# Patient Record
Sex: Female | Born: 1935 | Race: Black or African American | Hispanic: No | Smoking: Never smoker
Health system: Southern US, Community
[De-identification: ages and names within clinical notes are randomized; demographics above are authoritative.]

## PROBLEM LIST (undated history)

## (undated) DIAGNOSIS — M1990 Unspecified osteoarthritis, unspecified site: Secondary | ICD-10-CM

## (undated) DIAGNOSIS — I10 Essential (primary) hypertension: Secondary | ICD-10-CM

## (undated) DIAGNOSIS — F0390 Unspecified dementia without behavioral disturbance: Secondary | ICD-10-CM

## (undated) DIAGNOSIS — N189 Chronic kidney disease, unspecified: Secondary | ICD-10-CM

## (undated) DIAGNOSIS — D649 Anemia, unspecified: Secondary | ICD-10-CM

## (undated) DIAGNOSIS — E785 Hyperlipidemia, unspecified: Secondary | ICD-10-CM

## (undated) DIAGNOSIS — I480 Paroxysmal atrial fibrillation: Secondary | ICD-10-CM

## (undated) DIAGNOSIS — R001 Bradycardia, unspecified: Secondary | ICD-10-CM

## (undated) DIAGNOSIS — S82899A Other fracture of unspecified lower leg, initial encounter for closed fracture: Secondary | ICD-10-CM

## (undated) HISTORY — DX: Hyperlipidemia, unspecified: E78.5

## (undated) HISTORY — DX: Bradycardia, unspecified: R00.1

## (undated) HISTORY — PX: TONSILLECTOMY AND ADENOIDECTOMY: SUR1326

## (undated) HISTORY — DX: Anemia, unspecified: D64.9

## (undated) HISTORY — DX: Paroxysmal atrial fibrillation: I48.0

## (undated) HISTORY — DX: Essential (primary) hypertension: I10

## (undated) HISTORY — PX: HEMORRHOID SURGERY: SHX153

## (undated) HISTORY — DX: Unspecified dementia, unspecified severity, without behavioral disturbance, psychotic disturbance, mood disturbance, and anxiety: F03.90

## (undated) HISTORY — DX: Other fracture of unspecified lower leg, initial encounter for closed fracture: S82.899A

## (undated) HISTORY — DX: Chronic kidney disease, unspecified: N18.9

## (undated) HISTORY — PX: ABDOMINAL HYSTERECTOMY: SHX81

## (undated) HISTORY — PX: FRACTURE SURGERY: SHX138

---

## 2000-09-14 HISTORY — PX: EYE SURGERY: SHX253

## 2006-03-31 ENCOUNTER — Inpatient Hospital Stay (HOSPITAL_COMMUNITY): Admission: AD | Admit: 2006-03-31 | Discharge: 2006-04-05 | Payer: Self-pay | Admitting: Internal Medicine

## 2006-05-22 ENCOUNTER — Ambulatory Visit: Payer: Self-pay | Admitting: Internal Medicine

## 2006-05-22 ENCOUNTER — Inpatient Hospital Stay (HOSPITAL_COMMUNITY): Admission: EM | Admit: 2006-05-22 | Discharge: 2006-06-02 | Payer: Self-pay | Admitting: Emergency Medicine

## 2006-05-23 ENCOUNTER — Encounter: Payer: Self-pay | Admitting: Cardiology

## 2006-05-26 ENCOUNTER — Encounter: Payer: Self-pay | Admitting: Cardiology

## 2006-06-14 ENCOUNTER — Ambulatory Visit: Payer: Self-pay | Admitting: Cardiology

## 2006-06-17 ENCOUNTER — Ambulatory Visit: Payer: Self-pay

## 2006-06-23 ENCOUNTER — Ambulatory Visit: Payer: Self-pay | Admitting: Cardiology

## 2006-07-06 ENCOUNTER — Ambulatory Visit: Payer: Self-pay | Admitting: Internal Medicine

## 2006-07-06 ENCOUNTER — Ambulatory Visit: Payer: Self-pay | Admitting: Cardiology

## 2006-07-14 ENCOUNTER — Ambulatory Visit: Payer: Self-pay | Admitting: Cardiology

## 2006-07-28 ENCOUNTER — Ambulatory Visit: Payer: Self-pay | Admitting: Cardiology

## 2006-08-04 ENCOUNTER — Ambulatory Visit: Payer: Self-pay | Admitting: Cardiology

## 2006-08-12 ENCOUNTER — Ambulatory Visit: Payer: Self-pay | Admitting: Internal Medicine

## 2006-08-18 ENCOUNTER — Ambulatory Visit: Payer: Self-pay | Admitting: Cardiology

## 2006-08-18 LAB — CONVERTED CEMR LAB
BUN: 22 mg/dL (ref 6–23)
CO2: 34 meq/L — ABNORMAL HIGH (ref 19–32)
Calcium: 10 mg/dL (ref 8.4–10.5)
Chloride: 104 meq/L (ref 96–112)
Creatinine, Ser: 1.2 mg/dL (ref 0.4–1.2)
GFR calc Af Amer: 56 mL/min
GFR calc non Af Amer: 46 mL/min
Glucose, Bld: 85 mg/dL (ref 70–99)
Potassium: 3.5 meq/L (ref 3.5–5.1)
Sodium: 143 meq/L (ref 135–145)

## 2006-08-26 ENCOUNTER — Ambulatory Visit: Payer: Self-pay | Admitting: Cardiology

## 2006-09-15 ENCOUNTER — Ambulatory Visit: Payer: Self-pay | Admitting: *Deleted

## 2006-10-13 ENCOUNTER — Ambulatory Visit: Payer: Self-pay | Admitting: Internal Medicine

## 2006-10-27 ENCOUNTER — Ambulatory Visit: Payer: Self-pay | Admitting: *Deleted

## 2006-11-04 ENCOUNTER — Ambulatory Visit: Payer: Self-pay | Admitting: Cardiology

## 2006-11-17 ENCOUNTER — Ambulatory Visit: Payer: Self-pay | Admitting: *Deleted

## 2006-12-15 ENCOUNTER — Ambulatory Visit: Payer: Self-pay | Admitting: Cardiovascular Disease

## 2007-01-12 ENCOUNTER — Ambulatory Visit: Payer: Self-pay | Admitting: Internal Medicine

## 2007-02-09 ENCOUNTER — Ambulatory Visit: Payer: Self-pay | Admitting: Cardiology

## 2007-03-09 ENCOUNTER — Ambulatory Visit: Payer: Self-pay | Admitting: Internal Medicine

## 2007-04-04 ENCOUNTER — Ambulatory Visit: Payer: Self-pay | Admitting: Cardiology

## 2007-04-04 ENCOUNTER — Ambulatory Visit: Payer: Self-pay | Admitting: Internal Medicine

## 2007-05-04 ENCOUNTER — Ambulatory Visit: Payer: Self-pay | Admitting: Cardiology

## 2007-05-18 ENCOUNTER — Ambulatory Visit: Payer: Self-pay | Admitting: Internal Medicine

## 2007-05-31 ENCOUNTER — Ambulatory Visit: Payer: Self-pay | Admitting: Cardiology

## 2007-06-28 ENCOUNTER — Ambulatory Visit: Payer: Self-pay | Admitting: Cardiology

## 2007-07-26 ENCOUNTER — Ambulatory Visit: Payer: Self-pay | Admitting: Cardiology

## 2007-07-26 ENCOUNTER — Ambulatory Visit: Payer: Self-pay | Admitting: Cardiovascular Disease

## 2007-08-16 ENCOUNTER — Ambulatory Visit: Payer: Self-pay | Admitting: Cardiology

## 2007-09-13 ENCOUNTER — Ambulatory Visit: Payer: Self-pay | Admitting: Internal Medicine

## 2007-09-29 ENCOUNTER — Ambulatory Visit: Payer: Self-pay | Admitting: Internal Medicine

## 2007-09-29 LAB — CONVERTED CEMR LAB
BUN: 41 mg/dL — ABNORMAL HIGH (ref 6–23)
CO2: 29 meq/L (ref 19–32)
Calcium: 9.8 mg/dL (ref 8.4–10.5)
Chloride: 102 meq/L (ref 96–112)
Creatinine, Ser: 1.4 mg/dL — ABNORMAL HIGH (ref 0.4–1.2)
GFR calc Af Amer: 46 mL/min
GFR calc non Af Amer: 38 mL/min
Glucose, Bld: 85 mg/dL (ref 70–99)
Potassium: 4 meq/L (ref 3.5–5.1)
Sodium: 140 meq/L (ref 135–145)

## 2007-10-14 ENCOUNTER — Ambulatory Visit: Payer: Self-pay | Admitting: Cardiology

## 2007-11-07 ENCOUNTER — Ambulatory Visit: Payer: Self-pay | Admitting: Cardiology

## 2007-12-05 ENCOUNTER — Ambulatory Visit: Payer: Self-pay | Admitting: Cardiology

## 2007-12-28 DIAGNOSIS — I495 Sick sinus syndrome: Secondary | ICD-10-CM

## 2007-12-28 DIAGNOSIS — F0390 Unspecified dementia without behavioral disturbance: Secondary | ICD-10-CM | POA: Insufficient documentation

## 2007-12-28 DIAGNOSIS — I10 Essential (primary) hypertension: Secondary | ICD-10-CM | POA: Insufficient documentation

## 2007-12-28 DIAGNOSIS — E785 Hyperlipidemia, unspecified: Secondary | ICD-10-CM | POA: Insufficient documentation

## 2007-12-28 DIAGNOSIS — I4891 Unspecified atrial fibrillation: Secondary | ICD-10-CM | POA: Insufficient documentation

## 2007-12-28 DIAGNOSIS — D649 Anemia, unspecified: Secondary | ICD-10-CM | POA: Insufficient documentation

## 2007-12-28 DIAGNOSIS — H409 Unspecified glaucoma: Secondary | ICD-10-CM | POA: Insufficient documentation

## 2007-12-28 DIAGNOSIS — I509 Heart failure, unspecified: Secondary | ICD-10-CM | POA: Insufficient documentation

## 2007-12-28 DIAGNOSIS — I429 Cardiomyopathy, unspecified: Secondary | ICD-10-CM | POA: Insufficient documentation

## 2007-12-28 HISTORY — DX: Sick sinus syndrome: I49.5

## 2007-12-28 HISTORY — DX: Heart failure, unspecified: I50.9

## 2008-01-03 ENCOUNTER — Ambulatory Visit: Payer: Self-pay | Admitting: Cardiology

## 2008-01-17 ENCOUNTER — Ambulatory Visit: Payer: Self-pay | Admitting: Cardiology

## 2008-02-08 ENCOUNTER — Ambulatory Visit: Payer: Self-pay | Admitting: Cardiology

## 2008-03-07 ENCOUNTER — Ambulatory Visit: Payer: Self-pay | Admitting: Internal Medicine

## 2008-03-22 ENCOUNTER — Ambulatory Visit: Payer: Self-pay

## 2008-03-22 ENCOUNTER — Ambulatory Visit: Payer: Self-pay | Admitting: Cardiology

## 2008-03-22 ENCOUNTER — Encounter: Payer: Self-pay | Admitting: Cardiology

## 2008-04-18 ENCOUNTER — Ambulatory Visit: Payer: Self-pay | Admitting: Cardiology

## 2008-05-17 ENCOUNTER — Ambulatory Visit: Payer: Self-pay | Admitting: Internal Medicine

## 2008-05-17 ENCOUNTER — Ambulatory Visit: Payer: Self-pay | Admitting: Cardiology

## 2008-05-17 LAB — CONVERTED CEMR LAB
BUN: 63 mg/dL — ABNORMAL HIGH (ref 6–23)
CO2: 28 meq/L (ref 19–32)
Calcium: 9.8 mg/dL (ref 8.4–10.5)
Chloride: 107 meq/L (ref 96–112)
Creatinine, Ser: 2.3 mg/dL — ABNORMAL HIGH (ref 0.4–1.2)
GFR calc Af Amer: 26 mL/min
GFR calc non Af Amer: 22 mL/min
Glucose, Bld: 115 mg/dL — ABNORMAL HIGH (ref 70–99)
Potassium: 5.4 meq/L — ABNORMAL HIGH (ref 3.5–5.1)
Sodium: 140 meq/L (ref 135–145)

## 2008-05-31 ENCOUNTER — Ambulatory Visit: Payer: Self-pay | Admitting: Cardiology

## 2008-05-31 LAB — CONVERTED CEMR LAB
BUN: 38 mg/dL — ABNORMAL HIGH (ref 6–23)
CO2: 30 meq/L (ref 19–32)
Calcium: 10 mg/dL (ref 8.4–10.5)
Chloride: 104 meq/L (ref 96–112)
Creatinine, Ser: 1.8 mg/dL — ABNORMAL HIGH (ref 0.4–1.2)
GFR calc Af Amer: 35 mL/min
GFR calc non Af Amer: 29 mL/min
Glucose, Bld: 138 mg/dL — ABNORMAL HIGH (ref 70–99)
Potassium: 4.5 meq/L (ref 3.5–5.1)
Sodium: 141 meq/L (ref 135–145)

## 2008-06-21 ENCOUNTER — Ambulatory Visit: Payer: Self-pay | Admitting: Cardiology

## 2008-07-05 ENCOUNTER — Ambulatory Visit: Payer: Self-pay | Admitting: Cardiology

## 2008-07-19 ENCOUNTER — Ambulatory Visit: Payer: Self-pay | Admitting: Cardiology

## 2008-08-16 ENCOUNTER — Ambulatory Visit: Payer: Self-pay | Admitting: Cardiology

## 2008-09-12 ENCOUNTER — Ambulatory Visit: Payer: Self-pay | Admitting: Internal Medicine

## 2008-10-03 ENCOUNTER — Ambulatory Visit: Payer: Self-pay | Admitting: Internal Medicine

## 2008-10-30 ENCOUNTER — Ambulatory Visit: Payer: Self-pay | Admitting: Cardiology

## 2008-10-30 LAB — CONVERTED CEMR LAB
ALT: 23 units/L (ref 0–35)
AST: 21 units/L (ref 0–37)
Albumin: 4.2 g/dL (ref 3.5–5.2)
Alkaline Phosphatase: 54 units/L (ref 39–117)
BUN: 40 mg/dL — ABNORMAL HIGH (ref 6–23)
Bilirubin, Direct: 0.1 mg/dL (ref 0.0–0.3)
CO2: 29 meq/L (ref 19–32)
Calcium: 9.6 mg/dL (ref 8.4–10.5)
Chloride: 101 meq/L (ref 96–112)
Creatinine, Ser: 1.7 mg/dL — ABNORMAL HIGH (ref 0.4–1.2)
GFR calc Af Amer: 37 mL/min
GFR calc non Af Amer: 31 mL/min
Glucose, Bld: 93 mg/dL (ref 70–99)
Potassium: 4.2 meq/L (ref 3.5–5.1)
Sodium: 139 meq/L (ref 135–145)
TSH: 3.21 microintl units/mL (ref 0.35–5.50)
Total Bilirubin: 1.2 mg/dL (ref 0.3–1.2)
Total Protein: 7.2 g/dL (ref 6.0–8.3)

## 2008-11-27 ENCOUNTER — Ambulatory Visit: Payer: Self-pay | Admitting: Internal Medicine

## 2008-12-11 ENCOUNTER — Ambulatory Visit: Payer: Self-pay | Admitting: Cardiology

## 2009-01-01 ENCOUNTER — Ambulatory Visit: Payer: Self-pay | Admitting: Cardiology

## 2009-01-31 ENCOUNTER — Ambulatory Visit: Payer: Self-pay | Admitting: Cardiology

## 2009-01-31 ENCOUNTER — Ambulatory Visit: Payer: Self-pay | Admitting: Internal Medicine

## 2009-02-07 ENCOUNTER — Ambulatory Visit: Payer: Self-pay | Admitting: Cardiovascular Disease

## 2009-02-07 LAB — CONVERTED CEMR LAB
POC INR: 3.1
Protime: 21.1

## 2009-02-11 ENCOUNTER — Encounter: Payer: Self-pay | Admitting: Cardiology

## 2009-02-12 ENCOUNTER — Encounter: Payer: Self-pay | Admitting: *Deleted

## 2009-02-21 ENCOUNTER — Ambulatory Visit: Payer: Self-pay | Admitting: Cardiology

## 2009-02-21 LAB — CONVERTED CEMR LAB
POC INR: 4.4
Protime: 25.5

## 2009-03-07 ENCOUNTER — Ambulatory Visit: Payer: Self-pay | Admitting: Internal Medicine

## 2009-03-07 LAB — CONVERTED CEMR LAB
POC INR: 2
Prothrombin Time: 17.6 s

## 2009-03-20 ENCOUNTER — Encounter: Payer: Self-pay | Admitting: *Deleted

## 2009-03-28 ENCOUNTER — Ambulatory Visit: Payer: Self-pay | Admitting: Cardiology

## 2009-03-28 LAB — CONVERTED CEMR LAB
POC INR: 2.6
Prothrombin Time: 19.6 s

## 2009-04-25 ENCOUNTER — Ambulatory Visit: Payer: Self-pay | Admitting: Internal Medicine

## 2009-04-25 LAB — CONVERTED CEMR LAB: POC INR: 3.2

## 2009-05-03 ENCOUNTER — Ambulatory Visit: Payer: Self-pay | Admitting: Cardiology

## 2009-05-03 ENCOUNTER — Encounter (INDEPENDENT_AMBULATORY_CARE_PROVIDER_SITE_OTHER): Payer: Self-pay | Admitting: *Deleted

## 2009-05-03 LAB — CONVERTED CEMR LAB: POC INR: 3

## 2009-05-16 LAB — CONVERTED CEMR LAB
ALT: 29 units/L (ref 0–35)
AST: 21 units/L (ref 0–37)
Albumin: 4.4 g/dL (ref 3.5–5.2)
Alkaline Phosphatase: 50 units/L (ref 39–117)
BUN: 63 mg/dL — ABNORMAL HIGH (ref 6–23)
Bilirubin, Direct: 0.1 mg/dL (ref 0.0–0.3)
CO2: 27 meq/L (ref 19–32)
Calcium: 9.7 mg/dL (ref 8.4–10.5)
Chloride: 108 meq/L (ref 96–112)
Cholesterol: 141 mg/dL (ref 0–200)
Creatinine, Ser: 2.5 mg/dL — ABNORMAL HIGH (ref 0.4–1.2)
GFR calc non Af Amer: 23.61 mL/min (ref >60)
Glucose, Bld: 105 mg/dL — ABNORMAL HIGH (ref 70–99)
HDL: 83.5 mg/dL (ref >39.00)
LDL Cholesterol: 47 mg/dL (ref 0–99)
Potassium: 4.7 meq/L (ref 3.5–5.1)
Sodium: 140 meq/L (ref 135–145)
Total Bilirubin: 1.1 mg/dL (ref 0.3–1.2)
Total CHOL/HDL Ratio: 2
Total Protein: 7.7 g/dL (ref 6.0–8.3)
Triglycerides: 54 mg/dL (ref 0.0–149.0)
VLDL: 10.8 mg/dL (ref 0.0–40.0)

## 2009-05-22 ENCOUNTER — Ambulatory Visit: Payer: Self-pay | Admitting: Cardiology

## 2009-05-22 LAB — CONVERTED CEMR LAB: POC INR: 3.1

## 2009-05-27 LAB — CONVERTED CEMR LAB
BUN: 59 mg/dL — ABNORMAL HIGH (ref 6–23)
CO2: 26 meq/L (ref 19–32)
Calcium: 10 mg/dL (ref 8.4–10.5)
Chloride: 106 meq/L (ref 96–112)
Creatinine, Ser: 2.5 mg/dL — ABNORMAL HIGH (ref 0.4–1.2)
GFR calc non Af Amer: 23.6 mL/min (ref >60)
Glucose, Bld: 102 mg/dL — ABNORMAL HIGH (ref 70–99)
Potassium: 5.4 meq/L — ABNORMAL HIGH (ref 3.5–5.1)
Sodium: 138 meq/L (ref 135–145)

## 2009-05-28 ENCOUNTER — Ambulatory Visit: Payer: Self-pay | Admitting: Cardiology

## 2009-05-29 LAB — CONVERTED CEMR LAB
BUN: 54 mg/dL — ABNORMAL HIGH (ref 6–23)
CO2: 29 meq/L (ref 19–32)
Calcium: 9.7 mg/dL (ref 8.4–10.5)
Chloride: 107 meq/L (ref 96–112)
Creatinine, Ser: 2.3 mg/dL — ABNORMAL HIGH (ref 0.4–1.2)
GFR calc non Af Amer: 25.99 mL/min (ref >60)
Glucose, Bld: 77 mg/dL (ref 70–99)
Potassium: 4.6 meq/L (ref 3.5–5.1)
Sodium: 141 meq/L (ref 135–145)

## 2009-06-12 ENCOUNTER — Ambulatory Visit: Payer: Self-pay | Admitting: Cardiovascular Disease

## 2009-06-12 ENCOUNTER — Ambulatory Visit: Payer: Self-pay | Admitting: Cardiology

## 2009-06-12 LAB — CONVERTED CEMR LAB: POC INR: 2

## 2009-06-14 LAB — CONVERTED CEMR LAB
BUN: 49 mg/dL — ABNORMAL HIGH (ref 6–23)
CO2: 27 meq/L (ref 19–32)
Calcium: 10 mg/dL (ref 8.4–10.5)
Chloride: 110 meq/L (ref 96–112)
Creatinine, Ser: 2.1 mg/dL — ABNORMAL HIGH (ref 0.4–1.2)
GFR calc non Af Amer: 28.86 mL/min (ref >60)
Glucose, Bld: 94 mg/dL (ref 70–99)
Potassium: 4.3 meq/L (ref 3.5–5.1)
Sodium: 143 meq/L (ref 135–145)

## 2009-07-03 ENCOUNTER — Ambulatory Visit: Payer: Self-pay | Admitting: Internal Medicine

## 2009-07-03 LAB — CONVERTED CEMR LAB: POC INR: 2.5

## 2009-07-31 ENCOUNTER — Ambulatory Visit: Payer: Self-pay | Admitting: Cardiovascular Disease

## 2009-07-31 LAB — CONVERTED CEMR LAB: POC INR: 2.3

## 2009-08-27 ENCOUNTER — Ambulatory Visit: Payer: Self-pay | Admitting: Cardiology

## 2009-08-27 LAB — CONVERTED CEMR LAB: POC INR: 3.4

## 2009-09-17 ENCOUNTER — Ambulatory Visit: Payer: Self-pay | Admitting: Cardiology

## 2009-09-17 LAB — CONVERTED CEMR LAB: POC INR: 2.7

## 2009-10-01 ENCOUNTER — Ambulatory Visit: Payer: Self-pay | Admitting: Cardiology

## 2009-10-16 ENCOUNTER — Ambulatory Visit: Payer: Self-pay | Admitting: Internal Medicine

## 2009-10-16 LAB — CONVERTED CEMR LAB: POC INR: 3.3

## 2009-11-06 ENCOUNTER — Ambulatory Visit: Payer: Self-pay | Admitting: Internal Medicine

## 2009-11-06 LAB — CONVERTED CEMR LAB: POC INR: 2.8

## 2009-12-05 ENCOUNTER — Ambulatory Visit: Payer: Self-pay | Admitting: Cardiology

## 2009-12-05 LAB — CONVERTED CEMR LAB: POC INR: 3.3

## 2009-12-26 ENCOUNTER — Ambulatory Visit: Payer: Self-pay | Admitting: Internal Medicine

## 2009-12-26 LAB — CONVERTED CEMR LAB: POC INR: 3.9

## 2010-01-07 ENCOUNTER — Ambulatory Visit: Payer: Self-pay | Admitting: Cardiovascular Disease

## 2010-01-07 LAB — CONVERTED CEMR LAB: POC INR: 2.7

## 2010-01-20 ENCOUNTER — Ambulatory Visit: Payer: Self-pay | Admitting: Internal Medicine

## 2010-01-20 ENCOUNTER — Telehealth: Payer: Self-pay | Admitting: Cardiology

## 2010-01-20 ENCOUNTER — Ambulatory Visit: Payer: Self-pay | Admitting: Cardiology

## 2010-01-20 LAB — CONVERTED CEMR LAB
ALT: 25 units/L (ref 0–35)
AST: 21 units/L (ref 0–37)
Albumin: 4.4 g/dL (ref 3.5–5.2)
Alkaline Phosphatase: 47 units/L (ref 39–117)
BUN: 74 mg/dL — ABNORMAL HIGH (ref 6–23)
Basophils Absolute: 0 10*3/uL (ref 0.0–0.1)
Basophils Relative: 0.4 % (ref 0.0–3.0)
Bilirubin, Direct: 0.2 mg/dL (ref 0.0–0.3)
CO2: 23 meq/L (ref 19–32)
Calcium: 10.5 mg/dL (ref 8.4–10.5)
Chloride: 100 meq/L (ref 96–112)
Creatinine, Ser: 3.7 mg/dL — ABNORMAL HIGH (ref 0.4–1.2)
Eosinophils Absolute: 0 10*3/uL (ref 0.0–0.7)
Eosinophils Relative: 0.3 % (ref 0.0–5.0)
GFR calc non Af Amer: 15.08 mL/min (ref >60)
Glucose, Bld: 103 mg/dL — ABNORMAL HIGH (ref 70–99)
HCT: 31.3 % — ABNORMAL LOW (ref 36.0–46.0)
Hemoglobin: 10.6 g/dL — ABNORMAL LOW (ref 12.0–15.0)
Lymphocytes Relative: 9.9 % — ABNORMAL LOW (ref 12.0–46.0)
Lymphs Abs: 1 10*3/uL (ref 0.7–4.0)
MCHC: 33.9 g/dL (ref 30.0–36.0)
MCV: 96.1 fL (ref 78.0–100.0)
Monocytes Absolute: 0.7 10*3/uL (ref 0.1–1.0)
Monocytes Relative: 6.6 % (ref 3.0–12.0)
Neutro Abs: 8.2 10*3/uL — ABNORMAL HIGH (ref 1.4–7.7)
Neutrophils Relative %: 82.8 % — ABNORMAL HIGH (ref 43.0–77.0)
POC INR: 4.1
Platelets: 205 10*3/uL (ref 150.0–400.0)
Potassium: 6.3 meq/L (ref 3.5–5.1)
RBC: 3.26 M/uL — ABNORMAL LOW (ref 3.87–5.11)
RDW: 13.8 % (ref 11.5–14.6)
Sodium: 136 meq/L (ref 135–145)
TSH: 3.73 microintl units/mL (ref 0.35–5.50)
Total Bilirubin: 0.8 mg/dL (ref 0.3–1.2)
Total Protein: 6.9 g/dL (ref 6.0–8.3)
WBC: 10 10*3/uL (ref 4.5–10.5)

## 2010-01-21 ENCOUNTER — Inpatient Hospital Stay (HOSPITAL_COMMUNITY): Admission: EM | Admit: 2010-01-21 | Discharge: 2010-01-24 | Payer: Self-pay | Admitting: Emergency Medicine

## 2010-01-21 ENCOUNTER — Ambulatory Visit: Payer: Self-pay | Admitting: Internal Medicine

## 2010-01-27 ENCOUNTER — Telehealth: Payer: Self-pay | Admitting: Cardiology

## 2010-01-28 ENCOUNTER — Telehealth: Payer: Self-pay | Admitting: Cardiology

## 2010-01-30 ENCOUNTER — Encounter: Payer: Self-pay | Admitting: Internal Medicine

## 2010-01-30 ENCOUNTER — Encounter: Payer: Self-pay | Admitting: Cardiology

## 2010-01-30 LAB — CONVERTED CEMR LAB
POC INR: 1.7
Prothrombin Time: 16.7 s

## 2010-02-04 ENCOUNTER — Ambulatory Visit: Payer: Self-pay | Admitting: Internal Medicine

## 2010-02-04 ENCOUNTER — Encounter: Payer: Self-pay | Admitting: Cardiology

## 2010-02-04 LAB — CONVERTED CEMR LAB: POC INR: 2

## 2010-02-18 ENCOUNTER — Encounter: Payer: Self-pay | Admitting: Cardiology

## 2010-02-18 LAB — CONVERTED CEMR LAB
POC INR: 1.9
Prothrombin Time: 19.1 s

## 2010-02-25 ENCOUNTER — Ambulatory Visit: Payer: Self-pay | Admitting: Cardiovascular Disease

## 2010-02-25 LAB — CONVERTED CEMR LAB: POC INR: 2

## 2010-03-11 ENCOUNTER — Encounter: Payer: Self-pay | Admitting: Cardiology

## 2010-03-12 ENCOUNTER — Ambulatory Visit: Payer: Self-pay | Admitting: Cardiology

## 2010-03-12 DIAGNOSIS — N183 Chronic kidney disease, stage 3 unspecified: Secondary | ICD-10-CM | POA: Insufficient documentation

## 2010-03-12 LAB — CONVERTED CEMR LAB: POC INR: 1.7

## 2010-03-16 LAB — CONVERTED CEMR LAB
BUN: 25 mg/dL — ABNORMAL HIGH (ref 6–23)
CO2: 29 meq/L (ref 19–32)
Calcium: 9.7 mg/dL (ref 8.4–10.5)
Chloride: 101 meq/L (ref 96–112)
Creatinine, Ser: 1.1 mg/dL (ref 0.4–1.2)
GFR calc non Af Amer: 58.31 mL/min (ref >60)
Glucose, Bld: 100 mg/dL — ABNORMAL HIGH (ref 70–99)
Potassium: 4 meq/L (ref 3.5–5.1)
Sodium: 138 meq/L (ref 135–145)

## 2010-03-26 ENCOUNTER — Ambulatory Visit: Payer: Self-pay | Admitting: Internal Medicine

## 2010-03-26 LAB — CONVERTED CEMR LAB: POC INR: 2.3

## 2010-03-28 ENCOUNTER — Telehealth: Payer: Self-pay | Admitting: Cardiology

## 2010-03-31 ENCOUNTER — Telehealth: Payer: Self-pay | Admitting: Cardiology

## 2010-04-16 ENCOUNTER — Ambulatory Visit: Payer: Self-pay | Admitting: Cardiology

## 2010-04-16 LAB — CONVERTED CEMR LAB: POC INR: 1.9

## 2010-04-21 ENCOUNTER — Telehealth: Payer: Self-pay | Admitting: Cardiology

## 2010-05-05 ENCOUNTER — Ambulatory Visit: Payer: Self-pay | Admitting: Cardiology

## 2010-05-05 LAB — CONVERTED CEMR LAB: POC INR: 2.4

## 2010-06-02 ENCOUNTER — Ambulatory Visit: Payer: Self-pay | Admitting: Cardiovascular Disease

## 2010-06-02 LAB — CONVERTED CEMR LAB: POC INR: 2.1

## 2010-06-19 ENCOUNTER — Ambulatory Visit: Payer: Self-pay | Admitting: Cardiology

## 2010-06-30 ENCOUNTER — Ambulatory Visit: Payer: Self-pay | Admitting: Cardiology

## 2010-06-30 LAB — CONVERTED CEMR LAB: POC INR: 2.6

## 2010-07-16 ENCOUNTER — Encounter: Payer: Self-pay | Admitting: Cardiology

## 2010-07-29 ENCOUNTER — Ambulatory Visit: Payer: Self-pay | Admitting: Cardiovascular Disease

## 2010-07-29 LAB — CONVERTED CEMR LAB: POC INR: 1.4

## 2010-08-12 ENCOUNTER — Ambulatory Visit: Payer: Self-pay | Admitting: Cardiology

## 2010-08-12 LAB — CONVERTED CEMR LAB: POC INR: 1.6

## 2010-08-21 ENCOUNTER — Ambulatory Visit: Payer: Self-pay | Admitting: Cardiology

## 2010-08-21 ENCOUNTER — Ambulatory Visit: Payer: Self-pay | Admitting: Internal Medicine

## 2010-08-21 LAB — CONVERTED CEMR LAB: POC INR: 1.5

## 2010-09-03 ENCOUNTER — Ambulatory Visit: Payer: Self-pay | Admitting: Cardiology

## 2010-09-03 LAB — CONVERTED CEMR LAB: POC INR: 1.6

## 2010-09-17 ENCOUNTER — Ambulatory Visit: Admission: RE | Admit: 2010-09-17 | Discharge: 2010-09-17 | Payer: Self-pay | Source: Home / Self Care

## 2010-09-17 LAB — CONVERTED CEMR LAB: POC INR: 2.1

## 2010-10-14 NOTE — Assessment & Plan Note (Signed)
Summary: eph 4:00/kfw   Visit Type:  Follow-up Primary Provider:  Dr. Chriss Czar  CC:  HTN and renal insufficiency.  History of Present Illness: The patient presents for followup after a hospitalization with hyperkalemia, acute on chronic renal insufficiency related to dehydration and diarrhea. During that hospitalization multiple medications including Aldactone were discontinued. She was all so watched for atrial fibrillation with a slow ventricular rate. Since going home she has done well. She does note that her blood pressure is starting to creep up. She has been off diuretics and does have some mild lower extremity swelling. She is not describing any chest pressure, neck or arm discomfort. She is not describing any palpitations, presyncope or syncope. She is watching her salt and fluid. She has had no further diarrhea.  Current Medications (verified): 1)  Lovastatin 20 Mg Tabs (Lovastatin) .... Take 1 Tablet By Mouth Once A Day 2)  Norvasc 10 Mg Tabs (Amlodipine Besylate) .... Take One Tablet By Mouth Once Daily. 3)  Aricept Odt 10 Mg Tbdp (Donepezil Hcl) .... Take One Tablet By Mouth Once Daily. 4)  Coumadin 3 Mg Tabs (Warfarin Sodium) .... Take As Directed By Coumadin Clinic 5)  Vitamin D 1000 Unit Tabs (Cholecalciferol) .Marland Kitchen.. 1 By Mouth Daily 6)  Multivitamins   Tabs (Multiple Vitamin) .Marland Kitchen.. 1 By Mouth Daily  Allergies (verified): No Known Drug Allergies  Past History:  Past Medical History:  1. Paroxysmal atrial fibrillation.   2. Malleolar fractures status post open reduction and internal       fixation.   3. Hypertension.   4. Mild dementia.   5. Dyslipidemia.   6. Glaucoma.   7. Anemia.   8. Hysterectomy.   9. Cardiomyopathy (presumed ischemic in the past with an EF of 35%.       The most recent ejection fraction in July 2009, however, was 55-       60%.)   10.Bradycardia.   11. Chronic renal insufficiency  Past Surgical History: Reviewed history from 12/28/2007  and no changes required. Hemorrhoid surgery Hysterectomy  Review of Systems       As stated in the HPI and negative for all other systems.   Vital Signs:  Patient profile:   77 year old female Height:      65 inches Weight:      176 pounds BMI:     29.39 Pulse rate:   59 / minute Resp:     18 per minute BP sitting:   154 / 82  (right arm)  Vitals Entered By: Levora Angel, CNA (March 12, 2010 3:40 PM)  Physical Exam  General:  Well developed, well nourished, in no acute distress. Head:  normocephalic and atraumatic Eyes:  PERRLA/EOM intact; conjunctiva and lids normal. Mouth:  Teeth, gums and palate normal. Oral mucosa normal. Neck:  Neck supple, no JVD. No masses, thyromegaly or abnormal cervical nodes. Chest Wall:  no deformities or breast masses noted Lungs:  Clear bilaterally to auscultation and percussion. Abdomen:  Bowel sounds positive; abdomen soft and non-tender without masses, organomegaly, or hernias noted. No hepatosplenomegaly. Msk:  Back normal, normal gait. Muscle strength and tone normal. Extremities:  No clubbing or cyanosis, mild left greater than right lower extremity edema Neurologic:  Alert and oriented x 3. Skin:  Intact without lesions or rashes. Cervical Nodes:  no significant adenopathy Inguinal Nodes:  no significant adenopathy Psych:  Normal affect.   Detailed Cardiovascular Exam  Neck    Carotids: Carotids full and equal  bilaterally without bruits.      Neck Veins: Normal, no JVD.    Heart    Inspection: no deformities or lifts noted.      Auscultation: S1 and S2 within normal limits, no S3, 2/6 apical systolic murmur radiating slightly up the aortic outflow tract  Vascular    Abdominal Aorta: no palpable masses, pulsations, or audible bruits.      Femoral Pulses: normal femoral pulses bilaterally.      Pedal Pulses: pulses normal in all 4 extremities    Peripheral Circulation: no clubbing, cyanosis, with normal capillary refill.      EKG  Procedure date:  03/12/2010  Findings:      Sinus bradycardia, rate 59, premature ectopic contractions, left axis deviation, interventricular conduction delay, LVH  Impression & Recommendations:  Problem # 1:  HYPERTENSION (ICD-401.9)  Her updated medication list for this problem includes:    Norvasc 10 Mg Tabs (Amlodipine besylate) .Marland Kitchen... Take one tablet by mouth once daily.    Hydralazine Hcl 10 Mg Tabs (Hydralazine hcl) .Marland Kitchen... 1 tab 3 times per day  Problem # 2:  BRADYCARDIA (ICD-427.89)  She is still having no symptoms related to this. No further evaluation is warranted.  Her updated medication list for this problem includes:    Norvasc 10 Mg Tabs (Amlodipine besylate) .Marland Kitchen... Take one tablet by mouth once daily.    Coumadin 3 Mg Tabs (Warfarin sodium) .Marland Kitchen... Take as directed by coumadin clinic  Problem # 3:  RENAL INSUFFICIENCY (ICD-588.9) I will check a basic metabolic profile today.  Problem # 4:  CHF (ICD-428.0) The patient is breathing well but has some symptomatic mild lower extremity edema. She will take Lasix 20 mg once daily for the next 2 days. Orders: TLB-BMP (Basic Metabolic Panel-BMET) (99991111)  Problem # 5:  ATRIAL FIBRILLATION (ICD-427.31) She has had no symptomatic paroxysms and tolerates Coumadin. Orders: EKG w/ Interpretation (93000)  Patient Instructions: 1)  Your physician recommends that you return for lab work in: lab work today..bmet .Marland Kitchenwe will call you with results 2)  Your physician has recommended you make the following change in your medication: take Lasix 20 mg on Thursday and Friday 3)  Your physician recommends that you schedule a follow-up appointment in: 6 weeks Prescriptions: HYDRALAZINE HCL 10 MG TABS (HYDRALAZINE HCL) 1 tab 3 times per day  #100 x 6   Entered by:   Francetta Found, RN, BSN   Authorized by:   Minus Breeding, MD, [employer]   Signed by:   Francetta Found, RN, BSN on 03/12/2010   Method used:   Electronically to         [facility] (retail)       [address]       [place], [place]  [address]       Ph: [phone]       Fax: [phone]   RxID:   [id number]

## 2010-10-14 NOTE — Letter (Signed)
Summary: Medical Expense Verification  Medical Expense Verification   Imported By: Marilynne Drivers 07/17/2010 15:25:37  _____________________________________________________________________  External Attachment:    Type:   Image     Comment:   External Document

## 2010-10-14 NOTE — Medication Information (Signed)
Summary: rov/eac  Anticoagulant Therapy  Managed by: Tula Nakayama, RN, BSN Referring MD: Minus Breeding MD PCP: Dr. Chriss Czar Supervising MD: Lovena Le MD, Carleene Overlie Indication 1: Atrial Fibrillation (ICD-427.31) Lab Used: [facility] Site: [facility] INR POC 2.8 INR RANGE 2 - 3  Dietary changes: no    Health status changes: no    Bleeding/hemorrhagic complications: no    Recent/future hospitalizations: no    Any changes in medication regimen? no    Recent/future dental: no  Any missed doses?: no       Is patient compliant with meds? yes       Allergies: No Known Drug Allergies  Anticoagulation Management History:      The patient is taking warfarin and comes in today for a routine follow up visit.  Positive risk factors for bleeding include an age of 51 years or older and presence of serious comorbidities.  The bleeding index is 'intermediate risk'.  Positive CHADS2 values include History of CHF, History of HTN, and Age > 16 years old.  The start date was 08/04/2006.  Anticoagulation responsible provider: Lovena Le MD, Carleene Overlie.  INR POC: 2.8.  Cuvette Lot#: [id number].  Exp: 12/2010.    Anticoagulation Management Assessment/Plan:      The patient's current anticoagulation dose is Coumadin 3 mg tabs: Take as directed by [facility].  The target INR is 2 - 3.  The next INR is due 12/04/2009.  Anticoagulation instructions were given to patient.  Results were reviewed/authorized by Tula Nakayama, RN, BSN.  She was notified by Tula Nakayama, RN, BSN.         Prior Anticoagulation Instructions: INR 3.3  Do NOT take coumadin today.  Then return to normal dosing schedule of 1/2 tablet on Sunday, Tuesday, and Thursday, and take 1 tablet all other days. Return to clinic in 3 weeks.  Current Anticoagulation Instructions: INR 2.8 Continue 1 pill everyday except 1/2 pill on Tuesdays, Thursdays and Sundays. Reheck in 4 weeks.

## 2010-10-14 NOTE — Medication Information (Signed)
Summary: rov/tm  Anticoagulant Therapy  Managed by: Tula Nakayama, RN, BSN Referring MD: Minus Breeding MD PCP: Dr. Chriss Czar Supervising MD: Lia Foyer MD, Marcello Moores Indication 1: Atrial Fibrillation (ICD-427.31) Lab Used: [facility] Site: [facility] INR POC 1.9 INR RANGE 2 - 3  Vital Signs: Blood Pressure:  180 / 70   Dietary changes: no    Health status changes: no    Bleeding/hemorrhagic complications: no    Recent/future hospitalizations: no    Any changes in medication regimen? no    Recent/future dental: no  Any missed doses?: no       Is patient compliant with meds? yes      Comments: Pt BP elevated at visit today.  Pt states she does check BP at home and it runs high but not this high.  She was taken off lisinopril, furosemide, and spironolactone during hospitalization in May.   Saw Dr. Percival Spanish in June.  BP was elevated at that time.  She does have some swelling.  Discussed with Dr. Lia Foyer.  Will have her write down BPs over the next week and f/u with Dr. Rosezella Florida nurse.  If they are still elevated, will need to make OV.   Pt will take furosemide x1 today to help with swelling.   Allergies: No Known Drug Allergies  Anticoagulation Management History:      The patient is taking warfarin and comes in today for a routine follow up visit.  Positive risk factors for bleeding include an age of 75 years or older and presence of serious comorbidities.  The bleeding index is 'intermediate risk'.  Positive CHADS2 values include History of CHF, History of HTN, and Age > 58 years old.  The start date was 08/04/2006.  Anticoagulation responsible Miklos Bidinger: Lia Foyer MD, Marcello Moores.  INR POC: 1.9.  Cuvette Lot#: [id number].  Exp: 06/2011.    Anticoagulation Management Assessment/Plan:      The patient's current anticoagulation dose is Coumadin 3 mg tabs: Take as directed by [facility] Clinic.  The target INR is 2 - 3.  The next INR is due 05/05/2010.  Anticoagulation instructions were  given to patient/[facility].  Results were reviewed/authorized by Tula Nakayama, RN, BSN.  She was notified by Vassie Loll, PharmD Candidate.         Prior Anticoagulation Instructions: INR 2.3 Continue 1 pill everyday except 1/2 pill on Mondays and Fridays. Recheck in 3 weeks.   Current Anticoagulation Instructions: INR- 1.9  Take 1.5 tablets (4.5mg ) today.  Then continue taking 1 tablet (3 mg) on Sun, Tues, Wed, Thurs, and Sat.  Take 1/2 tablet on Mon and Fri.  Recheck INR in 3 weeks.

## 2010-10-14 NOTE — Medication Information (Signed)
Summary: rov/nb  Anticoagulant Therapy  Managed by: Gypsy Lore, PharmD Referring MD: Minus Breeding MD PCP: Dr. Chriss Czar Supervising MD: Ron Parker MD, Dellis Filbert Indication 1: Atrial Fibrillation (ICD-427.31) Lab Used: [facility] Site: [facility] INR POC 1.6 INR RANGE 2 - 3  Vital Signs: Blood Pressure:  162 / 82   Dietary changes: yes       Details: Maybe ate a little extra greens last week at Thanksgiving dinner.   Health status changes: no    Bleeding/hemorrhagic complications: no    Recent/future hospitalizations: no    Any changes in medication regimen? no    Recent/future dental: no  Any missed doses?: no       Is patient compliant with meds? yes       Allergies: No Known Drug Allergies  Anticoagulation Management History:      The patient is taking warfarin and comes in today for a routine follow up visit.  Positive risk factors for bleeding include an age of 75 years or older and presence of serious comorbidities.  The bleeding index is 'intermediate risk'.  Positive CHADS2 values include History of CHF, History of HTN, and Age > 75 years old.  The start date was 08/04/2006.  Anticoagulation responsible provider: Ron Parker MD, Dellis Filbert.  INR POC: 1.6.  Cuvette Lot#: [id number].  Exp: 08/2011.    Anticoagulation Management Assessment/Plan:      The patient's current anticoagulation dose is Coumadin 3 mg tabs: Take as directed by [facility] Clinic.  The target INR is 2 - 3.  The next INR is due 08/26/2010.  Anticoagulation instructions were given to patient/[facility].  Results were reviewed/authorized by Gypsy Lore, PharmD.  She was notified by Gypsy Lore PharmD.         Prior Anticoagulation Instructions: INR 1.4 Take 2 tablets today and take 1.5 tablets tomorrow then resume previous dose of 1 tablet everyday except 0.5 tablets on Monday and Friday. Recheck INR in 2 weeks  Current Anticoagulation Instructions: INR 1.6  Today, Tuesday, November 29th, take Coumadin 2  tabs (6 mg). Then, take Coumadin 1 tab (3 mg) on all days except for Coumadin 0.5 mg (1.5 mg) on Mondays.  Return to clinic in 1 week to coincide with appt with Dr. Percival Spanish.

## 2010-10-14 NOTE — Progress Notes (Signed)
Summary: Pt calling regarding her swollen legs and taking Lasix  Phone Note Call from Patient Call back at Home Phone [phone]   Caller: Patient Summary of Call: Pt calling regarding her taking Lasix 20 mg legs went down just a little bit. Initial call taken by: Delsa Sale,  March 31, 2010 11:06 AM  Follow-up for Phone Call        spoke with pt, she states she is feeling good, swelling is better and not having SOB.  She wants to know if it is OK for her to be wearing her support hose - pt encoaraged to do so. Follow-up by: Sim Boast, RN,  March 31, 2010 5:56 PM

## 2010-10-14 NOTE — Medication Information (Signed)
Summary: ROV/eac  Anticoagulant Therapy  Managed by: Porfirio Oar, PharmD Referring MD: Minus Breeding MD PCP: Loraine Maple, MD Supervising MD: Angelena Form MD, Harrell Gave Indication 1: Atrial Fibrillation (ICD-427.31) Lab Used: [facility] Site: [facility] INR POC 2.0 INR RANGE 2 - 3  Dietary changes: no    Health status changes: no    Bleeding/hemorrhagic complications: no    Recent/future hospitalizations: no    Any changes in medication regimen? no    Recent/future dental: no  Any missed doses?: no       Is patient compliant with meds? yes       Allergies: No Known Drug Allergies  Anticoagulation Management History:      The patient is taking warfarin and comes in today for a routine follow up visit.  Positive risk factors for bleeding include an age of 27 years or older and presence of serious comorbidities.  The bleeding index is 'intermediate risk'.  Positive CHADS2 values include History of CHF, History of HTN, and Age > 63 years old.  The start date was 08/04/2006.  Anticoagulation responsible Maxden Naji: Angelena Form MD, Harrell Gave.  INR POC: 2.0.  Cuvette Lot#: [id number].  Exp: 07/2010.    Anticoagulation Management Assessment/Plan:      The patient's current anticoagulation dose is Coumadin 3 mg tabs: Take as directed by [facility]madin Clinic.  The target INR is 2 - 3.  The next INR is due 07/03/2009.  Anticoagulation instructions were given to patient.  Results were reviewed/authorized by Porfirio Oar, PharmD.  She was notified by Porfirio Oar PharmD.         Prior Anticoagulation Instructions: INR 3.1  Take 1 tablet (3 mg) on Monday, Wednesday, and Friday, and take 1/2 tablet all other days.    Current Anticoagulation Instructions: INR 2.0  Increase dose to 1 tablet everyday except 1/2 tablet on Sunday, Tuesday and Thursday

## 2010-10-14 NOTE — Assessment & Plan Note (Signed)
Summary: per check out/sf   Visit Type:  Follow-up Primary Provider:  Dr. Chriss Czar  CC:  Atrial fibrillation.  History of Present Illness: The patient resents for evaluation of atrial fibrillation. Today she says she's not feeling as well. She's had a bit of a decreased appetite and some diarrhea. She's had a little more weakness. She's had some upper respiratory mucus production perhaps coming from sinuses. She's not describing fevers or chills. There's been no PND or orthopnea. She's not feeling any palpitations and has had no chest pressure, neck or arm discomfort.  Current Medications (verified): 1)  Furosemide 40 Mg Tabs (Furosemide) .Marland Kitchen.. 1 By Mouth Daily 2)  Amiodarone Hcl 200 Mg Tabs (Amiodarone Hcl) .... Take 1 Tablet By Mouth Once A Day 3)  Lisinopril 40 Mg Tabs (Lisinopril) .... Take 1 Tablet By Mouth Once A Day 4)  Lovastatin 20 Mg Tabs (Lovastatin) .... Take 1 Tablet By Mouth Once A Day 5)  Norvasc 10 Mg Tabs (Amlodipine Besylate) .... Take One Tablet By Mouth Once Daily. 6)  Spironolactone 25 Mg Tabs (Spironolactone) .... Take 1 Tablet By Mouth Once A Day 7)  Klor-Con M20 20 Meq Cr-Tabs (Potassium Chloride Crys Cr) .... Take 1 Tablet By Mouth Once A Day 8)  Aricept Odt 10 Mg Tbdp (Donepezil Hcl) .... Take One Tablet By Mouth Once Daily. 9)  Coumadin 3 Mg Tabs (Warfarin Sodium) .... Take As Directed By Coumadin Clinic 10)  Vitamin D 1000 Unit Tabs (Cholecalciferol) .Marland Kitchen.. 1 By Mouth Daily 11)  Multivitamins   Tabs (Multiple Vitamin) .Marland Kitchen.. 1 By Mouth Daily  Allergies (verified): No Known Drug Allergies  Past History:  Past Medical History: Reviewed history from 01/29/2009 and no changes required.  1. Paroxysmal atrial fibrillation.   2. Malleolar fractures status post open reduction and internal       fixation.   3. Hypertension.   4. Mild dementia.   5. Dyslipidemia.   6. Glaucoma.   7. Anemia.   8. Hysterectomy.   9. Cardiomyopathy (presumed ischemic in the  past with an EF of 35%.       The most recent ejection fraction in July 2009, however, was 55-       60%.)   10.Bradycardia.   Past Surgical History: Reviewed history from 12/28/2007 and no changes required. Hemorrhoid surgery Hysterectomy  Review of Systems       As stated in the HPI and negative for all other systems.   Vital Signs:  Patient profile:   77 year old female Height:      65 inches Weight:      171 pounds BMI:     28.56 Pulse rate:   54 / minute Resp:     16 per minute BP sitting:   124 / 70  (right arm)  Vitals Entered By: Levora Angel, CNA (Jan 20, 2010 11:16 AM)  Physical Exam  General:  Well developed, well nourished, in no acute distress. Head:  normocephalic and atraumatic Eyes:  PERRLA/EOM intact; conjunctiva and lids normal. Mouth:  gums and palate normal. Oral mucosa normal. Neck:  Neck supple, no JVD. No masses, thyromegaly or abnormal cervical nodes. Lungs:  Clear bilaterally to auscultation and percussion. Abdomen:  Bowel sounds positive; abdomen soft and non-tender without masses, organomegaly, or hernias noted. No hepatosplenomegaly. Msk:  Back normal, normal gait. Muscle strength and tone normal. Extremities:  No clubbing or cyanosis. Neurologic:  Alert and oriented x 3. Skin:  Intact without lesions or rashes.  Cervical Nodes:  no significant adenopathy Psych:  Normal affect.   Detailed Cardiovascular Exam  Neck    Carotids: Carotids full and equal bilaterally without bruits.      Neck Veins: Normal, no JVD.    Heart    Inspection: no deformities or lifts noted.      Auscultation: S1 and S2 within normal limits, no S3, 2/6 apical systolic murmur radiating slightly up the aortic outflow tract  Vascular    Abdominal Aorta: no palpable masses, pulsations, or audible bruits.      Femoral Pulses: normal femoral pulses bilaterally.      Pedal Pulses: pulses normal in all 4 extremities    Peripheral Circulation: no clubbing, cyanosis, or  edema noted with normal capillary refill.     EKG  Procedure date:  01/20/2010  Findings:      Sinus bradycardia, rate 50, left axis deviation, left ventricular hypertrophy by voltage criteria, poor anterior R-wave progression, no acute ST-T wave changes, interventricular conduction delay  Impression & Recommendations:  Problem # 1:  BRADYCARDIA (ICD-427.89) Nothing seems to have changed in terms of her EKG, physical exam or rhythm. However, she's had weakness. I will check labs clued thyroid and chemistry and a CBC. Orders: EKG w/ Interpretation (93000)  Problem # 2:  CARDIOMYOPATHY (ICD-425.4) She has no new symptoms and will continue the meds as listed. Orders: EKG w/ Interpretation (93000) TLB-BMP (Basic Metabolic Panel-BMET) (99991111) TLB-CBC Platelet - w/Differential (85025-CBCD) TLB-Hepatic/Liver Function Pnl (80076-HEPATIC) TLB-TSH (Thyroid Stimulating Hormone) (84443-TSH)  Problem # 3:  ATRIAL FIBRILLATION (ICD-427.31) She seems to be maintaining sinus rhythm. I will followup the labs on amiodarone as described. Orders: TLB-BMP (Basic Metabolic Panel-BMET) (99991111) TLB-CBC Platelet - w/Differential (85025-CBCD) TLB-Hepatic/Liver Function Pnl (80076-HEPATIC) TLB-TSH (Thyroid Stimulating Hormone) [id number])  Patient Instructions: 1)  Your physician recommends that you schedule a follow-up appointment with Dr Percival Spanish in:  2 to 3 months 2)  See [facility]madin Clinic 5/24 at 11:15 am 3)  Your physician recommends that you continue on your current medications as directed. Please refer to the Current Medication list given to you today. 4)  You have been diagnosed with atrial fibrillation.  Atrial fibrillation is a condition in which one of the upper chambers of the heart has extra electrical cells causing it to beat very fast.  Please see the handout/brochure given to you today for further information. 5)  Your physician recommends that you have lab work today:  BMP,  CBC, liver profile and TSH.  427.31  V58.69

## 2010-10-14 NOTE — Assessment & Plan Note (Signed)
Summary: per check out/sf   Visit Type:  Follow-up Primary Provider:  Dr. Chriss Czar  CC:  Atrial Fibrillation.  History of Present Illness: The patient presents for followup of paroxysmal atrial fibrillation. She was hospitalized for dehydration renal insufficiency and bradycardia earlier this year. Since recovering from that she has been doing well. She is having no new shortness of breath, PND or orthopnea. She is not noticing palpitations and has had no presyncope or syncope. She's had no chest pain. She has minimal lower extremity swelling. She said none of the symptoms that accompanied her dehydration. No her blood pressure is elevated today in the office (repeat 168/76) she says it's in the 130s at home.  Current Medications (verified): 1)  Lovastatin 20 Mg Tabs (Lovastatin) .... Take 1 Tablet By Mouth Once A Day 2)  Norvasc 10 Mg Tabs (Amlodipine Besylate) .... Take One Tablet By Mouth Once Daily. 3)  Aricept Odt 10 Mg Tbdp (Donepezil Hcl) .... Take One Tablet By Mouth Once Daily. 4)  Coumadin 3 Mg Tabs (Warfarin Sodium) .... Take As Directed By [facility]madin Clinic 5)  Vitamin D 1000 Unit Tabs (Cholecalciferol) .Marland Kitchen.. 1 By Mouth Daily 6)  Multivitamins   Tabs (Multiple Vitamin) .Marland Kitchen.. 1 By Mouth Daily 7)  Hydralazine Hcl 10 Mg Tabs (Hydralazine Hcl) .Marland Kitchen.. 1 Tab 3 Times Per Day 8)  Furosemide 40 Mg Tabs (Furosemide) .Marland Kitchen.. 1 By Mouth As Needed  Allergies (verified): No Known Drug Allergies  Past History:  Past Medical History: Reviewed history from 03/12/2010 and no changes required.  1. Paroxysmal atrial fibrillation.   2. Malleolar fractures status post open reduction and internal       fixation.   3. Hypertension.   4. Mild dementia.   5. Dyslipidemia.   6. Glaucoma.   7. Anemia.   8. Hysterectomy.   9. Cardiomyopathy (presumed ischemic in the past with an EF of 35%.       The most recent ejection fraction in July 2009, however, was 55-       60%.)   10.Bradycardia.   11.  Chronic renal insufficiency  Past Surgical History: Reviewed history from 12/28/2007 and no changes required. Hemorrhoid surgery Hysterectomy  Review of Systems       As stated in the HPI and negative for all other systems.   Vital Signs:  Patient profile:   77 year old female Height:      65 inches Weight:      173 pounds BMI:     28.89 Pulse rate:   56 / minute Resp:     16 per minute BP sitting:   184 / 74  (right arm)  Vitals Entered By: Levora Angel, CNA (June 19, 2010 11:05 AM)  Physical Exam  General:  Well developed, well nourished, in no acute distress. Head:  normocephalic and atraumatic Eyes:  PERRLA/EOM intact; conjunctiva and lids normal. Mouth:  Teeth, gums and palate normal. Oral mucosa normal. Neck:  Neck supple, no JVD. No masses, thyromegaly or abnormal cervical nodes. Chest Wall:  no deformities or breast masses noted Lungs:  Clear bilaterally to auscultation and percussion. Heart:  Non-displaced PMI, chest non-tender; regular rate and rhythm, S1, S2 without murmurs, rubs or gallops. Carotid upstroke normal, no bruit. Normal abdominal aortic size, no bruits. Femorals normal pulses, no bruits. Pedals normal pulses. No edema, no varicosities. Abdomen:  Bowel sounds positive; abdomen soft and non-tender without masses, organomegaly, or hernias noted. No hepatosplenomegaly. Msk:  Back normal, normal gait.  Muscle strength and tone normal. Extremities:  No clubbing or cyanosis, mild left greater than right lower extremity edema Neurologic:  Alert and oriented x 3. Skin:  Intact without lesions or rashes. Cervical Nodes:  no significant adenopathy Psych:  Normal affect.   EKG  Procedure date:  06/19/2010  Findings:      sinus bradycardia, rate 56, left axis deviation, interventricular conduction delay, no acute ST-T wave changes, no change from previous, QT slightly prolonged  Impression & Recommendations:  Problem # 1:  BRADYCARDIA (ICD-427.89) She  has had no further symptomatic bradycardia arrhythmias. No change in therapy is indicated. Orders: EKG w/ Interpretation (93000)  Problem # 2:  HYPERTENSION (ICD-401.9) She does have a CNA who comes 3 times per week. I have given him specific instructions on taking a blood pressure diary and they will let us know the results. She may need adjustment to her medications.  Problem # 3:  CARDIOMYOPATHY (ICD-425.4) She seems to be euvolemic. No further testing is indicated today.  Patient Instructions: 1)  Your physician recommends that you schedule a follow-up appointment in: 2 months with Dr Percival Spanish 2)  Your physician recommends that you continue on your current medications as directed. Please refer to the Current Medication list given to you today. Prescriptions: COUMADIN 3 MG TABS (WARFARIN SODIUM) Take as directed by [facility] Clinic  #30.0 Tablet x 3   Entered by:   Sim Boast, RN   Authorized by:   Minus Breeding, MD, [facility]   Signed by:   Sim Boast, RN on 06/19/2010   Method used:   Electronically to        [id number] (retail)       [address]       [place], [place]  [address]       Ph: [phone]       Fax: [phone]   [id number] LOVASTATIN 20 MG TABS (LOVASTATIN) Take 1 tablet by mouth once a day  #30.0 Tablet x 11   Entered by:   Sim Boast, RN   Authorized by:   Minus Breeding, MD, [facility]   Signed by:   Sim Boast, RN on 06/19/2010   Method used:   Electronically to        [facility] (retail)       [address]       [place], [place]  [address]       Ph: [phone]       Fax: [phone]   RxID:   [id number]

## 2010-10-14 NOTE — Medication Information (Signed)
Summary: rov/sp  Anticoagulant Therapy  Managed by: Porfirio Oar, PharmD Referring MD: Minus Breeding MD PCP: Dr. Chriss Czar Supervising MD: Lia Foyer MD, Marcello Moores Indication 1: Atrial Fibrillation (ICD-427.31) Lab Used: [facility] Site: [facility] INR POC 1.7 INR RANGE 2 - 3  Dietary changes: no    Health status changes: no    Bleeding/hemorrhagic complications: no    Recent/future hospitalizations: no    Any changes in medication regimen? no    Recent/future dental: no  Any missed doses?: no       Is patient compliant with meds? yes       Allergies: No Known Drug Allergies  Anticoagulation Management History:      The patient is taking warfarin and comes in today for a routine follow up visit.  Positive risk factors for bleeding include an age of 75 years or older and presence of serious comorbidities.  The bleeding index is 'intermediate risk'.  Positive CHADS2 values include History of CHF, History of HTN, and Age > 27 years old.  The start date was 08/04/2006.  Anticoagulation responsible provider: Lia Foyer MD, Marcello Moores.  INR POC: 1.7.  Cuvette Lot#: [id number].  Exp: 04/2011.    Anticoagulation Management Assessment/Plan:      The patient's current anticoagulation dose is Coumadin 3 mg tabs: Take as directed by [facility] Clinic.  The target INR is 2 - 3.  The next INR is due 03/26/2010.  Anticoagulation instructions were given to patient/[facility].  Results were reviewed/authorized by Porfirio Oar, PharmD.  She was notified by Porfirio Oar PharmD.         Prior Anticoagulation Instructions: INR 2.0  Continue same dose of 1 tablet every day except 1/2 tablet on Monday, Wednesday and Friday   Current Anticoagulation Instructions: INR 1.7  Increase dose to 1 tablet every day except 1/2 tablet on Monday and Friday.

## 2010-10-14 NOTE — Medication Information (Signed)
Summary: rov/py  Anticoagulant Therapy  Managed by: Margaretha Sheffield, PharmD Referring MD: Minus Breeding MD PCP: Loraine Maple, MD Supervising MD: Lia Foyer MD, Marcello Moores Indication 1: Atrial Fibrillation (ICD-427.31) Lab Used: [facility] Site: [facility] INR POC 3.1 INR RANGE 2 - 3  Dietary changes: no    Health status changes: no    Bleeding/hemorrhagic complications: no    Recent/future hospitalizations: no    Any changes in medication regimen? no    Recent/future dental: no  Any missed doses?: no       Is patient compliant with meds? yes       Current Medications (verified): 1)  Furosemide 40 Mg Tabs (Furosemide) .Marland Kitchen.. 1 By Mouth Daily 2)  Amiodarone Hcl 200 Mg Tabs (Amiodarone Hcl) .... Take 1 Tablet By Mouth Once A Day 3)  Lisinopril 40 Mg Tabs (Lisinopril) .... Take 1 Tablet By Mouth Once A Day 4)  Lovastatin 20 Mg Tabs (Lovastatin) .... Take 1 Tablet By Mouth Once A Day 5)  Norvasc 10 Mg Tabs (Amlodipine Besylate) .... Take One Tablet By Mouth Once Daily. 6)  Spironolactone 25 Mg Tabs (Spironolactone) .... Take 1 Tablet By Mouth Once A Day 7)  Klor-Con M20 20 Meq Cr-Tabs (Potassium Chloride Crys Cr) .... Take 1 Tablet By Mouth Once A Day 8)  Aricept Odt 10 Mg Tbdp (Donepezil Hcl) .... Take One Tablet By Mouth Once Daily. 9)  Coumadin 3 Mg Tabs (Warfarin Sodium) .... Take As Directed By [facility] Clinic  Allergies (verified): No Known Drug Allergies  Anticoagulation Management History:      The patient is taking warfarin and comes in today for a routine follow up visit.  Positive risk factors for bleeding include an age of 52 years or older and presence of serious comorbidities.  The bleeding index is 'intermediate risk'.  Positive CHADS2 values include History of CHF, History of HTN, and Age > 54 years old.  The start date was 08/04/2006.  Anticoagulation responsible provider: Lia Foyer MD, Marcello Moores.  INR POC: 3.1.  Cuvette Lot#: [id number].  Exp: 06/2010.    Anticoagulation  Management Assessment/Plan:      The patient's current anticoagulation dose is Coumadin 3 mg tabs: Take as directed by [facility] Clinic.  The target INR is 2 - 3.  The next INR is due 06/12/2009.  Anticoagulation instructions were given to patient.  Results were reviewed/authorized by Margaretha Sheffield, PharmD.  She was notified by Margaretha Sheffield.         Prior Anticoagulation Instructions: INR 3.0 Take 0.5 tablet today, then continue same dosing:  1 tablet daily except 0.5 tablet on Sundays, Tuesdays and Saturdays.    Current Anticoagulation Instructions: INR 3.1  Take 1 tablet (3 mg) on Monday, Wednesday, and Friday, and take 1/2 tablet all other days.

## 2010-10-14 NOTE — Medication Information (Signed)
Summary: rov/sel  Anticoagulant Therapy  Managed by: Porfirio Oar, PharmD Referring MD: Minus Breeding MD PCP: Dr. Chriss Czar Supervising MD: Burt Knack MD, Legrand Como Indication 1: Atrial Fibrillation (ICD-427.31) Lab Used: [facility] Site: [employer] INR POC 1.4 INR RANGE 2 - 3  Dietary changes: no    Health status changes: no    Bleeding/hemorrhagic complications: no    Recent/future hospitalizations: no    Any changes in medication regimen? no    Recent/future dental: no  Any missed doses?: no       Is patient compliant with meds? yes      Comments: Pt denies any diet changes for medication changes and not missed doses  Allergies: No Known Drug Allergies  Anticoagulation Management History:      The patient is taking warfarin and comes in today for a routine follow up visit.  Positive risk factors for bleeding include an age of 75 years or older and presence of serious comorbidities.  The bleeding index is 'intermediate risk'.  Positive CHADS2 values include History of CHF, History of HTN, and Age > 54 years old.  The start date was 08/04/2006.  Anticoagulation responsible Sion Thane: Burt Knack MD, Legrand Como.  INR POC: 1.4.  Cuvette Lot#: [id number].  Exp: 08/2011.    Anticoagulation Management Assessment/Plan:      The patient's current anticoagulation dose is Coumadin 3 mg tabs: Take as directed by [facility] Clinic.  The target INR is 2 - 3.  The next INR is due 08/12/2010.  Anticoagulation instructions were given to patient/[facility].  Results were reviewed/authorized by Porfirio Oar, PharmD.  She was notified by Carmin Richmond, PharmD Candidate.         Prior Anticoagulation Instructions: INR 2.6  Continue taking 1 tablet everyday except 1/2 tablet on Monday and Friday. Recheck in 4 weeks.   Current Anticoagulation Instructions: INR 1.4 Take 2 tablets today and take 1.5 tablets tomorrow then resume previous dose of 1 tablet everyday except 0.5 tablets on Monday and  Friday. Recheck INR in 2 weeks

## 2010-10-14 NOTE — Medication Information (Signed)
Summary: rov/vb  Anticoagulant Therapy  Managed by: Freddrick March, RN, BSN Referring MD: Minus Breeding MD PCP: Loraine Maple, MD Supervising MD: Ron Parker MD, Dellis Filbert Indication 1: Atrial Fibrillation (ICD-427.31) Lab Used: [place] Site: [employer] INR POC 3.4 INR RANGE 2 - 3  Dietary changes: no    Health status changes: no    Bleeding/hemorrhagic complications: no    Recent/future hospitalizations: no    Any changes in medication regimen? no    Recent/future dental: no  Any missed doses?: no       Is patient compliant with meds? yes       Current Medications (verified): 1)  Furosemide 40 Mg Tabs (Furosemide) .Marland Kitchen.. 1 By Mouth Daily 2)  Amiodarone Hcl 200 Mg Tabs (Amiodarone Hcl) .... Take 1 Tablet By Mouth Once A Day 3)  Lisinopril 40 Mg Tabs (Lisinopril) .... Take 1 Tablet By Mouth Once A Day 4)  Lovastatin 20 Mg Tabs (Lovastatin) .... Take 1 Tablet By Mouth Once A Day 5)  Norvasc 10 Mg Tabs (Amlodipine Besylate) .... Take One Tablet By Mouth Once Daily. 6)  Spironolactone 25 Mg Tabs (Spironolactone) .... Take 1 Tablet By Mouth Once A Day 7)  Klor-Con M20 20 Meq Cr-Tabs (Potassium Chloride Crys Cr) .... Take 1 Tablet By Mouth Once A Day 8)  Aricept Odt 10 Mg Tbdp (Donepezil Hcl) .... Take One Tablet By Mouth Once Daily. 9)  Coumadin 3 Mg Tabs (Warfarin Sodium) .... Take As Directed By [facility]madin Clinic  Allergies (verified): No Known Drug Allergies  Anticoagulation Management History:      The patient is taking warfarin and comes in today for a routine follow up visit.  Positive risk factors for bleeding include an age of 15 years or older and presence of serious comorbidities.  The bleeding index is 'intermediate risk'.  Positive CHADS2 values include History of CHF, History of HTN, and Age > 18 years old.  The start date was 08/04/2006.  Anticoagulation responsible provider: Ron Parker MD, Dellis Filbert.  INR POC: 3.4.  Cuvette Lot#: [id number].  Exp: 10/2010.    Anticoagulation  Management Assessment/Plan:      The patient's current anticoagulation dose is Coumadin 3 mg tabs: Take as directed by [facility] Clinic.  The target INR is 2 - 3.  The next INR is due 09/17/2009.  Anticoagulation instructions were given to patient.  Results were reviewed/authorized by Freddrick March, RN, BSN.  She was notified by Freddrick March RN.         Prior Anticoagulation Instructions: INR: 2.3  Continue the current regimen of 1 tablet daily except 1/2 tablet on Sundays, Tuesdays and Thursdays.  Recheck in 4 weeks.  Current Anticoagulation Instructions: INR 3.4  Skip today's dose of coumadin, then resume same dosage 1 tablet daily except 1/2 tablet on Sundays, Tuesdays, and Thursdays.  Recheck in 3 weeks.

## 2010-10-14 NOTE — Progress Notes (Signed)
Summary: [facility] calling regarding medication  Phone Note Other Incoming   Caller: [place] [phone] Summary of Call: Diane RN calling regarding the pt medications Coumadin ,and just started on Flagyl Initial call taken by: Delsa Sale,  Jan 28, 2010 9:54 AM  Follow-up for Phone Call        Per Porfirio Oar, pt needs pt/inr rechecked, home health will recheck on Thursday and call us with the results. Follow-up by: Sim Boast, RN,  Jan 28, 2010 3:43 PM

## 2010-10-14 NOTE — Medication Information (Signed)
Summary: rov/sp  Anticoagulant Therapy  Managed by: Porfirio Oar, PharmD Referring MD: Minus Breeding MD PCP: Loraine Maple, MD Supervising MD: Haroldine Laws MD, Quillian Quince Indication 1: Atrial Fibrillation (ICD-427.31) Lab Used: [facility] Site: [facility] INR POC 2.5 INR RANGE 2 - 3  Dietary changes: no    Health status changes: no    Bleeding/hemorrhagic complications: no    Recent/future hospitalizations: no    Any changes in medication regimen? no    Recent/future dental: no  Any missed doses?: no       Is patient compliant with meds? yes       Current Medications (verified): 1)  Furosemide 40 Mg Tabs (Furosemide) .Marland Kitchen.. 1 By Mouth Daily 2)  Amiodarone Hcl 200 Mg Tabs (Amiodarone Hcl) .... Take 1 Tablet By Mouth Once A Day 3)  Lisinopril 40 Mg Tabs (Lisinopril) .... Take 1 Tablet By Mouth Once A Day 4)  Lovastatin 20 Mg Tabs (Lovastatin) .... Take 1 Tablet By Mouth Once A Day 5)  Norvasc 10 Mg Tabs (Amlodipine Besylate) .... Take One Tablet By Mouth Once Daily. 6)  Spironolactone 25 Mg Tabs (Spironolactone) .... Take 1 Tablet By Mouth Once A Day 7)  Klor-Con M20 20 Meq Cr-Tabs (Potassium Chloride Crys Cr) .... Take 1 Tablet By Mouth Once A Day 8)  Aricept Odt 10 Mg Tbdp (Donepezil Hcl) .... Take One Tablet By Mouth Once Daily. 9)  Coumadin 3 Mg Tabs (Warfarin Sodium) .... Take As Directed By [facility]madin Clinic  Allergies (verified): No Known Drug Allergies  Anticoagulation Management History:      The patient is taking warfarin and comes in today for a routine follow up visit.  Positive risk factors for bleeding include an age of 75 years or older and presence of serious comorbidities.  The bleeding index is 'intermediate risk'.  Positive CHADS2 values include History of CHF, History of HTN, and Age > 55 years old.  The start date was 08/04/2006.  Anticoagulation responsible Bridgid Printz: Bensimhon MD, Quillian Quince.  INR POC: 2.5.  Cuvette Lot#: [id number].  Exp: 06/2010.    Anticoagulation  Management Assessment/Plan:      The patient's current anticoagulation dose is Coumadin 3 mg tabs: Take as directed by [facility].  The target INR is 2 - 3.  The next INR is due 07/31/2009.  Anticoagulation instructions were given to patient.  Results were reviewed/authorized by Porfirio Oar, PharmD.  She was notified by Audery Amel, PharmD Candidate.         Prior Anticoagulation Instructions: INR 2.0  Increase dose to 1 tablet everyday except 1/2 tablet on Sunday, Tuesday and Thursday  Current Anticoagulation Instructions: INR 2.5  Continue normal dosing schedule of 1/2 tablet on Sundays, Tuesdays, and Thursdays, and 1 tablet on all other days.  Return to clinic in 4 weeks.

## 2010-10-14 NOTE — Medication Information (Signed)
Summary: rov/tm  Anticoagulant Therapy  Managed by: Merdis Delay, RN, BSN Referring MD: Minus Breeding MD PCP: Loraine Maple, MD Supervising MD: Percival Spanish MD, Jeneen Rinks Indication 1: Atrial Fibrillation (ICD-427.31) Lab Used: [facility] Site: [facility] PT 19.6 INR POC 2.6 INR RANGE 2 - 3  Dietary changes: no    Health status changes: no    Bleeding/hemorrhagic complications: no    Recent/future hospitalizations: no    Any changes in medication regimen? no    Recent/future dental: no  Any missed doses?: no       Is patient compliant with meds? yes       Current Medications (verified): 1)  Furosemide 40 Mg Tabs (Furosemide) .Marland Kitchen.. 1 By Mouth Daily 2)  Amiodarone Hcl 200 Mg Tabs (Amiodarone Hcl) .... Take 1 Tablet By Mouth Once A Day 3)  Lisinopril 40 Mg Tabs (Lisinopril) .... Take 1 Tablet By Mouth Once A Day 4)  Lovastatin 20 Mg Tabs (Lovastatin) .... Take 1 Tablet By Mouth Once A Day 5)  Norvasc 10 Mg Tabs (Amlodipine Besylate) .... Take One Tablet By Mouth Once Daily. 6)  Spironolactone 25 Mg Tabs (Spironolactone) .... Take 1 Tablet By Mouth Once A Day 7)  Klor-Con M20 20 Meq Cr-Tabs (Potassium Chloride Crys Cr) .... Take 1 Tablet By Mouth Once A Day 8)  Aricept Odt 10 Mg Tbdp (Donepezil Hcl) .... Take One Tablet By Mouth Once Daily. 9)  Coumadin 3 Mg Tabs (Warfarin Sodium) .... Take As Directed By [facility]mad[facility] Clinic  Allergies (verified): No Known Drug Allergies  Anticoagulation Management History:      The patient is taking warfarin and comes in today for a routine follow up visit.  Positive risk factors for bleeding include an age of 75 years or older and presence of serious comorbidities.  The bleeding index is 'intermediate risk'.  Positive CHADS2 values include History of CHF, History of HTN, and Age > 75 years old.  The start date was 08/04/2006.  Prothrombin time is 19.6.  Anticoagulation responsible provider: Percival Spanish MD, Jeneen Rinks.  INR POC: 2.6.  Cuvette Lot#: [id number].   Exp: 03/2010.    Anticoagulation Management Assessment/Plan:      The patient's current anticoagulation dose is Coumadin 3 mg tabs: Take as directed by [facility].  The target INR is 2 - 3.  The next INR is due 04/25/2009.  Anticoagulation instructions were given to patient.  Results were reviewed/authorized by Merdis Delay, RN, BSN.  She was notified by Merdis Delay, RN, BSN.         Prior Anticoagulation Instructions: INR today 2.0 Take 1pil everyday except 1/2 pill onTuesdays, Saturdays, and Sundays.    Current Anticoagulation Instructions: INR = 2.6   GOAL: 2.0 - 3.0  The patient is to continue with the same dose of coumadin.  This dosage includes:   3mg  every day except 1.5mg  on Sundays, Tuesdays & Saturdays.

## 2010-10-14 NOTE — Progress Notes (Signed)
Summary: questions concerning diet/potassium  Phone Note Call from Patient Call back at Home Phone [phone]   Caller: Patient Reason for Call: Talk to Nurse Summary of Call: Potassium was elevated at admission.  Patient has questions concerning diet.   Initial call taken by: Alexander Bergeron,  Jan 27, 2010 4:43 PM  Follow-up for Phone Call        I spoke with the pt and discussed foods high in potassium that she should avoid in her diet.  The pt is coming into the office for labs and will request a copy of foods high in potassium to avoid. Follow-up by: Theodosia Quay, RN, BSN,  Jan 27, 2010 5:17 PM

## 2010-10-14 NOTE — Medication Information (Signed)
Summary: Coumadin Clinic  Anticoagulant Therapy  Managed by: Tula Nakayama, RN, BSN Referring MD: Minus Breeding MD PCP: Dr. Chriss Czar Supervising MD: Verl Blalock MD, Marcello Moores Indication 1: Atrial Fibrillation (ICD-427.31) Lab Used: [facility] Site: [facility] PT 19.1 INR POC 1.9 INR RANGE 2 - 3  Dietary changes: no    Health status changes: yes       Details: Bil edema in ankles. Pt states she is elevates leg when sitting down.   Bleeding/hemorrhagic complications: no    Recent/future hospitalizations: no    Any changes in medication regimen? no    Recent/future dental: no  Any missed doses?: no       Is patient compliant with meds? yes       Allergies: No Known Drug Allergies  Anticoagulation Management History:      Her anticoagulation is being managed by telephone today.  Positive risk factors for bleeding include an age of 75 years or older and presence of serious comorbidities.  The bleeding index is 'intermediate risk'.  Positive CHADS2 values include History of CHF, History of HTN, and Age > 75 years old.  The start date was 08/04/2006.  Prothrombin time is 19.1.  Anticoagulation responsible provider: Verl Blalock MD, Marcello Moores.  INR POC: 1.9.    Anticoagulation Management Assessment/Plan:      The patient's current anticoagulation dose is Coumadin 3 mg tabs: Take as directed by [facility] Clinic.  The target INR is 2 - 3.  The next INR is due 02/25/2010.  Anticoagulation instructions were given to patient/[facility].  Results were reviewed/authorized by Tula Nakayama, RN, BSN.  She was notified by Tula Nakayama, RN, BSN.         Prior Anticoagulation Instructions: INR 2.0 Start new  dosing schedule, Take 1 tablet on Tuesday, Thursday and Saturday and Sunday and take 1/2 a tablet on MWF. Check INR in 2 weeks. Orders given to [facility] with [facility]. Tula Nakayama, RN, BSN  Current Anticoagulation Instructions: INR 1.9 Today 4.5mg  then resume 3mg s daily except 1.5mg  on MWF. Recheck  in one week. [place] discharging pt today.

## 2010-10-14 NOTE — Assessment & Plan Note (Signed)
Summary: 3 month/dmp  Medications Added AMIODARONE HCL 200 MG TABS (AMIODARONE HCL) Take 1 tablet by mouth once a day LISINOPRIL 40 MG TABS (LISINOPRIL) Take 1 tablet by mouth once a day LOVASTATIN 20 MG TABS (LOVASTATIN) Take 1 tablet by mouth once a day NORVASC 10 MG TABS (AMLODIPINE BESYLATE) Take one tablet by mouth once daily. SPIRONOLACTONE 25 MG TABS (SPIRONOLACTONE) Take 1 tablet by mouth once a day KLOR-CON M20 20 MEQ CR-TABS (POTASSIUM CHLORIDE CRYS CR) Take 1 tablet by mouth once a day ARICEPT ODT 10 MG TBDP (DONEPEZIL HCL) Take one tablet by mouth once daily.      Allergies Added: NKDA  Visit Type:  Follow-up Primary Sugey Trevathan:  Garlic, MD  CC:  Atrial Fibrillatoin and Diastolic HF.  History of Present Illness: The patient returns for a three-month followup. Since I last saw her she has had no new problems. She denies any new shortness of breath, PND or orthopnea. She has had no weight gain or swelling. She denies any palpitations, presyncope or syncope. She has had no chest pain. She takes her blood pressure at home and it is well controlled. She did have labs in February and I reviewed these. Her TSH and liver profile were within normal limits.  Current Medications (verified): 1)  Coumadin 3 Mg Tabs (Warfarin Sodium) 2)  Furosemide 40 Mg Tabs (Furosemide) .Marland Kitchen.. 1 By Mouth Daily 3)  Amiodarone Hcl 200 Mg Tabs (Amiodarone Hcl) .... Take 1 Tablet By Mouth Once A Day 4)  Lisinopril 40 Mg Tabs (Lisinopril) .... Take 1 Tablet By Mouth Once A Day 5)  Lovastatin 20 Mg Tabs (Lovastatin) .... Take 1 Tablet By Mouth Once A Day 6)  Norvasc 10 Mg Tabs (Amlodipine Besylate) .... Take One Tablet By Mouth Once Daily. 7)  Spironolactone 25 Mg Tabs (Spironolactone) .... Take 1 Tablet By Mouth Once A Day 8)  Klor-Con M20 20 Meq Cr-Tabs (Potassium Chloride Crys Cr) .... Take 1 Tablet By Mouth Once A Day 9)  Aricept Odt 10 Mg Tbdp (Donepezil Hcl) .... Take One Tablet By Mouth Once  Daily.  Allergies (verified): No Known Drug Allergies  Past History:  Past Medical History:    Reviewed history from 01/29/2009 and no changes required:     1. Paroxysmal atrial fibrillation.      2. Malleolar fractures status post open reduction and internal          fixation.      3. Hypertension.      4. Mild dementia.      5. Dyslipidemia.      6. Glaucoma.      7. Anemia.      8. Hysterectomy.      9. Cardiomyopathy (presumed ischemic in the past with an EF of 35%.          The most recent ejection fraction in July 2009, however, was 55-          60%.)      10.Bradycardia.   Past Surgical History:    Reviewed history from 12/28/2007 and no changes required:    Hemorrhoid surgery    Hysterectomy  Review of Systems       As stated in the HPI and negative for all other systems.   Vital Signs:  Patient profile:   77 year old female Weight:      193 pounds Pulse rate:   49 / minute BP sitting:   128 / 66  (left arm)  Vitals Entered By:  Jewel Selena Batten CMA (Jan 31, 2009 11:55 AM)  Physical Exam  General:  Well developed, well nourished, in no acute distress. Head:  normocephalic and atraumatic Eyes:  PERRLA/EOM intact; conjunctiva and lids normal. Mouth:  gums and palate normal. Oral mucosa normal. Neck:  Neck supple, no JVD. No masses, thyromegaly or abnormal cervical nodes. Lungs:  Clear bilaterally to auscultation and percussion. Heart:  Non-displaced PMI, chest non-tender; regular rate and rhythm, S1, S2 without murmurs, rubs or gallops. Carotid upstroke normal, no bruit. Normal abdominal aortic size, no bruits. Femorals normal pulses, no bruits. Pedals normal pulses. No edema, no varicosities. Abdomen:  Bowel sounds positive; abdomen soft and non-tender without masses, organomegaly, or hernias noted. No hepatosplenomegaly. Msk:  Back normal, normal gait. Muscle strength and tone normal. Pulses:  pulses normal in all 4 extremities Extremities:  No clubbing or  cyanosis. Neurologic:  Alert and oriented x 3. Skin:  Intact without lesions or rashes. Psych:  Normal affect.   EKG  Procedure date:  01/31/2009  Findings:      Sinus bradycardia, interventricular conduction delay, left axis deviation, no change from previous.  Impression & Recommendations:  Problem # 1:  BRADYCARDIA (ICD-427.89) She tolerates this without difficulty. She maintained sinus rhythm with the amiodarone. We are following the labs appropriately. She understands the need for ophthalmologic exams. No change is indicated. Orders: EKG w/ Interpretation (93000)  Problem # 2:  CHF (ICD-428.0) She he is euvolemic and will remain on the same medications.  Patient Instructions: 1)  Your physician recommends that you schedule a follow-up appointment in: 3 months 2)  Your physician recommends that you continue on your current medications as directed. Please refer to the Current Medication list given to you today. Prescriptions: LOVASTATIN 20 MG TABS (LOVASTATIN) Take 1 tablet by mouth once a day  #30 x 11   Entered by:   Sim Boast, RN   Authorized by:   Minus Breeding, MD, [facility]   Signed by:   Sim Boast, RN on 01/31/2009   Method used:   Electronically to        [facility] (retail)       [address]       [place], [place]  [address]       Ph: [phone]       Fax: [phone]   RxID:   [id number]

## 2010-10-14 NOTE — Miscellaneous (Signed)
  Clinical Lists Changes  Observations: Added new observation of RENAL USOUND:  Right Kidney:  9.7 cm.  Multiple cysts with the largest measuring   4.4 cm.  Cyst appears anechoic with some septations that appears   thin.    Left Kidney:  9.6 cm.  Multiple anechoic simple cysts with the   largest measuring 4.8 cm.  Upper polar caliectasis versus renal   sinus cyst.    Bladder:  Urinary bladder within normal limits. Bladder volume is   150 ml.    IMPRESSION:   Multiple bilateral renal cysts, multiple bilateral renal cysts.   Largest right renal cyst measures 4.4 cm with thin septations.  No   specific follow-up recommended based on the appearance. No evidence   of obstruction.  (01/22/2010 11:12)      Renal US  Procedure date:  01/22/2010  Findings:       Right Kidney:  9.7 cm.  Multiple cysts with the largest measuring   4.4 cm.  Cyst appears anechoic with some septations that appears   thin.    Left Kidney:  9.6 cm.  Multiple anechoic simple cysts with the   largest measuring 4.8 cm.  Upper polar caliectasis versus renal   sinus cyst.    Bladder:  Urinary bladder within normal limits. Bladder volume is   150 ml.    IMPRESSION:   Multiple bilateral renal cysts, multiple bilateral renal cysts.   Largest right renal cyst measures 4.4 cm with thin septations.  No   specific follow-up recommended based on the appearance. No evidence   of obstruction.

## 2010-10-14 NOTE — Medication Information (Signed)
Summary: rov/cb  Anticoagulant Therapy  Managed by: Freddrick March, RN, BSN Referring MD: Minus Breeding MD PCP: Dr. Chriss Czar Supervising MD: Johnsie Cancel MD, Collier Salina Indication 1: Atrial Fibrillation (ICD-427.31) Lab Used: [facility] Site: [facility] INR POC 2.7 INR RANGE 2 - 3  Dietary changes: no    Health status changes: no    Bleeding/hemorrhagic complications: no    Recent/future hospitalizations: no    Any changes in medication regimen? no    Recent/future dental: no  Any missed doses?: no       Is patient compliant with meds? yes       Allergies (verified): No Known Drug Allergies  Anticoagulation Management History:      The patient is taking warfarin and comes in today for a routine follow up visit.  Positive risk factors for bleeding include an age of 75 years or older and presence of serious comorbidities.  The bleeding index is 'intermediate risk'.  Positive CHADS2 values include History of CHF, History of HTN, and Age > 80 years old.  The start date was 08/04/2006.  Anticoagulation responsible provider: Johnsie Cancel MD, Collier Salina.  INR POC: 2.7.  Cuvette Lot#: [id number].  Exp: 02/2011.    Anticoagulation Management Assessment/Plan:      The patient's current anticoagulation dose is Coumadin 3 mg tabs: Take as directed by [facility].  The target INR is 2 - 3.  The next INR is due 01/20/2010.  Anticoagulation instructions were given to patient.  Results were reviewed/authorized by Freddrick March, RN, BSN.  She was notified by Freddrick March RN.         Prior Anticoagulation Instructions: INR 3.9. Hold Coumadin today, then take 0.5 tablet daily except 1 tablet Tues, Thurs, Sat.  Recheck in 7-10 days.  Current Anticoagulation Instructions: INR 2.7  Continue on same dosage 1/2 tablet daily except 1 tablet on Tuesdays, Thursdays, and Saturdays.  Recheck in 3 weeks.

## 2010-10-14 NOTE — Medication Information (Signed)
Summary: rov/sl  Anticoagulant Therapy  Managed by: Tula Nakayama, RN, BSN Referring MD: Minus Breeding MD PCP: Dr. Chriss Czar Supervising MD: Caryl Comes MD, Remo Lipps Indication 1: Atrial Fibrillation (ICD-427.31) Lab Used: [place] Site: [facility] INR POC 1.5 INR RANGE 2 - 3  Dietary changes: no    Health status changes: no    Bleeding/hemorrhagic complications: no    Recent/future hospitalizations: no    Any changes in medication regimen? no    Recent/future dental: no  Any missed doses?: no       Is patient compliant with meds? yes      Comments: Seeing Dr Percival Spanish today.   Allergies: No Known Drug Allergies  Anticoagulation Management History:      The patient is taking warfarin and comes in today for a routine follow up visit.  Positive risk factors for bleeding include an age of 75 years or older and presence of serious comorbidities.  The bleeding index is 'intermediate risk'.  Positive CHADS2 values include History of CHF, History of HTN, and Age > 75 years old.  The start date was 08/04/2006.  Anticoagulation responsible provider: Caryl Comes MD, Remo Lipps.  INR POC: 1.5.  Cuvette Lot#: [id number].  Exp: 10/2010.    Anticoagulation Management Assessment/Plan:      The patient's current anticoagulation dose is Coumadin 3 mg tabs: Take as directed by [facility].  The target INR is 2 - 3.  The next INR is due 09/03/2010.  Anticoagulation instructions were given to patient.  Results were reviewed/authorized by Tula Nakayama, RN, BSN.  She was notified by Tula Nakayama, RN, BSN.         Prior Anticoagulation Instructions: INR 1.6  Today, Tuesday, November 29th, take Coumadin 2 tabs (6 mg). Then, take Coumadin 1 tab (3 mg) on all days except for Coumadin 0.5 mg (1.5 mg) on Mondays.  Return to clinic in 1 week to coincide with appt with Dr. Percival Spanish.   Current Anticoagulation Instructions: INR 1.5 Today take 1 1/2 pills then change dose to 1 pill everyday. Recheck in 2  weeks.

## 2010-10-14 NOTE — Progress Notes (Signed)
Summary: legs are still swollen  Phone Note Call from Patient Call back at Home Phone [phone]   Caller: Patient Reason for Call: Talk to Nurse, Talk to Doctor Summary of Call: pt followed the instructions regarding lasix but her legs are still swollen what can she do now Initial call taken by: Shelda Pal,  March 28, 2010 12:07 PM  Follow-up for Phone Call        pt states she was told at her last office visit to take Lasix 20 mg for 2 days only for her leg swelling and that helped but now swelling is back.  Pt is going to take Lasix 20mg  for 2 days and call back on Monday if no improvement.  She will return for BMP. Follow-up by: Sim Boast, RN,  March 28, 2010 12:38 PM

## 2010-10-14 NOTE — Medication Information (Signed)
Summary: rov/tm  Anticoagulant Therapy  Managed by: Porfirio Oar, PharmD Referring MD: Minus Breeding MD PCP: Dr. Chriss Czar Supervising MD: Haroldine Laws MD, Quillian Quince Indication 1: Atrial Fibrillation (ICD-427.31) Lab Used: [facility] Site: [facility] INR POC 2.0 INR RANGE 2 - 3  Dietary changes: no    Health status changes: yes       Details: Patient at [facility] for high potassium, kidney function,low heart rate discharged on 01/24/10  Bleeding/hemorrhagic complications: no    Recent/future hospitalizations: yes       Details: Patient admitted to [facility] , see note above  Any changes in medication regimen? yes       Details: Patient was discharged on metronidazole for 10 days , two doses to complete. stopped medication seee updated list.  Recent/future dental: no  Any missed doses?: no       Is patient compliant with meds? yes      Comments: Presently has [facility] in home.   Current Medications (verified): 1)  Lovastatin 20 Mg Tabs (Lovastatin) .... Take 1 Tablet By Mouth Once A Day 2)  Norvasc 10 Mg Tabs (Amlodipine Besylate) .... Take One Tablet By Mouth Once Daily. 3)  Aricept Odt 10 Mg Tbdp (Donepezil Hcl) .... Take One Tablet By Mouth Once Daily. 4)  Coumadin 3 Mg Tabs (Warfarin Sodium) .... Take As Directed By Coumadin Clinic 5)  Vitamin D 1000 Unit Tabs (Cholecalciferol) .Marland Kitchen.. 1 By Mouth Daily 6)  Multivitamins   Tabs (Multiple Vitamin) .Marland Kitchen.. 1 By Mouth Daily  Allergies: No Known Drug Allergies  Anticoagulation Management History:      The patient is taking warfarin and comes in today for a routine follow up visit.  Positive risk factors for bleeding include an age of 75 years or older and presence of serious comorbidities.  The bleeding index is 'intermediate risk'.  Positive CHADS2 values include History of CHF, History of HTN, and Age > 17 years old.  The start date was 08/04/2006.  Anticoagulation responsible provider: Bensimhon MD, Quillian Quince.  INR POC: 2.0.  Cuvette Lot#:  [id number].  Exp: 04/2011.    Anticoagulation Management Assessment/Plan:      The patient's current anticoagulation dose is Coumadin 3 mg tabs: Take as directed by [facility] Clinic.  The target INR is 2 - 3.  The next INR is due 02/18/2010.  Anticoagulation instructions were given to patient/HH nurse.  Results were reviewed/authorized by Porfirio Oar, PharmD.  She was notified by Donnie Mesa  B Pharm.         Prior Anticoagulation Instructions: INR 1.7 Today 4.5mg  then resume 1.5mg  daily except 3mg s Tues, Thurs, and Sat. Orders given to [facility] with [facility] to recheck in 12 days on 02/11/10 with results to [place]. Tula Nakayama, RN, BSN  Jan 30, 2010 2:29 PM   Current Anticoagulation Instructions: INR 2.0 Start new  dosing schedule, Take 1 tablet on Tuesday, Thursday and Saturday and Sunday and take 1/2 a tablet on MWF. Check INR in 2 weeks. Orders given to [facility] with [facility]. Tula Nakayama, RN, BSN

## 2010-10-14 NOTE — Assessment & Plan Note (Signed)
Summary: 2 month rov 401.1 428.22  pfh,rn   Visit Type:  Follow-up Primary [name]:  Dr. Chriss Czar  CC:  Cardiomyopathy.  History of Present Illness: The patient presents for followup of her cardiomyopathy. Since I last saw her she has done well. He has had no shortness of breath, PND or orthopnea. She has had no palpitations, presyncope or syncope. She has had no evidence of volume overload that she had previously with diastolic heart failure or her bradycardia.  Current Medications (verified): 1)  Lovastatin 20 Mg Tabs (Lovastatin) .... Take 1 Tablet By Mouth Once A Day 2)  Norvasc 10 Mg Tabs (Amlodipine Besylate) .... Take One Tablet By Mouth Once Daily. 3)  Aricept Odt 10 Mg Tbdp (Donepezil Hcl) .... Take One Tablet By Mouth Once Daily. 4)  Coumadin 3 Mg Tabs (Warfarin Sodium) .... Take As Directed By Coumadin Clinic 5)  Vitamin D 1000 Unit Tabs (Cholecalciferol) .Marland Kitchen.. 1 By Mouth Daily 6)  Multivitamins   Tabs (Multiple Vitamin) .Marland Kitchen.. 1 By Mouth Daily 7)  Hydralazine Hcl 10 Mg Tabs (Hydralazine Hcl) .Marland Kitchen.. 1 Tab 3 Times Per Day 8)  Furosemide 40 Mg Tabs (Furosemide) .Marland Kitchen.. 1 By Mouth As Needed  Allergies (verified): No Known Drug Allergies  Past History:  Past Medical History: Reviewed history from 03/12/2010 and no changes required.  1. Paroxysmal atrial fibrillation.   2. Malleolar fractures status post open reduction and internal       fixation.   3. Hypertension.   4. Mild dementia.   5. Dyslipidemia.   6. Glaucoma.   7. Anemia.   8. Hysterectomy.   9. Cardiomyopathy (presumed ischemic in the past with an EF of 35%.       The most recent ejection fraction in July 2009, however, was 55-       60%.)   10.Bradycardia.   11. Chronic renal insufficiency  Past Surgical History: Reviewed history from 12/28/2007 and no changes required. Hemorrhoid surgery Hysterectomy  Review of Systems       As stated in the HPI and negative for all other systems.   Vital  Signs:  Patient profile:   77 year old female Height:      65 inches Weight:      176 pounds BMI:     29.39 Pulse rate:   54 / minute Resp:     16 per minute BP sitting:   175 / 72  (left arm)  Vitals Entered By: Levora Angel, CNA (August 21, 2010 11:06 AM)  Physical Exam  General:  Well developed, well nourished, in no acute distress. Head:  normocephalic and atraumatic Neck:  Neck supple, no JVD. No masses, thyromegaly or abnormal cervical nodes. Chest Wall:  no deformities or breast masses noted Lungs:  Clear bilaterally to auscultation and percussion. Abdomen:  Bowel sounds positive; abdomen soft and non-tender without masses, organomegaly, or hernias noted. No hepatosplenomegaly. Msk:  Back normal, normal gait. Muscle strength and tone normal. Extremities:  No clubbing or cyanosis, mild left greater than right lower extremity edema Neurologic:  Alert and oriented x 3. Skin:  Intact without lesions or rashes. Cervical Nodes:  no significant adenopathy Inguinal Nodes:  no significant adenopathy Psych:  Normal affect.   Detailed Cardiovascular Exam  Neck    Carotids: Carotids full and equal bilaterally without bruits.      Neck Veins: Normal, no JVD.    Heart    Inspection: no deformities or lifts noted.  Auscultation: S1 and S2 within normal limits, no S3, 2/6 apical systolic murmur radiating slightly up the aortic outflow tract  Vascular    Abdominal Aorta: no palpable masses, pulsations, or audible bruits.      Femoral Pulses: normal femoral pulses bilaterally.      Pedal Pulses: pulses normal in all 4 extremities    Peripheral Circulation: no clubbing, cyanosis, with normal capillary refill.     Impression & Recommendations:  Problem # 1:  BRADYCARDIA (ICD-427.89) She has had no symptomatic bradycardia arrhythmias. No change in therapy is indicated.  Problem # 2:  CARDIOMYOPATHY (ICD-425.4) She seems to be euvolemic. She will continue with meds as  listed.  Problem # 3:  HYPERTENSION (ICD-401.9) Her blood pressure is elevated here but typically isn't at home. She brought a blood pressure diary and her blood pressure cuff to correlate. I will therefore make no titration of her medications.  Problem # 4:  ATRIAL FIBRILLATION (ICD-427.31) R. Coumadin has been somewhat difficult to control being subtherapeutic recently I do not think Pradaxa is a good choice secondary to her previous renal insufficiency.  Patient Instructions: 1)  Your physician recommends that you schedule a follow-up appointment in: 2 months with Dr Percival Spanish 2)  Your physician recommends that you continue on your current medications as directed. Please refer to the Current Medication list given to you today.

## 2010-10-14 NOTE — Medication Information (Signed)
Summary: rov/tm  Anticoagulant Therapy  Managed by: Margaretha Sheffield, PharmD Referring MD: Minus Breeding MD PCP: Dr. Chriss Czar Supervising MD: Verl Blalock MD, Marcello Moores Indication 1: Atrial Fibrillation (ICD-427.31) Lab Used: [facility] Site: [facility] INR POC 3.3 INR RANGE 2 - 3  Dietary changes: no    Health status changes: no    Bleeding/hemorrhagic complications: no    Recent/future hospitalizations: no    Any changes in medication regimen? no    Recent/future dental: no  Any missed doses?: no       Is patient compliant with meds? yes       Allergies: No Known Drug Allergies  Anticoagulation Management History:      The patient is taking warfarin and comes in today for a routine follow up visit.  Positive risk factors for bleeding include an age of 87 years or older and presence of serious comorbidities.  The bleeding index is 'intermediate risk'.  Positive CHADS2 values include History of CHF, History of HTN, and Age > 36 years old.  The start date was 08/04/2006.  Anticoagulation responsible provider: Verl Blalock MD, Marcello Moores.  INR POC: 3.3.  Cuvette Lot#: [id number].  Exp: 01/2011.    Anticoagulation Management Assessment/Plan:      The patient's current anticoagulation dose is Coumadin 3 mg tabs: Take as directed by Coumadin Clinic.  The target INR is 2 - 3.  The next INR is due 12/26/2009.  Anticoagulation instructions were given to patient.  Results were reviewed/authorized by Margaretha Sheffield, PharmD.  She was notified by Margaretha Sheffield.         Prior Anticoagulation Instructions: INR 2.8 Continue 1 pill everyday except 1/2 pill on Tuesdays, Thursdays and Sundays. Reheck in 4 weeks.   Current Anticoagulation Instructions: INR 3.3  Do NOT take coumadin today.  Then return to normal dosing schedule of 1/2 tablet on Sunday, Tuesday, and Thursday, and take 1 tablet all other days.  Return to clinic in  3 weeks.

## 2010-10-14 NOTE — Assessment & Plan Note (Signed)
Summary: 3 month rov/sl   Visit Type:  Follow-up Primary Provider:  Garlic, MD  CC:  PAF/Heart Failure.  History of Present Illness: The patient presents for evaluation of the above. Since I last saw her she has had no new cardiovascular complaints. She is getting along doing a little bit of walking. With this level of activity she denies any new cardiovascular symptoms such as shortness of breath. She has no chest pressure, neck or arm discomfort. She is not having any resting symptoms such as PND or orthopnea. She has no palpitations, presyncope or syncope. She has been dieting and has lost a few pounds!  Current Medications (verified): 1)  Furosemide 40 Mg Tabs (Furosemide) .Marland Kitchen.. 1 By Mouth Daily 2)  Amiodarone Hcl 200 Mg Tabs (Amiodarone Hcl) .... Take 1 Tablet By Mouth Once A Day 3)  Lisinopril 40 Mg Tabs (Lisinopril) .... Take 1 Tablet By Mouth Once A Day 4)  Lovastatin 20 Mg Tabs (Lovastatin) .... Take 1 Tablet By Mouth Once A Day 5)  Norvasc 10 Mg Tabs (Amlodipine Besylate) .... Take One Tablet By Mouth Once Daily. 6)  Spironolactone 25 Mg Tabs (Spironolactone) .... Take 1 Tablet By Mouth Once A Day 7)  Klor-Con M20 20 Meq Cr-Tabs (Potassium Chloride Crys Cr) .... Take 1 Tablet By Mouth Once A Day 8)  Aricept Odt 10 Mg Tbdp (Donepezil Hcl) .... Take One Tablet By Mouth Once Daily. 9)  Coumadin 3 Mg Tabs (Warfarin Sodium) .... Take As Directed By [facility]  Allergies (verified): No Known Drug Allergies  Past History:  Past Medical History: Reviewed history from 01/29/2009 and no changes required.  1. Paroxysmal atrial fibrillation.   2. Malleolar fractures status post open reduction and internal       fixation.   3. Hypertension.   4. Mild dementia.   5. Dyslipidemia.   6. Glaucoma.   7. Anemia.   8. Hysterectomy.   9. Cardiomyopathy (presumed ischemic in the past with an EF of 35%.       The most recent ejection fraction in July 2009, however, was 55-       60%.)     10.Bradycardia.   Review of Systems       As stated in the HPI and negative for all other systems.   Vital Signs:  Patient profile:   77 year old female Height:      65 inches Weight:      187 pounds Pulse rate:   46 / minute Resp:     16 per minute BP sitting:   146 / 57  (right arm)  Vitals Entered By: Levora Angel, CNA (May 03, 2009 11:59 AM)  Physical Exam  General:  Well developed, well nourished, in no acute distress. Head:  normocephalic and atraumatic Eyes:  PERRLA/EOM intact; conjunctiva and lids normal. Mouth:  gums and palate normal. Oral mucosa normal. Neck:  Neck supple, no JVD. No masses, thyromegaly or abnormal cervical nodes. Heart:  Non-displaced PMI, chest non-tender; regular rate and rhythm, S1, S2 without murmurs, rubs or gallops. Carotid upstroke normal, no bruit. Normal abdominal aortic size, no bruits. Femorals normal pulses, no bruits. Pedals normal pulses. No edema, no varicosities. Abdomen:  Bowel sounds positive; abdomen soft and non-tender without masses, organomegaly, or hernias noted. No hepatosplenomegaly. Msk:  Back normal, normal gait. Muscle strength and tone normal. Pulses:  pulses normal in all 4 extremities Extremities:  No clubbing or cyanosis. Neurologic:  Alert and oriented x 3. Skin:  Intact without lesions or rashes. Psych:  Normal affect.   EKG  Procedure date:  05/03/2009  Findings:      sinus bradycardia, rate 46, interventricular conduction delay, left axis deviation, no change from previous  Impression & Recommendations:  Problem # 1:  CHF (ICD-428.0) The patient has no shortness of breath. I don't have any reason to suspect her ejection fraction is lower than the most recent 60%. She will continue with the meds as listed.  Problem # 2:  HYPERTENSION (ICD-401.9) Her   Problem # 3:  ATRIAL FIBRILLATION (ICD-427.31) She he is maintaining sinus rhythm. She will remain on the amiodarone. She understands the need for  ophthalmologic exams. I will obtain a TSH, liver profile and be met today.

## 2010-10-14 NOTE — Medication Information (Signed)
Summary: [facility]  Anticoagulant Therapy  Managed by: Tula Nakayama, RN, BSN Referring MD: Minus Breeding MD PCP: Dr. Chriss Czar Supervising MD: Lovena Le MD, Carleene Overlie Indication 1: Atrial Fibrillation (ICD-427.31) Lab Used: [facility] Site: [facility] PT 16.7 INR POC 1.7 INR RANGE 2 - 3  Dietary changes: no    Health status changes: no    Bleeding/hemorrhagic complications: no    Recent/future hospitalizations: no    Any changes in medication regimen? no    Recent/future dental: no  Any missed doses?: no       Is patient compliant with meds? yes       Allergies: No Known Drug Allergies  Anticoagulation Management History:      Her anticoagulation is being managed by telephone today.  Positive risk factors for bleeding include an age of 75 years or older and presence of serious comorbidities.  The bleeding index is 'intermediate risk'.  Positive CHADS2 values include History of CHF, History of HTN, and Age > 75 years old.  The start date was 08/04/2006.  Prothrombin time is 16.7.  Anticoagulation responsible provider: Lovena Le MD, Carleene Overlie.  INR POC: 1.7.    Anticoagulation Management Assessment/Plan:      The patient's current anticoagulation dose is Coumadin 3 mg tabs: Take as directed by [facility].  The target INR is 2 - 3.  The next INR is due 02/11/2010.  Anticoagulation instructions were given to patient/HH nurse.  Results were reviewed/authorized by Tula Nakayama, RN, BSN.  She was notified by Tula Nakayama, RN, BSN.         Prior Anticoagulation Instructions: INR 4.1 Skip today's dose and on Tuesday take 1/2 pill, then resume 1/2 pill everyday except 1 pill on Tuesdays, Thursdays and Saturdays. Recheck in 2 weeks.   Current Anticoagulation Instructions: INR 1.7 Today 4.5mg  then resume 1.5mg  daily except 3mg s Tues, Thurs, and Sat. Orders given to [facility] with [facility] to recheck in 12 days on 02/11/10 with results to [place]. Tula Nakayama, RN, BSN  Jan 30, 2010  2:29 PM

## 2010-10-14 NOTE — Assessment & Plan Note (Signed)
Summary: per check out/sf   Visit Type:  Follow-up Primary Provider:  Dr. Chriss Czar  CC:  Diastolic HF.  History of Present Illness: The patient presents for routine followup. Since I last saw her she has had no further artery a vascular complaints. She keeps a blood pressure diary at home and her blood pressures are consistently in the systolics Q000111Q and diastolics Q000111Q. She has had no new shortness of breath, PND or orthopnea. She has had no new chest pressure, neck or arm discomfort. She has had no palpitations, presyncope or syncope. She has some chronic mild lower extremity swelling for which she wears compression stockings.  Current Medications (verified): 1)  Lovastatin 20 Mg Tabs (Lovastatin) .... Take 1 Tablet By Mouth Once A Day 2)  Norvasc 10 Mg Tabs (Amlodipine Besylate) .... Take One Tablet By Mouth Once Daily. 3)  Aricept Odt 10 Mg Tbdp (Donepezil Hcl) .... Take One Tablet By Mouth Once Daily. 4)  Coumadin 3 Mg Tabs (Warfarin Sodium) .... Take As Directed By [facility] 5)  Vitamin D 1000 Unit Tabs (Cholecalciferol) .Marland Kitchen.. 1 By Mouth Daily 6)  Multivitamins   Tabs (Multiple Vitamin) .Marland Kitchen.. 1 By Mouth Daily 7)  Hydralazine Hcl 10 Mg Tabs (Hydralazine Hcl) .Marland Kitchen.. 1 Tab 3 Times Per Day 8)  Furosemide 40 Mg Tabs (Furosemide) .Marland Kitchen.. 1 By Mouth As Needed  Allergies (verified): No Known Drug Allergies  Past History:  Past Medical History: Reviewed history from 03/12/2010 and no changes required.  1. Paroxysmal atrial fibrillation.   2. Malleolar fractures status post open reduction and internal       fixation.   3. Hypertension.   4. Mild dementia.   5. Dyslipidemia.   6. Glaucoma.   7. Anemia.   8. Hysterectomy.   9. Cardiomyopathy (presumed ischemic in the past with an EF of 35%.       The most recent ejection fraction in July 2009, however, was 55-       60%.)   10.Bradycardia.   11. Chronic renal insufficiency  Past Surgical History: Reviewed history from  12/28/2007 and no changes required. Hemorrhoid surgery Hysterectomy  Review of Systems       As stated in the HPI and negative for all other systems.   Vital Signs:  Patient profile:   77 year old female Height:      65 inches Weight:      179 pounds BMI:     29.89 Pulse rate:   58 / minute Resp:     16 per minute BP sitting:   170 / 58  (left arm)  Vitals Entered By: Levora Angel, CNA (May 05, 2010 10:19 AM)  Physical Exam  General:  Well developed, well nourished, in no acute distress. Head:  normocephalic and atraumatic Neck:  Neck supple, no JVD. No masses, thyromegaly or abnormal cervical nodes. Chest Wall:  no deformities or breast masses noted Lungs:  Clear bilaterally to auscultation and percussion. Abdomen:  Bowel sounds positive; abdomen soft and non-tender without masses, organomegaly, or hernias noted. No hepatosplenomegaly. Msk:  Back normal, normal gait. Muscle strength and tone normal. Extremities:  No clubbing or cyanosis, mild left greater than right lower extremity edema Neurologic:  Alert and oriented x 3. Skin:  Intact without lesions or rashes. Cervical Nodes:  no significant adenopathy Psych:  Normal affect.   Detailed Cardiovascular Exam  Neck    Carotids: Carotids full and equal bilaterally without bruits.      Neck Veins:  Normal, no JVD.    Heart    Inspection: no deformities or lifts noted.      Auscultation: S1 and S2 within normal limits, no S3, 2/6 apical systolic murmur radiating slightly up the aortic outflow tract  Vascular    Abdominal Aorta: no palpable masses, pulsations, or audible bruits.      Femoral Pulses: normal femoral pulses bilaterally.      Pedal Pulses: pulses normal in all 4 extremities    Peripheral Circulation: no clubbing, cyanosis, with normal capillary refill.     Impression & Recommendations:  Problem # 1:  CARDIOMYOPATHY (ICD-425.4) She seems to be euvolemic. No change in therapy is indicated.  Problem  # 2:  RENAL INSUFFICIENCY (ICD-588.9) At the last appointment her creatinine was back to baseline. This can be reassessed at the next appointment  Problem # 3:  ATRIAL FIBRILLATION (ICD-427.31) She tolerates Coumadin which was checked again today. No change in therapy was indicated.  Problem # 4:  HYPERTENSION (ICD-401.9) Her blood pressure is elevated today. However, at home it is always well controlled and she checks it routinely. I will not change her regimen but will ask her to bring her blood pressure cuff to correlate with ours.  Patient Instructions: 1)  Your physician recommends that you schedule a follow-up appointment in: 2 months with Dr Percival Spanish 2)  Your physician recommends that you continue on your current medications as directed. Please refer to the Current Medication list given to you today.

## 2010-10-14 NOTE — Medication Information (Signed)
Summary: rov  js  Anticoagulant Therapy  Managed by: Tula Nakayama, RN, BSN Referring MD: Minus Breeding MD PCP: Loraine Maple, MD Supervising MD: Lovena Le MD, Carleene Overlie Indication 1: Atrial Fibrillation (ICD-427.31) Lab Used: [facility] Site: [facility] INR POC 3.2 INR RANGE 2 - 3  Dietary changes: no    Health status changes: no    Bleeding/hemorrhagic complications: no    Recent/future hospitalizations: no    Any changes in medication regimen? no    Recent/future dental: no  Any missed doses?: no       Is patient compliant with meds? yes       Allergies: No Known Drug Allergies  Anticoagulation Management History:      The patient is taking warfarin and comes in today for a routine follow up visit.  Positive risk factors for bleeding include an age of 76 years or older and presence of serious comorbidities.  The bleeding index is 'intermediate risk'.  Positive CHADS2 values include History of CHF, History of HTN, and Age > 49 years old.  The start date was 08/04/2006.  Anticoagulation responsible provider: Lovena Le MD, Carleene Overlie.  INR POC: 3.2.  Cuvette Lot#: [id number].  Exp: 03/2010.    Anticoagulation Management Assessment/Plan:      The patient's current anticoagulation dose is Coumadin 3 mg tabs: Take as directed by [facility].  The target INR is 2 - 3.  The next INR is due 05/03/2009.  Anticoagulation instructions were given to patient.  Results were reviewed/authorized by Tula Nakayama, RN, BSN.  She was notified by Tula Nakayama, RN, BSN.         Prior Anticoagulation Instructions: INR = 2.6   GOAL: 2.0 - 3.0  The patient is to continue with the same dose of coumadin.  This dosage includes:   3mg  every day except 1.5mg  on Sundays, Tuesdays & Saturdays.  Current Anticoagulation Instructions: INR 3.2 Skip today then resume regular dose Pt. will be checked on 05/03/09 when she has an appt. with Dr. Percival Spanish

## 2010-10-14 NOTE — Medication Information (Signed)
Summary: rov/ewj  Anticoagulant Therapy  Managed by: Freddrick March, RN, BSN Referring MD: Minus Breeding MD PCP: Loraine Maple, MD Supervising MD: Olevia Perches MD, Jamayah Myszka Indication 1: Atrial Fibrillation (ICD-427.31) Lab Used: [facility] Site: [facility] INR POC 2.7 INR RANGE 2 - 3  Dietary changes: no    Health status changes: no    Bleeding/hemorrhagic complications: no    Recent/future hospitalizations: no    Any changes in medication regimen? no    Recent/future dental: no  Any missed doses?: no       Is patient compliant with meds? yes       Allergies (verified): No Known Drug Allergies  Anticoagulation Management History:      The patient is taking warfarin and comes in today for a routine follow up visit.  Positive risk factors for bleeding include an age of 75 years or older and presence of serious comorbidities.  The bleeding index is 'intermediate risk'.  Positive CHADS2 values include History of CHF, History of HTN, and Age > 71 years old.  The start date was 08/04/2006.  Anticoagulation responsible provider: Olevia Perches MD, Darnell Level.  INR POC: 2.7.  Cuvette Lot#: [id number].  Exp: 10/2010.    Anticoagulation Management Assessment/Plan:      The patient's current anticoagulation dose is Coumadin 3 mg tabs: Take as directed by [facility].  The target INR is 2 - 3.  The next INR is due 10/16/2009.  Anticoagulation instructions were given to patient.  Results were reviewed/authorized by Freddrick March, RN, BSN.  She was notified by Freddrick March RN.         Prior Anticoagulation Instructions: INR 3.4  Skip today's dose of coumadin, then resume same dosage 1 tablet daily except 1/2 tablet on Sundays, Tuesdays, and Thursdays.  Recheck in 3 weeks.    Current Anticoagulation Instructions: INR 2.7  Continue on same dosage 1 tablet daily except 1/2 tablet on Sundays, Tuesdays, and Thursdays.   Recheck in 4 weeks.

## 2010-10-14 NOTE — Letter (Signed)
Summary: [facility]   [facility]   Imported By: Sallee Provencal 04/23/2009 13:10:04  _____________________________________________________________________  External Attachment:    Type:   Image     Comment:   External Document

## 2010-10-14 NOTE — Progress Notes (Signed)
Summary: update on condition  Phone Note Call from Patient   Caller: Patient Reason for Call: Talk to Nurse Summary of Call: pt told to call today with update on her condition-pls call [phone] Initial call taken by: Lorenda Hatchet,  April 21, 2010 10:20 AM  Follow-up for Phone Call        at coumadin visit on last Wed she was told to take 1 dose of Lasix but has cont to take it everyday, she reports her BP has been fine and edema is gone, she will stop lasix and cont. to monitor will send mess to [facility] and Dr Percival Spanish for review  Follow-up by: Kevan Rosebush, RN,  April 21, 2010 10:41 AM

## 2010-10-14 NOTE — Medication Information (Signed)
Summary: rov/seeing Hochrein at 11am/ewj  Anticoagulant Therapy  Managed by: Tula Nakayama, RN, BSN Referring MD: Minus Breeding MD PCP: Dr. Chriss Czar Supervising MD: Lia Foyer MD, Marcello Moores Indication 1: Atrial Fibrillation (ICD-427.31) Lab Used: [facility] Site: [facility] INR POC 4.1 INR RANGE 2 - 3  Dietary changes: yes       Details: Poor appetite  Health status changes: yes       Details: Had some diarrhea yesterday and today.  Bleeding/hemorrhagic complications: no    Recent/future hospitalizations: no    Any changes in medication regimen? no    Recent/future dental: no  Any missed doses?: no       Is patient compliant with meds? yes      Comments: Seeing Dr Percival Spanish today.   Allergies: No Known Drug Allergies  Anticoagulation Management History:      The patient is taking warfarin and comes in today for a routine follow up visit.  Positive risk factors for bleeding include an age of 75 years or older and presence of serious comorbidities.  The bleeding index is 'intermediate risk'.  Positive CHADS2 values include History of CHF, History of HTN, and Age > 49 years old.  The start date was 08/04/2006.  Anticoagulation responsible provider: Lia Foyer MD, Marcello Moores.  INR POC: 4.1.  Cuvette Lot#: [id number].  Exp: 02/2011.    Anticoagulation Management Assessment/Plan:      The patient's current anticoagulation dose is Coumadin 3 mg tabs: Take as directed by [facility].  The target INR is 2 - 3.  The next INR is due 02/03/2010.  Anticoagulation instructions were given to patient.  Results were reviewed/authorized by Tula Nakayama, RN, BSN.  She was notified by Tula Nakayama, RN, BSN.         Prior Anticoagulation Instructions: INR 2.7  Continue on same dosage 1/2 tablet daily except 1 tablet on Tuesdays, Thursdays, and Saturdays.  Recheck in 3 weeks.    Current Anticoagulation Instructions: INR 4.1 Skip today's dose and on Tuesday take 1/2 pill, then resume 1/2 pill  everyday except 1 pill on Tuesdays, Thursdays and Saturdays. Recheck in 2 weeks.

## 2010-10-14 NOTE — Medication Information (Signed)
Summary: rov/jk  Anticoagulant Therapy  Managed by: Porfirio Oar, PharmD Referring MD: Minus Breeding MD PCP: Dr. Chriss Czar Supervising MD: Burt Knack MD, Legrand Como Indication 1: Atrial Fibrillation (ICD-427.31) Lab Used: [facility] Site: [employer] INR POC 2.1 INR RANGE 2 - 3  Dietary changes: no    Health status changes: no    Bleeding/hemorrhagic complications: no    Recent/future hospitalizations: no    Any changes in medication regimen? no    Recent/future dental: no  Any missed doses?: no       Is patient compliant with meds? yes       Allergies: No Known Drug Allergies  Anticoagulation Management History:      The patient is taking warfarin and comes in today for a routine follow up visit.  Positive risk factors for bleeding include an age of 75 years or older and presence of serious comorbidities.  The bleeding index is 'intermediate risk'.  Positive CHADS2 values include History of CHF, History of HTN, and Age > 15 years old.  The start date was 08/04/2006.  Anticoagulation responsible provider: Burt Knack MD, Legrand Como.  INR POC: 2.1.  Cuvette Lot#: [id number].  Exp: 07/2011.    Anticoagulation Management Assessment/Plan:      The patient's current anticoagulation dose is Coumadin 3 mg tabs: Take as directed by [facility].  The target INR is 2 - 3.  The next INR is due 06/30/2010.  Anticoagulation instructions were given to patient/[facility].  Results were reviewed/authorized by Porfirio Oar, PharmD.  She was notified by Sanda Linger.         Prior Anticoagulation Instructions: INR 2.4  Continue taking 1 tablet (3mg ) every day exceot take 1/2 tablet (1.5mg ) on Mondays and Fridays.  Recheck in 4 weeks.   Current Anticoagulation Instructions: INR 2.1  Continue taking one tablet every day except one-half tablet on Monday and Friday.  We will see you in four weeks.

## 2010-10-14 NOTE — Medication Information (Signed)
Summary: rov/sp  Anticoagulant Therapy  Managed by: Tula Nakayama, RN, BSN Referring MD: Minus Breeding MD PCP: Dr. Chriss Czar Supervising MD: Rayann Heman MD, Jeneen Rinks Indication 1: Atrial Fibrillation (ICD-427.31) Lab Used: [facility] Site: [facility] INR POC 2.3 INR RANGE 2 - 3  Dietary changes: no    Health status changes: no    Bleeding/hemorrhagic complications: no    Recent/future hospitalizations: no    Any changes in medication regimen? no    Recent/future dental: no  Any missed doses?: no       Is patient compliant with meds? yes       Allergies: No Known Drug Allergies  Anticoagulation Management History:      The patient is taking warfarin and comes in today for a routine follow up visit.  Positive risk factors for bleeding include an age of 74 years or older and presence of serious comorbidities.  The bleeding index is 'intermediate risk'.  Positive CHADS2 values include History of CHF, History of HTN, and Age > 73 years old.  The start date was 08/04/2006.  Anticoagulation responsible provider: Aries Townley MD, Jeneen Rinks.  INR POC: 2.3.  Cuvette Lot#: [id number].  Exp: 05/2011.    Anticoagulation Management Assessment/Plan:      The patient's current anticoagulation dose is Coumadin 3 mg tabs: Take as directed by [facility].  The target INR is 2 - 3.  The next INR is due 04/16/2010.  Anticoagulation instructions were given to patient/[facility].  Results were reviewed/authorized by Tula Nakayama, RN, BSN.  She was notified by Tula Nakayama, RN, BSN.         Prior Anticoagulation Instructions: INR 1.7  Increase dose to 1 tablet every day except 1/2 tablet on Monday and Friday.   Current Anticoagulation Instructions: INR 2.3 Continue 1 pill everyday except 1/2 pill on Mondays and Fridays. Recheck in 3 weeks.

## 2010-10-14 NOTE — Medication Information (Signed)
Summary: rov/jnh  Anticoagulant Therapy  Managed by: Porfirio Oar, PharmD Referring MD: Minus Breeding MD PCP: Loraine Maple, MD Supervising MD: Burt Knack MD, Legrand Como Indication 1: Atrial Fibrillation (ICD-427.31) Lab Used: [place] Site: [employer] INR POC 2.3 INR RANGE 2 - 3  Vital Signs: Blood Pressure:  140 / 70   Dietary changes: no    Health status changes: no    Bleeding/hemorrhagic complications: no    Recent/future hospitalizations: no    Any changes in medication regimen? no    Recent/future dental: no  Any missed doses?: no       Is patient compliant with meds? yes       Allergies (verified): No Known Drug Allergies  Anticoagulation Management History:      The patient is taking warfarin and comes in today for a routine follow up visit.  Positive risk factors for bleeding include an age of 12 years or older and presence of serious comorbidities.  The bleeding index is 'intermediate risk'.  Positive CHADS2 values include History of CHF, History of HTN, and Age > 33 years old.  The start date was 08/04/2006.  Anticoagulation responsible provider: Burt Knack MD, Legrand Como.  INR POC: 2.3.  Cuvette Lot#: [id number].  Exp: 06/2010.    Anticoagulation Management Assessment/Plan:      The patient's current anticoagulation dose is Coumadin 3 mg tabs: Take as directed by [facility].  The target INR is 2 - 3.  The next INR is due 08/27/2009.  Anticoagulation instructions were given to patient.  Results were reviewed/authorized by Porfirio Oar, PharmD.  She was notified by Greer Ee, Pharmacy student.         Prior Anticoagulation Instructions: INR 2.5  Continue normal dosing schedule of 1/2 tablet on Sundays, Tuesdays, and Thursdays, and 1 tablet on all other days.  Return to clinic in 4 weeks.  Current Anticoagulation Instructions: INR: 2.3  Continue the current regimen of 1 tablet daily except 1/2 tablet on Sundays, Tuesdays and Thursdays.  Recheck in 4 weeks.

## 2010-10-14 NOTE — Medication Information (Signed)
Summary: rov/ewj  Anticoagulant Therapy  Managed by: Margaretha Sheffield, PharmD Referring MD: Minus Breeding MD PCP: Dr. Chriss Czar Supervising MD: Haroldine Laws MD, Quillian Quince Indication 1: Atrial Fibrillation (ICD-427.31) Lab Used: [facility] Site: [facility] INR POC 3.3 INR RANGE 2 - 3  Dietary changes: no    Health status changes: no    Bleeding/hemorrhagic complications: no    Recent/future hospitalizations: no    Any changes in medication regimen? no    Recent/future dental: no  Any missed doses?: no       Is patient compliant with meds? yes       Current Medications (verified): 1)  Furosemide 40 Mg Tabs (Furosemide) .Marland Kitchen.. 1 By Mouth Daily 2)  Amiodarone Hcl 200 Mg Tabs (Amiodarone Hcl) .... Take 1 Tablet By Mouth Once A Day 3)  Lisinopril 40 Mg Tabs (Lisinopril) .... Take 1 Tablet By Mouth Once A Day 4)  Lovastatin 20 Mg Tabs (Lovastatin) .... Take 1 Tablet By Mouth Once A Day 5)  Norvasc 10 Mg Tabs (Amlodipine Besylate) .... Take One Tablet By Mouth Once Daily. 6)  Spironolactone 25 Mg Tabs (Spironolactone) .... Take 1 Tablet By Mouth Once A Day 7)  Klor-Con M20 20 Meq Cr-Tabs (Potassium Chloride Crys Cr) .... Take 1 Tablet By Mouth Once A Day 8)  Aricept Odt 10 Mg Tbdp (Donepezil Hcl) .... Take One Tablet By Mouth Once Daily. 9)  Coumadin 3 Mg Tabs (Warfarin Sodium) .... Take As Directed By [facility] 10)  Vitamin D 1000 Unit Tabs (Cholecalciferol) .Marland Kitchen.. 1 By Mouth Daily 11)  Multivitamins   Tabs (Multiple Vitamin) .Marland Kitchen.. 1 By Mouth Daily  Allergies (verified): No Known Drug Allergies  Anticoagulation Management History:      The patient is taking warfarin and comes in today for a routine follow up visit.  Positive risk factors for bleeding include an age of 36 years or older and presence of serious comorbidities.  The bleeding index is 'intermediate risk'.  Positive CHADS2 values include History of CHF, History of HTN, and Age > 75 years old.  The start date  was 08/04/2006.  Anticoagulation responsible provider: Bensimhon MD, Quillian Quince.  INR POC: 3.3.  Cuvette Lot#: [id number].  Exp: 12/2010.    Anticoagulation Management Assessment/Plan:      The patient's current anticoagulation dose is Coumadin 3 mg tabs: Take as directed by [facility].  The target INR is 2 - 3.  The next INR is due 11/06/2009.  Anticoagulation instructions were given to patient.  Results were reviewed/authorized by Margaretha Sheffield, PharmD.  She was notified by Margaretha Sheffield.         Prior Anticoagulation Instructions: INR 2.7  Continue on same dosage 1 tablet daily except 1/2 tablet on Sundays, Tuesdays, and Thursdays.   Recheck in 4 weeks.    Current Anticoagulation Instructions: INR 3.3  Do NOT take coumadin today.  Then return to normal dosing schedule of 1/2 tablet on Sunday, Tuesday, and Thursday, and take 1 tablet all other days. Return to clinic in 3 weeks.

## 2010-10-14 NOTE — Medication Information (Signed)
Summary: rov/jm  Anticoagulant Therapy  Managed by: Porfirio Oar, PharmD Referring MD: Minus Breeding MD PCP: Dr. Chriss Czar Supervising MD: Mare Ferrari Indication 1: Atrial Fibrillation (ICD-427.31) Lab Used: [place] Site: [facility] INR POC 2.6 INR RANGE 2 - 3  Dietary changes: no    Health status changes: no    Bleeding/hemorrhagic complications: no    Recent/future hospitalizations: no    Any changes in medication regimen? no    Recent/future dental: no  Any missed doses?: no       Is patient compliant with meds? yes       Allergies: No Known Drug Allergies  Anticoagulation Management History:      The patient is taking warfarin and comes in today for a routine follow up visit.  Positive risk factors for bleeding include an age of 75 years or older and presence of serious comorbidities.  The bleeding index is 'intermediate risk'.  Positive CHADS2 values include History of CHF, History of HTN, and Age > 19 years old.  The start date was 08/04/2006.  Anticoagulation responsible provider: brackbill.  INR POC: 2.6.  Cuvette Lot#: [id number].  Exp: 07/2011.    Anticoagulation Management Assessment/Plan:      The patient's current anticoagulation dose is Coumadin 3 mg tabs: Take as directed by [facility].  The target INR is 2 - 3.  The next INR is due 07/29/2010.  Anticoagulation instructions were given to patient/[facility].  Results were reviewed/authorized by Porfirio Oar, PharmD.  She was notified by Griffith Citron D candidate.         Prior Anticoagulation Instructions: INR 2.1  Continue taking one tablet every day except one-half tablet on Monday and Friday.  We will see you in four weeks.    Current Anticoagulation Instructions: INR 2.6  Continue taking 1 tablet everyday except 1/2 tablet on Monday and Friday. Recheck in 4 weeks.

## 2010-10-14 NOTE — Medication Information (Signed)
Summary: rov/tm  Anticoagulant Therapy  Managed by: Porfirio Oar, PharmD Referring MD: Minus Breeding MD PCP: Dr. Chriss Czar Supervising MD: Burt Knack MD, Legrand Como Indication 1: Atrial Fibrillation (ICD-427.31) Lab Used: [facility] Site: [facility] INR POC 2.0 INR RANGE 2 - 3   Health status changes: yes       Details: having some swelling around the ankles; L greater than R.  Was taken off diuretics during last hospitalization due to increased SCr.  No complaints of SOB, orthopnea, or weight change. Spoke with Dr. Burt Knack.  Pt is going to take 1 dose of Lasix today.  Also suggested compression hose to help until her appt with Dr. Percival Spanish in 2 weeks   Bleeding/hemorrhagic complications: no    Recent/future hospitalizations: no    Any changes in medication regimen? no    Recent/future dental: no  Any missed doses?: no       Is patient compliant with meds? yes       Allergies: No Known Drug Allergies  Anticoagulation Management History:      The patient is taking warfarin and comes in today for a routine follow up visit.  Positive risk factors for bleeding include an age of 75 years or older and presence of serious comorbidities.  The bleeding index is 'intermediate risk'.  Positive CHADS2 values include History of CHF, History of HTN, and Age > 75 years old.  The start date was 08/04/2006.  Anticoagulation responsible provider: Burt Knack MD, Legrand Como.  INR POC: 2.0.  Cuvette Lot#: [id number].  Exp: 04/2011.    Anticoagulation Management Assessment/Plan:      The patient's current anticoagulation dose is Coumadin 3 mg tabs: Take as directed by [facility].  The target INR is 2 - 3.  The next INR is due 03/11/2010.  Anticoagulation instructions were given to patient/[facility].  Results were reviewed/authorized by Porfirio Oar, PharmD.  She was notified by Porfirio Oar PharmD.         Prior Anticoagulation Instructions: INR 1.9 Today 4.5mg  then resume 3mg s daily except 1.5mg  on MWF.  Recheck in one week. [place] discharging pt today.   Current Anticoagulation Instructions: INR 2.0  Continue same dose of 1 tablet every day except 1/2 tablet on Monday, Wednesday and Friday

## 2010-10-14 NOTE — Letter (Signed)
Summary: [place]   Imported By: Marilynne Drivers 02/18/2010 10:41:25  _____________________________________________________________________  External Attachment:    Type:   Image     Comment:   External Document

## 2010-10-14 NOTE — Medication Information (Signed)
Summary: rov/eac  Anticoagulant Therapy  Managed by: Gwynneth Albright, PharmD Referring MD: Minus Breeding MD PCP: Dr. Chriss Czar Supervising MD: Rayann Heman MD, Jeneen Rinks Indication 1: Atrial Fibrillation (ICD-427.31) Lab Used[facility]  Site: [facility] INR POC 3.9 INR RANGE 2 - 3  Dietary changes: no    Health status changes: no    Bleeding/hemorrhagic complications: no    Recent/future hospitalizations: no    Any changes in medication regimen? no    Recent/future dental: no  Any missed doses?: no       Is patient compliant with meds? yes       Allergies: No Known Drug Allergies  Anticoagulation Management History:      The patient is taking warfarin and comes in today for a routine follow up visit.  Positive risk factors for bleeding include an age of 63 years or older and presence of serious comorbidities.  The bleeding index is 'intermediate risk'.  Positive CHADS2 values include History of CHF, History of HTN, and Age > 74 years old.  The start date was 08/04/2006.  Anticoagulation responsible provider: Afton Lavalle MD, Jeneen Rinks.  INR POC: 3.9.  Cuvette Lot#: [id number].  Exp: 01/2011.    Anticoagulation Management Assessment/Plan:      The patient's current anticoagulation dose is Coumadin 3 mg tabs: Take as directed by [facility].  The target INR is 2 - 3.  The next INR is due 01/07/2010.  Anticoagulation instructions were given to patient.  Results were reviewed/authorized by Gwynneth Albright, PharmD.  She was notified by Gwynneth Albright, PharmD.         Prior Anticoagulation Instructions: INR 3.3  Do NOT take coumadin today.  Then return to normal dosing schedule of 1/2 tablet on Sunday, Tuesday, and Thursday, and take 1 tablet all other days.  Return to clinic in  3 weeks.   Current Anticoagulation Instructions: INR 3.9. Hold Coumadin today, then take 0.5 tablet daily except 1 tablet Tues, Thurs, Sat.  Recheck in 7-10 days.

## 2010-10-14 NOTE — Medication Information (Signed)
Summary: rov-tp  Anticoagulant Therapy  Managed by: Alinda Deem PharmD BCPS CPP PCP: Loraine Maple, MD Supervising MD: Marius Ditch MD  PT 21.1  Dietary changes: no    Health status changes: no    Bleeding/hemorrhagic complications: no    Recent/future hospitalizations: no    Any changes in medication regimen? no    Recent/future dental: yes     Details: teeth to be cleaned  Any missed doses?: no       Is patient compliant with meds? yes       Allergies (verified): No Known Drug Allergies  Anticoagulation Management History:      The patient is on coumadin and comes in today for a routine follow up visit.  Positive risk factors for bleeding include an age of 75 years or older and presence of serious comorbidities.  The bleeding index is 'intermediate risk'.  Positive CHADS2 values include History of CHF, History of HTN, and Age > 6 years old.    Anticoagulation Management Assessment/Plan:      The patient's current anticoagulation dose is Coumadin 3 mg tabs: Marland Kitchen  She is to have a 02/21/2009.  Anticoagulation instructions were given to patient.  Results were reviewed/authorized by Alinda Deem PharmD BCPS CPP.         Current Anticoagulation Instructions: Coumadin 3 mg tabs: Marland Kitchen  The patient is to continue with the same dose of coumadin.  This dosage includes: increasing greens

## 2010-10-14 NOTE — Letter (Signed)
Summary: [place]   Imported By: Marilynne Drivers 02/18/2010 10:40:54  _____________________________________________________________________  External Attachment:    Type:   Image     Comment:   External Document

## 2010-10-14 NOTE — Medication Information (Signed)
Summary: rov  js  Anticoagulant Therapy  Managed by: Alinda Deem PharmD BCPS CPP Referring MD: Minus Breeding MD PCP: Loraine Maple, MD Supervising MD: Percival Spanish MD, Jeneen Rinks Indication 1: Atrial Fibrillation (ICD-427.31) Lab Used: [facility] Site: [facility] PT 25.5 INR POC 4.4  Dietary changes: no    Health status changes: no    Bleeding/hemorrhagic complications: no    Recent/future hospitalizations: no    Any changes in medication regimen? no    Recent/future dental: no  Any missed doses?: no       Is patient compliant with meds? yes       Allergies (verified): No Known Drug Allergies  Anticoagulation Management History:      The patient is on coumadin and comes in today for a routine follow up visit.  Positive risk factors for bleeding include an age of 15 years or older and presence of serious comorbidities.  The bleeding index is 'intermediate risk'.  Positive CHADS2 values include History of CHF, History of HTN, and Age > 10 years old.  The start date was 08/04/2006.    Anticoagulation Management Assessment/Plan:      The patient's current anticoagulation dose is Coumadin 3 mg tabs: Sunday - 1 tab, Monday - 0.5 tab, Tuesday - 1 tab, Wednesday - 1 tab, Thursday - 0.5 tab, Friday - 1 tab, Saturday - 1 tab, Coumadin 3 mg tabs: Sunday - 0.5 tab, Monday - 1 tab, Tuesday - 0.5 tab, Wednesday - 1 tab, Thursday - 0.5 tab, Friday - 1 tab, Saturday - 0.5 tab.  She is to have a 03/07/2009.  Anticoagulation instructions were given to patient.  Results were reviewed/authorized by Alinda Deem PharmD BCPS CPP.         Prior Anticoagulation Instructions: Coumadin 3 mg tabs: Marland Kitchen  The patient is to continue with the same dose of coumadin.  This dosage includes: increasing greens  Current Anticoagulation Instructions: INR 4.4 1.5mg s once daily/3mg s MWF

## 2010-10-14 NOTE — Assessment & Plan Note (Signed)
Summary: 3 MONTH/DMP   Visit Type:  Follow-up Primary Provider:  Dr. Chriss Czar  CC:  Atrial Fibrillation and Cardiomyopathy.  History of Present Illness: The patient presents for followup of the above. Since I last saw her she has had no new cardiovascular problems. She enjoys her activities of daily living and ambulate with a cane. She denies any shortness of breath. She's not having any resting complaints such as PND or orthopnea. She's not noticed any palpitations and has had no presyncope or syncope. She has had no edema or weight gain.   Current Medications (verified): 1)  Furosemide 40 Mg Tabs (Furosemide) .Marland Kitchen.. 1 By Mouth Daily 2)  Amiodarone Hcl 200 Mg Tabs (Amiodarone Hcl) .... Take 1 Tablet By Mouth Once A Day 3)  Lisinopril 40 Mg Tabs (Lisinopril) .... Take 1 Tablet By Mouth Once A Day 4)  Lovastatin 20 Mg Tabs (Lovastatin) .... Take 1 Tablet By Mouth Once A Day 5)  Norvasc 10 Mg Tabs (Amlodipine Besylate) .... Take One Tablet By Mouth Once Daily. 6)  Spironolactone 25 Mg Tabs (Spironolactone) .... Take 1 Tablet By Mouth Once A Day 7)  Klor-Con M20 20 Meq Cr-Tabs (Potassium Chloride Crys Cr) .... Take 1 Tablet By Mouth Once A Day 8)  Aricept Odt 10 Mg Tbdp (Donepezil Hcl) .... Take One Tablet By Mouth Once Daily. 9)  Coumadin 3 Mg Tabs (Warfarin Sodium) .... Take As Directed By [facility] Clinic 10)  Vitamin D 1000 Unit Tabs (Cholecalciferol) .Marland Kitchen.. 1 By Mouth Daily 11)  Multivitamins   Tabs (Multiple Vitamin) .Marland Kitchen.. 1 By Mouth Daily  Allergies (verified): No Known Drug Allergies  Past History:  Past Medical History: Reviewed history from 01/29/2009 and no changes required.  1. Paroxysmal atrial fibrillation.   2. Malleolar fractures status post open reduction and internal       fixation.   3. Hypertension.   4. Mild dementia.   5. Dyslipidemia.   6. Glaucoma.   7. Anemia.   8. Hysterectomy.   9. Cardiomyopathy (presumed ischemic in the past with an EF of 35%.    The most recent ejection fraction in July 2009, however, was 55-       60%.)   10.Bradycardia.   Past Surgical History: Reviewed history from 12/28/2007 and no changes required. Hemorrhoid surgery Hysterectomy  Review of Systems       As stated in the HPI and negative for all other systems.   Vital Signs:  Patient profile:   77 year old female Height:      65 inches Weight:      179 pounds Pulse rate:   50 / minute Resp:     16 per minute BP sitting:   135 / 61  (right arm)  Vitals Entered By: Levora Angel, CNA (October 01, 2009 11:29 AM)  Physical Exam  General:  Well developed, well nourished, in no acute distress. Head:  normocephalic and atraumatic Eyes:  PERRLA/EOM intact; conjunctiva and lids normal. Mouth:  gums and palate normal. Oral mucosa normal. Neck:  Neck supple, no JVD. No masses, thyromegaly or abnormal cervical nodes. Lungs:  Clear bilaterally to auscultation and percussion. Heart:  Non-displaced PMI, chest non-tender; regular rate and rhythm, S1, S2 without murmurs, rubs or gallops. Carotid upstroke normal, no bruit. Normal abdominal aortic size, no bruits. Femorals normal pulses, no bruits. Pedals normal pulses. No edema, no varicosities. Abdomen:  Bowel sounds positive; abdomen soft and non-tender without masses, organomegaly, or hernias noted. No hepatosplenomegaly. Msk:  Back normal, normal gait. Muscle strength and tone normal. Extremities:  No clubbing or cyanosis. Neurologic:  Alert and oriented x 3. Skin:  Intact without lesions or rashes. Psych:  Normal affect.   EKG  Procedure date:  10/01/2009  Findings:      Sinus bradycardia, interventricular conduction delay, premature atrial contractions, no change from previous.  Impression & Recommendations:  Problem # 1:  ATRIAL FIBRILLATION (ICD-427.31) The patient is maintaining sinus bradycardia. She has no symptomatic palpitations or bradycardia arrhythmias. At this point she will continue  the meds as listed. Orders: EKG w/ Interpretation (93000)  Problem # 2:  CARDIOMYOPATHY (ICD-425.4) Her last EF was 65% and she seems to be euvolemic. She will continue the meds as listed.  Problem # 3:  HYPERTENSION (ICD-401.9) Her blood pressure is controlled. She will continue the meds as listed. I will be doing surveillance blood work the next time she comes back to followup both her amiodarone and her Aldactone.  Patient Instructions: 1)  Your physician recommends that you schedule a follow-up appointment in: 4 months with Dr Percival Spanish 2)  Your physician recommends that you continue on your current medications as directed. Please refer to the Current Medication list given to you today.

## 2010-10-14 NOTE — Miscellaneous (Signed)
Summary: Orders Update  Clinical Lists Changes  Medications: Rx of NORVASC 10 MG TABS (AMLODIPINE BESYLATE) Take one tablet by mouth once daily.;  #30 x 11;  Signed;  Entered by: Rhett Bannister;  Authorized by: Minus Breeding, MD, [facility];  Method used: Electronically to [id number] [address], [place], [place]  [address], Ph: [phone], Fax: [phone] Orders: Added new Test order of TLB-TSH (Thyroid Stimulating Hormone) (84443-TSH) - Signed Added new Test order of TLB-BMP (Basic Metabolic Panel-BMET) (99991111) - Signed Added new Test order of TLB-Hepatic/Liver Function Pnl (80076-HEPATIC) - Signed    Prescriptions: NORVASC 10 MG TABS (AMLODIPINE BESYLATE) Take one tablet by mouth once daily.  #30 x 11   Entered by:   Rhett Bannister   Authorized by:   Minus Breeding, MD, [facility]   Signed by:   Rhett Bannister on 05/03/2009   Method used:   Electronically to        [id number] (retail)       [address]       [place], [place]  [address]       Ph: [phone]       Fax: [phone]   RxID:   [id number]

## 2010-10-14 NOTE — Medication Information (Signed)
Summary: rov/tm  Anticoagulant Therapy  Managed by: Alinda Deem, PharmD, BCPS, CPP Referring MD: Minus Breeding MD PCP: Loraine Maple, MD Supervising MD: Stanford Breed MD, Aaron Edelman Indication 1: Atrial Fibrillation (ICD-427.31) Lab Used: [facility] Site: [facility] INR POC 3.0 INR RANGE 2 - 3  Dietary changes: no    Health status changes: no    Bleeding/hemorrhagic complications: no    Recent/future hospitalizations: no    Any changes in medication regimen? no    Recent/future dental: no  Any missed doses?: no       Is patient compliant with meds? yes       Allergies (verified): No Known Drug Allergies  Anticoagulation Management History:      The patient is taking warfarin and comes in today for a routine follow up visit.  Positive risk factors for bleeding include an age of 75 years or older and presence of serious comorbidities.  The bleeding index is 'intermediate risk'.  Positive CHADS2 values include History of CHF, History of HTN, and Age > 24 years old.  The start date was 08/04/2006.  Anticoagulation responsible provider: Stanford Breed MD, Aaron Edelman.  INR POC: 3.0.  Cuvette Lot#: [id number].  Exp: 06/2010.    Anticoagulation Management Assessment/Plan:      The patient's current anticoagulation dose is Coumadin 3 mg tabs: Take as directed by [facility].  The target INR is 2 - 3.  The next INR is due 05/22/2009.  Anticoagulation instructions were given to patient.  Results were reviewed/authorized by Alinda Deem, PharmD, BCPS, CPP.         Prior Anticoagulation Instructions: INR 3.2 Skip today then resume regular dose Pt. will be checked on 05/03/09 when she has an appt. with Dr. Percival Spanish  Current Anticoagulation Instructions: INR 3.0 Take 0.5 tablet today, then continue same dosing:  1 tablet daily except 0.5 tablet on Sundays, Tuesdays and Saturdays.

## 2010-10-14 NOTE — Progress Notes (Signed)
Summary: abn labs   pt aware  Phone Note Other Incoming   Caller: Ignacia Bayley Reason for Call: Discuss lab or test results Details for Reason: pt's K is 6.3 per lab work today.  Results are not in the system yet. 5:38pm Summary of Call: Per Dr Percival Spanish pt to stop Klor-Con, Spironolactone take Kayexalate 30 grams and repeat BMP in the am. Initial call taken by: Sim Boast, RN,  Jan 20, 2010 5:41 PM  Follow-up for Phone Call        After further review of all labs, Dr Percival Spanish orders are for the pt to report to the ER for treatment.  Daughter and pt aware. Follow-up by: Sim Boast, RN,  Jan 20, 2010 6:12 PM

## 2010-10-14 NOTE — Medication Information (Signed)
Summary: rov/jk  Anticoagulant Therapy  Managed by: Porfirio Oar, PharmD Referring MD: Minus Breeding MD PCP: Dr. Chriss Czar Supervising MD: Percival Spanish MD, Jeneen Rinks Indication 1: Atrial Fibrillation (ICD-427.31) Lab Used: [facility] Site: [facility] INR POC 2.4 INR RANGE 2 - 3  Dietary changes: yes       Details: Eaten a few less greens the past 2 weeks.   Health status changes: no    Bleeding/hemorrhagic complications: no    Recent/future hospitalizations: no    Any changes in medication regimen? no    Recent/future dental: no  Any missed doses?: no       Is patient compliant with meds? yes       Allergies: No Known Drug Allergies  Anticoagulation Management History:      The patient is taking warfarin and comes in today for a routine follow up visit.  Positive risk factors for bleeding include an age of 17 years or older and presence of serious comorbidities.  The bleeding index is 'intermediate risk'.  Positive CHADS2 values include History of CHF, History of HTN, and Age > 67 years old.  The start date was 08/04/2006.  Anticoagulation responsible provider: Percival Spanish MD, Jeneen Rinks.  INR POC: 2.4.  Cuvette Lot#: [id number].  Exp: 06/2011.    Anticoagulation Management Assessment/Plan:      The patient's current anticoagulation dose is Coumadin 3 mg tabs: Take as directed by [facility]madin Clinic.  The target INR is 2 - 3.  The next INR is due 06/02/2010.  Anticoagulation instructions were given to patient/[facility].  Results were reviewed/authorized by Porfirio Oar, PharmD.  She was notified by Vassie Loll, PharmD Candidate.         Prior Anticoagulation Instructions: INR- 1.9  Take 1.5 tablets (4.5mg ) today.  Then continue taking 1 tablet (3 mg) on Sun, Tues, Wed, Thurs, and Sat.  Take 1/2 tablet on Mon and Fri.  Recheck INR in 3 weeks.   Current Anticoagulation Instructions: INR 2.4  Continue taking 1 tablet (3mg ) every day exceot take 1/2 tablet (1.5mg ) on Mondays and Fridays.   Recheck in 4 weeks.

## 2010-10-16 ENCOUNTER — Encounter: Payer: Self-pay | Admitting: Cardiology

## 2010-10-16 ENCOUNTER — Ambulatory Visit: Admit: 2010-10-16 | Payer: Self-pay

## 2010-10-16 ENCOUNTER — Encounter (INDEPENDENT_AMBULATORY_CARE_PROVIDER_SITE_OTHER): Payer: No Typology Code available for payment source

## 2010-10-16 DIAGNOSIS — I4891 Unspecified atrial fibrillation: Secondary | ICD-10-CM

## 2010-10-16 DIAGNOSIS — Z7901 Long term (current) use of anticoagulants: Secondary | ICD-10-CM

## 2010-10-16 LAB — CONVERTED CEMR LAB: POC INR: 1.7

## 2010-10-16 NOTE — Medication Information (Signed)
Summary: rov/tm  Anticoagulant Therapy  Managed by: Porfirio Oar, PharmD Referring MD: Minus Breeding MD PCP: Dr. Chriss Czar Supervising MD: Aundra Dubin MD, Kirk Ruths Indication 1: Atrial Fibrillation (ICD-427.31) Lab Used: [facility] Site: [facility] INR POC 1.6 INR RANGE 2 - 3  Dietary changes: no    Health status changes: no    Bleeding/hemorrhagic complications: no    Recent/future hospitalizations: no    Any changes in medication regimen? no    Recent/future dental: no  Any missed doses?: no       Is patient compliant with meds? yes       Allergies: No Known Drug Allergies  Anticoagulation Management History:      The patient is taking warfarin and comes in today for a routine follow up visit.  Positive risk factors for bleeding include an age of 75 years or older and presence of serious comorbidities.  The bleeding index is 'intermediate risk'.  Positive CHADS2 values include History of CHF, History of HTN, and Age > 59 years old.  The start date was 08/04/2006.  Anticoagulation responsible provider: Aundra Dubin MD, [place].  INR POC: 1.6.  Cuvette Lot#: [id number].  Exp: 10/2011.    Anticoagulation Management Assessment/Plan:      The patient's current anticoagulation dose is Coumadin 3 mg tabs: Take as directed by [facility].  The target INR is 2 - 3.  The next INR is due 09/17/2010.  Anticoagulation instructions were given to patient.  Results were reviewed/authorized by Porfirio Oar, PharmD.  She was notified by Porfirio Oar PharmD.         Prior Anticoagulation Instructions: INR 1.5 Today take 1 1/2 pills then change dose to 1 pill everyday. Recheck in 2 weeks.   Current Anticoagulation Instructions: INR 1.6  Increase dose to 1 tablet every day except 1 1/2 tablets on Wednesday and Saturday.  Recheck INR in 2 weeks.

## 2010-10-16 NOTE — Medication Information (Signed)
Summary: rov/sp  Anticoagulant Therapy  Managed by: Bonnita Nasuti, PharmD, BCPS, CPP Referring MD: Minus Breeding MD PCP: Dr. Chriss Czar Supervising MD: Percival Spanish MD, Jeneen Rinks Indication 1: Atrial Fibrillation (ICD-427.31) Lab Used: [facility] Site: [facility] INR POC 2.1 INR RANGE 2 - 3  Dietary changes: no    Health status changes: no    Bleeding/hemorrhagic complications: no    Recent/future hospitalizations: no    Any changes in medication regimen? no    Recent/future dental: no  Any missed doses?: no       Is patient compliant with meds? yes       Current Medications (verified): 1)  Lovastatin 20 Mg Tabs (Lovastatin) .... Take 1 Tablet By Mouth Once A Day 2)  Norvasc 10 Mg Tabs (Amlodipine Besylate) .... Take One Tablet By Mouth Once Daily. 3)  Aricept Odt 10 Mg Tbdp (Donepezil Hcl) .... Take One Tablet By Mouth Once Daily. 4)  Coumadin 3 Mg Tabs (Warfarin Sodium) .... Take As Directed By [facility] Clinic 5)  Vitamin D 1000 Unit Tabs (Cholecalciferol) .Marland Kitchen.. 1 By Mouth Daily 6)  Multivitamins   Tabs (Multiple Vitamin) .Marland Kitchen.. 1 By Mouth Daily 7)  Hydralazine Hcl 10 Mg Tabs (Hydralazine Hcl) .Marland Kitchen.. 1 Tab 3 Times Per Day 8)  Furosemide 40 Mg Tabs (Furosemide) .Marland Kitchen.. 1 By Mouth As Needed  Allergies (verified): No Known Drug Allergies  Anticoagulation Management History:      The patient is taking warfarin and comes in today for a routine follow up visit.  Positive risk factors for bleeding include an age of 75 years or older and presence of serious comorbidities.  The bleeding index is 'intermediate risk'.  Positive CHADS2 values include History of CHF, History of HTN, and Age > 17 years old.  The start date was 08/04/2006.  Anticoagulation responsible provider: Percival Spanish MD, Jeneen Rinks.  INR POC: 2.1.  Cuvette Lot#: [id number].  Exp: 10/2011.    Anticoagulation Management Assessment/Plan:      The patient's current anticoagulation dose is Coumadin 3 mg tabs: Take as directed by  [facility].  The target INR is 2 - 3.  The next INR is due 10/15/2010.  Anticoagulation instructions were given to patient.  Results were reviewed/authorized by Bonnita Nasuti, PharmD, BCPS, CPP.         Prior Anticoagulation Instructions: INR 1.6  Increase dose to 1 tablet every day except 1 1/2 tablets on Wednesday and Saturday.  Recheck INR in 2 weeks.   Current Anticoagulation Instructions: INR 2.1  Coumadin 3mg  tab - take 1 tab each day EXCEPT 1.5 tab on WED and SAT

## 2010-10-17 ENCOUNTER — Telehealth: Payer: Self-pay | Admitting: Cardiology

## 2010-10-22 NOTE — Progress Notes (Signed)
Summary: Blood Pressures  rx for HYDRALAZINE 10 MG (2)TID  Phone Note Other Incoming   Caller: pt Details for Reason: BPs Summary of Call: Pt left a note for Dr Percival Spanish of recent BPs.  154/84, 145/68, 168/78, 165/79 and 158/73.  Will forward for his review. Initial call taken by: Sim Boast, RN,  October 17, 2010 9:59 AM  Follow-up for Phone Call        Increase hydralizine to 20 mg three times a day. Follow-up by: Minus Breeding, MD, [facility],  October 17, 2010 1:20 PM  Additional Follow-up for Phone Call Additional follow up Details #1::        pt aware to increase to 20 mg three times a day,  she will keep a check on her BP and left [place] know if it doesn't improve Additional Follow-up by: Sim Boast, RN,  October 17, 2010 4:58 PM    New/Updated Medications: HYDRALAZINE HCL 10 MG TABS (HYDRALAZINE HCL) 2 tablets three times a day Prescriptions: HYDRALAZINE HCL 10 MG TABS (HYDRALAZINE HCL) 2 tablets three times a day  #180 x 6   Entered by:   Sim Boast, RN   Authorized by:   Minus Breeding, MD, [facility]   Signed by:   Sim Boast, RN on 10/17/2010   Method used:   Electronically to        [facility] (retail)       [address]       [place], [place]  [address]       Ph: [phone]       Fax: [phone]   RxID:   [id number]

## 2010-10-22 NOTE — Medication Information (Signed)
Summary: rov/ejm  Anticoagulant Therapy  Managed by: Alycia Rossetti, PharmD Referring MD: Minus Breeding MD PCP: Dr. Chriss Czar Supervising MD: Aundra Dubin MD, Kirk Ruths Indication 1: Atrial Fibrillation (ICD-427.31) Lab Used: [facility] Site: [facility] INR POC 1.7 INR RANGE 2 - 3  Dietary changes: no    Health status changes: no    Bleeding/hemorrhagic complications: no    Recent/future hospitalizations: no    Any changes in medication regimen? no    Recent/future dental: no  Any missed doses?: no       Is patient compliant with meds? yes       Allergies: No Known Drug Allergies  Anticoagulation Management History:      Positive risk factors for bleeding include an age of 64 years or older and presence of serious comorbidities.  The bleeding index is 'intermediate risk'.  Positive CHADS2 values include History of CHF, History of HTN, and Age > 7 years old.  The start date was 08/04/2006.  Anticoagulation responsible provider: Aundra Dubin MD, Kiely Cousar.  INR POC: 1.7.  Exp: 10/2011.    Anticoagulation Management Assessment/Plan:      The patient's current anticoagulation dose is Coumadin 3 mg tabs: Take as directed by [facility].  The target INR is 2 - 3.  The next INR is due 10/30/2010.  Anticoagulation instructions were given to patient.  Results were reviewed/authorized by Alycia Rossetti, PharmD.         Prior Anticoagulation Instructions: INR 2.1  Coumadin 3mg  tab - take 1 tab each day EXCEPT 1.5 tab on WED and SAT  Current Anticoagulation Instructions: Take 2 tablets today only. Then take 1 tablet (5 mg) daily except for 1 1/2 tablets (7.5 mg) on Mondays, Wednesdays, and Fridays.  INR 1.7

## 2010-10-23 DIAGNOSIS — I4891 Unspecified atrial fibrillation: Secondary | ICD-10-CM

## 2010-10-30 ENCOUNTER — Encounter: Payer: Self-pay | Admitting: Internal Medicine

## 2010-10-30 ENCOUNTER — Encounter (INDEPENDENT_AMBULATORY_CARE_PROVIDER_SITE_OTHER): Payer: No Typology Code available for payment source

## 2010-10-30 DIAGNOSIS — I4891 Unspecified atrial fibrillation: Secondary | ICD-10-CM

## 2010-10-30 DIAGNOSIS — Z7901 Long term (current) use of anticoagulants: Secondary | ICD-10-CM

## 2010-10-30 LAB — CONVERTED CEMR LAB: POC INR: 1.6

## 2010-11-05 ENCOUNTER — Telehealth: Payer: Self-pay | Admitting: Cardiology

## 2010-11-05 NOTE — Medication Information (Signed)
Summary: rov/sp  Anticoagulant Therapy  Managed by: Danella Penton, RN Referring MD: Minus Breeding MD PCP: Dr. Chriss Czar Supervising MD: Harrington Challenger MD, Nevin Bloodgood Indication 1: Atrial Fibrillation (ICD-427.31) Lab Used: [facility] Site: [facility] INR POC 1.6 INR RANGE 2 - 3  Dietary changes: no    Health status changes: no    Bleeding/hemorrhagic complications: no    Recent/future hospitalizations: no    Any changes in medication regimen? no    Recent/future dental: no  Any missed doses?: no       Is patient compliant with meds? yes       Allergies: No Known Drug Allergies  Anticoagulation Management History:      The patient is taking warfarin and comes in today for a routine follow up visit.  Positive risk factors for bleeding include an age of 75 years or older and presence of serious comorbidities.  The bleeding index is 'intermediate risk'.  Positive CHADS2 values include History of CHF, History of HTN, and Age > 64 years old.  The start date was 08/04/2006.  Anticoagulation responsible provider: Harrington Challenger MD, Nevin Bloodgood.  INR POC: 1.6.  Exp: 10/2011.    Anticoagulation Management Assessment/Plan:      The patient's current anticoagulation dose is Coumadin 3 mg tabs: Take as directed by [facility].  The target INR is 2 - 3.  The next INR is due 11/13/2010.  Anticoagulation instructions were given to patient.  Results were reviewed/authorized by Danella Penton, RN.  She was notified by Danella Penton, RN.         Prior Anticoagulation Instructions: Take 2 tablets today only. Then take 1 tablet (5 mg) daily except for 1 1/2 tablets (7.5 mg) on Mondays, Wednesdays, and Fridays.  INR 1.7   Current Anticoagulation Instructions: INR 1.6 Take 2 tablets today, then begin taking 1 1/2 tablets every day, except take 1 tablet on Tuesdays, Thursdays, and Saturdays. Recheck in 2 weeks.

## 2010-11-06 ENCOUNTER — Ambulatory Visit (INDEPENDENT_AMBULATORY_CARE_PROVIDER_SITE_OTHER): Payer: No Typology Code available for payment source | Admitting: Cardiology

## 2010-11-06 ENCOUNTER — Encounter: Payer: Self-pay | Admitting: Cardiology

## 2010-11-06 DIAGNOSIS — I10 Essential (primary) hypertension: Secondary | ICD-10-CM

## 2010-11-11 NOTE — Assessment & Plan Note (Signed)
Summary: 401.1 2 month rov. pfh/sp   Visit Type:  Follow-up Primary Provider:  Dr. Chriss Czar  CC:  HTN.  History of Present Illness: The patient presents for followup of difficult to control hypertension. Following the last appointment she gets a blood pressure diary and she called with results demonstrating systolics in the [id number] and one diabetes fairly consistently. I had her double her hydralazine. However, her blood pressures are still elevated. She denies any symptoms. In particular she is not having any shortness of breath, PND or orthopnea. She is not having any chest pressure, neck or arm discomfort. She is not having any palpitations, presyncope or syncope. She has had no weight gain or edema.   Current Medications (verified): 1)  Lovastatin 20 Mg Tabs (Lovastatin) .... Take 1 Tablet By Mouth Once A Day 2)  Norvasc 10 Mg Tabs (Amlodipine Besylate) .... Take One Tablet By Mouth Once Daily. 3)  Aricept Odt 10 Mg Tbdp (Donepezil Hcl) .... Take One Tablet By Mouth Once Daily. 4)  Coumadin 3 Mg Tabs (Warfarin Sodium) .... Take As Directed By [facility] Clinic 5)  Vitamin D 1000 Unit Tabs (Cholecalciferol) .Marland Kitchen.. 1 By Mouth Daily 6)  Multivitamins   Tabs (Multiple Vitamin) .Marland Kitchen.. 1 By Mouth Daily 7)  Hydralazine Hcl 10 Mg Tabs (Hydralazine Hcl) .... 2 Tablets Three Times A Day 8)  Furosemide 40 Mg Tabs (Furosemide) .Marland Kitchen.. 1 By Mouth As Needed  Allergies (verified): No Known Drug Allergies  Past History:  Past Medical History: Reviewed history from 03/12/2010 and no changes required.  1. Paroxysmal atrial fibrillation.   2. Malleolar fractures status post open reduction and internal       fixation.   3. Hypertension.   4. Mild dementia.   5. Dyslipidemia.   6. Glaucoma.   7. Anemia.   8. Hysterectomy.   9. Cardiomyopathy (presumed ischemic in the past with an EF of 35%.       The most recent ejection fraction in July 2009, however, was 55-       60%.)   10.Bradycardia.   11.  Chronic renal insufficiency  Past Surgical History: Reviewed history from 12/28/2007 and no changes required. Hemorrhoid surgery Hysterectomy  Review of Systems       As stated in the HPI and negative for all other systems.   Vital Signs:  Patient profile:   77 year old female Height:      65 inches Weight:      177 pounds BMI:     29.56 Pulse rate:   68 / minute Resp:     16 per minute BP sitting:   177 / 66  (right arm)  Vitals Entered By: Levora Angel, CNA (November 06, 2010 10:23 AM)  Physical Exam  General:  Well developed, well nourished, in no acute distress. Head:  normocephalic and atraumatic Eyes:  PERRLA/EOM intact; conjunctiva and lids normal. Mouth:  Teeth, gums and palate normal. Oral mucosa normal. Neck:  Neck supple, no JVD. No masses, thyromegaly or abnormal cervical nodes. Chest Wall:  no deformities or breast masses noted Lungs:  Clear bilaterally to auscultation and percussion. Abdomen:  Bowel sounds positive; abdomen soft and non-tender without masses, organomegaly, or hernias noted. No hepatosplenomegaly. Msk:  Back normal, normal gait. Muscle strength and tone normal. Extremities:  No clubbing or cyanosis, mild left greater than right lower extremity edema Neurologic:  Alert and oriented x 3. Skin:  Intact without lesions or rashes. Cervical Nodes:  no significant adenopathy Inguinal  Nodes:  no significant adenopathy Psych:  Normal affect.   Detailed Cardiovascular Exam  Neck    Carotids: Carotids full and equal bilaterally without bruits.      Neck Veins: Normal, no JVD.    Heart    Inspection: no deformities or lifts noted.      Auscultation: S1 and S2 within normal limits, no S3, 2/6 apical systolic murmur radiating slightly up the aortic outflow tract  Vascular    Abdominal Aorta: no palpable masses, pulsations, or audible bruits.      Femoral Pulses: normal femoral pulses bilaterally.      Pedal Pulses: pulses normal in all 4  extremities    Peripheral Circulation: no clubbing, cyanosis, with normal capillary refill.     EKG  Procedure date:  11/06/2010  Findings:      Sinus rhythm, rate 73, left axis deviation, interventricular conduction delay, left ventricular hypertrophy, premature ectopic complexes  Impression & Recommendations:  Problem # 1:  HYPERTENSION (ICD-401.9) Her blood pressure is still not controlled I am avoiding Aldactone and ACE/ARB because of previous hyperkalemia. Her creatinine is 1.1. Her potassium has been normal. She needs better blood pressure control and I think the best alternative is Tekturna.  She will start on 150 daily and we will check a BMET in two weeks.  Problem # 2:  BRADYCARDIA (ICD-427.89) I am avoiding drugs that might slow her heart rate further including clonidine. She has no symptomatic bradycardia arrhythmias. Orders: EKG w/ Interpretation (93000)  Problem # 3:  CARDIOMYOPATHY (ICD-425.4) She seems to be euvolemic and she will continue on meds as listed.  Problem # 4:  ATRIAL FIBRILLATION (ICD-427.31) She seems to be maintaining sinus rhythm. She tolerates Coumadin. Orders: EKG w/ Interpretation (93000)  Patient Instructions: 1)  Your physician recommends that you schedule a follow-up appointment in: 2 months with Dr Percival Spanish 2)  Your physician has recommended you make the following change in your medication: start Tekturna 150 mg once daily Prescriptions: TEKTURNA 150 MG TABS (ALISKIREN FUMARATE) once daily  #30 x 11   Entered by:   Sim Boast, RN   Authorized by:   Minus Breeding, MD, [facility]   Signed by:   Sim Boast, RN on 11/06/2010   Method used:   Electronically to        [facility] (retail)       [address]       [place], [place]  [address]       Ph: [phone]       Fax: [phone]   [id number]  I have reviewed and approved all prescriptions at the time of this visit. Minus Breeding, MD, [facility]   November 06, 2010 11:06 AM

## 2010-11-11 NOTE — Progress Notes (Signed)
Summary: medical expense verification  Phone Note Other Incoming   Caller: heather/ [phone] Summary of Call: Nira Conn states she needs medical expense verfication form fill out by the dr. for the pt rent.  Form was fax and dr. Dierdre Highman. to be filled out and fax back to heather.  Initial call taken by: Regan Lemming,  November 05, 2010 10:08 AM  Follow-up for Phone Call        Form was given to Stacie Glaze to be filled out and sent in.  Will forward to her for follow up. Follow-up by: Sim Boast, RN,  November 05, 2010 11:20 AM

## 2010-11-12 ENCOUNTER — Telehealth: Payer: Self-pay | Admitting: Cardiology

## 2010-11-13 ENCOUNTER — Encounter (INDEPENDENT_AMBULATORY_CARE_PROVIDER_SITE_OTHER): Payer: No Typology Code available for payment source

## 2010-11-13 ENCOUNTER — Encounter: Payer: Self-pay | Admitting: Internal Medicine

## 2010-11-13 DIAGNOSIS — I4891 Unspecified atrial fibrillation: Secondary | ICD-10-CM

## 2010-11-13 DIAGNOSIS — Z7901 Long term (current) use of anticoagulants: Secondary | ICD-10-CM

## 2010-11-13 LAB — CONVERTED CEMR LAB: POC INR: 1.7

## 2010-11-20 NOTE — Progress Notes (Signed)
Summary: Checking on medical expense form/2nd call  Phone Note Other Incoming   Caller: Heather [phone] Summary of Call: Calling regarding Medical Expense Verfication form Initial call taken by: Delsa Sale,  November 12, 2010 1:04 PM  Follow-up for Phone Call        Left message to call back. Thompson Grayer, RN, BSN  November 12, 2010 3:15 PM   Additional Follow-up for Phone Call Additional follow up Details #1::        heather is calling back  Additional Follow-up by: Regan Lemming,  November 13, 2010 8:58 AM    Additional Follow-up for Phone Call Additional follow up Details #2::    form faxed - called back but received a message stating your party is not answering please call back.  will try again  Denton Meek recieved form Follow-up by: Sim Boast, RN,  November 13, 2010 11:02 AM

## 2010-11-20 NOTE — Medication Information (Signed)
Summary: rov/pc  Anticoagulant Therapy  Managed by: Tula Nakayama, RN, BSN Referring MD: Minus Breeding MD PCP: Dr. Chriss Czar Supervising MD: Harrington Challenger MD, Nevin Bloodgood Indication 1: Atrial Fibrillation (ICD-427.31) Lab Used: [facility] Site: [facility] INR POC 1.7 INR RANGE 2 - 3  Dietary changes: no    Health status changes: no    Bleeding/hemorrhagic complications: no    Recent/future hospitalizations: no    Any changes in medication regimen? yes       Details: Tekturna 150mg s daily  Recent/future dental: no  Any missed doses?: no       Is patient compliant with meds? yes       Allergies: No Known Drug Allergies  Anticoagulation Management History:      The patient is taking warfarin and comes in today for a routine follow up visit.  Positive risk factors for bleeding include an age of 75 years or older and presence of serious comorbidities.  The bleeding index is 'intermediate risk'.  Positive CHADS2 values include History of CHF, History of HTN, and Age > 41 years old.  The start date was 08/04/2006.  Anticoagulation responsible provider: Harrington Challenger MD, Nevin Bloodgood.  INR POC: 1.7.  Cuvette Lot#: [id number].  Exp: 09/2011.    Anticoagulation Management Assessment/Plan:      The patient's current anticoagulation dose is Coumadin 3 mg tabs: Take as directed by [facility]madin Clinic.  The target INR is 2 - 3.  The next INR is due 11/27/2010.  Anticoagulation instructions were given to patient.  Results were reviewed/authorized by Tula Nakayama, RN, BSN.  She was notified by Tula Nakayama, RN, BSN.         Prior Anticoagulation Instructions: INR 1.6 Take 2 tablets today, then begin taking 1 1/2 tablets every day, except take 1 tablet on Tuesdays, Thursdays, and Saturdays. Recheck in 2 weeks.  Current Anticoagulation Instructions: INR 1.7 Today take 1.5 pills then change dose to 1.5 pills everyday except 1 pill on Tuesdays and Saturdays. Recheck in 2 weeks.

## 2010-11-25 ENCOUNTER — Encounter: Payer: Self-pay | Admitting: Cardiology

## 2010-11-27 ENCOUNTER — Encounter (INDEPENDENT_AMBULATORY_CARE_PROVIDER_SITE_OTHER): Payer: No Typology Code available for payment source

## 2010-11-27 ENCOUNTER — Encounter: Payer: Self-pay | Admitting: Internal Medicine

## 2010-11-27 DIAGNOSIS — Z7901 Long term (current) use of anticoagulants: Secondary | ICD-10-CM

## 2010-11-27 DIAGNOSIS — I4891 Unspecified atrial fibrillation: Secondary | ICD-10-CM

## 2010-11-27 LAB — CONVERTED CEMR LAB: POC INR: 1.8

## 2010-12-02 LAB — CBC
HCT: 24.3 % — ABNORMAL LOW (ref 36.0–46.0)
HCT: 24.5 % — ABNORMAL LOW (ref 36.0–46.0)
HCT: 25.7 % — ABNORMAL LOW (ref 36.0–46.0)
HCT: 26.9 % — ABNORMAL LOW (ref 36.0–46.0)
HCT: 27.1 % — ABNORMAL LOW (ref 36.0–46.0)
HCT: 27.5 % — ABNORMAL LOW (ref 36.0–46.0)
HCT: 29.9 % — ABNORMAL LOW (ref 36.0–46.0)
Hemoglobin: 10.1 g/dL — ABNORMAL LOW (ref 12.0–15.0)
Hemoglobin: 8.3 g/dL — ABNORMAL LOW (ref 12.0–15.0)
Hemoglobin: 8.6 g/dL — ABNORMAL LOW (ref 12.0–15.0)
Hemoglobin: 8.8 g/dL — ABNORMAL LOW (ref 12.0–15.0)
Hemoglobin: 9.2 g/dL — ABNORMAL LOW (ref 12.0–15.0)
Hemoglobin: 9.3 g/dL — ABNORMAL LOW (ref 12.0–15.0)
Hemoglobin: 9.5 g/dL — ABNORMAL LOW (ref 12.0–15.0)
MCHC: 34 g/dL (ref 30.0–36.0)
MCHC: 34.2 g/dL (ref 30.0–36.0)
MCHC: 34.2 g/dL (ref 30.0–36.0)
MCHC: 34.2 g/dL (ref 30.0–36.0)
MCHC: 34.4 g/dL (ref 30.0–36.0)
MCHC: 34.4 g/dL (ref 30.0–36.0)
MCHC: 34.8 g/dL (ref 30.0–36.0)
MCV: 96.2 fL (ref 78.0–100.0)
MCV: 96.6 fL (ref 78.0–100.0)
MCV: 96.8 fL (ref 78.0–100.0)
MCV: 96.9 fL (ref 78.0–100.0)
MCV: 97.2 fL (ref 78.0–100.0)
MCV: 97.9 fL (ref 78.0–100.0)
MCV: 98.1 fL (ref 78.0–100.0)
Platelets: 147 10*3/uL — ABNORMAL LOW (ref 150–400)
Platelets: 151 10*3/uL (ref 150–400)
Platelets: 152 10*3/uL (ref 150–400)
Platelets: 152 10*3/uL (ref 150–400)
Platelets: 160 10*3/uL (ref 150–400)
Platelets: 166 10*3/uL (ref 150–400)
Platelets: 190 10*3/uL (ref 150–400)
RBC: 2.47 MIL/uL — ABNORMAL LOW (ref 3.87–5.11)
RBC: 2.53 MIL/uL — ABNORMAL LOW (ref 3.87–5.11)
RBC: 2.63 MIL/uL — ABNORMAL LOW (ref 3.87–5.11)
RBC: 2.77 MIL/uL — ABNORMAL LOW (ref 3.87–5.11)
RBC: 2.81 MIL/uL — ABNORMAL LOW (ref 3.87–5.11)
RBC: 2.84 MIL/uL — ABNORMAL LOW (ref 3.87–5.11)
RBC: 3.11 MIL/uL — ABNORMAL LOW (ref 3.87–5.11)
RDW: 13.4 % (ref 11.5–15.5)
RDW: 13.4 % (ref 11.5–15.5)
RDW: 13.6 % (ref 11.5–15.5)
RDW: 13.6 % (ref 11.5–15.5)
RDW: 13.6 % (ref 11.5–15.5)
RDW: 13.8 % (ref 11.5–15.5)
RDW: 13.9 % (ref 11.5–15.5)
WBC: 10.3 10*3/uL (ref 4.0–10.5)
WBC: 10.4 10*3/uL (ref 4.0–10.5)
WBC: 7 10*3/uL (ref 4.0–10.5)
WBC: 7.9 10*3/uL (ref 4.0–10.5)
WBC: 8.2 10*3/uL (ref 4.0–10.5)
WBC: 9 10*3/uL (ref 4.0–10.5)
WBC: 9.9 10*3/uL (ref 4.0–10.5)

## 2010-12-02 LAB — CROSSMATCH
ABO/RH(D): AB POS
Antibody Screen: NEGATIVE

## 2010-12-02 LAB — DIFFERENTIAL
Basophils Absolute: 0 10*3/uL (ref 0.0–0.1)
Basophils Relative: 0 % (ref 0–1)
Eosinophils Absolute: 0 10*3/uL (ref 0.0–0.7)
Eosinophils Relative: 0 % (ref 0–5)
Lymphocytes Relative: 9 % — ABNORMAL LOW (ref 12–46)
Lymphs Abs: 1 10*3/uL (ref 0.7–4.0)
Monocytes Absolute: 0.8 10*3/uL (ref 0.1–1.0)
Monocytes Relative: 8 % (ref 3–12)
Neutro Abs: 8.5 10*3/uL — ABNORMAL HIGH (ref 1.7–7.7)
Neutrophils Relative %: 82 % — ABNORMAL HIGH (ref 43–77)

## 2010-12-02 LAB — RENAL FUNCTION PANEL
Albumin: 3.1 g/dL — ABNORMAL LOW (ref 3.5–5.2)
Albumin: 3.1 g/dL — ABNORMAL LOW (ref 3.5–5.2)
BUN: 35 mg/dL — ABNORMAL HIGH (ref 6–23)
BUN: 51 mg/dL — ABNORMAL HIGH (ref 6–23)
CO2: 23 mEq/L (ref 19–32)
CO2: 25 mEq/L (ref 19–32)
Calcium: 9.4 mg/dL (ref 8.4–10.5)
Calcium: 9.6 mg/dL (ref 8.4–10.5)
Chloride: 111 mEq/L (ref 96–112)
Chloride: 113 mEq/L — ABNORMAL HIGH (ref 96–112)
Creatinine, Ser: 2.03 mg/dL — ABNORMAL HIGH (ref 0.4–1.2)
Creatinine, Ser: 2.6 mg/dL — ABNORMAL HIGH (ref 0.4–1.2)
GFR calc Af Amer: 21 mL/min — ABNORMAL LOW (ref >60)
GFR calc Af Amer: 28 mL/min — ABNORMAL LOW (ref >60)
GFR calc non Af Amer: 18 mL/min — ABNORMAL LOW (ref >60)
GFR calc non Af Amer: 23 mL/min — ABNORMAL LOW (ref >60)
Glucose, Bld: 94 mg/dL (ref 70–99)
Glucose, Bld: 96 mg/dL (ref 70–99)
Phosphorus: 3.4 mg/dL (ref 2.3–4.6)
Phosphorus: 4 mg/dL (ref 2.3–4.6)
Potassium: 4.5 mEq/L (ref 3.5–5.1)
Potassium: 4.9 mEq/L (ref 3.5–5.1)
Sodium: 140 mEq/L (ref 135–145)
Sodium: 140 mEq/L (ref 135–145)

## 2010-12-02 LAB — PROTIME-INR
INR: 2.06 — ABNORMAL HIGH (ref 0.00–1.49)
INR: 2.28 — ABNORMAL HIGH (ref 0.00–1.49)
INR: 3.43 — ABNORMAL HIGH (ref 0.00–1.49)
INR: 4.21 — ABNORMAL HIGH (ref 0.00–1.49)
Prothrombin Time: 23 seconds — ABNORMAL HIGH (ref 11.6–15.2)
Prothrombin Time: 24.9 seconds — ABNORMAL HIGH (ref 11.6–15.2)
Prothrombin Time: 34.3 seconds — ABNORMAL HIGH (ref 11.6–15.2)
Prothrombin Time: 40.3 seconds — ABNORMAL HIGH (ref 11.6–15.2)

## 2010-12-02 LAB — UIFE/LIGHT CHAINS/TP QN, 24-HR UR
Albumin, U: DETECTED
Alpha 1, Urine: DETECTED — AB
Alpha 2, Urine: DETECTED — AB
Beta, Urine: DETECTED — AB
Free Kappa Lt Chains,Ur: 11.7 mg/dL — ABNORMAL HIGH (ref 0.04–1.51)
Free Lambda Lt Chains,Ur: 1.15 mg/dL — ABNORMAL HIGH (ref 0.08–1.01)
Gamma Globulin, Urine: DETECTED — AB
Total Protein, Urine: 14.5 mg/dL

## 2010-12-02 LAB — POCT I-STAT, CHEM 8
BUN: 101 mg/dL — ABNORMAL HIGH (ref 6–23)
BUN: 86 mg/dL — ABNORMAL HIGH (ref 6–23)
Calcium, Ion: 1.2 mmol/L (ref 1.12–1.32)
Calcium, Ion: 1.28 mmol/L (ref 1.12–1.32)
Chloride: 109 mEq/L (ref 96–112)
Chloride: 109 mEq/L (ref 96–112)
Creatinine, Ser: 4.3 mg/dL — ABNORMAL HIGH (ref 0.4–1.2)
Creatinine, Ser: 4.3 mg/dL — ABNORMAL HIGH (ref 0.4–1.2)
Glucose, Bld: 114 mg/dL — ABNORMAL HIGH (ref 70–99)
Glucose, Bld: 60 mg/dL — ABNORMAL LOW (ref 70–99)
HCT: 26 % — ABNORMAL LOW (ref 36.0–46.0)
HCT: 31 % — ABNORMAL LOW (ref 36.0–46.0)
Hemoglobin: 10.5 g/dL — ABNORMAL LOW (ref 12.0–15.0)
Hemoglobin: 8.8 g/dL — ABNORMAL LOW (ref 12.0–15.0)
Potassium: 4.8 mEq/L (ref 3.5–5.1)
Potassium: 6 mEq/L — ABNORMAL HIGH (ref 3.5–5.1)
Sodium: 133 mEq/L — ABNORMAL LOW (ref 135–145)
Sodium: 139 mEq/L (ref 135–145)
TCO2: 20 mmol/L (ref 0–100)
TCO2: 23 mmol/L (ref 0–100)

## 2010-12-02 LAB — BASIC METABOLIC PANEL
BUN: 80 mg/dL — ABNORMAL HIGH (ref 6–23)
CO2: 22 mEq/L (ref 19–32)
Calcium: 10 mg/dL (ref 8.4–10.5)
Chloride: 105 mEq/L (ref 96–112)
Creatinine, Ser: 4.04 mg/dL — ABNORMAL HIGH (ref 0.4–1.2)
GFR calc Af Amer: 13 mL/min — ABNORMAL LOW (ref >60)
GFR calc non Af Amer: 11 mL/min — ABNORMAL LOW (ref >60)
Glucose, Bld: 124 mg/dL — ABNORMAL HIGH (ref 70–99)
Potassium: 6.1 mEq/L — ABNORMAL HIGH (ref 3.5–5.1)
Sodium: 132 mEq/L — ABNORMAL LOW (ref 135–145)

## 2010-12-02 LAB — LIPID PANEL
Cholesterol: 103 mg/dL (ref 0–200)
HDL: 77 mg/dL (ref >39)
LDL Cholesterol: 22 mg/dL (ref 0–99)
Total CHOL/HDL Ratio: 1.3 RATIO
Triglycerides: 20 mg/dL (ref <150)
VLDL: 4 mg/dL (ref 0–40)

## 2010-12-02 LAB — CARDIAC PANEL(CRET KIN+CKTOT+MB+TROPI)
CK, MB: 3.9 ng/mL (ref 0.3–4.0)
CK, MB: 5.1 ng/mL — ABNORMAL HIGH (ref 0.3–4.0)
Relative Index: 3 — ABNORMAL HIGH (ref 0.0–2.5)
Relative Index: 4.3 — ABNORMAL HIGH (ref 0.0–2.5)
Total CK: 120 U/L (ref 7–177)
Total CK: 130 U/L (ref 7–177)
Troponin I: 0.02 ng/mL (ref 0.00–0.06)
Troponin I: 0.03 ng/mL (ref 0.00–0.06)

## 2010-12-02 LAB — STOOL CULTURE

## 2010-12-02 LAB — CK TOTAL AND CKMB (NOT AT ARMC)
CK, MB: 4.4 ng/mL — ABNORMAL HIGH (ref 0.3–4.0)
Relative Index: 3.8 — ABNORMAL HIGH (ref 0.0–2.5)
Total CK: 117 U/L (ref 7–177)

## 2010-12-02 LAB — CULTURE, BLOOD (ROUTINE X 2)
Culture: NO GROWTH
Culture: NO GROWTH

## 2010-12-02 LAB — VITAMIN B12: Vitamin B-12: 841 pg/mL (ref 211–911)

## 2010-12-02 LAB — IRON AND TIBC
Iron: 67 ug/dL (ref 42–135)
Saturation Ratios: 23 % (ref 20–55)
TIBC: 297 ug/dL (ref 250–470)
UIBC: 230 ug/dL

## 2010-12-02 LAB — URINALYSIS, MICROSCOPIC ONLY
Bilirubin Urine: NEGATIVE
Glucose, UA: NEGATIVE mg/dL
Hgb urine dipstick: NEGATIVE
Ketones, ur: NEGATIVE mg/dL
Leukocytes, UA: NEGATIVE
Nitrite: NEGATIVE
Protein, ur: NEGATIVE mg/dL
Specific Gravity, Urine: 1.013 (ref 1.005–1.030)
Urobilinogen, UA: 0.2 mg/dL (ref 0.0–1.0)
pH: 5.5 (ref 5.0–8.0)

## 2010-12-02 LAB — T3: T3, Total: 60.7 ng/dl — ABNORMAL LOW (ref 80.0–204.0)

## 2010-12-02 LAB — PROTEIN ELECTROPHORESIS, SERUM
Albumin ELP: 59.1 % (ref 55.8–66.1)
Alpha-1-Globulin: 4.6 % (ref 2.9–4.9)
Alpha-2-Globulin: 12.3 % — ABNORMAL HIGH (ref 7.1–11.8)
Beta 2: 5.2 % (ref 3.2–6.5)
Beta Globulin: 5.4 % (ref 4.7–7.2)
Gamma Globulin: 13.4 % (ref 11.1–18.8)
M-Spike, %: NOT DETECTED g/dL
Total Protein ELP: 5.4 g/dL — ABNORMAL LOW (ref 6.0–8.3)

## 2010-12-02 LAB — CLOSTRIDIUM DIFFICILE EIA

## 2010-12-02 LAB — RETICULOCYTES
RBC.: 3.21 MIL/uL — ABNORMAL LOW (ref 3.87–5.11)
Retic Count, Absolute: 38.5 10*3/uL (ref 19.0–186.0)
Retic Ct Pct: 1.2 % (ref 0.4–3.1)

## 2010-12-02 LAB — ABO/RH: ABO/RH(D): AB POS

## 2010-12-02 LAB — CREATININE, URINE, RANDOM: Creatinine, Urine: 86.3 mg/dL

## 2010-12-02 LAB — APTT: aPTT: 52 seconds — ABNORMAL HIGH (ref 24–37)

## 2010-12-02 LAB — COMPREHENSIVE METABOLIC PANEL
ALT: 26 U/L (ref 0–35)
AST: 20 U/L (ref 0–37)
Albumin: 3.6 g/dL (ref 3.5–5.2)
Alkaline Phosphatase: 45 U/L (ref 39–117)
BUN: 73 mg/dL — ABNORMAL HIGH (ref 6–23)
CO2: 22 mEq/L (ref 19–32)
Calcium: 9.8 mg/dL (ref 8.4–10.5)
Chloride: 109 mEq/L (ref 96–112)
Creatinine, Ser: 3.61 mg/dL — ABNORMAL HIGH (ref 0.4–1.2)
GFR calc Af Amer: 15 mL/min — ABNORMAL LOW (ref >60)
GFR calc non Af Amer: 12 mL/min — ABNORMAL LOW (ref >60)
Glucose, Bld: 108 mg/dL — ABNORMAL HIGH (ref 70–99)
Potassium: 4.8 mEq/L (ref 3.5–5.1)
Sodium: 141 mEq/L (ref 135–145)
Total Bilirubin: 0.9 mg/dL (ref 0.3–1.2)
Total Protein: 6.4 g/dL (ref 6.0–8.3)

## 2010-12-02 LAB — FERRITIN: Ferritin: 229 ng/mL (ref 10–291)

## 2010-12-02 LAB — TSH
TSH: 1.314 u[IU]/mL (ref 0.350–4.500)
TSH: 3.122 u[IU]/mL (ref 0.350–4.500)

## 2010-12-02 LAB — TROPONIN I: Troponin I: 0.02 ng/mL (ref 0.00–0.06)

## 2010-12-02 LAB — MAGNESIUM: Magnesium: 3.1 mg/dL — ABNORMAL HIGH (ref 1.5–2.5)

## 2010-12-02 LAB — FOLATE
Folate: >20 ng/mL
Folate: UNDETERMINED ng/mL

## 2010-12-02 LAB — HEMOCCULT GUIAC POC 1CARD (OFFICE): Fecal Occult Bld: NEGATIVE

## 2010-12-02 LAB — SODIUM, URINE, RANDOM: Sodium, Ur: 56 mEq/L

## 2010-12-02 LAB — T4: T4, Total: 6.2 ug/dL (ref 5.0–12.5)

## 2010-12-02 NOTE — Medication Information (Signed)
Summary: Coumadin Clinic  Anticoagulant Therapy  Managed by: Danella Penton, RN Referring MD: Minus Breeding MD PCP: Dr. Chriss Czar Supervising MD: Harrington Challenger MD, Nevin Bloodgood Indication 1: Atrial Fibrillation (ICD-427.31) Lab Used: [facility] Site: [facility] INR POC 1.8 INR RANGE 2 - 3           Allergies: No Known Drug Allergies  Anticoagulation Management History:      Positive risk factors for bleeding include an age of 16 years or older and presence of serious comorbidities.  The bleeding index is 'intermediate risk'.  Positive CHADS2 values include History of CHF, History of HTN, and Age > 50 years old.  The start date was 08/04/2006.  Anticoagulation responsible provider: Harrington Challenger MD, Nevin Bloodgood.  INR POC: 1.8.  Exp: 11/2011.    Anticoagulation Management Assessment/Plan:      The patient's current anticoagulation dose is Coumadin 3 mg tabs: Take as directed by [facility]madin Clinic.  The target INR is 2 - 3.  The next INR is due 12/11/2010.  Anticoagulation instructions were given to patient.  Results were reviewed/authorized by Danella Penton, RN.         Prior Anticoagulation Instructions: INR 1.8 Today take 2 tablets.  Then begin taking 1 1/2 tablets every day, except take 1 tablet on Tuesdays. Recheck in 2 weeks.   Current Anticoagulation Instructions: INR 1.8 Today take 2 tablets.  Then begin taking 1 1/2 tablets every day, except take 1 tablet on Tuesdays. Recheck in 2 weeks.

## 2010-12-02 NOTE — Medication Information (Signed)
Summary: rov/tm  Anticoagulant Therapy  Managed by: Danella Penton, RN Referring MD: Minus Breeding MD PCP: Dr. Chriss Czar Supervising MD: Harrington Challenger MD, Nevin Bloodgood Indication 1: Atrial Fibrillation (ICD-427.31) Lab Used: [facility] Site: [facility] INR RANGE 2 - 3  Dietary changes: no    Health status changes: no    Bleeding/hemorrhagic complications: no    Recent/future hospitalizations: no    Any changes in medication regimen? no    Recent/future dental: no  Any missed doses?: no       Is patient compliant with meds? yes       Allergies: No Known Drug Allergies  Anticoagulation Management History:      The patient is taking warfarin and comes in today for a routine follow up visit.  Positive risk factors for bleeding include an age of 75 years or older and presence of serious comorbidities.  The bleeding index is 'intermediate risk'.  Positive CHADS2 values include History of CHF, History of HTN, and Age > 41 years old.  The start date was 08/04/2006.  Anticoagulation responsible provider: Harrington Challenger MD, Nevin Bloodgood.  Cuvette Lot#: [id number].  Exp: 11/2011.    Anticoagulation Management Assessment/Plan:      The patient's current anticoagulation dose is Coumadin 3 mg tabs: Take as directed by [facility] Clinic.  The target INR is 2 - 3.  The next INR is due 12/11/2010.  Anticoagulation instructions were given to patient.  Results were reviewed/authorized by Danella Penton, RN.  She was notified by Danella Penton, RN.         Prior Anticoagulation Instructions: INR 1.7 Today take 1.5 pills then change dose to 1.5 pills everyday except 1 pill on Tuesdays and Saturdays. Recheck in 2 weeks.   Current Anticoagulation Instructions: INR 1.8 Today take 2 tablets.  Then begin taking 1 1/2 tablets every day, except take 1 tablet on Tuesdays. Recheck in 2 weeks.

## 2010-12-11 ENCOUNTER — Ambulatory Visit (INDEPENDENT_AMBULATORY_CARE_PROVIDER_SITE_OTHER): Payer: No Typology Code available for payment source | Admitting: *Deleted

## 2010-12-11 DIAGNOSIS — Z7901 Long term (current) use of anticoagulants: Secondary | ICD-10-CM

## 2010-12-11 DIAGNOSIS — I4891 Unspecified atrial fibrillation: Secondary | ICD-10-CM

## 2010-12-11 LAB — POCT INR: INR: 1.8

## 2010-12-23 ENCOUNTER — Ambulatory Visit (INDEPENDENT_AMBULATORY_CARE_PROVIDER_SITE_OTHER): Payer: No Typology Code available for payment source | Admitting: *Deleted

## 2010-12-23 DIAGNOSIS — Z7901 Long term (current) use of anticoagulants: Secondary | ICD-10-CM

## 2010-12-23 DIAGNOSIS — I4891 Unspecified atrial fibrillation: Secondary | ICD-10-CM

## 2010-12-23 LAB — POCT INR: INR: 1.8

## 2010-12-24 ENCOUNTER — Other Ambulatory Visit: Payer: Self-pay | Admitting: *Deleted

## 2010-12-24 MED ORDER — WARFARIN SODIUM 3 MG PO TABS: 3.0000 mg | ORAL_TABLET | ORAL | Status: DC

## 2011-01-03 ENCOUNTER — Encounter: Payer: Self-pay | Admitting: Cardiology

## 2011-01-06 ENCOUNTER — Encounter: Payer: Self-pay | Admitting: Cardiology

## 2011-01-06 ENCOUNTER — Ambulatory Visit (INDEPENDENT_AMBULATORY_CARE_PROVIDER_SITE_OTHER): Payer: No Typology Code available for payment source | Admitting: *Deleted

## 2011-01-06 ENCOUNTER — Ambulatory Visit (INDEPENDENT_AMBULATORY_CARE_PROVIDER_SITE_OTHER): Payer: No Typology Code available for payment source | Admitting: Cardiology

## 2011-01-06 DIAGNOSIS — I509 Heart failure, unspecified: Secondary | ICD-10-CM

## 2011-01-06 DIAGNOSIS — N259 Disorder resulting from impaired renal tubular function, unspecified: Secondary | ICD-10-CM

## 2011-01-06 DIAGNOSIS — I4891 Unspecified atrial fibrillation: Secondary | ICD-10-CM

## 2011-01-06 DIAGNOSIS — I10 Essential (primary) hypertension: Secondary | ICD-10-CM

## 2011-01-06 LAB — POCT INR: INR: 2.1

## 2011-01-06 NOTE — Patient Instructions (Signed)
Follow up in 2 months with Dr Percival Spanish Continue current medications

## 2011-01-06 NOTE — Assessment & Plan Note (Signed)
I reviewed her last echo. This was in 2009. There is no reason to suspect any reduction in her EF or contraindication to the above medications. She seems to be euvolemic. She will continue meds as listed.

## 2011-01-06 NOTE — Assessment & Plan Note (Signed)
I reviewed her basic metabolic profile done by her primary in March.  Her creat is Ok.

## 2011-01-06 NOTE — Progress Notes (Signed)
HPI The patient presents for followup. At the last appointment I started her on Tekturna and she had no problems related to this. Her daughter reports that her blood pressure has been better controlled. She's not having any chest pressure, neck or arm discomfort. She's not having any shortness of breath, PND or orthopnea. She's not having any palpitations, presyncope or syncope. She's had no weight gain or edema.  No Known Allergies  Current Outpatient Prescriptions  Medication Sig Dispense Refill  . aliskiren (TEKTURNA) 150 MG tablet Take 150 mg by mouth daily.        Marland Kitchen amLODipine (NORVASC) 10 MG tablet Take 10 mg by mouth daily.        . Cholecalciferol (VITAMIN D) 1000 UNITS capsule Take 1,000 Units by mouth daily.        Marland Kitchen donepezil (ARICEPT ODT) 10 MG disintegrating tablet Take 10 mg by mouth at bedtime.        . furosemide (LASIX) 40 MG tablet Take 40 mg by mouth as needed.        . hydrALAZINE (APRESOLINE) 10 MG tablet Take by mouth. Take 2 tablets 3 times a day       . lovastatin (MEVACOR) 20 MG tablet Take 20 mg by mouth at bedtime.        . Multiple Vitamin (MULTIVITAMIN) tablet Take 1 tablet by mouth daily.        Marland Kitchen warfarin (COUMADIN) 3 MG tablet Take 1 tablet (3 mg total) by mouth as directed.  50 tablet  3    Past Medical History  Diagnosis Date  . Paroxysmal atrial fibrillation   . Malleolar fracture   . Hypertension   . Dementia     mild  . Dyslipidemia   . Glaucoma   . Anemia   . Hx of hysterectomy   . Cardiomyopathy     presumed ischemic in the past with an EF of 35%. The most recent EF in 03-2008, however, was 55 -60%  . Bradycardia   . Chronic renal insufficiency     Past Surgical History  Procedure Date  . Hemorrhoid surgery     ROS: As stated in the HPI and negative for all other systems.  PHYSICAL EXAM BP 140/73  Pulse 51  Ht 5\' 1"  (1.549 m)  Wt 172 lb (78.019 kg)  BMI 32.50 kg/m2  GENERAL:  Well appearing NECK:  No jugular venous distention,  waveform within normal limits, carotid upstroke brisk and symmetric, no bruits, no thyromegaly LYMPHATICS:  No cervical, inguinal adenopathy LUNGS:  Clear to auscultation bilaterally BACK:  No CVA tenderness CHEST:  Unremarkable HEART:  PMI not displaced or sustained,S1 and S2 within normal limits, no S3, no S4, no clicks, no rubs, soft apical systolic murmurs ABD:  Flat, positive bowel sounds normal in frequency in pitch, no bruits, no rebound, no guarding, no midline pulsatile mass, no hepatomegaly, no splenomegaly EXT:  2 plus pulses throughout, no edema, no cyanosis no clubbing SKIN:  No rashes no nodules NEURO:  Cranial nerves II through XII grossly intact, motor grossly intact throughout PSYCH:  Cognitively intact, oriented to person place and time  ASSESSMENT AND PLAN

## 2011-01-06 NOTE — Assessment & Plan Note (Signed)
Her blood pressure seems to be well controlled on Tekturna.  She will continue the meds as listed

## 2011-01-23 ENCOUNTER — Other Ambulatory Visit: Payer: Self-pay | Admitting: Cardiology

## 2011-01-27 ENCOUNTER — Ambulatory Visit (INDEPENDENT_AMBULATORY_CARE_PROVIDER_SITE_OTHER): Payer: No Typology Code available for payment source | Admitting: *Deleted

## 2011-01-27 DIAGNOSIS — I4891 Unspecified atrial fibrillation: Secondary | ICD-10-CM

## 2011-01-27 LAB — POCT INR: INR: 2.1

## 2011-01-27 NOTE — Assessment & Plan Note (Signed)
[place] OFFICE NOTE   [name], [name]                        MRN:          [id number]  DATE:04/04/2007                            DOB:          [birth year]    REASON FOR CONSULTATION:  Hemoccult positive stool.   HISTORY:  This is an 77 year old African American female with a history  of hypertension, atrial fibrillation, congestive heart failure,  hyperlipidemia, and mild dementia. She is referred through the courtesy  of Dr.  Amanda Cockayne regarding hemoccult positive stools. Hemoccult positive  stool was identified by Dr.  Amanda Cockayne. Hemoccult testing was positive x3  last month. The patient has had intermittent anemia though her most  recent hemoglobin on Jan 27, 2007 was normal at 12.8. She denies melena  or hematochezia. She was recently placed on an iron supplement which  have turned her stools a bit dark. She is on Coumadin chronically for  atrial fibrillation. The patient was hospitalized at [facility] on  May 22, 2006, with atrial fibrillation with a rapid ventricular  response/pulmonary edema. Upon discharge, she was placed on Coumadin.  She denies prior history of GI problems or GI evaluations. She has not  had colonoscopy. She denies aspirin or nonsteroidal anti-inflammatory  drug use. She does not take a proton pump inhibitor. She is accompanied  today by her daughter.   PAST MEDICAL HISTORY:  Extensive as outlined above.   PAST SURGICAL HISTORY:  1. Hemorrhoidectomy.  2. Hysterectomy with oophorectomy.  3. Left ankle surgery.  4. Tonsillectomy.   ALLERGIES:  No known drug allergies.   CURRENT MEDICATIONS:  1. Multivitamin.  2. Coumadin as directed.  3. Furosemide 40 mg daily.  4. Lovastatin 20 mg daily.  5. Potassium chloride 20 mEq daily.  6. Aricept 10 mg daily.  7. Amiodarone 200 mg daily.  8. Travatan eye drops.  9. Patanol eye drops.  10.Lisinopril 40 mg daily.  11.Iron  65 mg daily.  12.Amlodipine 2.5 mg daily.  13.Spironolactone 25 mg daily.   FAMILY HISTORY:  No family history of gastrointestinal malignancy.   SOCIAL HISTORY:  The patient is widowed with one daughter and two sons.  She lives alone. She has a high school education. She does not smoke or  use alcohol.   REVIEW OF SYSTEMS:  Per diagnostic evaluation form.   PHYSICAL EXAMINATION:  Pleasant, elderly female in no acute distress.  Blood pressure is 160/80. Heart rate 56 and regular. Weight is 192.2  pounds. He is 5 feet, 2 inches in height.  HEENT: Sclerae anicteric. Conjunctivae are pink. Oral mucosa intact. No  adenopathy.  LUNGS:  Are clear to auscultation and percussion.  HEART: Is regular.  ABDOMEN: Soft, nontender, nondistended with good bowel sounds. No  organomegaly, mass or hernia.  EXTREMITIES: Are without significant edema.   IMPRESSION:  This is an 77 year old female with multiple medical  problems including a history of atrial fibrillation for which she is on  Coumadin. She is now referred for hemoccult positive stools. She has not  had prior GI evaluations. She has had transient anemia though is not  anemic at present. She is on iron.   RECOMMENDATIONS:  I have reviewed today with the patient and her  daughter a number of possibilities to account for hemoccult positive  stool, some of which are benign and insignificant and some of which are  quite significant. I also discussed the principle way of evaluating and  possibly treating for these entities. Namely, we discussed prospects of  colonoscopy and upper endoscopy. We discussed the nature of these  procedures in detail. As well, we discussed alternative diagnostic  strategies. After a complete discussion, I felt it was best to proceed  along these lines to exclude significant underlying pathology such as  neoplasia, ulcer disease or vascular malformations. This may be  particularly important as the patient will need  longterm Coumadin  therapy. I would like to perform the examinations off of Coumadin. We  will run this by Dr.  Percival Spanish for his approval. I suspect this will be  acceptable.     Docia Chuck. Henrene Pastor, MD  Electronically Signed    JNP/MedQ  DD: 04/06/2007  DT: 04/06/2007  Job #: [id number]   cc:   Chriss Czar, MD  Minus Breeding, MD, [facility]

## 2011-01-27 NOTE — Assessment & Plan Note (Signed)
[place] OFFICE NOTE   [name], [name]                        MRN:          [id number]  DATE:05/31/2007                            DOB:          07-23-1923    PRIMARY CARE PHYSICIAN:  Dr. Chriss Czar   REASON FOR PRESENTATION:  Evaluate patient for hypertension and atrial  fibrillation.   HISTORY OF PRESENT ILLNESS:  Patient is 77 years old.  She presents for  followup for the above.  She has had no problems since I last saw her.  In particular, she has not had any palpitations and denies any  presyncope or syncope.  Her blood pressure seems to be elevated.  She  takes it sometimes at home and it has probably been in the 170s, 180s.  I do not have a record of this.  She has had her blood work followed by  Dr. Amanda Cockayne and does not have any problems with her spironolactone.  It  is difficult to know her blood pressure, as apparently she has had some  white coat hypertension.  She does report that, at times, she will get  her blood pressure checked and it is in the 120s.  She denies any chest  discomfort, neck or arm discomfort.   PAST MEDICAL HISTORY:  Atrial fibrillation, malleolar fracture, status  post open reduction and internal fixation; hypertension, mild dementia,  dyslipidemia, glaucoma, anemia, hysterectomy, cardiomyopathy (presumed  ischemic with an EF of approximately 35%), bradycardia.   ALLERGIES:  None.   CURRENT MEDICATIONS:  1. Multivitamin.  2. Coumadin.  3. Furosemide 40 mg daily.  4. Lovastatin 20 mg daily.  5. Klor-Con 20 mEq daily.  6. Aricept 10 mg daily.  7. Amiodarone 200 mg daily.  8. Travatan.  9. Patanol eye drops.  10.Lisinopril 40 mg daily.  11.Iron.  12.Amlodipine 10 mg daily.  13.Spironolactone 25 mg daily.   REVIEW OF SYSTEMS:  As stated in the HPI, and otherwise negative for  other systems.   PHYSICAL EXAMINATION:  The patient is in no distress.  Blood  pressure  176/75, heart rate 56 and regular.  NECK:  No jugular venous distention at 90 degrees.  Carotid upstroke  brisk and symmetric.  No bruits, no thyromegaly.  LYMPHATICS:  No adenopathy.  LUNGS:  Clear to auscultation bilaterally.  BACK:  No costovertebral angle tenderness.  CHEST:  Unremarkable.  HEART:  PMI not displaced or sustained.  S1 and S2 within normal limits.  No S3, no S4, no clicks, no rubs, no murmurs.  ABDOMEN:  Obese, positive bowel sounds, normal in frequency and pitch.  No bruits, no rebound, no guarding, no midline pulsatile mass, no  organomegaly.  SKIN:  No rashes, no nodules.  EXTREMITIES:  Two-plus pulses throughout, no clubbing, no edema.  NEUROLOGIC:  Grossly intact.   ASSESSMENT AND PLAN:  1. Hypertension:  Blood pressure is still not controlled, here in the      office.  However, I am not sure if this is white coat hypertension.      I have asked her  daughter to keep a blood pressure diary and they      have a home aide, who can help with this.  If her blood pressure is      still not at the target of 140/90 or lower, I would increase her      spironolactone.  2. Atrial fibrillation:  She seems to be having no symptomatic      dysrhythmias.  No further evaluation is planned.  She will remain      on the Coumadin and rate control.  3. Followup:  I will see her back in about six weeks.  She is going to      need followup labs on her amiodarone soon.     Minus Breeding, MD, [facility]  Electronically Signed    JH/MedQ  DD: 05/31/2007  DT: 06/01/2007  Job #: [id number]

## 2011-01-27 NOTE — Assessment & Plan Note (Signed)
[place] OFFICE NOTE   [name], [name]                        MRN:          [id number]  DATE:10/30/2008                            DOB:          09-Aug-1923    PRIMARY CARE PHYSICIAN:  Chriss Czar, MD   REASON FOR PRESENTATION:  Evaluate the patient with hypertension, atrial  fibrillation, and previous cardiomyopathy.   HISTORY OF PRESENT ILLNESS:  The patient is 77 years old.  She presents  for followup of the above.  She has done well since I last saw her.  She  denies any chest discomfort, neck or arm discomfort.  She has had no  palpitations, presyncope, or syncope.  She has had no PND or orthopnea.  She has taken her meds as listed.   PAST MEDICAL HISTORY:  1. Paroxysmal atrial fibrillation.  2. Malleolar fractures status post open reduction and internal      fixation.  3. Hypertension.  4. Mild dementia.  5. Dyslipidemia.  6. Glaucoma.  7. Anemia.  8. Hysterectomy.  9. Cardiomyopathy (presumed ischemic in the past with an EF of 35%.      The most recent ejection fraction in July 2009, however, was 55-      60%.)  10.Bradycardia.   ALLERGIES:  None.   MEDICATIONS:  1. Amlodipine 10 mg daily.  2. Lisinopril 40 mg daily.  3. Aricept 10 mg daily.  4. Amiodarone 200 mg daily.  5. Coumadin.  6. Lovastatin 20 mg daily.  7. Spironolactone.  8. Multivitamin.  9. Lasix 20 mg daily.  10.Potassium 10 mEq daily.   REVIEW OF SYSTEMS:  As stated in the HPI, and otherwise negative for all  other systems.   PHYSICAL EXAMINATION:  GENERAL:  The patient is pleasant and in no  distress.  VITAL SIGNS:  Blood pressure 160/80, heart rate 47 and regular, weight  194 pounds, and body mass index 39.  HEENT:  Eyelids unremarkable; pupils equal, round, and reactive to  light; fundi not visualized, oral mucosa unremarkable.  NECK:  No jugular venous distention at 45 degrees; carotid upstroke  brisk and  symmetric; no bruits, no thyromegaly.  LYMPHATICS:  No cervical, axillary, or inguinal adenopathy.  LUNGS:  Clear to auscultation bilaterally.  BACK:  No costovertebral angle tenderness.  CHEST:  Unremarkable.  HEART:  PMI not displaced or sustained; S1 and S2 within normal limits;  no S3, no S4; no clicks, no rubs, no murmurs.  ABDOMEN:  Flat; positive bowel sounds; normal in frequency and pitch; no  bruits, no rebound, no guarding; no midline pulsatile mass; no  hepatomegaly, splenomegaly.  SKIN:  No rashes.  No nodules.  EXTREMITIES:  Pulses 2+, no edema.   EKG; sinus bradycardia, rate 47, left axis deviation, intraventricular  conduction block, no change from previous.   ASSESSMENT AND PLAN:  1. Cardiomyopathy.  The patient's ejection fraction has improved.  She      has had no new symptoms.  No further cardiovascular testing is      suggested.  She will continue the meds as  listed.  2. Hypertension.  Blood pressure is elevated today for the first time.      I repeated it and verified.  Since I have not seen this before, I      plan on having her check a blood pressure at home and keeping a      diary and letting me know.  If it remains elevated, I will titrate      her meds.  3. Atrial fibrillation.  She remains on the amiodarone.  She has had      no symptomatic paroxysms.  She remained on the Coumadin.  I will      check a TSH and thyroid profile.  She understands the need for her      eye examinations and the other symptoms, that can occur with      amiodarone toxicity.  She has had no symptoms consistent with this.  4. Followup.  She would like to be seen in every 3 months and I will      arrange this.     Minus Breeding, MD, [facility]  Electronically Signed    JH/MedQ  DD: 10/30/2008  DT: 10/31/2008  Job #: [id number]   cc:   Chriss Czar, MD

## 2011-01-27 NOTE — Assessment & Plan Note (Signed)
[place] OFFICE NOTE   [name], [name]                        MRN:          [id number]  DATE:04/04/2007                            DOB:          1922/10/01    PRIMARY CARE PHYSICIAN:  Chriss Czar, M.D.   REASON FOR PRESENTATION:  Evaluate patient with hypertension and atrial  fibrillation.   HISTORY OF PRESENT ILLNESS:  The patient is a pleasant 77 year old  African-American female with the above problems.  Since I last saw her,  she has been doing relatively well.  She has actually not be noticing  any palpitations.  She has had no presyncope or syncope.  She is having  some mild lower extremity swelling.  She is not having any new shortness  of breath and has no new PND or orthopnea.  She has not been having any  chest discomfort, neck or arm discomfort.  She does take her blood  pressure at home, and she says it is in the Q000111Q or 0000000 systolic.  Consistently in the office here, it has been in the 180s.  This is  despite going up on her Norvasc at the last visit.   PAST MEDICAL HISTORY:  1. Atrial fibrillation.  2. Malleolar fracture status post open reduction and internal      fixation.  3. Hypertension.  4. Mild dementia.  5. Dyslipidemia.  6. Glaucoma.  7. Anemia.  8. Hysterectomy.  9. Cardiomyopathy (presumed ischemic; EF is approximately 35%).  10.Bradycardia.   ALLERGIES:  None.   CURRENT MEDICATIONS:  1. Multivitamin.  2. Coumadin.  3. Furosemide 40 mg daily.  4. Lovastatin 20 mg daily.  5. Klor-Con 20 mEq daily.  6. Aricept 10 mg daily.  7. Amiodarone 200 mg daily.  8. Lisinopril 40 mg daily.  9. Iron 65 mg daily.  10.Amlodipine 10 mg daily.   REVIEW OF SYSTEMS:  As stated in the HPI and otherwise negative for  other systems.   PHYSICAL EXAMINATION:  GENERAL:  The patient is in no distress.  VITAL SIGNS:  Blood pressure 182/90, heart rate 63 and regular.  Body  mass  index 35.  Weight 193 pounds.  HEENT:  Eyes unremarkable.  Pupils equal, round and reactive to light.  Fundi not visualized.  Oral mucosa unremarkable.  NECK:  No jugular venous distention at 45 degrees.  Carotid upstroke  brisk and symmetric.  No bruits, no thyromegaly.  LYMPHATICS:  No cervical, axillary or inguinal adenopathy.  LUNGS:  Clear to auscultation bilaterally.  BACK:  No costovertebral angle tenderness.  CHEST:  Unremarkable.  HEART:  PMI not displaced or sustained.  S1 and S2 within normal limits.  No S3, no S4, no clicks, no rubs, no murmurs.  ABDOMEN:  Obese.  Positive bowel sounds.  Normal in frequency and pitch.  No bruits, rebound or guarding.  No midline pulse.  No mass,  hepatomegaly, splenomegaly.  SKIN:  No rashes.  No nodules.  EXTREMITIES:  There were 2+ pulses throughout.  No clubbing, cyanosis,  or edema.  NEUROLOGIC:  Oriented to person,  place and time.  Cranial nerves II-XII  grossly intact.  Motor grossly intact.   ELECTROCARDIOGRAM:  Sinus rhythm, premature atrial contractions, left  axis deviation, intraventricular conduction delay.  No acute ST-T wave  changes.   ASSESSMENT AND PLAN:  1. Hypertension.  Blood pressure is still not controlled.  Therefore,      I am going to add Spironolactone 25 mg daily.  I spoke with Dr.      Amanda Cockayne today.  Will get a BMET in 1 week.  Will get one again in a      month.  I have asked him to check it 3-4 times per year.  He can      keep an eye on her blood pressure until I see her again.  She could      have an increased dose of the Spironolactone if this does not help      to control her blood pressure.  2. Atrial fibrillation.  She is not having any further dysrhythmias.      She will continue on the amiodarone.  Will make sure through      routine follow up that she has the necessary liver, thyroid,      pulmonary, optic and neuromuscular evaluations.  3. Diastolic heart failure.  This is controlled with  management of      salt, volume restriction, blood pressure control and rhythm      management.   FOLLOWUP:  I will see the patient again in about 3 months or sooner if  needed.     Minus Breeding, MD, [facility]  Electronically Signed    JH/MedQ  DD: 04/04/2007  DT: 04/04/2007  Job #: [id number]   cc:   Chriss Czar, M.D.

## 2011-01-27 NOTE — Assessment & Plan Note (Signed)
[place] OFFICE NOTE   [name], [name]                        MRN:          [id number]  DATE:12/05/2007                            DOB:          Nov 14, 1922    PRIMARY CARE PHYSICIAN:  Dr. Chriss Czar.   REASON FOR PRESENTATION:  Evaluation patient with that hypertension,  atrial fibrillation.   HISTORY OF PRESENT ILLNESS:  The patient is a pleasant 77 year old.  She  presents for routine followup.  She has done well since I last saw her.  She has had no problems with recurrent palpitations.  She did have one  episode after eating at a restaurant and getting very lightheaded. She  went to Urgent Care but was said to be fine per her daughter.  She has  not felt any palpitations, presyncope, or syncope and had no chest  discomfort at that time.  She has not any shortness of breath.  She had  a little weakness in her legs, but this seems to be baseline.   PAST MEDICAL HISTORY:  1. Atrial fibrillation.  2. Malleolar fracture status post open reduction internal fixation.  3. Hypertension.  4. Mild dementia.  5. Dyslipidemia.  6. Glaucoma.  7. Anemia.  8. Hysterectomy.  9. Cardiomyopathy (presumed ischemic with an EF of approximately 35%).  10.Bradycardia.   ALLERGIES:  None.   MEDICATIONS:  1. Multivitamin.  2. Coumadin.  3. Furosemide 40 mg daily.  4. Lovastatin 40 mg daily.  5. Klor-Con 20 mg daily.  6. Aricept 10 mg daily.  7. Amiodarone 200 mg daily.  8. Travatan.  9. Panthenol eye drops.  10.Lisinopril 40 mg daily.  11.Iron.  12.Amlodipine 10 mg daily.  13.Spironolactone 25 mg daily.   REVIEW OF SYSTEMS:  As stated in the HPI, otherwise negative for other  systems.   PHYSICAL EXAMINATION:  GENERAL:  The patient is in no distress.  VITAL SIGNS:  Blood pressure 158/74, heart rate 52 and regular, weight  198 pounds, body mass index 36.  NECK:  No jugular distention at 45 degrees.  Carotid upstroke brisk and  symmetrical.  No bruits, thyromegaly.  LYMPHATICS:  No cervical, axillary, inguinal adenopathy.  LUNGS:  Clear to auscultation bilaterally.  BACK:  No costovertebral angle tenderness.  CHEST:  Unremarkable.  HEART:  PMI not displaced or sustained, S1-S2 within normal limits.  No  S3-S4, a 2/6 apical systolic murmur heard best at the right upper  sternal border, no diastolic murmurs.  ABDOMEN:  Obese, positive bowel sounds normal in frequency and pitch. No  bruits, rebound, guarding or midline pulsatile mass.  No organomegaly.  SKIN:  No rashes, no nodules.  EXTREMITIES:  2+ pulses, trace bilateral lower extremity edema.  No  cyanosis or clubbing.  NEUROLOGIC:  Grossly intact.   EKG:  Sinus bradycardia, interventricular conduction delay, left axis  deviation, left anterior fascicular block.   ASSESSMENT AND PLAN:  1. Atrial fibrillation.  I do not think the patient has had any      symptomatic paroxysms.  She does have a bradycardic rhythm, but  she      tolerates this.  At this point, I will make no change to her      medical regimen for this.  She will remain on the Coumadin.  2. Cardiomyopathy.  I have not reassessed her ejection fraction in      quite a while.  I am also appreciating a murmur I did not described      before.  I am going to get an echocardiogram to further evaluate      her EF and the degree of aortic stenosis which I expect she has at      least mildly.  3. Hypertension.  Blood pressure is elevated here.  However, she      reports in a blood pressure diary at home that it is in the      120/70s.  Her daughter corroborates this.  Therefore, I will make      no change to her regimen.  4. Followup.  She likes to be seen every 3-4 months, and I will      accommodate this.     Minus Breeding, MD, [facility]  Electronically Signed    JH/MedQ  DD: 12/05/2007  DT: 12/05/2007  Job #: [id number]   cc:   Chriss Czar, MD

## 2011-01-27 NOTE — Assessment & Plan Note (Signed)
[place] OFFICE NOTE   [name], [name]                        MRN:          [id number]  DATE:07/26/2007                            DOB:          27-May-1923    PRIMARY CARE PHYSICIAN:  Chriss Czar, MD   REASON FOR PRESENTATION:  Evaluate patient with hypertension and atrial  fibrillation .   HISTORY OF PRESENT ILLNESS:  The patient is 77 years old.  She presents  for follow up.  She does bring a blood pressure diarrhea.  She has had  very good blood pressure control.  She says she is coming very well.  She is in sinus bradycardia.  She is not noticing any lightheadedness or  presyncope.  She has had no palpitations.  She has had no chest  discomfort or shortness of breath.   PAST MEDICAL HISTORY:  1. Atrial fibrillation.  2. Malleolar fracture status post open reduction internal fixation.  3. Hypertension.  4. Mild dementia.  5. Dyslipidemia.  6. Glaucoma.  7. Anemia.  8. Hysterectomy.  9. Cardiomyopathy (presumed ischemic with an EF of approximately 35%).  10.Bradycardia.   ALLERGIES:  None.   MEDICATIONS:  1. Multivitamin.  2. Coumadin.  3. Furosemide 40 mg daily.  4. Lovastatin 20 mg daily.  5. Klor-Con 20 mEq daily.  6. Aricept 10 mg daily.  7. Amiodarone 200 mg daily.  8. Travatan.  9. Patanol.  10.Lisinopril 40 mg daily.  11.Iron 65 mg daily.  12.Amlodipine 10 mg daily.  13.Spironolactone 25 mg daily.   REVIEW OF SYSTEMS:  As stated in the HPI, otherwise, negative for other  systems.   PHYSICAL EXAMINATION:  GENERAL:  The patient is in no distress.  VITAL SIGNS:  Blood pressure 152/71, heart rate 50 and regular, weight  193 pounds, body mass index 35.  HEENT:  Eyes:  Unremarkable.  Pupils equal, round and reactive to light.  Fundi not visualized.  Oral mucosa normal.  NECK:  No jugular venous distention at 45 degrees.  Carotid upstrokes  brisk and symmetric.  No bruits or  thyromegaly.  LYMPHATICS.  No cervical, axillary or inguinal adenopathy.  LUNGS:  Clear to auscultation bilaterally.  CHEST:  Unremarkable.  HEART:  PMI not displaced or sustained.  S1, S2 within normal limits.  No S3, S4, no clicks, rubs, murmurs.  ABDOMEN:  Obese, positive bowel sounds.  Normal in frequency and pitch.  No bruits, rebound, guarding.  No midline pulsatile mass.  No  organomegaly.  SKIN:  No rashes, no nodules.  EXTREMITIES:  Pulses 2+ without edema.  NEUROLOGICAL:  Oriented to person, place and time.  Cranial nerves II-  XII grossly intact. Motor grossly intact.   STUDIES:  EKG sinus bradycardia, rate 43, left bundle branch block, left  axis deviation.   ASSESSMENT/PLAN:  1. Hypertension.  Her blood pressure is much better controlled.  I am      happy with this.  I will make no changes to her medical regimen      based on this.  2. Atrial fibrillation.  She  is maintained in sinus bradycardia.  She      will remain on rate control and anticoagulation.   FOLLOWUP:  She prefers to be seen at least every three months.  That is,  her daughter prefers that she be seen at least every three months.  I  will go ahead and facilitate this.     Minus Breeding, MD, [facility]  Electronically Signed    JH/MedQ  DD: 07/26/2007  DT: 07/27/2007  Job #: [id number]   cc:   Chriss Czar, MD

## 2011-01-27 NOTE — Assessment & Plan Note (Signed)
[place] OFFICE NOTE   [name], [name]                        MRN:          [id number]  DATE:03/22/2008                            DOB:          June 27, 1923    PRIMARY CARE PHYSICIAN:  Dr. Chriss Czar.   REASON FOR PRESENTATION:  Evaluate the patient with hypertension, atrial  fibrillation, and previous cardiomyopathy.   HISTORY OF PRESENT ILLNESS:  The patient presents for followup of the  above.  Since I last saw her, she has done quite well.  She did have an  echocardiogram today.  I have the preliminary results, demonstrating  that her ejection fraction might be slightly improved, but I need to  review this.   She has had no symptoms since I last saw her.  She denies any  palpitations, presyncope, or syncope.  She has had no chest discomfort  or neck or arm discomfort.  She is not having any new shortness of  breath.  She denies any PND or orthopnea.  She has some mild leg  weakness, but she gets along fairly well.   PAST MEDICAL HISTORY:  1. Paroxysmal atrial fibrillation.  2. Malleolar fracture status post open reduction internal fixation.  3. Hypertension.  4. Mild dementia.  5. Dyslipidemia.  6. Glaucoma.  7. Anemia.  8. Hysterectomy.  9. Cardiomyopathy (presumed ischemic in the past with an EF of 35%).  10.Bradycardia.   ALLERGIES:  None.   MEDICATIONS:  1. Klor-Con 20 mEq daily.  2. Amlodipine 10 mg daily.  3. Lisinopril 40 mg daily.  4. Aricept 10 mg daily.  5. Furosemide 40 mg daily.  6. Iron.  7. Amiodarone 200 mg daily.  8. Coumadin.  9. Lovastatin 20 mg daily.  10.Spironolactone 25 mg daily.  11.Multivitamin.   REVIEW OF SYSTEMS:  As stated in the HPI and otherwise negative for  other systems.   PHYSICAL EXAMINATION:  GENERAL:  The patient is in no distress.  VITAL SIGNS:  Blood pressure 135/68, heart rate 56 and regular, and  weight 187 pounds.  HEENT:  Eyelids  are unremarkable, pupils are equal, round, and reactive  to light, fundi not visualized, oral mucosa is unremarkable.  NECK:  No jugular venous distention at 45 degrees, carotid upstroke  brisk and symmetrical.  No bruits and no thyromegaly.  LYMPHATICS:  No cervical, axillary, or inguinal adenopathy.  LUNGS:  Clear to auscultation bilaterally.  BACK:  No costovertebral angle tenderness.  CHEST:  Unremarkable.  HEART:  PMI not displaced or sustained, S1 and S2 within normal limits.  No S3, no S4, no clicks, no rubs, and no murmurs.  ABDOMEN:  Obese.  Positive bowel sounds.  Normal in frequency and pitch.  No bruits, no rebound, no guarding, no midline pulsatile mass, and no  organomegaly.  SKIN:  No rashes and no nodules.  EXTREMITIES:  Pulses 2+.  No edema.   ASSESSMENT AND PLAN:  1. Cardiomyopathy.  I will review the final results of her      echocardiogram.  It looks like her ejection fraction has  improved.      She is not having any new symptoms of dyspnea.  She should continue      on her medications as listed.  2. Hypertension.  Blood pressure is finally well controlled on the      current regimen.  She is on spironolactone and understands that she      should have her labs checked about 3-4 times a year.  She is due to      see Dr. Amanda Cockayne soon and will get labs to include BMET checking on      her potassium in particular with the spironolactone.  She will      continue on the current regimen.  3. Atrial fibrillation.  She is having no paroxysms of this at least      symptomatically.  She will remain on the dose of amiodarone.  I      will ask Dr. Amanda Cockayne to check TSH and liver profile as well.  She      understands the need to get eye examinations.  If she has not had      pulmonary function testing or the above labs, when I see her next I      will arrange for this to be done.  She will remain on the Coumadin      and followed by Dr. Amanda Cockayne.  4. Followup.  Her daughter  wants her to be seen every 3 months, and I      will arrange for this.     Minus Breeding, MD, [facility]  Electronically Signed    JH/MedQ  DD: 03/22/2008  DT: 03/23/2008  Job #: [id number]   cc:   Chriss Czar, MD

## 2011-01-27 NOTE — Assessment & Plan Note (Signed)
[place] OFFICE NOTE   [name], [name]                        MRN:          [id number]  DATE:06/21/2008                            DOB:          [birth year]    PRIMARY CARE:  Chriss Czar, MD   REASON FOR PRESENTATION:  Evaluate the patient with hypertension, atrial  fibrillation, and previous cardiomyopathy.   HISTORY OF PRESENT ILLNESS:  The patient is a very pleasant 77 year old  African American female with the above problems.  She has done well  since I last saw her.  She has been taking the meds as listed, though I  am trying to clarify exactly the dose of Lasix.  She did have some renal  insufficiency on followup labs after her last appointment.  However,  repeat labs demonstrated this to be much improved.   She has had no PND or orthopnea.  She has had no chest pain or shortness  of breath.  She has had no palpitation, presyncope, or syncope.   PAST MEDICAL HISTORY:  Paroxysmal atrial fibrillation, malleolar  fracture status post open reduction and internal fixation, hypertension,  mild dementia, dyslipidemia, glaucoma, anemia, hysterectomy,  cardiomyopathy (presumed ischemic in the past with an EF of 35%),  bradycardia.   ALLERGIES:  None.   MEDICATIONS:  1. Potassium 20 mEq daily.  2. Amlodipine 10 mg daily.  3. Lisinopril 40 mg daily.  4. Aricept 10 mg daily.  5. Furosemide 40 mg daily.  6. Amiodarone 200 mg daily.  7. Coumadin.  8. Lovastatin 20 mg daily.  9. Spironolactone 25 mg daily.  10.Multivitamin.   REVIEW OF SYSTEMS:  As stated in the HPI and otherwise for other  systems.   PHYSICAL EXAMINATION:  GENERAL:  The patient is in no distress.  VITAL SIGNS:  Blood pressure 130/70, heart 55 and regular, weight 192  pounds.  HEENT:  Eyelids are unremarkable; pupils are round, equal and reactive  to light; fundi not visualized; oral mucosa unremarkable.  NECK:  No jugular  venous distention at 45 degrees; carotid upstroke  brisk and symmetrical.  No bruits, no thyromegaly.  LYMPHATICS:  No adenopathy.  LUNGS:  Clear to auscultation bilaterally.  BACK:  No costovertebral angle tenderness.  CHEST:  Unremarkable.  HEART:  PMI not displaced or sustained; S1 and S2 within normal limits;  no S3, no S4, no clicks, no rubs, no murmurs.  ABDOMEN:  Flat, positive bowel sounds normal in frequency and pitch; no  bruits, no rebound, no guarding or midline pulsatile mass.  No  organomegaly.  SKIN:  No rash, no nodules.  EXTREMITIES:  2+ pulses, no edema.   EKG sinus rhythm, intraventricular conduction block, left ventricle  hypertrophy, left axis deviation.   ASSESSMENT AND PLAN:  1. Cardiomyopathy.  The patient's ejection fraction was about 55-60%      on the most recent echo.  She is doing well.  At this point, no      further cardiovascular testing is suggested.  2. Hypertension.  Blood pressure is controlled and she will continue  with meds as listed.  3. Renal insufficiency.  She has labs drawn routinely by Dr. Amanda Cockayne      and I will defer.  I would like to clarify much Lasix she is      taking.  Her dose at the time of her last labs she had on May 31, 2008, was seemed to be the right dose.  This was 20 mg of Lasix      and 10 of potassium.  4. Atrial fibrillation.  She had no symptomatic paroxysms of this.      She will remain on the Coumadin, however.  5. Followup.  Her daughter would like her to be seen every 3 months      and I will accommodate this.  Of note, we will give her the flu      shot today.     Minus Breeding, MD, [facility]  Electronically Signed    JH/MedQ  DD: 06/21/2008  DT: 06/22/2008  Job #: [id number]   cc:   Chriss Czar, MD

## 2011-01-30 NOTE — Discharge Summary (Signed)
[name], [name]               ACCOUNT NO.:  [id number]   MEDICAL RECORD NO.:  [id number]          PATIENT TYPE:  INP   LOCATION:  [id number]                         FACILITY:  [facility]   PHYSICIAN:  Minus Breeding, MD, FACCDATE OF BIRTH:  07/12/1924   DATE OF ADMISSION:  05/22/2006  DATE OF DISCHARGE:  06/02/2006                                 DISCHARGE SUMMARY   ADDENDUM/CORRECTION TO Malissa Hippo APPOINTMENT   [name] has follow-up with [facility] on  June 09, 2006 at 11:30 a.m. She will be connected to a Holter monitor  at that time. She will also have a complete metabolic panel on September 26  __________ .     ______________________________  Murray Hodgkins, ANP    ______________________________  Minus Breeding, MD, [facility]    CB/MEDQ  D:  06/02/2006  T:  06/03/2006  Job:  [id number]

## 2011-01-30 NOTE — H&P (Signed)
[name], [name]               ACCOUNT NO.:  [id number]   MEDICAL RECORD NO.:  [id number]          PATIENT TYPE:  EMS   LOCATION:  MAJO                         FACILITY:  [facility]   PHYSICIAN:  Annita Brod, M.D.DATE OF BIRTH:  07/12/1924   DATE OF ADMISSION:  05/22/2006  DATE OF DISCHARGE:                                HISTORY & PHYSICAL   PRIMARY CARE PHYSICIAN:  Dr. Lucretia Kern.   CHIEF COMPLAINT:  Shortness of breath.   HISTORY OF PRESENT ILLNESS:  The patient is a 77 year old African-American  female, with a past medical history of mild dementia, hypertension,  hyperlipidemia and a recent displaced medial malleolar fracture (status post  ORIF done a little over a month ago).  She was discharged to [facility]  for skilled nursing, and then earlier on in the evening started developing  shortness of breath and was brought into the emergency room.   In the emergency room she was found to be in rapid atrial fibrillation as  well as flash pulmonary edema.  The patient was then given doses of IV Lasix  as well as placed on BiPap for oxygen support. She also was started on a  Cardizem drip at 15 mcg/hr.  The patient's heart rate initially came down,  but then has stayed up in the anywhere from the 140's to 160's.  After  receiving Lasix 40 mg, she has already put out over a liter of fluids.  In  addition, while on BiPap her breathing has become more comfortable, and she  has been slowly weaned down.   REVIEW OF SYSTEMS:  Currently, the patient is still on BiPap.  She denies  any shortness of breath or chest pain. She denies any headaches or vision  changes; no dysphagia, no palpitations, no abdominal pain.  No hematuria,  dysuria, constipation or diarrhea.  No focal extremity numbness, weakness or  pain.  The review of systems is otherwise negative.   PAST MEDICAL HISTORY:  1. Recent displaced malleolar fracture of the left malleolus, status post      ORIF.  2. History  of chronic atrial fibrillation.  3. History of hypertension.  4. Dementia.  5. Hyperlipidemia.  6. Glaucoma.  7. Acute blood loss anemia.   MEDICATIONS:  (Based on records sent in from [facility])  1. Cardizem 30 mg p.o. t.i.d.  2. Lasix 20 mg p.o. daily.  3. Previously on Zyrtec and metoprolol, these have been recently      discontinued.  4. Previously on Cozaar and lisinopril, these have been recently      discontinued.  5. Lovastatin 20 mg p.o. daily.  6. She was on Norvasc 10 mg (this was recently discontinued).  7. Iron 325 mg p.o. daily.  8. Multivitamin p.o. daily.  9. K-Dur 20 mEq p.o. daily.  10.Travatan 0.004% one drop each eye nightly.  11.Patanol 0.1% one drop each eye b.i.d.  12.Aricept 10 mg p.o. q.h.s.  13.Norco 5/325 mg p.o. q.4h. p.r.n.  14.Tylenol 650 mg p.o. q.4h. p.r.n.  15.Zyrtec 10 mg p.o. daily.   ALLERGIES:  The patient has NO  KNOWN DRUG ALLERGIES.   SOCIAL HISTORY:  She currently is at the [facility] for skilled nursing.  She has a daughter who lives in town.  No alcohol, tobacco or drug use  reported.   FAMILY HISTORY:  Noncontributory.   PHYSICAL EXAMINATION:  VITAL SIGNS:  (Most recent)  Heart rate is ranging  140's to 160's.  Blood pressure 124/67, O2 saturations 99% on 40% FIO2  BiPap.  GENERAL:  The patient appears to be alert, in no apparent distress.  HEENT:  Deferred while on BiPap.  HEART:  Irregular rhythm, tachycardic.  LUNGS:  Decreased breath sounds at the bases, difficult secondary to body  habitus.  ABDOMEN:  Soft, obese, nontender; positive bowel sounds.  EXTREMITIES:  She had no clubbing or cyanosis; trace of pitting edema.  Her  left lower extremity is in a cast.   LABORATORY STUDIES:  White count 12.3, hemoglobin 11.8, hematocrit 35.2,  MCV 93, platelet count 211 with an 82% shift.  BNP 572, CPK 48, MB 1.7.  Troponin I  0.03.  Sodium 140, potassium 3, chloride 106, bicarb 26, BUN 20,  creatinine 0.9, glucose 297.  ABG  (while on BiPap) 7.32, pC02 52, pO2 83,  bicarb 27.   ASSESSMENT AND PLAN:  1. FLASH PULMONARY EDEMA (requiring oxygen support).  Continue to diurese.      Check a 2-D echocardiogram.  Continue BiPap support.  The goal will be      to try and wean her off.  2. ATRIAL FIBRILLATION WITH RAPID VENTRICULAR RATE.  Given the patient has      a previous history, would more likely to favor etiology.  A different      etiology may be infection, given her elevated white count and shift      versus she recently was taken off of blood pressure medicines      (including Lopressor); which may be the cause of this as well.  Will      try to reinstate.  In the short term, will work on keeping her on a      Cardizem drip as well as titrating this up.  I have also written for a      dose of IV digoxin, in hopes to get her heart rate down since I am      monitoring her blood pressure.  She is not a Coumadin candidate.  In      the short term will put her on Lovenox.  3. HYPERGLYCEMIA.  The patient has no previous history of diabetes.  Will      check hemoglobin A1C and ensure that this is accurate.  Put her on a      sliding scale q.4h. and sensitive.  4. GLAUCOMA.  Continue medications.  5. HYPOKALEMIA.  Will gently replace.  6. DEMENTIA.  Continue Aricept once the patient is able to take p.o.  Will      also follow sets of cardiac enzymes.  7. LEUKOCYTOSIS.  Rule out infectious process and check a urinalysis.      Annita Brod, M.D.  Electronically Signed     SKK/MEDQ  D:  05/22/2006  T:  05/22/2006  Job:  [id number]   cc:   Dr. Lucretia Kern

## 2011-01-30 NOTE — Assessment & Plan Note (Signed)
[name] OFFICE NOTE   [name], [name]                        MRN:          [id number]  DATE:07/06/2006                            DOB:          07/12/1924    This is a very pleasant 77 year old African-American female patient with a  history of cardiomyopathy, probably ischemic cardiomyopathy, and atrial  fibrillation.  Dr. Percival Spanish saw her 2 weeks ago because of an episode of  rapid atrial fibrillation.  The patient has been on amiodarone and low-dose  pindolol, but this was stopped because of bradycardia.  He wanted to get a  48 hour Holter, which was never done.  He also ordered an adenosine Myoview,  which was done on June 17, 2006, and showed anterior apical infarct versus  soft tissue attenuation.  No ischemia, significant LV dysfunction, and  evidence of LV enlargement.  The patient denies any further problems with  rapid heartbeat, dizziness, pre-syncope, shortness of breath, or chest pain.  Overall, she is doing quite well.  Blood pressure is better today at 121/90,  pulse 79, weight 193, which is down 9 pounds, but she just got her boot off  of her left foot from her ankle fracture.   CURRENT MEDICATIONS:  1. Zyrtec 10 mg daily.  2. Aricept 10 mg daily.  3. Lasix 40 mg daily.  4. Lovastatin 20 mg daily.  5. K-Dur 20 mEq daily.  6. Coumadin.  7. Lisinopril 5 mg daily.  8. Platinol eye drops.  9. Travatan eye drops.  10.Amiodarone 200 mg daily.  11.Ambien CR 6.25 mg daily.  12.Iron sulfate 325 daily.   PHYSICAL EXAM:  This is a very pleasant 77 year old African-American female  in no acute distress.  Blood pressure 121/90, pulse 79, weight 193.  NECK:  Without JVD, HJR, bruit, or thyroid enlargement.  LUNGS:  Clear anterior, posterior, and lateral.  HEART:  Irregular at 58 beats per minute.  Normal S1, S2 with 1/6 systolic  murmur at the left sternal border.  ABDOMEN:  Soft without  organomegaly, masses, lesions, or abnormal  tenderness.  EXTREMITIES:  Trace ankle edema bilaterally.  Decreased, but positive distal  pulses.   EKG:  Sinus bradycardia with PACs, LVH, old septal MI.   IMPRESSION:  1. Ischemic cardiomyopathy, ejection fraction 30-40% on echo.  Recent      Cardiolite negative for ischemia.  Ejection fraction 26%.  2. History of paroxysmal atrial fibrillation, currently in sinus      bradycardia.  Will order 48 hour monitor to follow brady-tachy      arrhythmia she has been having.  3. Hypertension, better control than last office visit.  4. Coumadin therapy.  5. Mild dementia.  6. Dyslipidemia.  7. Anemia.   PLAN:  We will order the 48 hour Holter to see what her rhythm is doing at  home on the low-dose amiodarone, and she will see Dr. Percival Spanish back in 1  month.     ______________________________  Ermalinda Barrios, PA-C    ______________________________  Shaune Pascal. Bensimhon, MD   ML/MedQ  DD: 07/06/2006  DT: 07/07/2006  Job #: [id number]

## 2011-01-30 NOTE — Assessment & Plan Note (Signed)
[place] OFFICE NOTE   Kathleen, Whitledge Salinas                        MRN:          [id number]  DATE:06/14/2006                            DOB:          07/12/1924    PRIMARY CARE PHYSICIAN:  Dr. Roosvelt Harps.   REASON FOR PRESENTATION:  Evaluate patient with atrial fibrillation and  rapid ventricular response.   HISTORY OF PRESENT ILLNESS:  Patient presents for followup.  I saw her in  the hospital when she was admitted with some heart failure.  She was in  rapid atrial fibrillation.  This was somewhat difficult to control.  She  eventually required Cardizem and then subsequently oral amiodarone.  She had  some problems with brady arrhythmia intermittent with recurrent short runs  of atrial fibrillation; however, several days of observation, she maintained  a stable sinus bradycardia on amiodarone and low dose pindolol.  She  returned to the [facility] where she had been recovering from a fractured  ankle.  She says she is doing rehab there and doing well.  She has had none  of the shortness of breath that prompted her hospitalization.  She is felt  no rapid heart rate.  She has had no presyncope or syncope.  They have had  to hold her pindolol because of low heart rates frequently.  She has had no  chest discomfort, neck discomfort, arm discomfort, activity induced nausea  and vomiting, excessive diaphoresis.   Of note, the patient did have a reduced ejection fraction with an EF of  approximately 30-40% with severe hypokinesis of the posterolateral wall.   PAST MEDICAL HISTORY:  1. Atrial fibrillation.  2. Malleolar fracture, status post ORIF.  3. Hypertension.  4. Mild dementia.  5. Dyslipidemia.  6. Glaucoma.  7. Anemia.  8. Hysterectomy.   ALLERGIES:  None.   MEDICATIONS:  1. Zyrtec 10 mg a day.  2. Aricept 10 mg a day.  3. Multivitamins.  4. Iron 325 mg a day.  5. Lasix 40 mg a day.  6.  Lovastatin 20 mg a day.  7. K-Dur 20 mEq daily.  8. Amiodarone 400 mg a day.  9. Coumadin.  10.Lisinopril 5 mg daily.  11.Patanol eye drops.  12.Pindolol 2.5 mg b.i.d.  13.Travatan.   REVIEW OF SYSTEMS:  As stated in the HPI, and otherwise negative for other  systems.   PHYSICAL EXAMINATION:  GENERAL:  Patient is in no distress.  VITAL SIGNS:  Blood pressure 176/84, heart rate 47 and regular.  Weight 200  pounds.  Body Mass Index 35.  HEENT:  Eyelids unremarkable.  Pupils are equal, round and reactive to  light.  Fundi not visualized.  Oral mucosa unremarkable.  NECK:  No jugular venous distention at 90 degrees.  Carotid upstroke brisk  and symmetric.  No bruits, no thyromegaly.  LYMPHATICS:  No cervical, axillary, or inguinal adenopathy.  LUNGS:  Clear to auscultation bilaterally.  BACK:  No costovertebral angle tenderness.  CHEST:  Unremarkable.  HEART:  PMI not displaced or sustained.  S1 and S2 within  normal limits.  No  S3, no murmurs.  ABDOMEN:  Obese, positive bowel sounds.  Normal in frequency and pitch.  No  bruits, rebound, guarding.  There are no midline pulsatile masses.  No  organomegaly.  SKIN:  No rashes, no nodules.  EXTREMITIES:  Pulses 2+.  No edema.   EKG:  Sinus bradycardia, rate 47, intraventricular conduction delay, left  axis deviation, inferolateral T wave inversion, as described in the notes  but without old EKG for comparison.   ASSESSMENT/PLAN:  1. Cardiomyopathy:  The patient does have a cardiomyopathy which appears      to be related to an old infarct.  She is going to get Adenosine      Cardiolite to further risk stratify, looking for high grade areas of      ischemia.  Will continue to manage her cardiomyopathy medically.  2. Atrial fibrillation:  I am going to reduce her amiodarone to 200 mg a      day.  She most likely is maintaining sinus rhythm, although I cannot be      absolutely sure of this.  She does not get the pindolol very often,  and      she is bradycardic.  Therefore, I am going to discontinue it      altogether.  She is going to get a 48-hour monitor to follow this.  Of      note, I did speak to Dr. Felipa Eth today.  He agrees to follow her      Coumadin, which was therapeutic on discharge.  I educated the patient      and the family extensively about this again today.  They did receive      the Coumadin literature in the hospital.  Again, I have written an      order for Dr. Felipa Eth to begin to follow this.  3. Hypertension:  Blood pressure is mildly elevated.  It was not in the      hospital.  I will titrate slowly, probably going up on her lisinopril      next.  I will do that when I see her again.  4. Followup:  I am going to see her back in about four weeks or sooner if      needed, based on the results of the Holter and the stress perfusion      study.            ______________________________  Minus Breeding, MD, [facility]     JH/MedQ  DD:  06/14/2006  DT:  06/15/2006  Job #:  [id number]   cc:   Dr. Roosvelt Harps in [facility] T. Stoneking, M.D.

## 2011-01-30 NOTE — Op Note (Signed)
[name], [name]               ACCOUNT NO.:  [id number]   MEDICAL RECORD NO.:  [id number]          PATIENT TYPE:  INP   LOCATION:  5037                         FACILITY:  [facility]   PHYSICIAN:  Lockie Pares, M.D.    DATE OF BIRTH:  07/12/1924   DATE OF PROCEDURE:  04/01/2006  DATE OF DISCHARGE:                                 OPERATIVE REPORT   PREOPERATIVE DIAGNOSIS:  Displaced medial malleolus fracture.   POSTOPERATIVE DIAGNOSIS:  Displaced medial malleolus fracture.   OPERATION:  1. Open reduction/internal fixation of left medial malleolus fracture with      two 4.0 cancellous screws.  2. Repair of anterior capsule and deltoid ligament.   SURGEON:  Lockie Pares, M.D.   ASSISTANT:  Aaron Edelman D. Petrarca, P.A.-C..   TOURNIQUET TIME:  Approximately 40 minutes for this procedure.   A J-shaped skin incision was used on the medial side of the ankle to expose  the anterior capsule and medial malleolus.  Provisional fixation with towel  clips and K wires was placed.  The anterior capsule and anterior aspect of  the deltoid ligament was torn and repaired, as well as placement of two 4.0  cancellous screws that were confirmed to be anatomically reducing the  fracture in the AP and lateral mortis views.  Wound was irrigated.  Closure  was effected with 2-0 Vicryl and skin clips.  Marcaine with epinephrine  within the wound.  Lightly compressive sterile dressing applied.  Tourniquet  was released after application of the splint.      Lockie Pares, M.D.  Electronically Signed     WDC/MEDQ  D:  04/01/2006  T:  04/01/2006  Job:  [id number]

## 2011-01-30 NOTE — Assessment & Plan Note (Signed)
[place] OFFICE NOTE   Serine, Beiser [name]                        MRN:          [id number]  DATE:06/23/2006                            DOB:          07/12/1924    PRIMARY CARE PHYSICIAN:  Dr. Roosvelt Harps.   REASON FOR VISIT:  Evaluate patient with atrial fibrillation.   HISTORY OF PRESENT ILLNESS:  The patient returns for followup.  Since I last  saw her, I ordered an adenosine Cardiolite.  She popped into atrial  fibrillation during that test.  She had a rapid ventricular response that  was controlled before she left the office.  She was started back on Pindolol  which I had discontinued at the appointment a few days before that.  She now  presents and is back in sinus rhythm.  She thinks she can feel the  fibrillation and new that she was back in regular rhythm.  She has been  rehabilitating from a foot fracture.  She has not been having any presyncope  or syncope.  She has not been having any chest pain.  She has not been  having any chest pain.  She has no shortness of breath, PND or orthopnea.   The Cardiolite did demonstrate an EF of probably 30% with probable old  anteroapical infarct with no ischemia.   PAST MEDICAL HISTORY:  1. Atrial fibrillation.  2. Cardiomyopathy (EF approximately 30%).  3. Malleolar fracture, status post ORIF.  4. Hypertension.  5. Mild dementia.  6. Dyslipidemia.  7. Glaucoma.  8. Anemia.  9. Hysterectomy.   ALLERGIES:  No known drug allergies.   MEDICATIONS:  1. Zyrtec 10 mg daily.  2. Aricept 10 mg daily.  3. Multivitamin.  4. Iron 325 mg daily.  5. Lasix 40 mg a day.  6. Lovastatin 20 mg a day.  7. K-Dur 20 mEq daily.  8. Coumadin followed by Dr. Felipa Eth.  9. Lisinopril 5 mg daily.  10.Patanol.  11.Pindolol 2.5 mg b.i.d.  12.Travatan eye drops.  13.Amiodarone 200 mg daily.   REVIEW OF SYSTEMS:  As stated in the HPI and otherwise negative for  other  systems.   PHYSICAL EXAMINATION:  GENERAL:  The patient is in no distress.  VITAL SIGNS:  Blood pressure 200/108, heart rate 54 and regular.  Weight 202  pounds.  HEENT:  Eyes unremarkable.  Pupils equal round and reactive to light.  Fundi  not visualized.  Oral mucosa normal.  NECK:  No jugular venous distention at 90 degrees, carotid upstroke brisk  and symmetric.  No bruits or thyromegaly.  LYMPHS:  No adenopathy.  LUNGS:  Clear to auscultation bilaterally.  BACK:  No costovertebral angle tenderness.  CHEST:  Unremarkable.  HEART:  PMI not displaced or sustained.  S1, S2 within normal limits.  No  S3, S4.  No murmurs.  ABDOMEN:  Flat, positive bowel sounds.  Normal in frequency, pitch.  No  bruits, rebound or midline pulsatile mass or organomegaly.  SKIN:  No rashes.  EXTREMITIES:  2+ pulses, no edema.   EKG with  sinus bradycardia, rate 54, premature ectopic complexes,  intraventricular conduction delay with repolarization changes.  No change  from previous EKGs.   ASSESSMENT/PLAN:  1. Atrial fibrillation.  The patient is now holding sinus rhythm      symptomatically and by EKG today.  She will continue on the amiodarone.      We have discussed extensively all the side effects of this medication      and she understands that we will need close surveillance on this.  She      will continue on the Coumadin.  2. Cardiomyopathy.  This is probably ischemic.  I am going to manage it by      titrating her medications.  I am going to leave her on a little bit of      beta blocker as she is tolerating this now.  I am going to increase her      lisinopril as described below.  3. Hypertension.  Blood pressure is elevated.  I repeated it and it was      actually 170/98.  I am going to increase her lisinopril to 10 mg daily.      She is going to come back in 2 weeks to have titration probably up to      20 mg daily.  She will need a BMET at that time.  I would like      to see her  back in my clinic in about 4-6 weeks.  4. Followup as above.            ______________________________  Minus Breeding, MD, [facility]    JH/MedQ DD:  06/23/2006 DT:  06/25/2006 Job #:  [id number]   cc:   Hal T. Stoneking, M.D.

## 2011-01-30 NOTE — H&P (Signed)
NAMEESI, ROSENBAUM               ACCOUNT NO.:  [id number]   MEDICAL RECORD NO.:  [id number]          PATIENT TYPE:  INP   LOCATION:  5037                         FACILITY:  [facility]   PHYSICIAN:  Jacquelynn Cree, M.D.   DATE OF BIRTH:  07/12/1924   DATE OF ADMISSION:  03/31/2006  DATE OF DISCHARGE:                                HISTORY & PHYSICAL   PRIMARY CARE PHYSICIAN:  Lucretia Kern, M.D.   CHIEF COMPLAINT:  Ankle fracture status post fall.   HISTORY OF PRESENT ILLNESS:  The patient is a 77 year old female who has  been independent of all ADLs and slipped and fell on a wet floor last night  while ambulating to the bathroom.  She presented to [facility] with  left ankle pain and was found to have a fracture.  Evidently, there is no  orthopedic surgeon there and the radiologist did feel that the patient would  need an orthopedic intervention.  We are asked to accept the patient on  transfer secondary to her medical comorbidities.  Dr. French Ana was consulted  by the physician at the outside hospital and agreed to see the patient in  consultation when she arrived here.  There was no syncope or presyncope  prior to the fall.  The patient simply slipped and fell because of  ambulating on a wet surface in the bathroom.   PAST MEDICAL HISTORY:  1.  Atrial fibrillation, not on chronic anticoagulation.  2.  Hypertension.  3.  Dementia.  4.  Hyperlipidemia.  5.  History of hysterectomy.  6.  History of aneurysm status post neurosurgical intervention.  7.  Glaucoma.   ALLERGIES:  No known drug allergies.   CURRENT MEDICATIONS:  Note, the patient does not know her dosages but she  takes Cozaar, Lipitor, Norvasc, Aricept, aspirin, lisinopril, Patenol eye  dropps, and one other eye drop of which she does not know the name.   SOCIAL HISTORY:  The patient is widowed and lives alone.  She is a life long  nonsmoker.  No alcohol.  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  She has three children.  She has been independent of all  ADLs.   FAMILY HISTORY:  The patient's mother died in her 25s from bone cancer.  Father died at age 27 from a stroke.  She had one brother who died at 48  from heart failure.   REVIEW OF SYSTEMS:  The patient denies any fever or chills.  She reports  that her appetite has been good.  She has not had any weight changes.  No  chest pain, shortness of breath, or cough.  No changes in bowel habits,  melena or hematochezia.  No dysuria.  Comprehensive review of systems  otherwise negative.   PHYSICAL EXAMINATION:  VITAL SIGNS:  Temperature 97.4, pulse 80, respirations 16, blood pressure  96/52.  GENERAL:  Well-developed, well-nourished female in no acute distress.  HEENT: Normocephalic, atraumatic.  PERRL.  EOMI with 1-2 beats nystagmus in  the extreme lateral gaze.  Oropharynx is clear.  NECK:  Supple, no  thyromegaly, no lymphadenopathy, no jugular venous  distension.  CHEST:  Lungs clear to auscultation bilaterally with good air movement.  HEART:  Heart sounds are irregularly irregular.  ABDOMEN:  Soft, nontender, nondistended with normoactive bowel sounds.  EXTREMITIES:  The patient has swelling to the left lower extremity to the  knee.  She is able to move her toes and is neurovascularly intact.  SKIN:  Warm and dry.  No rashes.  NEUROLOGIC:  The patient is alert and oriented x3.  Cranial nerves 2-12  grossly intact.  Nonfocal.   DATA REVIEW:  Sodium is 146, potassium 3.2, chloride 105, bicarb 29, BUN 21,  creatinine 0.8, glucose 124.  No other data was sent with the patient.   ASSESSMENT/PLAN:  1.  Ankle fracture on the left:  We will consult Dr. French Ana for      consideration of orthopedic repair.  I am unclear if the films were sent      with the patient, so I will get a set of left ankle films for Dr.      French Ana to review.  We will use pain medications p.r.n. for pain control      in the meantime.  2.   Atrial fibrillation:  The patient is not a Coumadin candidate.  She is      currently rate controlled.  We will obtain a 12-lead EKG for baseline      purposes and a chest x-ray, as well.  3.  Hypertension:  The patient is not currently hypertensive.  We will      continue her routine medications at low doses since she does not know      the dosages of her medications.  The patient's daughter will bring these      in for medication reconciliation.  We will monitor her blood pressure      and adjust her medicines as needed.  4.  Hyperlipidemia:  We will restart a low dose statin until we get      confirmation of her home dose.  5.  Prophylaxis:  We will initiate GI prophylaxis.  Given the possibility of      impending surgery, will not put her on DVT prophylaxis with a blood      thinner.  We will use PAS hose.      Jacquelynn Cree, M.D.  Electronically Signed     CR/MEDQ  D:  03/31/2006  T:  03/31/2006  Job:  [id number]   cc:   Lucretia Kern, M.D.  [place]

## 2011-01-30 NOTE — Assessment & Plan Note (Signed)
[place] OFFICE NOTE   [name], Ince Zaleigh                        MRN:          [id number]  DATE:11/04/2006                            DOB:          Nov 14, 1922    PRIMARY CARE PHYSICIAN:  Dr. Chriss Czar.   REASON FOR PRESENTATION:  Evaluate patient with hypertension and atrial  fibrillation.   HISTORY OF PRESENT ILLNESS:  Patient is an 77 year old African American  female with atrial fibrillation. At the last visit, she had not had any  palpitations or evidence of recurrence of this. She is maintaining her  Coumadin. At the last visit I did add Norvasc because her blood pressure  was elevated. She returns for followup today of her atrial fibrillation.  She, again, has had no paroxysms. She thinks she is feeling pretty well.  She denies any presyncope or syncope. She has had no chest discomfort or  shortness of breath. She does get her blood pressure checked by her home  health nurse. She reports that the systolic is typically above 140s. It  is elevated today as below. She is taking her medications. She is trying  watch her diet with salt.   PAST MEDICAL HISTORY:  Atrial fibrillation, malleolar fracture status  post open reduction and internal fixation, hypertension, mild dementia,  dyslipidemia, glaucoma, anemia, hysterectomy, cardiomyopathy (presumed  ischemic; EF is approximately 35%), bradycardia.   MEDICATIONS:  1. Multivitamin.  2. Coumadin.  3. Furosemide 40 mg daily.  4. Lovastatin 20 mg daily.  5. Klor-Con 20 mEq daily.  6. Aricept 10 mg daily.  7. Amiodarone 200 mg daily.  8. Travatan.  9. Patanol.  10.Lisinopril 40 mg daily.  11.Iron.  12.Amlodipine 2.5 mg daily.   REVIEW OF SYSTEMS:  As stated in the HPI and otherwise negative for  other systems.   PHYSICAL EXAMINATION:  The patient is in no distress. Blood pressure  180/86, heart rate 62 and regular, weight 185 pounds, body  mass index  32.  HEENT: Eyelids unremarkable, pupils equal, round, and reactive to light,  fundi not visualized, oral mucosa unremarkable.  NECK: No jugular venous distension at 45 degrees, carotid upstrokes  brisk and symmetric, no bruits, or thyromegaly.  LYMPHATICS: No cervical, axillary, inguinal adenopathy.  LUNGS: Clear to auscultation bilaterally.  BACK: No costovertebral angle tenderness.  CHEST: Unremarkable.  HEART: PMI not displaced or sustained, S1 and S2 within normal limits,  no S3, no S4, no clicks, no rubs, no murmurs.  ABDOMEN: Obese, positive bowel sounds normal in frequency and pitch, no  bruits, no rebound, no guarding, no midline pulsatile mass, no  hepatomegaly, no splenomegaly.  SKIN: No rashes, no nodules.  EXTREMITIES: 2+ pulses, no cyanosis, no clubbing, no edema.  NEURO: Grossly intact.   EKG: Sinus rhythm, left bundle branch block, left axis deviation,  premature atrial contractions.   ASSESSMENT/PLAN:  1. Atrial fibrillation.  The patient is having no further dysrhythmia.      She will continue on the amiodarone and the Coumadin. I am going to      need to seen  her back routinely to make sure she has liver,      thyroid, pulmonary, optic, and neuromuscular evaluation on the      amiodarone.  2. Hypertension.  I have again taken the liberty of increasing her      Norvasc to 10 mg q. day. She has a home health nurse and they can      keep a diary of her blood pressures and share this with her primary      care doctor.  I would be happy to review these as well.  3. Follow up.  I will see her back in about 6 months or sooner if      needed.     Minus Breeding, MD, [facility]  Electronically Signed    JH/MedQ  DD: 11/04/2006  DT: 11/04/2006  Job #: [id number]   cc:   Chriss Czar, MD

## 2011-01-30 NOTE — Discharge Summary (Signed)
[name], [name]               ACCOUNT NO.:  [id number]   MEDICAL RECORD NO.:  [id number]          PATIENT TYPE:  INP   LOCATION:  [id number]                         FACILITY:  [facility]   PHYSICIAN:  Jacquelynn Cree, M.D.   DATE OF BIRTH:  07/12/1924   DATE OF ADMISSION:  2006/04/29  DATE OF DISCHARGE:  04/05/2006                                 DISCHARGE SUMMARY   DISCHARGE DIAGNOSES:  1.  Displaced medial malleolus fracture status post open reduction internal      fixation with two 4.0 cancellous screws.  2.  Chronic atrial fibrillation, not a Coumadin candidate.  3.  Hypertension.  4.  Dementia.  5.  Hyperlipidemia.  6.  Glaucoma.  7.  Acute blood loss anemia.  8.  Hypokalemia.   DISCHARGE MEDICATIONS:  1.  Aricept 10 mg q.h.s.  2.  Patanol 0.1% one drop in each eye b.i.d.  3.  Travatan 0.004% one drop OU q.h.s.  4.  Norvasc 10 mg daily.  5.  Lovastatin 20 mg daily.  6.  Lisinopril 20 mg daily.  7.  Colace 100 mg b.i.d.  8.  Cozaar 50 mg daily.  9.  Potassium chloride 20 mEq daily.  10. Vicodin 5/500 one tablet q.4 h p.r.n.  11. Tylenol 650 mg q.4 h p.r.n.  12. Phenergan 12.5 mg p.o. q.6 h p.r.n.   CONSULTANTS:  Dr. French Ana of orthopedic surgery.   BRIEF ADMISSION HPI:  The patient is a 77 year old female who slipped and  fell on a wet floor causing her to injure her ankle.  She was evaluated in  the emergency department at an outside facility and found to have a  fracture.  Because the referring facility did not have an orthopedic surgeon  on staff, she was transferred here for further orthopedic evaluation and  work up.  Medicine was asked to admit the patient due to her significant  medical comorbidities.   PROCEDURES AND DIAGNOSTIC STUDIES:  1.  Left ankle film on Apr 29, 2006 showed a medial malleolar fracture.  2.  Chest x-ray on Apr 29, 2006 showed cardiomegaly without failure, a      tortuous aorta was also seen.   DISCHARGE LABORATORY VALUES:  Sodium 141,  potassium 3.7, chloride 29, bicarb  29, BUN 10, creatinine 0.8, glucose 119.  White blood cell count was 10.9,  hemoglobin 10.5, hematocrit 31.6, platelet count 202.   HOSPITAL COURSE:  1.  Ankle fracture:  The patient was admitted and orthopedic surgery was      consulted regarding repair.  The patient underwent an open reduction      internal fixation on the morning of April 01, 2006.  She did well      postoperatively and has progressed with physical therapy and is stable      for discharge.  She should follow up with Dr. French Ana in 8 to 10 days at      his office.  2.  Paroxysmal atrial fibrillation:  The patient is not a Coumadin candidate      given her falls.  She is rate controlled and  has remained so through the      course of her hospitalization.  3.  Hypertension:  The patient's blood pressure was well controlled on the      regimen as outlined above.  4.  Anemia:  The patient did develop some postoperative anemia felt to be      secondary to operative blood loss.  She has been stable through the      course of her hospitalization.  5.  Hypokalemia.  The patient was appropriately repleted and did not have      any further problems with hypokalemia.   DISPOSITION:  The patient is stable for discharge to a skilled nursing  facility where she should continue with physical therapy.  Her daughter is  currently discussing the placement options with her and as soon as they  decide on a facility, the patient will be discharged.      Jacquelynn Cree, M.D.  Electronically Signed     CR/MEDQ  D:  04/05/2006  T:  04/05/2006  Job:  [id number]

## 2011-01-30 NOTE — Discharge Summary (Signed)
[name], [name]               ACCOUNT NO.:  [id number]   MEDICAL RECORD NO.:  [id number]          PATIENT TYPE:  INP   LOCATION:  [id number]                         FACILITY:  [place]   PHYSICIAN:  Minus Breeding, MD, FACCDATE OF BIRTH:  07/12/1924   DATE OF ADMISSION:  05/22/2006  DATE OF DISCHARGE:  06/02/2006                                 DISCHARGE SUMMARY   PRIMARY CARE PHYSICIAN:  Dr. Lucretia Kern.   PRIMARY CARDIOLOGIST:  Minus Breeding, MD, [facility].   PRINCIPAL DIAGNOSIS:  Atrial fibrillation with rapid ventricular response.   SECONDARY DIAGNOSES:  1. Congestive heart failure with cardiomyopathy and ejection fraction of      30-40% with mild diffuse left ventricular hypokinesis and severe      posterolateral wall hypokinesis.  2. Recent displaced malleolar fracture of the left malleolus, status post      open reduction internal fixation.  3. Hypertension.  4. Dementia.  5. Hyperlipidemia.  6. Glaucoma.  7. Anemia following open reduction internal fixation.   ALLERGIES:  NO KNOWN DRUG ALLERGIES.   PROCEDURE:  A 2D echocardiogram.   HISTORY OF PRESENT ILLNESS:  A 77 year old African American female with the  above problem list who is recently status post medial left malleolar  fracture with ORIF done just over a month ago, who has been in rehab at the  [facility].  On the day of admission she developed sudden dyspnea and was  taken to the [facility] ED, where she was found to be in rapid  atrial fibrillation with flash pulmonary edema.  She was treated with IV  Cardizem infusion as well as IV Lasix with brisk diuresis and only modest  rate control.  She was subsequently admitted for further evaluation.   HOSPITAL COURSE:  We were consulted to see the patient and then assumed her  care.  Initially, we attempted rate control with IV diltiazem as well as  digoxin, however, this was inadequate at controlling her heart rate and  thus, she was initiated on IV  amiodarone on May 25, 2006.  Shortly  after IV amiodarone bolus, she converted from AFib to a sinus rhythm at  approximately 4:30 p.m. on May 25, 2006, but then developed  asymptomatic bradycardia with heart rates trending in the 50s but dipping  into the 40s.  At that point, her diltiazem and digoxin were discontinued,  and her amiodarone was switched to an oral dose at 400 mg daily.  The  decision was also made at that point to initiate Coumadin anticoagulation  after a long discussion with the patient and her family who felt that she  was not a significant fall risk.  She maintained a sinus rhythm.  She was  noted on May 28, 2006, to have six hours of paroxysmal atrial  fibrillation and thus her amiodarone dose was increased to t.i.d. and she  was also placed on low dose oral diltiazem.  She converted back to sinus  rhythm on the evening of May 29, 2006, and was noted at that point to  have recurrent bradycardia and thus her Cardizem again  was discontinued and  her amiodarone was decreased to 400 mg daily.  Unfortunately, on this  regimen she continued to have intermittent paroxysmal atrial fibrillation  and we decided at that point to initiate Pindolol therapy at 2.5 mg q.12 h.  provided that her heart rate was greater than 55.  While in sinus, she has  remained bradycardic with heart rates sometimes dipping into the 40s thus  limiting her Pindolol usage.  She is felt to be ready for discharge back to  [facility] today on amiodarone, Coumadin, as well as Pindolol with  Pindolol only to be given for heart rates greater than 55 beats per minute.  We have arranged for her to follow up in our office, [facility], on  July 09, 2006 at 11:30 a.m. at which time she will also undergo a  complete metabolic panel testing to re-evaluate elevated liver enzymes as  well as be set up with a 48-hour Holter monitor to reassess her heart rates  while on amiodarone.  She  will also follow up with Dr. Percival Spanish on June 14, 2006.   Of note, during her hospitalization she underwent a 2D echocardiogram which  revealed an EF of 30-40% with diffuse LV hypokinesis, and severe hypokinesis  of the posterolateral wall.  The left atrium was noted to be mildly to  moderately dilated.  Given her depressed LV function, she will require  outpatient ischemic evaluation and timing of this will be determined by Dr.  Percival Spanish in followup.   DISCHARGE LABORATORY:  Hemoglobin 10.8, hematocrit 31.7, WBC 6.7, platelets  151.  PT 29.4, INR 2.6.  Sodium 145, potassium 3.7, chloride 109, CO2 29,  BUN 18, creatinine 1.1, glucose 115, calcium 9.6.  CK 39, MB 2.0, troponin I  0.03.  Total cholesterol 114, triglycerides 53, HDL 55, LDL 48. TSH 2.157.  Total bilirubin 1.4, alkaline phosphatase 52, AST 61, ALT 63.  Magnesium  1.8.  Total protein 6.4.  Albumin 3.5.  TSH 2.157.   DISPOSITION:  The patient is being discharged home today in good condition.   FOLLOWUP PLAN/APPOINTMENT:  1. She is to follow up at [facility] on July 09, 2006 at 11:30 a.m.  She will also have a complete metabolic panel      at that time as well as be connected to a 48-hour Holter monitor to re-      evaluation the burden of her atrial fibrillation and bradycardia.  2. She will then follow up with Dr. Percival Spanish on June 14, 2006 at 9:45      a.m.   DISCHARGE MEDICATIONS:  1. Coumadin 2 mg q.h.s.  2. Travatan 0.004%, one drop both eyes q.h.s.  3. Patanol 0.01%, one drop both eyes b.i.d.  4. K-Dur 20 mEq every day.  5. Lovastatin 20 mg every day.  6. Lisinopril 5 mg every day.  7. Amiodarone 40 mg every day.  8. Lasix 20 mg every day.  9. Iron 325 mg every day.  10.Multivitamin one every day.  11.Aricept 10 mg every day.  12.Pindolol 2.5 mg b.i.d., hold for heart rate less than 55 beats per      minute.  13.Zyrtec 10 mg daily.  The patient has been counseled on the  importance of a low sodium/no added  salt diet and the importance of daily weights and has been advised to report  weight gain greater than 2 pounds in 1 day or 5 pounds in 1  week.   OUTSTANDING LABORATORY STUDIES:  None.   Duration of discharge encounter:  Sixty minutes including physician time.     ______________________________  Murray Hodgkins, ANP    ______________________________  Minus Breeding, MD, [facility]    CB/MEDQ  D:  06/02/2006  T:  06/02/2006  Job:  [id number]   cc:   Lucretia Kern, Dr.

## 2011-01-30 NOTE — Assessment & Plan Note (Signed)
[place] OFFICE NOTE   [name]                        MRN:          [id number]  DATE:08/04/2006                            DOB:          07/12/1924    PRIMARY CARE PHYSICIAN:  Dr. Roosvelt Harps   REASON FOR PRESENTATION:  Evaluate patient with atrial fibrillation.   HISTORY OF PRESENT ILLNESS:  The patient returns for followup.  She is now  out of the [facility]  She has not had her Coumadin checked since she  left the [facility].  She says she has been doing well.  She is living at  home and her daughter is staying with her.  She has not noticed any  palpitations, presyncope or syncope.  She has had no new shortness of breath  and denies any PND or orthopnea.  She has had no chest discomfort, neck  discomfort, arm discomfort, activity-induced nausea or vomiting, or  excessive diaphoresis.   She has had some swelling in her left knee, but not in her calf.  She has  had some high blood pressures and this has interfered a little bit with her  physical therapy.  Of note, she saw our P.A. on October 23 and did have lab  work done, which looked fine.  She had a Holter monitor, which demonstrates  she still has some brief paroxysms of atrial fibrillation.  She does have  some bradyarrhythmias and currently is not on any beta blocker.  However,  these have not been symptomatic and not prolonged.  Again, she is not  noticing any palpitations.   PAST MEDICAL HISTORY:  Atrial fibrillation, malleolar fracture, status post  ORIF; hypertension, mild dementia, dyslipidemia, glaucoma, anemia,  hysterectomy, cardiomyopathy (presumed ischemic).   ALLERGIES:  None.   MEDICATIONS:  1. Multivitamin.  2. Coumadin.  3. Furosemide 40 mg a day.  4. Lovastatin 20 mg a day.  5. Ambien 6.25 mg q.h.s.  6. Klor-Con 20 mEq daily.  7. Zyrtec.  8. Aricept 10 mg daily.  9. Lisinopril 20 mg daily.  10.Amiodarone 200 mg daily.  11.__________  .  12.Iron.  13.Patanol.   REVIEW OF SYSTEMS:  As stated in the HPI, and otherwise negative for other  systems.   PHYSICAL EXAMINATION:  The patient is in no distress.  Blood pressure  152/84, heart rate 76 and regular, weight 187 pounds.  HEENT:  Eyes are unremarkable.  Pupils were equal, round and reactive to  light.  Fundi not visualized.  Oral mucosa unremarkable.  NECK:  No jugular venous distention at 90 degrees, carotid upstroke brisk  and symmetric, no bruits, no thyromegaly.  LYMPHATICS:  No cervical or axillary adenopathy.  The inguinals are not  appreciated.  BACK:  No costovertebral angle tenderness.  LUNGS:  Clear to auscultation and percussion bilaterally.  CHEST:  Unremarkable.  HEART:  S1 and S2 were within normal limits, no S3, no S4, no murmurs.  ABDOMEN:  Mildly obese, positive bowel sounds, normal in frequency and  pitch.  No bruits, rebound, no guarding.  I cannot appreciate hepatomegaly  or splenomegaly and do not appreciate a midline pulsatile mass with the  patient seated.  SKIN:  No rashes, no nodules.  EXTREMITIES:  2+ pulses, trace bilateral lower extremity edema.  She has  some mild swelling in her left knee, but no swelling or pulsatile mass in  her popliteal fossa and no calf tenderness or swelling.   ASSESSMENT AND PLAN:  1. Atrial fibrillation:  The patient still has some paroxysms of atrial      fibrillation and will remain on the Coumadin.  However, these are not      symptomatic or sustained, as they were before.  Therefore, I think she      needs the amiodarone.  We will survey her as indicated for amiodarone      toxicity.  She will continue on the medications as listed.  2. Hypertension:  I will increase her lisinopril to 40 mg daily and she      will get a BMET in two weeks.  3. Cardiomyopathy as above:  She will not tolerate beta blocker because of      some bradyarrhythmias.  Therefore, we will  just continue with the ACE.      I will consider hydralazine and nitrates in the future.  4. Coumadin:  We will get it checked today.  5. Followup:  I did give her a flu shot today.  I will see her again in      about three months, but we will get the BMET in two weeks.     Minus Breeding, MD, [facility]  Electronically Signed    JH/MedQ  DD: 08/04/2006  DT: 08/04/2006  Job #: [id number]   cc:   Dr. Roosvelt Harps,  [facility]

## 2011-01-30 NOTE — Letter (Signed)
April 08, 2009    [facility]  [employer] Mail[employer]: [id number]  [place] [address]   RE:  [name], [name]  MRN:  [id number]  /  DOB:  December 16, 1922   Dear Sir/Madam:   This letter regards [name], date of birth 1923/09/09.  She  is your customer.  She is a patient of mine with significant  cardiovascular problems.  I see her frequently for these.  As such, her  service should not be interrupted and she should be considered to  require an essential service line.  If you have any questions regarding  this, please do not hesitate to call me at [phone].  Thank you for  your attention to this matter.    Sincerely,      Minus Breeding, MD, [facility]  Electronically Signed    JH/MedQ  DD: 04/08/2009  DT: 04/09/2009  Job #: [id number]

## 2011-01-30 NOTE — Assessment & Plan Note (Signed)
[place] OFFICE NOTE   [name], Crahan Catelin                        MRN:          [id number]  DATE:08/26/2006                            DOB:          07/12/1924    PRIMARY CARE PHYSICIAN:  Chriss Czar, MD in [place]:  Evaluate patient with atrial fibrillation.   HISTORY OF PRESENT ILLNESS:  The patient returns for followup. At the  last visit, I increased her lisinopril to 40 mg a day and she did have a  followup BMET 2 weeks later. She says she has been doing well. She is  getting more and more energy. She has not felt any palpitations. She has  had no shortness of breath. Denies any PND or orthopnea. She has had no  pre-syncope or syncope. She has finished physical therapy on her ankle  injury.   PAST MEDICAL HISTORY:  1. Atrial fibrillation.  2. Malleolar fracture, status post open reduction internal fixation.  3. Hypertension.  4. Mild dementia.  5. Dyslipidemia.  6. Glaucoma.  7. Anemia.  8. Hysterectomy.  9. Cardiomyopathy (presumed ischemic).   ALLERGIES:  None.   CURRENT MEDICATIONS:  1. Multivitamin.  2. Coumadin.  3. Furosemide 40 mg daily.  4. Lovastatin 20 mg daily.  5. Ambien 6.25 mg nightly.  6. Klor-Con 20 mEq daily.  7. Aricept 10 mg daily.  8. Amiodarone 200 mg daily.  9. Travatan.  10.Patanol.  11.Lisinopril 40 mg daily.  12.Iron.   REVIEW OF SYSTEMS:  As stated in the HPI and otherwise negative for  other systems.   PHYSICAL EXAMINATION:  The patient is in no distress. Blood pressure is  181/90, heart rate is 63 and regular, weight is 186 pounds. Body mass  index is 32.  HEENT: Eyes unremarkable. Pupils equal, round, and reactive to light.  Fundi not visualized.  NECK: No jugular venous distention at 90-degrees. Carotid upstroke brisk  and symmetric.  LUNGS: Clear to auscultation bilaterally.  HEART: S1, S2 within normal limits. No S3.  No S4. No murmurs.  ABDOMEN: Positive bowel sounds without rebound or guarding. No obvious  organomegaly.  EXTREMITIES: 2+ pulses. No edema.   ASSESSMENT/PLAN:  1. Atrial fibrillation. The patient is maintaining sinus rhythm both      symptomatically and by physical and EKG's. At this point, no      further cardiovascular testing is suggested. She will continue on      the medication as listed.  2. Hypertension. I will add amlodipine 2.5 mg daily. She has her home      health nurse who can follow her blood pressures.   FOLLOWUP:  I will see her back in 3 months or sooner if needed. She will  continue to be followed in our Coumadin Clinic.     Minus Breeding, MD, [facility]  Electronically Signed    JH/MedQ  DD: 08/26/2006  DT: 08/26/2006  Job #: [id number]   cc:   Chriss Czar, MD

## 2011-01-30 NOTE — Consult Note (Signed)
[name], CRUMRINE               ACCOUNT NO.:  [id number]   MEDICAL RECORD NO.:  [id number]          PATIENT TYPE:  INP   LOCATION:  2113                         FACILITY:  [place]   PHYSICIAN:  Deboraha Sprang, MD, FACCDATE OF BIRTH:  07/12/1924   DATE OF CONSULTATION:  DATE OF DISCHARGE:                                   CONSULTATION   Thank you very much for asking [place] to see [name] in consultation  because of atrial fibrillation that is really quite rapid and with signs of  congestive heart failure.   [name] is an 77 year old woman with a history of atrial fibrillation  noted in July 2007, at which time she presented to [facility] in transfer  because of a fractured ankle.  No further evaluation, at that time, was  undertaken by cardiology.   She underwent ORIF and was subsequently transferred to the [facility] for  rehabilitation, which had been going quite well with anticipated discharge  this coming Tuesday.   However, 3 days ago she became abruptly more short of breath, which has  become progressive.  Because of that, she was transferred to [facility]  in the middle of the night.  She was found to be in extremis.  She was  placed on BiPAP, diuresed, and Cardizem with improvement in her clinical  situation; her atrial fibrillation rate, which had been about 160, gradually  decreased into the 120 range with glimpses of 60s.  She was, as noted,  feeling much better.   She denies chest pain or pleurisy.  She has been taking Lovenox on a daily  basis.  Presumably DVT prophylaxis doses.  She denies salt or water  indiscretion.   There are recently reported function medication changes with the  discontinuation of a lot of her antihypertensives and her beta-blocker.  I  wonder what prompted this.   PAST MEDICAL HISTORY:  1. Hypertension.  2. Dementia.  3. Dyslipidemia.  4. Glaucoma.  5. Anemia.  6. Diabetes.   PAST SURGICAL HISTORY:  1. ORIF,  as noted, in July.  2. Hysterectomy.  3. Injuries with a neurosurgical procedure.   SOCIAL HISTORY:  She is currently at the [facility] with pending plans to  discharge to the care of her daughter, who lives here in [place].  She  does not use cigarettes or alcohol.   REVIEW OF SYSTEMS:  Noted on the intake sheet and is not further recounted  here.   PHYSICAL EXAMINATION:  GENERAL:  She is an elderly non-Caucasian, possibly  Native American.  VITAL SIGNS:  Blood pressure 136/65, she has a respiratory rate of 30 on  BiPAP, her pulses range in the 100 to 130 range.  HEENT:  Demonstrated no icterus or xanthoma.  Neck veins with the angle of  the jaw at 30 degrees, so about 9 to 10 cm or greater.  The carotids are  brisk and full bilaterally without bruits.  BACK:  Without kyphosis or scoliosis.  LUNGS:  Surprisingly clear with basilar crackles.  HEART:  Sounds were irregular and rapid.  ABDOMEN:  Soft with active bowel sounds, without midline pulsation.  EXTREMITIES:  Femoral pulses are 2+, distal pulses were trace.  There is 1+  peripheral edema.  NEUROLOGIC:  Grossly normal.   Electrocardiogram, dated this morning at 8:47, demonstrated atrial  fibrillation at a rate of 170.  The intervals were - 0.12/0.26.  The axis  was mildly leftward at -45.  There is poor R-wave progression.   LABORATORY DATA:  Notable for a potassium of 3.  Hemoglobin of 11.9 and a  white count mildly elevated at 12.3.  She also had blood sugar of 297.  Initial blood gas demonstrated a PCO2 of 62.  BNP was 571.  Her troponins  were negative.   IMPRESSION:  1. Congestive heart failure.  Question diastolic versus systolic.  2. Atrial fibrillation with a rapid ventricular response aggravated #1,      question causing #1.  3. Anemia.  4. Recent deterioration in functional status.  Question cause without      clear evidence of pneumonia, pulmonary embolism.  5. Status post open reduction, internal  fixation with nearly completed      rehabilitation.   [name] has atrial fibrillation with a rapid ventricular response.  This is clearly aggravating the situation and may be the trigger for this  situation.  However, that begs the question as to what prompted the rapid  escalation of her atrial fibrillation rates.  There have been immediate  medication adjustments undertaken at [facility] for reasons that are  unavailable to me, including the discontinuation of her beta-blocker and  multiple discontinuation of her antihypertensive medications.  It is  possible that a crisis of hypertension aggravated the current situation.   Ischemia may also be contributing, although I would have expected abnormal  enzymes in this situation.  We also do not know whether her LV function is  normal or not, and this will be critical in trying to understand potential  contributing mechanisms.   PLAN:  1. Augment rate control with low-dose beta-blockade.  2. Continue diuresis.  3. Continue BiPAP.  4. Serial enzymes.  5. Agree with Lanoxin.  6. Check a D-Dimer and undertake a CT scan if it is suggestive of      pulmonary embolism.           ______________________________  Deboraha Sprang, MD, [facility]     SCK/MEDQ  D:  05/22/2006  T:  05/23/2006  Job:  [id number]   cc:   Yves Dill, M.D.  [place]

## 2011-02-24 ENCOUNTER — Ambulatory Visit (INDEPENDENT_AMBULATORY_CARE_PROVIDER_SITE_OTHER): Payer: No Typology Code available for payment source | Admitting: *Deleted

## 2011-02-24 DIAGNOSIS — I4891 Unspecified atrial fibrillation: Secondary | ICD-10-CM

## 2011-02-24 LAB — POCT INR: INR: 1.8

## 2011-03-09 ENCOUNTER — Other Ambulatory Visit: Payer: Self-pay

## 2011-03-09 MED ORDER — WARFARIN SODIUM 3 MG PO TABS: ORAL_TABLET | ORAL | Status: DC

## 2011-03-10 ENCOUNTER — Encounter: Payer: Self-pay | Admitting: Cardiology

## 2011-03-10 ENCOUNTER — Ambulatory Visit (INDEPENDENT_AMBULATORY_CARE_PROVIDER_SITE_OTHER): Payer: No Typology Code available for payment source | Admitting: Cardiology

## 2011-03-10 ENCOUNTER — Ambulatory Visit (INDEPENDENT_AMBULATORY_CARE_PROVIDER_SITE_OTHER): Payer: No Typology Code available for payment source | Admitting: *Deleted

## 2011-03-10 DIAGNOSIS — E785 Hyperlipidemia, unspecified: Secondary | ICD-10-CM

## 2011-03-10 DIAGNOSIS — I4891 Unspecified atrial fibrillation: Secondary | ICD-10-CM

## 2011-03-10 DIAGNOSIS — I10 Essential (primary) hypertension: Secondary | ICD-10-CM

## 2011-03-10 DIAGNOSIS — I509 Heart failure, unspecified: Secondary | ICD-10-CM

## 2011-03-10 DIAGNOSIS — I498 Other specified cardiac arrhythmias: Secondary | ICD-10-CM

## 2011-03-10 LAB — POCT INR: INR: 1.8

## 2011-03-10 NOTE — Assessment & Plan Note (Signed)
Her BP is elevated in the office but at home they report that it is less than 140/90.  She will continue as listed on the medications.

## 2011-03-10 NOTE — Patient Instructions (Signed)
Please continue your current medications Follow up with Dr Percival Spanish in 2 months

## 2011-03-10 NOTE — Assessment & Plan Note (Signed)
She has had no recurrent episodes of this.

## 2011-03-10 NOTE — Progress Notes (Signed)
HPI The patient presents for followup.   Since I last saw her she has had no new complaints.  The patient denies any new symptoms such as chest discomfort, neck or arm discomfort. There has been no new shortness of breath, PND or orthopnea. There have been no reported palpitations, presyncope or syncope.  She watches her salt and fluid.  She has had no swelling and stable weights.  No Known Allergies  Current Outpatient Prescriptions  Medication Sig Dispense Refill  . aliskiren (TEKTURNA) 150 MG tablet Take 150 mg by mouth daily.        Marland Kitchen amLODipine (NORVASC) 10 MG tablet TAKE 1 TABLET DAILY  30 tablet  10  . Cholecalciferol (VITAMIN D) 1000 UNITS capsule Take 1,000 Units by mouth daily.        Marland Kitchen donepezil (ARICEPT ODT) 10 MG disintegrating tablet Take 10 mg by mouth at bedtime.        . furosemide (LASIX) 40 MG tablet Take 40 mg by mouth as needed.        . hydrALAZINE (APRESOLINE) 10 MG tablet Take by mouth. Take 2 tablets 3 times a day       . lovastatin (MEVACOR) 20 MG tablet Take 20 mg by mouth at bedtime.        . Multiple Vitamin (MULTIVITAMIN) tablet Take 1 tablet by mouth daily.        Marland Kitchen warfarin (COUMADIN) 3 MG tablet Take as directed by Anticoagulation clinic   50 tablet  3    Past Medical History  Diagnosis Date  . Paroxysmal atrial fibrillation   . Malleolar fracture   . Hypertension   . Dementia     mild  . Dyslipidemia   . Glaucoma   . Anemia   . Hx of hysterectomy   . Cardiomyopathy     presumed ischemic in the past with an EF of 35%. The most recent EF in 03-2008, however, was 55 -60%  . Bradycardia   . Chronic renal insufficiency     Past Surgical History  Procedure Date  . Hemorrhoid surgery     ROS: As stated in the HPI and negative for all other systems.  PHYSICAL EXAM BP 182/88  Pulse 52  Resp 16  Ht 5\' 6"  (1.676 m)  Wt 175 lb (79.379 kg)  BMI 28.25 kg/m2  PHYSICAL EXAM GEN:  No distress NECK:  No jugular venous distention at 90 degrees,  waveform within normal limits, carotid upstroke brisk and symmetric, no bruits, no thyromegaly LYMPHATICS:  No cervical adenopathy LUNGS:  Clear to auscultation bilaterally BACK:  No CVA tenderness CHEST:  Unremarkable HEART:  S1 and S2 within normal limits, no S3, no S4, no clicks, no rubs, no murmurs ABD:  Positive bowel sounds normal in frequency in pitch, no bruits, no rebound, no guarding, unable to assess midline mass or bruit with the patient seated. EXT:  2 plus pulses throughout, moderate edema, no cyanosis no clubbing SKIN:  No rashes no nodules NEURO:  Cranial nerves II through XII grossly intact, motor grossly intact throughout PSYCH:  Cognitively intact, oriented to person place and time  EKG:  Sinus rhythm, rate 52, left axis deviation, premature ectopic complexes  ASSESSMENT AND PLAN

## 2011-03-10 NOTE — Assessment & Plan Note (Signed)
[name] seems to be euvolemic.  At this point, no change in therapy is indicated.  We have reviewed salt and fluid restrictions.  No further cardiovascular testing is indicated.

## 2011-03-24 ENCOUNTER — Ambulatory Visit (INDEPENDENT_AMBULATORY_CARE_PROVIDER_SITE_OTHER): Payer: No Typology Code available for payment source | Admitting: *Deleted

## 2011-03-24 DIAGNOSIS — I4891 Unspecified atrial fibrillation: Secondary | ICD-10-CM

## 2011-03-24 LAB — POCT INR: INR: 2

## 2011-04-14 ENCOUNTER — Ambulatory Visit (INDEPENDENT_AMBULATORY_CARE_PROVIDER_SITE_OTHER): Payer: No Typology Code available for payment source | Admitting: *Deleted

## 2011-04-14 DIAGNOSIS — I4891 Unspecified atrial fibrillation: Secondary | ICD-10-CM

## 2011-04-14 LAB — POCT INR: INR: 2.1

## 2011-05-12 ENCOUNTER — Ambulatory Visit (INDEPENDENT_AMBULATORY_CARE_PROVIDER_SITE_OTHER): Payer: No Typology Code available for payment source | Admitting: *Deleted

## 2011-05-12 ENCOUNTER — Ambulatory Visit (INDEPENDENT_AMBULATORY_CARE_PROVIDER_SITE_OTHER): Payer: No Typology Code available for payment source | Admitting: Cardiology

## 2011-05-12 ENCOUNTER — Encounter: Payer: Self-pay | Admitting: Cardiology

## 2011-05-12 DIAGNOSIS — I10 Essential (primary) hypertension: Secondary | ICD-10-CM

## 2011-05-12 DIAGNOSIS — I509 Heart failure, unspecified: Secondary | ICD-10-CM

## 2011-05-12 DIAGNOSIS — I4891 Unspecified atrial fibrillation: Secondary | ICD-10-CM

## 2011-05-12 DIAGNOSIS — N259 Disorder resulting from impaired renal tubular function, unspecified: Secondary | ICD-10-CM

## 2011-05-12 LAB — POCT INR: INR: 2.4

## 2011-05-12 LAB — BASIC METABOLIC PANEL
BUN: 29 mg/dL — ABNORMAL HIGH (ref 6–23)
CO2: 33 mEq/L — ABNORMAL HIGH (ref 19–32)
Calcium: 9.5 mg/dL (ref 8.4–10.5)
Chloride: 105 mEq/L (ref 96–112)
Creatinine, Ser: 0.9 mg/dL (ref 0.4–1.2)
GFR: 75.41 mL/min (ref >60.00)
Glucose, Bld: 86 mg/dL (ref 70–99)
Potassium: 3.7 mEq/L (ref 3.5–5.1)
Sodium: 145 mEq/L (ref 135–145)

## 2011-05-12 NOTE — Patient Instructions (Signed)
Please have blood work drawn today.  The current medical regimen is effective;  continue present plan and medications.  Follow up with Dr Percival Spanish in 2 months.

## 2011-05-12 NOTE — Progress Notes (Signed)
HPI The patient presents for followup.   Since I last saw her she has had no new complaints.  The patient denies any new symptoms such as chest discomfort, neck or arm discomfort. There has been no new shortness of breath, PND or orthopnea. There have been no reported palpitations, presyncope or syncope.  She watches her salt and fluid.  She has had no swelling and stable weights.  She walks around her apartment complex for exercise.  No Known Allergies  Current Outpatient Prescriptions  Medication Sig Dispense Refill  . aliskiren (TEKTURNA) 150 MG tablet Take 150 mg by mouth daily.        Marland Kitchen amLODipine (NORVASC) 10 MG tablet TAKE 1 TABLET DAILY  30 tablet  10  . Cholecalciferol (VITAMIN D) 1000 UNITS capsule Take 1,000 Units by mouth daily.        Marland Kitchen donepezil (ARICEPT ODT) 10 MG disintegrating tablet Take 10 mg by mouth at bedtime.        . furosemide (LASIX) 40 MG tablet Take 40 mg by mouth as needed.        . hydrALAZINE (APRESOLINE) 10 MG tablet Take by mouth. Take 2 tablets 3 times a day       . lovastatin (MEVACOR) 20 MG tablet Take 20 mg by mouth at bedtime.        . Multiple Vitamin (MULTIVITAMIN) tablet Take 1 tablet by mouth daily.        Marland Kitchen warfarin (COUMADIN) 3 MG tablet Take as directed by Anticoagulation clinic   50 tablet  3    Past Medical History  Diagnosis Date  . Paroxysmal atrial fibrillation   . Malleolar fracture   . Hypertension   . Dementia     mild  . Dyslipidemia   . Glaucoma   . Anemia   . Hx of hysterectomy   . Cardiomyopathy     presumed ischemic in the past with an EF of 35%. The most recent EF in 03-2008, however, was 55 -60%  . Bradycardia   . Chronic renal insufficiency     Past Surgical History  Procedure Date  . Hemorrhoid surgery     ROS: As stated in the HPI and negative for all other systems.  PHYSICAL EXAM BP 122/76  Pulse 58  Resp 16  Ht 5\' 2"  (1.575 m)  Wt 169 lb (76.658 kg)  BMI 30.91 kg/m2  PHYSICAL EXAM GEN:  No  distress NECK:  No jugular venous distention at 45 degrees, waveform within normal limits, carotid upstroke brisk and symmetric, no bruits, no thyromegaly LYMPHATICS:  No cervical adenopathy LUNGS:  Clear to auscultation bilaterally BACK:  No CVA tenderness CHEST:  Unremarkable HEART:  S1 and S2 within normal limits, no S3, no S4, no clicks, no rubs, soft apical systolic murmur ABD:  Positive bowel sounds normal in frequency in pitch, no bruits, no rebound, no guarding, no midline pulsatile mass or hepatomeally EXT:  2 plus pulses throughout, mild edema, no cyanosis no clubbing SKIN:  No rashes no nodules NEURO:  Cranial nerves II through XII grossly intact, motor grossly intact throughout PSYCH:  Cognitively intact, oriented to person place and time  ASSESSMENT AND PLAN

## 2011-05-12 NOTE — Assessment & Plan Note (Signed)
I will check a BMET today.

## 2011-05-12 NOTE — Assessment & Plan Note (Signed)
She has had no symptomatic paroxysms. No change in therapy is indicated.

## 2011-05-12 NOTE — Assessment & Plan Note (Signed)
She seems to be euvolemic.  At this point, no change in therapy is indicated.  We have reviewed salt and fluid restrictions.  No further cardiovascular testing is indicated.   

## 2011-05-12 NOTE — Assessment & Plan Note (Signed)
The blood pressure is at target. No change in medications is indicated. We will continue with therapeutic lifestyle changes (TLC).  

## 2011-06-11 ENCOUNTER — Ambulatory Visit (INDEPENDENT_AMBULATORY_CARE_PROVIDER_SITE_OTHER): Payer: No Typology Code available for payment source | Admitting: *Deleted

## 2011-06-11 DIAGNOSIS — I4891 Unspecified atrial fibrillation: Secondary | ICD-10-CM

## 2011-06-11 LAB — POCT INR: INR: 2.2

## 2011-06-12 ENCOUNTER — Other Ambulatory Visit: Payer: Self-pay | Admitting: Cardiology

## 2011-06-29 ENCOUNTER — Other Ambulatory Visit: Payer: Self-pay | Admitting: Cardiology

## 2011-07-02 ENCOUNTER — Other Ambulatory Visit: Payer: Self-pay | Admitting: *Deleted

## 2011-07-02 MED ORDER — HYDRALAZINE HCL 10 MG PO TABS: 10.0000 mg | ORAL_TABLET | Freq: Three times a day (TID) | ORAL | Status: DC

## 2011-07-09 ENCOUNTER — Ambulatory Visit (INDEPENDENT_AMBULATORY_CARE_PROVIDER_SITE_OTHER): Payer: No Typology Code available for payment source | Admitting: *Deleted

## 2011-07-09 DIAGNOSIS — I4891 Unspecified atrial fibrillation: Secondary | ICD-10-CM

## 2011-07-09 LAB — POCT INR: INR: 2

## 2011-07-14 ENCOUNTER — Encounter: Payer: Self-pay | Admitting: Cardiology

## 2011-07-14 ENCOUNTER — Telehealth: Payer: Self-pay | Admitting: Cardiology

## 2011-07-14 NOTE — Telephone Encounter (Addendum)
Refaxed Cardiac records to [facility] (LOV,12,Labs,) to [phone]  07/14/11/km  ROI faxed to [facility] to Obtain Records for [facility]  07/22/11/km  Records received from [facility] gave to [facility]  07/23/11/km

## 2011-07-14 NOTE — Telephone Encounter (Signed)
Received a call from [place]. Patient is an inpatient there. Faxed last 2 office notes, labs, ekg and INR to [phone]. Call back number [phone].djc

## 2011-07-16 HISTORY — PX: CARDIOVERSION: SHX1299

## 2011-07-17 ENCOUNTER — Ambulatory Visit: Payer: No Typology Code available for payment source | Admitting: Cardiology

## 2011-07-20 ENCOUNTER — Ambulatory Visit (INDEPENDENT_AMBULATORY_CARE_PROVIDER_SITE_OTHER): Payer: No Typology Code available for payment source | Admitting: *Deleted

## 2011-07-20 DIAGNOSIS — I4891 Unspecified atrial fibrillation: Secondary | ICD-10-CM

## 2011-07-20 LAB — POCT INR: INR: 2

## 2011-07-23 ENCOUNTER — Ambulatory Visit (INDEPENDENT_AMBULATORY_CARE_PROVIDER_SITE_OTHER): Payer: No Typology Code available for payment source | Admitting: Cardiology

## 2011-07-23 ENCOUNTER — Encounter: Payer: Self-pay | Admitting: Cardiology

## 2011-07-23 VITALS — BP 102/62 | HR 42 | Ht 62.0 in | Wt 171.8 lb

## 2011-07-23 DIAGNOSIS — I495 Sick sinus syndrome: Secondary | ICD-10-CM

## 2011-07-23 DIAGNOSIS — N259 Disorder resulting from impaired renal tubular function, unspecified: Secondary | ICD-10-CM

## 2011-07-23 DIAGNOSIS — I10 Essential (primary) hypertension: Secondary | ICD-10-CM

## 2011-07-23 DIAGNOSIS — I509 Heart failure, unspecified: Secondary | ICD-10-CM

## 2011-07-23 DIAGNOSIS — I4891 Unspecified atrial fibrillation: Secondary | ICD-10-CM

## 2011-07-23 MED ORDER — AMIODARONE HCL 200 MG PO TABS: 200.0000 mg | ORAL_TABLET | Freq: Every day | ORAL | Status: DC

## 2011-07-23 NOTE — Assessment & Plan Note (Signed)
Her blood pressure will be followed with the med changes being made.  Her BP is low now.

## 2011-07-23 NOTE — Assessment & Plan Note (Signed)
I will check with [facility] to get her last labs.

## 2011-07-23 NOTE — Assessment & Plan Note (Signed)
The patient has tachybradycardia syndrome. This is now recurrent atrial fibrillation. She has not tolerated amiodarone or beta blocker in the past because of symptomatic bradycardia. She's now to bradycardic again. She was hospitalized in 2011 with sinus bradycardia. At this point I will reduce the amio but continue a low dose.  I will stop the beta blocker.  If she has recurrent atrial fib she will need to go to the ER.  I will schedule an EP visit for her as I think she needs a pacemaker to treat her tachy/brady syndrome.

## 2011-07-23 NOTE — Progress Notes (Signed)
HPI The patient presents for followup after a hospitalization last week at [facility] for atrial fib with a rapid rate.  I did talk to the MDs there but I don't have a discharge summary.  Her family does report that she was cardioverted. I do have her medications and note that she is back on beta blockers and a higher dose of amiodarone. Her rhythm today is apparently a junctional rhythm with a rate of 42.  At the time of her fibrillation she was symptomatic with dyspnea. She says now that her breathing is back to baseline. She's not having any shortness of breath, PND or orthopnea. She's not noticing any palpitations. She's not having any lightheadedness, presyncope or syncope. She's had no weight gain or edema.  No Known Allergies  Current Outpatient Prescriptions  Medication Sig Dispense Refill  . amiodarone (PACERONE) 400 MG tablet 07/19/11: Please take 400mg s BID for the next 3 days, then take 400mg s QD for next 6 weeks, then take 200mg s QD.      Marland Kitchen atorvastatin (LIPITOR) 10 MG tablet Take 10 mg by mouth daily.        . furosemide (LASIX) 40 MG tablet Take 80 mg by mouth daily.       Marland Kitchen lisinopril (PRINIVIL,ZESTRIL) 5 MG tablet Take 5 mg by mouth daily.        . metoprolol (TOPROL-XL) 200 MG 24 hr tablet Take 200 mg by mouth daily.        . Multiple Vitamin (MULTIVITAMIN) tablet Take 1 tablet by mouth daily.        Marland Kitchen warfarin (COUMADIN) 2 MG tablet Take 2 mg by mouth as directed.          Past Medical History  Diagnosis Date  . Paroxysmal atrial fibrillation   . Malleolar fracture   . Hypertension   . Dementia     mild  . Dyslipidemia   . Glaucoma   . Anemia   . Hx of hysterectomy   . Cardiomyopathy     presumed ischemic in the past with an EF of 35%. The most recent EF in 03-2008, however, was 55 -60%  . Bradycardia   . Chronic renal insufficiency     Past Surgical History  Procedure Date  . Hemorrhoid surgery     ROS: As stated in the HPI and negative for all other  systems.  PHYSICAL EXAM BP 102/62  Pulse 42  Ht 5\' 2"  (1.575 m)  Wt 171 lb 12.8 oz (77.928 kg)  BMI 31.42 kg/m2 GEN:  No distress NECK:  No jugular venous distention at 45 degrees, waveform within normal limits, carotid upstroke brisk and symmetric, no bruits, no thyromegaly LYMPHATICS:  No cervical adenopathy LUNGS:  Clear to auscultation bilaterally BACK:  No CVA tenderness CHEST:  Unremarkable HEART:  S1 and S2 within normal limits, no S3, no clicks, no rubs, soft apical systolic murmur ABD:  Positive bowel sounds normal in frequency in pitch, no bruits, no rebound, no guarding, no midline pulsatile mass or hepatomeally EXT:  2 plus pulses throughout, mild edema, no cyanosis no clubbing SKIN:  No rashes no nodules NEURO:  Cranial nerves II through XII grossly intact, motor grossly intact throughout PSYCH:  Cognitively intact, oriented to person place and time  ASSESSMENT AND PLAN

## 2011-07-23 NOTE — Assessment & Plan Note (Signed)
She seems to be euvolemic.  At this point, no change in therapy is indicated.  We have reviewed salt and fluid restrictions.  No further cardiovascular testing is indicated.   

## 2011-07-23 NOTE — Patient Instructions (Signed)
You are being referred to see a specialist to discuss having a pacemaker placed.  Please decrease your Amiodarone to 200 mg a day. Please stop your metoprolol. Continue all other medications as listed.  Follow up with Dr Percival Spanish in 1 month.

## 2011-07-27 ENCOUNTER — Ambulatory Visit (INDEPENDENT_AMBULATORY_CARE_PROVIDER_SITE_OTHER): Payer: No Typology Code available for payment source | Admitting: *Deleted

## 2011-07-27 DIAGNOSIS — I4891 Unspecified atrial fibrillation: Secondary | ICD-10-CM

## 2011-07-27 LAB — POCT INR: INR: 1.7

## 2011-07-29 ENCOUNTER — Encounter: Payer: No Typology Code available for payment source | Admitting: Cardiology

## 2011-08-03 ENCOUNTER — Ambulatory Visit (INDEPENDENT_AMBULATORY_CARE_PROVIDER_SITE_OTHER): Payer: Medicaid Other | Admitting: *Deleted

## 2011-08-03 DIAGNOSIS — I4891 Unspecified atrial fibrillation: Secondary | ICD-10-CM

## 2011-08-03 LAB — POCT INR: INR: 2

## 2011-08-11 ENCOUNTER — Ambulatory Visit (INDEPENDENT_AMBULATORY_CARE_PROVIDER_SITE_OTHER): Payer: No Typology Code available for payment source | Admitting: *Deleted

## 2011-08-11 DIAGNOSIS — I4891 Unspecified atrial fibrillation: Secondary | ICD-10-CM

## 2011-08-11 LAB — POCT INR: INR: 2.3

## 2011-08-20 ENCOUNTER — Encounter: Payer: Self-pay | Admitting: *Deleted

## 2011-08-20 ENCOUNTER — Encounter: Payer: Self-pay | Admitting: Internal Medicine

## 2011-08-20 ENCOUNTER — Encounter: Payer: No Typology Code available for payment source | Admitting: *Deleted

## 2011-08-20 ENCOUNTER — Ambulatory Visit (INDEPENDENT_AMBULATORY_CARE_PROVIDER_SITE_OTHER): Payer: No Typology Code available for payment source | Admitting: Internal Medicine

## 2011-08-20 DIAGNOSIS — I4891 Unspecified atrial fibrillation: Secondary | ICD-10-CM

## 2011-08-20 DIAGNOSIS — I498 Other specified cardiac arrhythmias: Secondary | ICD-10-CM

## 2011-08-20 DIAGNOSIS — N259 Disorder resulting from impaired renal tubular function, unspecified: Secondary | ICD-10-CM

## 2011-08-20 NOTE — Assessment & Plan Note (Signed)
Creatinine was 2.0+ a year or so ago. We'll need to avoid contrast

## 2011-08-20 NOTE — Progress Notes (Signed)
  HPI  [name] is a 77 y.o. female Seen at the request of Dr. Percival Spanish  because of tachybradycardia syndrome.   t [facility] for atrial fib with a rapid rate.  I did talk to the MDs there but I don't have a discharge summary.  Her family does report that she was cardioverted. I do have her medications and note that she is back on beta blockers and a higher dose of amiodarone. Her rhythm today is apparently a junctional rhythm with a rate of 42.  At the time of her fibrillation she was symptomatic with dyspnea. She says now that her breathing is back to baseline. She's not having any shortness of breath, PND or orthopnea. She's not noticing any palpitations. She's not having any lightheadedness, presyncope or syncope. She's had no weight gain or edema.   Past Medical History  Diagnosis Date  . Paroxysmal atrial fibrillation   . Malleolar fracture   . Hypertension   . Dementia     mild  . Dyslipidemia   . Glaucoma   . Anemia   . Hx of hysterectomy   . Cardiomyopathy     presumed ischemic in the past with an EF of 35%. The most recent EF in 03-2008, however, was 55 -60%  . Bradycardia   . Chronic renal insufficiency     Past Surgical History  Procedure Date  . Hemorrhoid surgery     Current Outpatient Prescriptions  Medication Sig Dispense Refill  . amiodarone (PACERONE) 200 MG tablet Take 1 tablet (200 mg total) by mouth daily. 07/19/11: Please take 400mg s BID for the next 3 days, then take 400mg s QD for next 6 weeks, then take 200mg s QD.  30 tablet  11  . atorvastatin (LIPITOR) 10 MG tablet Take 10 mg by mouth daily.        . furosemide (LASIX) 40 MG tablet Take 80 mg by mouth daily.       Marland Kitchen lisinopril (PRINIVIL,ZESTRIL) 5 MG tablet Take 5 mg by mouth daily.        . Multiple Vitamin (MULTIVITAMIN) tablet Take 1 tablet by mouth daily.        Marland Kitchen warfarin (COUMADIN) 2 MG tablet Take 2 mg by mouth as directed.          No Known Allergies  Review of Systems negative except from HPI  and PMH apart  mild arthritis and cataracts Physical Exam Well developed and well nourished Elderly African American female appearing her stated age in no acute distress HENT normal E scleral and icterus clear Neck Supple JVP flat; carotids brisk and full Clear to ausculation Regular rate and rhythm, no murmurs a widely split S2 and an S4 Soft with active bowel sounds No clubbing cyanosis none Edema Alert and oriented, grossly normal motor and sensory function Skin Warm and Dry Lymph nodes-negative Affect engaging  Sinus rhythm with left bundle branch block  Assessment and  Plan

## 2011-08-20 NOTE — Assessment & Plan Note (Signed)
As above.

## 2011-08-20 NOTE — Assessment & Plan Note (Addendum)
The patient has documented tachybradycardia syndrome with congestive heart failure associated with diastolic dysfunction with rapid atrial fibrillation and drug associated junctional rhythms with heart rates in the 40s. This prompted drug down titration which has helped; however, she continues to have heart rates in the 40s and 50s. These are relatively asymptomatic.  I agree with Dr. Percival Spanish that pacing is indicated. She has no symptoms with her atrial fibrillation and 2 she presents with congestive heart failure. Rate control would certainly help with a lot of this. I have reviewed with her and her daughter the potential risks including but not limited to perforation of lung and heart, infection and lead dislodgment the understand these risks and there willing to proceed.  With her left bundle branch block, her left ventricular ejection fraction is noted to be 55-60% in 2009.

## 2011-08-20 NOTE — Patient Instructions (Signed)

## 2011-08-25 ENCOUNTER — Ambulatory Visit (INDEPENDENT_AMBULATORY_CARE_PROVIDER_SITE_OTHER): Payer: No Typology Code available for payment source | Admitting: *Deleted

## 2011-08-25 DIAGNOSIS — I4891 Unspecified atrial fibrillation: Secondary | ICD-10-CM

## 2011-08-25 LAB — POCT INR: INR: 2.1

## 2011-08-31 ENCOUNTER — Encounter: Payer: Self-pay | Admitting: Cardiology

## 2011-08-31 ENCOUNTER — Ambulatory Visit (INDEPENDENT_AMBULATORY_CARE_PROVIDER_SITE_OTHER): Payer: No Typology Code available for payment source | Admitting: Cardiology

## 2011-08-31 DIAGNOSIS — I4891 Unspecified atrial fibrillation: Secondary | ICD-10-CM

## 2011-08-31 DIAGNOSIS — I495 Sick sinus syndrome: Secondary | ICD-10-CM

## 2011-08-31 DIAGNOSIS — I498 Other specified cardiac arrhythmias: Secondary | ICD-10-CM

## 2011-08-31 DIAGNOSIS — I10 Essential (primary) hypertension: Secondary | ICD-10-CM

## 2011-08-31 MED ORDER — AMIODARONE HCL 200 MG PO TABS: 200.0000 mg | ORAL_TABLET | Freq: Every day | ORAL | Status: DC

## 2011-08-31 NOTE — Assessment & Plan Note (Signed)
She is considering whether she wants a pacemaker but she is scheduled to have this in January. For now she is continuing the medications as listed.

## 2011-08-31 NOTE — Progress Notes (Signed)
HPI The patient presents for followup after a hospitalization recently [facility] for atrial fib with a rapid rate. At the time of that hospitalization she had cardioversion. Since then she has been on amiodarone. Because she had severe symptomatic bradycardia on amiodarone and beta blockers in the past I discontinued the beta blocker. I have reduced the amiodarone. She brought a blood pressure diary today which demonstrates her blood pressure to be well controlled. Heart rates are typically in the high to mid 40s. She is to have a pacemaker placed for tachybradycardia syndrome and has seen Dr. Caryl Comes. She denies any chest pressure, neck or arm discomfort. She has no shortness of breath, PND or orthopnea.   No Known Allergies  Current Outpatient Prescriptions  Medication Sig Dispense Refill  . amiodarone (PACERONE) 200 MG tablet Take 1 tablet (200 mg total) by mouth daily. 07/19/11: Please take 400mg s BID for the next 3 days, then take 400mg s QD for next 6 weeks, then take 200mg s QD.  30 tablet  11  . atorvastatin (LIPITOR) 10 MG tablet Take 10 mg by mouth daily.        . furosemide (LASIX) 40 MG tablet Take 80 mg by mouth daily.       Marland Kitchen lisinopril (PRINIVIL,ZESTRIL) 5 MG tablet Take 5 mg by mouth daily.        . Multiple Vitamin (MULTIVITAMIN) tablet Take 1 tablet by mouth daily.        Marland Kitchen warfarin (COUMADIN) 2 MG tablet Take 2 mg by mouth as directed.          Past Medical History  Diagnosis Date  . Paroxysmal atrial fibrillation   . Malleolar fracture   . Hypertension   . Dementia     mild  . Dyslipidemia   . Glaucoma   . Anemia   . Hx of hysterectomy   . Cardiomyopathy     presumed ischemic in the past with an EF of 35%. The most recent EF in 03-2008, however, was 55 -60%  . Bradycardia   . Chronic renal insufficiency     Past Surgical History  Procedure Date  . Hemorrhoid surgery     ROS: As stated in the HPI and negative for all other systems.  PHYSICAL EXAM BP 148/70  Pulse 54   Ht 5\' 2"  (1.575 m)  Wt 169 lb 12.8 oz (77.021 kg)  BMI 31.06 kg/m2 GEN:  No distress NECK:  No jugular venous distention at 45 degrees, waveform within normal limits, carotid upstroke brisk and symmetric, no bruits, no thyromegaly LYMPHATICS:  No cervical adenopathy LUNGS:  Clear to auscultation bilaterally BACK:  No CVA tenderness CHEST:  Unremarkable HEART:  S1 and S2 within normal limits, no S3, no clicks, no rubs, soft apical systolic murmur ABD:  Positive bowel sounds normal in frequency in pitch, no bruits, no rebound, no guarding, no midline pulsatile mass or hepatomeally EXT:  2 plus pulses throughout, mild edema, no cyanosis no clubbing SKIN:  No rashes no nodules NEURO:  Cranial nerves II through XII grossly intact, motor grossly intact throughout PSYCH:  Cognitively intact, oriented to person place and time, pleasantly confused  ASSESSMENT AND PLAN

## 2011-08-31 NOTE — Patient Instructions (Signed)
The current medical regimen is effective;  continue present plan and medications.  Follow up with Dr Percival Spanish in February 2013.

## 2011-08-31 NOTE — Assessment & Plan Note (Signed)
Her blood pressure is controlled and she will continue the medications as listed.

## 2011-08-31 NOTE — Assessment & Plan Note (Signed)
She is maintaining sinus rhythm. I will review the amiodarone taper and make sure she is on the appropriate dose. She will have the appropriate followup labs.

## 2011-09-01 ENCOUNTER — Encounter (HOSPITAL_COMMUNITY): Payer: Self-pay

## 2011-09-04 ENCOUNTER — Other Ambulatory Visit: Payer: Self-pay | Admitting: Internal Medicine

## 2011-09-04 DIAGNOSIS — I498 Other specified cardiac arrhythmias: Secondary | ICD-10-CM

## 2011-09-11 ENCOUNTER — Ambulatory Visit (INDEPENDENT_AMBULATORY_CARE_PROVIDER_SITE_OTHER): Payer: No Typology Code available for payment source | Admitting: *Deleted

## 2011-09-11 DIAGNOSIS — I4891 Unspecified atrial fibrillation: Secondary | ICD-10-CM

## 2011-09-11 DIAGNOSIS — N259 Disorder resulting from impaired renal tubular function, unspecified: Secondary | ICD-10-CM

## 2011-09-11 LAB — CBC WITH DIFFERENTIAL/PLATELET
Basophils Absolute: 0 10*3/uL (ref 0.0–0.1)
Basophils Relative: 0 % (ref 0–1)
Eosinophils Absolute: 0.1 10*3/uL (ref 0.0–0.7)
Eosinophils Relative: 1 % (ref 0–5)
HCT: 37.6 % (ref 36.0–46.0)
Hemoglobin: 12.4 g/dL (ref 12.0–15.0)
Lymphocytes Relative: 15 % (ref 12–46)
Lymphs Abs: 1.4 10*3/uL (ref 0.7–4.0)
MCH: 31.2 pg (ref 26.0–34.0)
MCHC: 33 g/dL (ref 30.0–36.0)
MCV: 94.7 fL (ref 78.0–100.0)
Monocytes Absolute: 0.5 10*3/uL (ref 0.1–1.0)
Monocytes Relative: 5 % (ref 3–12)
Neutro Abs: 7.4 10*3/uL (ref 1.7–7.7)
Neutrophils Relative %: 79 % — ABNORMAL HIGH (ref 43–77)
Platelets: 194 10*3/uL (ref 150–400)
RBC: 3.97 MIL/uL (ref 3.87–5.11)
RDW: 13.3 % (ref 11.5–15.5)
WBC: 9.3 10*3/uL (ref 4.0–10.5)

## 2011-09-11 LAB — BASIC METABOLIC PANEL
BUN: 29 mg/dL — ABNORMAL HIGH (ref 6–23)
CO2: 36 mEq/L — ABNORMAL HIGH (ref 19–32)
Calcium: 9.4 mg/dL (ref 8.4–10.5)
Chloride: 99 mEq/L (ref 96–112)
Creatinine, Ser: 1.2 mg/dL (ref 0.4–1.2)
GFR: 53.72 mL/min — ABNORMAL LOW (ref >60.00)
Glucose, Bld: 82 mg/dL (ref 70–99)
Potassium: 3.3 mEq/L — ABNORMAL LOW (ref 3.5–5.1)
Sodium: 143 mEq/L (ref 135–145)

## 2011-09-11 LAB — PROTIME-INR
INR: 2.8 ratio — ABNORMAL HIGH (ref 0.8–1.0)
Prothrombin Time: 31.2 s — ABNORMAL HIGH (ref 10.2–12.4)

## 2011-09-16 ENCOUNTER — Telehealth: Payer: Self-pay | Admitting: Cardiology

## 2011-09-16 ENCOUNTER — Encounter: Payer: No Typology Code available for payment source | Admitting: *Deleted

## 2011-09-16 MED ORDER — CHLORHEXIDINE GLUCONATE 4 % EX LIQD
60.0000 mL | Freq: Once | CUTANEOUS | Status: DC
  Filled 2011-09-16: qty 60

## 2011-09-16 MED ORDER — SODIUM CHLORIDE 0.9 % IR SOLN
80.0000 mg | Status: DC
  Filled 2011-09-16 (×2): qty 2

## 2011-09-16 MED ORDER — CEFAZOLIN SODIUM 1-5 GM-% IV SOLN: 1.0000 g | INTRAVENOUS | Status: DC

## 2011-09-16 NOTE — Telephone Encounter (Signed)
New Problem:     Patient called wondering if she should hold her coumadin for her procedure tomorrow.

## 2011-09-16 NOTE — Telephone Encounter (Signed)
I spoke with the patient at 5:30pm (I was waiting for Dr. Caryl Comes to call back). She had just taken her coumadin. I advised I was going to review with Dr. Caryl Comes and that we may need to have her eat some greens tonight as well as take in extra potassium in her diet. I advised I would call her back after speaking with Dr. Caryl Comes. Alvis Lemmings, RN, BSN   Per Dr. Caryl Comes, he is ok with readings. He will supplement potassium tomorrow at the hospital. She may take in greens in her diet tonight. I have left a message for the patient at her contact number of these recommendations, and that we will proceed with her procedure as planned tonight. Alvis Lemmings, RN, BSN

## 2011-09-16 NOTE — Telephone Encounter (Signed)
FU Call: Pt wanting to know if pt needs to take coumadin today. Please return pt call to discuss further.

## 2011-09-17 ENCOUNTER — Ambulatory Visit (HOSPITAL_COMMUNITY): Payer: No Typology Code available for payment source

## 2011-09-17 ENCOUNTER — Ambulatory Visit (HOSPITAL_COMMUNITY)
Admission: RE | Admit: 2011-09-17 | Discharge: 2011-09-19 | Disposition: A | Discharge status: Home / Self Care | Payer: No Typology Code available for payment source | Source: Ambulatory Visit | Attending: Internal Medicine | Admitting: Internal Medicine

## 2011-09-17 ENCOUNTER — Telehealth: Payer: Self-pay | Admitting: Internal Medicine

## 2011-09-17 ENCOUNTER — Encounter (HOSPITAL_COMMUNITY)
Admission: RE | Disposition: A | Discharge status: Home / Self Care | Payer: Self-pay | Source: Ambulatory Visit | Attending: Internal Medicine

## 2011-09-17 DIAGNOSIS — I495 Sick sinus syndrome: Secondary | ICD-10-CM

## 2011-09-17 DIAGNOSIS — I129 Hypertensive chronic kidney disease with stage 1 through stage 4 chronic kidney disease, or unspecified chronic kidney disease: Secondary | ICD-10-CM | POA: Insufficient documentation

## 2011-09-17 DIAGNOSIS — N183 Chronic kidney disease, stage 3 unspecified: Secondary | ICD-10-CM | POA: Insufficient documentation

## 2011-09-17 DIAGNOSIS — Y9210 Unspecified residential institution as the place of occurrence of the external cause: Secondary | ICD-10-CM | POA: Insufficient documentation

## 2011-09-17 DIAGNOSIS — T82190A Other mechanical complication of cardiac electrode, initial encounter: Secondary | ICD-10-CM | POA: Insufficient documentation

## 2011-09-17 DIAGNOSIS — I498 Other specified cardiac arrhythmias: Secondary | ICD-10-CM

## 2011-09-17 DIAGNOSIS — I4891 Unspecified atrial fibrillation: Secondary | ICD-10-CM | POA: Insufficient documentation

## 2011-09-17 DIAGNOSIS — E785 Hyperlipidemia, unspecified: Secondary | ICD-10-CM | POA: Insufficient documentation

## 2011-09-17 DIAGNOSIS — H409 Unspecified glaucoma: Secondary | ICD-10-CM | POA: Insufficient documentation

## 2011-09-17 DIAGNOSIS — Y831 Surgical operation with implant of artificial internal device as the cause of abnormal reaction of the patient, or of later complication, without mention of misadventure at the time of the procedure: Secondary | ICD-10-CM | POA: Insufficient documentation

## 2011-09-17 DIAGNOSIS — F0390 Unspecified dementia without behavioral disturbance: Secondary | ICD-10-CM | POA: Insufficient documentation

## 2011-09-17 DIAGNOSIS — N189 Chronic kidney disease, unspecified: Secondary | ICD-10-CM | POA: Insufficient documentation

## 2011-09-17 HISTORY — DX: Unspecified osteoarthritis, unspecified site: M19.90

## 2011-09-17 HISTORY — PX: PERMANENT PACEMAKER INSERTION: SHX5480

## 2011-09-17 HISTORY — PX: INSERT / REPLACE / REMOVE PACEMAKER: SUR710

## 2011-09-17 LAB — PROTIME-INR
INR: 2.73 — ABNORMAL HIGH (ref 0.00–1.49)
Prothrombin Time: 29.4 seconds — ABNORMAL HIGH (ref 11.6–15.2)

## 2011-09-17 LAB — BASIC METABOLIC PANEL
BUN: 33 mg/dL — ABNORMAL HIGH (ref 6–23)
CO2: 33 mEq/L — ABNORMAL HIGH (ref 19–32)
Calcium: 9.8 mg/dL (ref 8.4–10.5)
Chloride: 98 mEq/L (ref 96–112)
Creatinine, Ser: 1.41 mg/dL — ABNORMAL HIGH (ref 0.50–1.10)
GFR calc Af Amer: 38 mL/min — ABNORMAL LOW (ref >90)
GFR calc non Af Amer: 33 mL/min — ABNORMAL LOW (ref >90)
Glucose, Bld: 105 mg/dL — ABNORMAL HIGH (ref 70–99)
Potassium: 2.8 mEq/L — ABNORMAL LOW (ref 3.5–5.1)
Sodium: 145 mEq/L (ref 135–145)

## 2011-09-17 LAB — SURGICAL PCR SCREEN
MRSA, PCR: NEGATIVE
Staphylococcus aureus: NEGATIVE

## 2011-09-17 SURGERY — PERMANENT PACEMAKER INSERTION
Anesthesia: LOCAL

## 2011-09-17 MED ORDER — FUROSEMIDE 80 MG PO TABS
80.0000 mg | ORAL_TABLET | Freq: Every day | ORAL | Status: DC
  Filled 2011-09-17 (×2): qty 1

## 2011-09-17 MED ORDER — CEFAZOLIN SODIUM 1-5 GM-% IV SOLN
INTRAVENOUS | Status: AC
  Filled 2011-09-17: qty 50

## 2011-09-17 MED ORDER — SODIUM CHLORIDE 0.9 % IR SOLN
80.0000 mg | Status: DC
  Filled 2011-09-17: qty 2

## 2011-09-17 MED ORDER — POTASSIUM CHLORIDE CRYS ER 20 MEQ PO TBCR
EXTENDED_RELEASE_TABLET | ORAL | Status: AC
  Filled 2011-09-17: qty 2

## 2011-09-17 MED ORDER — CEFAZOLIN SODIUM 1-5 GM-% IV SOLN
1.0000 g | Freq: Four times a day (QID) | INTRAVENOUS | Status: DC
  Filled 2011-09-17 (×3): qty 50

## 2011-09-17 MED ORDER — SODIUM CHLORIDE 0.9 % IV SOLN
INTRAVENOUS | Status: DC
  Administered 2011-09-18: 07:00:00 via INTRAVENOUS

## 2011-09-17 MED ORDER — POTASSIUM CHLORIDE CRYS ER 20 MEQ PO TBCR
40.0000 meq | EXTENDED_RELEASE_TABLET | Freq: Once | ORAL | Status: AC
  Administered 2011-09-17: 40 meq via ORAL
  Filled 2011-09-17: qty 2

## 2011-09-17 MED ORDER — WARFARIN SODIUM 2 MG PO TABS
2.0000 mg | ORAL_TABLET | ORAL | Status: DC
  Administered 2011-09-17: 2 mg via ORAL
  Filled 2011-09-17 (×2): qty 1

## 2011-09-17 MED ORDER — POTASSIUM CHLORIDE CRYS ER 20 MEQ PO TBCR
20.0000 meq | EXTENDED_RELEASE_TABLET | Freq: Every day | ORAL | Status: DC
  Administered 2011-09-18 – 2011-09-19 (×2): 20 meq via ORAL
  Filled 2011-09-17 (×2): qty 1

## 2011-09-17 MED ORDER — WARFARIN SODIUM 3 MG PO TABS
3.0000 mg | ORAL_TABLET | ORAL | Status: DC
  Administered 2011-09-18: 3 mg via ORAL
  Filled 2011-09-17: qty 1

## 2011-09-17 MED ORDER — WARFARIN SODIUM 2 MG PO TABS: 2.0000 mg | ORAL_TABLET | Freq: Every day | ORAL | Status: DC

## 2011-09-17 MED ORDER — ACETAMINOPHEN 325 MG PO TABS: 325.0000 mg | ORAL_TABLET | ORAL | Status: DC | PRN

## 2011-09-17 MED ORDER — HEPARIN (PORCINE) IN NACL 2-0.9 UNIT/ML-% IJ SOLN
INTRAMUSCULAR | Status: AC
  Filled 2011-09-17: qty 1000

## 2011-09-17 MED ORDER — LISINOPRIL 5 MG PO TABS
5.0000 mg | ORAL_TABLET | Freq: Every day | ORAL | Status: DC
  Administered 2011-09-18 – 2011-09-19 (×2): 5 mg via ORAL
  Filled 2011-09-17 (×3): qty 1

## 2011-09-17 MED ORDER — LIDOCAINE HCL (PF) 1 % IJ SOLN
INTRAMUSCULAR | Status: AC
  Filled 2011-09-17: qty 60

## 2011-09-17 MED ORDER — FENTANYL CITRATE 0.05 MG/ML IJ SOLN
INTRAMUSCULAR | Status: AC
  Filled 2011-09-17: qty 2

## 2011-09-17 MED ORDER — ONDANSETRON HCL 4 MG/2ML IJ SOLN: 4.0000 mg | Freq: Four times a day (QID) | INTRAMUSCULAR | Status: DC | PRN

## 2011-09-17 MED ORDER — SODIUM CHLORIDE 0.9 % IJ SOLN: 3.0000 mL | INTRAMUSCULAR | Status: DC | PRN

## 2011-09-17 MED ORDER — LIDOCAINE HCL (PF) 1 % IJ SOLN
INTRAMUSCULAR | Status: AC
  Filled 2011-09-17: qty 30

## 2011-09-17 MED ORDER — SODIUM CHLORIDE 0.45 % IV SOLN
INTRAVENOUS | Status: DC
  Administered 2011-09-18: 07:00:00 via INTRAVENOUS

## 2011-09-17 MED ORDER — SODIUM CHLORIDE 0.9 % IV SOLN: 250.0000 mL | INTRAVENOUS | Status: DC | PRN

## 2011-09-17 MED ORDER — TRAVOPROST 0.004 % OP SOLN
1.0000 [drp] | Freq: Every day | OPHTHALMIC | Status: DC
  Filled 2011-09-17 (×4): qty 0.1

## 2011-09-17 MED ORDER — TRAVOPROST (BAK FREE) 0.004 % OP SOLN
1.0000 [drp] | Freq: Every day | OPHTHALMIC | Status: DC
  Administered 2011-09-17 – 2011-09-18 (×2): 1 [drp] via OPHTHALMIC
  Filled 2011-09-17: qty 2.5

## 2011-09-17 MED ORDER — OLOPATADINE HCL 0.1 % OP SOLN
1.0000 [drp] | Freq: Two times a day (BID) | OPHTHALMIC | Status: DC
  Administered 2011-09-17 – 2011-09-19 (×3): 1 [drp] via OPHTHALMIC
  Filled 2011-09-17: qty 5

## 2011-09-17 MED ORDER — AMIODARONE HCL 200 MG PO TABS
200.0000 mg | ORAL_TABLET | Freq: Every day | ORAL | Status: DC
  Administered 2011-09-18 – 2011-09-19 (×2): 200 mg via ORAL
  Filled 2011-09-17 (×3): qty 1

## 2011-09-17 MED ORDER — SIMVASTATIN 20 MG PO TABS
20.0000 mg | ORAL_TABLET | Freq: Every day | ORAL | Status: DC
  Administered 2011-09-18 – 2011-09-19 (×2): 20 mg via ORAL
  Filled 2011-09-17 (×3): qty 1

## 2011-09-17 MED ORDER — ACETAMINOPHEN 325 MG PO TABS
650.0000 mg | ORAL_TABLET | Freq: Four times a day (QID) | ORAL | Status: DC
  Administered 2011-09-17: 650 mg via ORAL

## 2011-09-17 MED ORDER — POTASSIUM CHLORIDE CRYS ER 20 MEQ PO TBCR
40.0000 meq | EXTENDED_RELEASE_TABLET | Freq: Once | ORAL | Status: AC
  Administered 2011-09-17: 40 meq via ORAL

## 2011-09-17 MED ORDER — MUPIROCIN 2 % EX OINT
TOPICAL_OINTMENT | Freq: Once | CUTANEOUS | Status: DC
  Filled 2011-09-17 (×2): qty 22

## 2011-09-17 MED ORDER — SODIUM CHLORIDE 0.9 % IJ SOLN
3.0000 mL | Freq: Two times a day (BID) | INTRAMUSCULAR | Status: DC
  Administered 2011-09-17: 3 mL via INTRAVENOUS

## 2011-09-17 MED ORDER — SODIUM CHLORIDE 0.9 % IV SOLN
INTRAVENOUS | Status: DC
  Administered 2011-09-17: 07:00:00 via INTRAVENOUS

## 2011-09-17 MED ORDER — MIDAZOLAM HCL 5 MG/5ML IJ SOLN
INTRAMUSCULAR | Status: AC
  Filled 2011-09-17: qty 5

## 2011-09-17 MED ORDER — CEFAZOLIN SODIUM 1-5 GM-% IV SOLN
1.0000 g | INTRAVENOUS | Status: DC
  Filled 2011-09-17: qty 50

## 2011-09-17 MED ORDER — SODIUM CHLORIDE 0.45 % IV SOLN: INTRAVENOUS | Status: DC

## 2011-09-17 NOTE — Progress Notes (Signed)
Pt dislodged atrial lead,  Will need revision in am Orders written

## 2011-09-17 NOTE — Progress Notes (Signed)
1800 Noted pacing at the end of the  R complex of paced beats and other areas inconsistent with proper sensing and pacing. Rosaria Ferries PA notified of change in rhythm noted. Chest xray being done at this time at bedside.  [place] PA notified of results of chest xray. Pacemaker rep into interrogate and in contact with Dr. Caryl Comes. Reprogramming of pacer completed and symptoms resolved.    Dina Rich RN

## 2011-09-17 NOTE — Telephone Encounter (Signed)
New Problem:    Patient's daughter is calling because her mother had a pacemaker placed today and now she has an unusual tremor, says that its like a hiccup but not, in her stomach. None of the staff in the hospital could figure out what was going on. Please advise.

## 2011-09-17 NOTE — Interval H&P Note (Signed)
History and Physical Interval Note:  09/17/2011 7:50 AM  Marion Downer  has presented today for surgery, with the diagnosis of tachy/brady  The various methods of treatment have been discussed with the patient and family. After consideration of risks, benefits and other options for treatment, the patient has consented to  Procedure(s): PERMANENT PACEMAKER INSERTION as a surgical intervention .  The patients' history has been reviewed, patient examined, no change in status, stable for surgery.  I have reviewed the patients' chart and labs.  Questions were answered to the patient's satisfaction.     Virl Axe

## 2011-09-17 NOTE — H&P (View-Only) (Signed)
  HPI  [name] is a 78 y.o. female Seen at the request of Dr. Percival Spanish  because of tachybradycardia syndrome.   t [facility] for atrial fib with a rapid rate.  I did talk to the MDs there but I don't have a discharge summary.  Her family does report that she was cardioverted. I do have her medications and note that she is back on beta blockers and a higher dose of amiodarone. Her rhythm today is apparently a junctional rhythm with a rate of 42.  At the time of her fibrillation she was symptomatic with dyspnea. She says now that her breathing is back to baseline. She's not having any shortness of breath, PND or orthopnea. She's not noticing any palpitations. She's not having any lightheadedness, presyncope or syncope. She's had no weight gain or edema.   Past Medical History  Diagnosis Date  . Paroxysmal atrial fibrillation   . Malleolar fracture   . Hypertension   . Dementia     mild  . Dyslipidemia   . Glaucoma   . Anemia   . Hx of hysterectomy   . Cardiomyopathy     presumed ischemic in the past with an EF of 35%. The most recent EF in 03-2008, however, was 55 -60%  . Bradycardia   . Chronic renal insufficiency     Past Surgical History  Procedure Date  . Hemorrhoid surgery     Current Outpatient Prescriptions  Medication Sig Dispense Refill  . amiodarone (PACERONE) 200 MG tablet Take 1 tablet (200 mg total) by mouth daily. 07/19/11: Please take 400mg s BID for the next 3 days, then take 400mg s QD for next 6 weeks, then take 200mg s QD.  30 tablet  11  . atorvastatin (LIPITOR) 10 MG tablet Take 10 mg by mouth daily.        . furosemide (LASIX) 40 MG tablet Take 80 mg by mouth daily.       Marland Kitchen lisinopril (PRINIVIL,ZESTRIL) 5 MG tablet Take 5 mg by mouth daily.        . Multiple Vitamin (MULTIVITAMIN) tablet Take 1 tablet by mouth daily.        Marland Kitchen warfarin (COUMADIN) 2 MG tablet Take 2 mg by mouth as directed.          No Known Allergies  Review of Systems negative except from HPI  and PMH apart  mild arthritis and cataracts Physical Exam Well developed and well nourished Elderly African American female appearing her stated age in no acute distress HENT normal E scleral and icterus clear Neck Supple JVP flat; carotids brisk and full Clear to ausculation Regular rate and rhythm, no murmurs a widely split S2 and an S4 Soft with active bowel sounds No clubbing cyanosis none Edema Alert and oriented, grossly normal motor and sensory function Skin Warm and Dry Lymph nodes-negative Affect engaging  Sinus rhythm with left bundle branch block  Assessment and  Plan

## 2011-09-17 NOTE — Brief Op Note (Signed)
09/17/2011  9:40 AM  PATIENT:  [name]  78 y.o. female  PRE-OPERATIVE DIAGNOSIS:  tachy/brady  POST-OPERATIVE DIAGNOSIS: same  PROCEDURE:  Procedure(s): PERMANENT PACEMAKER INSERTION  SURGEON:  Surgeon(s): Deboraha Sprang, MD  PHYSICIAN ASSISTANT:   ASSISTANTS: none   ANESTHESIA:   local and IV sedation   DICTATION: .Other Dictation: Dictation Number [id number]

## 2011-09-17 NOTE — Op Note (Signed)
[name], [name]               ACCOUNT NO.:  [id number]  MEDICAL RECORD NO.:  [id number]  LOCATION:  [facility]                         FACILITY:  [facility]  PHYSICIAN:  Deboraha Sprang, MD, FACCDATE OF BIRTH:  28-Nov-1922  DATE OF PROCEDURE:  09/17/2011 DATE OF DISCHARGE:                              OPERATIVE REPORT   PREOPERATIVE DIAGNOSIS:  Tachy-brady syndrome, atrial fibrillation with junctional bradycardia.  POSTOPERATIVE DIAGNOSIS:  Tachy-brady syndrome, atrial fibrillation with junctional bradycardia.  PROCEDURE:  Dual-chamber pacemaker implantation.  SURGEON:  Deboraha Sprang, MD, [facility]  DESCRIPTION OF PROCEDURE:  Following obtained informed consent, the patient was brought to the electrophysiology laboratory and placed on the fluoroscopic table in supine position.  After routine prep and drape of the left upper chest, lidocaine was infiltrated in the prepectoral subclavicular region.  An incision was made and carried down to the layer of the prepectoral fascia with electrocautery and sharp dissection.  A pocket was formed similarly.  Hemostasis was obtained.  Thereafter attention was turned to gain access to the extrathoracic left subclavian vein which was accomplished without difficulty without the aspiration of air.  However, there was an interval for about 10 minutes while fluoro did work.  Two separate venipunctures were obtained. Guidewires were placed and retained.  Sequentially, 7-French sheaths were placed through which we passed a [place], 52-cm passive fixation ventricular lead, serial [id number] and a [employer] 1944 passive fixation atrial lead, serial [id number].  Under fluoroscopic guidance, these were manipulated at the right ventricular apex and right atrial appendage respectively where bipolar R-wave was 8.4, pace impedance of 847 with threshold 0.3 at 0.5.  Current of threshold was 0.4 mA.  There was no diaphragmatic pacing at 10 V.  Bipolar P-wave  was 3.2 with a pace impedance of 475 with a threshold of 0.4 at 0.5.  There was no diaphragmatic pacing at 10 V.  The current of threshold was 0.9 mA.  With these acceptable parameters recorded, the leads were secured to the prepectoral fascia and then attached to Medtronic Adapta L pulse generator, serial number [id number]. Ventricular pacing and atrial pacing were identified.  The pocket was copiously irrigated with antibiotic containing saline solution. Hemostasis was assured.  Leads and pulse generator were placed and the pocket secured to the prepectoral fascia and Surgicel was used anteriorly, posteriorly, cephalad, and laterally as the patient's INR was 2.7.  The pocket was closed in 3 layers in normal fashion.  The wound was washed, dried, and a benzoin and Steri-Strip dressing was applied.  Needle counts, sponge counts, and instrument counts were correct at the end of the procedure according to the staff. The patient tolerated the procedure without apparent complication.     Deboraha Sprang, MD, [facility]     SCK/MEDQ  D:  09/17/2011  T:  09/17/2011  Job:  [id number]

## 2011-09-17 NOTE — Progress Notes (Signed)
BMET pending, Aaron Edelman in cath lab advised.  Will send pt to Cath lab holding to await results per his instructions.

## 2011-09-17 NOTE — Telephone Encounter (Signed)
This is like diaphragmatic stimulation from the right ventricular lead which would be by perforation or the right atrial lead which would be by lead dislodgment. We will have the device interrogated. Get a portable chest x-ray.

## 2011-09-17 NOTE — Telephone Encounter (Signed)
I left a message on the daughter's voice mail that someone is coming to interrogate the device and that she will be going for a chest x-ray.

## 2011-09-17 NOTE — Telephone Encounter (Signed)
Routing to Dr. Klein

## 2011-09-17 NOTE — Progress Notes (Signed)
[place] Notified Rosaria Ferries that patient had complaints of movement in lateral right abdomen. Noted movement in lateral right abdomen. Same movement noted to lesser degree across abdomen and diaphromatic area to left side. Pacer sensing and firing properly on cardiac monitor. K 2.9 in am with 40 meq K given at 1100. Orders given for K 40 meq po.        Dina Rich RN

## 2011-09-18 ENCOUNTER — Other Ambulatory Visit: Payer: Self-pay

## 2011-09-18 ENCOUNTER — Encounter (HOSPITAL_COMMUNITY): Payer: Self-pay | Admitting: General Practice

## 2011-09-18 ENCOUNTER — Ambulatory Visit (HOSPITAL_COMMUNITY): Payer: No Typology Code available for payment source

## 2011-09-18 ENCOUNTER — Encounter (HOSPITAL_COMMUNITY)
Admission: RE | Disposition: A | Discharge status: Home / Self Care | Payer: Self-pay | Source: Ambulatory Visit | Attending: Internal Medicine

## 2011-09-18 DIAGNOSIS — T82190A Other mechanical complication of cardiac electrode, initial encounter: Secondary | ICD-10-CM

## 2011-09-18 HISTORY — PX: PACEMAKER REVISION: SHX5482

## 2011-09-18 HISTORY — PX: INSERT / REPLACE / REMOVE PACEMAKER: SUR710

## 2011-09-18 LAB — BASIC METABOLIC PANEL
BUN: 22 mg/dL (ref 6–23)
CO2: 34 mEq/L — ABNORMAL HIGH (ref 19–32)
Calcium: 9.4 mg/dL (ref 8.4–10.5)
Chloride: 104 mEq/L (ref 96–112)
Creatinine, Ser: 1.33 mg/dL — ABNORMAL HIGH (ref 0.50–1.10)
GFR calc Af Amer: 41 mL/min — ABNORMAL LOW (ref >90)
GFR calc non Af Amer: 35 mL/min — ABNORMAL LOW (ref >90)
Glucose, Bld: 101 mg/dL — ABNORMAL HIGH (ref 70–99)
Potassium: 4 mEq/L (ref 3.5–5.1)
Sodium: 144 mEq/L (ref 135–145)

## 2011-09-18 LAB — PROTIME-INR
INR: 2.84 — ABNORMAL HIGH (ref 0.00–1.49)
Prothrombin Time: 30.3 seconds — ABNORMAL HIGH (ref 11.6–15.2)

## 2011-09-18 SURGERY — PACEMAKER REVISION
Anesthesia: Moderate Sedation

## 2011-09-18 MED ORDER — SODIUM CHLORIDE 0.9 % IJ SOLN: 3.0000 mL | INTRAMUSCULAR | Status: DC | PRN

## 2011-09-18 MED ORDER — CEFAZOLIN SODIUM 1-5 GM-% IV SOLN
1.0000 g | Freq: Four times a day (QID) | INTRAVENOUS | Status: AC
  Administered 2011-09-18 – 2011-09-19 (×3): 1 g via INTRAVENOUS
  Filled 2011-09-18 (×4): qty 50

## 2011-09-18 MED ORDER — LIDOCAINE HCL (PF) 1 % IJ SOLN
INTRAMUSCULAR | Status: AC
  Filled 2011-09-18: qty 60

## 2011-09-18 MED ORDER — HYDROCODONE-ACETAMINOPHEN 5-325 MG PO TABS: 1.0000 | ORAL_TABLET | ORAL | Status: DC | PRN

## 2011-09-18 MED ORDER — SODIUM CHLORIDE 0.9 % IJ SOLN
3.0000 mL | Freq: Two times a day (BID) | INTRAMUSCULAR | Status: DC
  Administered 2011-09-18: 3 mL via INTRAVENOUS

## 2011-09-18 MED ORDER — ONDANSETRON HCL 4 MG/2ML IJ SOLN: 4.0000 mg | Freq: Four times a day (QID) | INTRAMUSCULAR | Status: DC | PRN

## 2011-09-18 MED ORDER — SODIUM CHLORIDE 0.9 % IV SOLN: 250.0000 mL | INTRAVENOUS | Status: DC | PRN

## 2011-09-18 NOTE — Progress Notes (Signed)
ELECTROPHYSIOLOGY ROUNDING NOTE    Patient Name: [name] Date of Encounter: 09-18-2011    [place] post pacemaker implant 09-17-2011 for tachy-brady syndrome.   Was doing well post procedure until she got up yesterday afternoon to use restroom and subsequently noticed diaphragmatic stim.  Portable CXR done and reviewed by Dr Caryl Comes which demonstrated dislodgement of atrial lead.  Patient scheduled for lead revision today.  No chest pain or shortness of breath, did not sleep well last night.  TELEMETRY: Reviewed telemetry pt in sinus bradycardia: Filed Vitals:   09/17/11 1900 09/17/11 2000 09/17/11 2024 09/18/11 0106  BP: 148/70 102/52 100/61 109/47  Pulse: 57 59 59 47  Temp:   97.7 F (36.5 C)   TempSrc:   Oral   Resp: 16 14 17 14   Height:      Weight:      SpO2: 98% 97% 98% 98%    LABS: Basic Metabolic Panel:  Basename 09/18/11 0423 09/17/11 0602  NA 144 145  K 4.0 2.8*  CL 104 98  CO2 34* 33*  GLUCOSE 101* 105*  BUN 22 33*  CREATININE 1.33* 1.41*  CALCIUM 9.4 9.8  MG -- --  PHOS -- --   INR 2.84  Radiology/Studies: Dg Chest Port 1 View 09/17/2011  *RADIOLOGY REPORT*  Clinical Data: Post pacemaker, question lead dislodgement  PORTABLE CHEST - 1 VIEW  Comparison: Portable exam 1824 hours compared to pre-procedure exam of 09/17/2011  Findings: Interval placement of sequential left subclavian pacemaker. One pacemaker lead projects over the SVC. Second pacemaker lead projects over the right ventricle. Enlargement of cardiac silhouette. Tortuous aorta. Minimal pulmonary vascular congestion. Peribronchial thickening with minimal basilar atelectasis. Lordotic positioning. No pneumothorax.  IMPRESSION: Interval placement of left subclavian sequential pacemaker, with one lead projecting over the right ventricle and the second lead projecting over the SVC. Enlargement cardiac silhouette with minimal pulmonary vascular congestion. Minimal basilar atelectasis.  Critical  Value/emergent results were called by telephone at the time of interpretation on 09/17/2011  at 1837 hours  to  patient's nurse Mickel Baas RN on [address], who verbally acknowledged these results.  Original Report Authenticated By: Burnetta Sabin, M.D.   PHYSICAL EXAM Filed Vitals:   09/18/11 0106 09/18/11 0705 09/18/11 0810 09/18/11 1203  BP: 109/47 134/51 152/55 150/79  Pulse: 47 44 42 50  Temp:  98 F (36.7 C) 97.8 F (36.6 C) 98 F (36.7 C)  TempSrc:  Oral Oral Oral  Resp: 14 18 15 13   Height:      Weight:  173 lb 1 oz (78.5 kg)    SpO2: 98% 98% 100% 96%    GEN- The patient is well appearing, alert and oriented x 3 today.   Head- normocephalic, atraumatic Eyes-  Sclera clear, conjunctiva pink Ears- hearing intact Oropharynx- clear Neck- supple, no JVP Lymph- no cervical lymphadenopathy Lungs- Clear to ausculation bilaterally, normal work of breathing Heart- Regular rate and rhythm, no murmurs, rubs or gallops, PMI not laterally displaced GI- soft, NT, ND, + BS Extremities- no clubbing, cyanosis, or edema Pacemaker site without hematoma  DEVICE INTERROGATION: Device interrogated by industry last night.  No atrial sensing or capture.  Device programmed VVI 40 I would therefore recommend pacemaker system revision at this time.  Risks, benefits, alternatives to the procedure were discussed in detail with the patient and her daughter today. The patient understands that the risks include but are not limited to bleeding, infection, pneumothorax, perforation, tamponade, vascular damage, renal failure, MI, stroke, death,  and lead dislodgement and wishes to proceed. We will therefore proceed with lead revision today.  Inga Noller,MD 12:20 PM 09/18/2011

## 2011-09-18 NOTE — Brief Op Note (Signed)
09/17/2011 - 09/18/2011  3:20 PM  PATIENT:  [name]  78 y.o. female  PRE-OPERATIVE DIAGNOSIS:  Atrial Lead dislodgment, tachycardia/ bradycardia syndrome  POST-OPERATIVE DIAGNOSIS:  Atrial Lead dislodgment, tachycardia/ bradycardia syndrome  PROCEDURE:  Procedure(s):  PACEMAKER REVISION with removal of old RA lead and placement of a new lead,  LUE venography  SURGEON:  Surgeon(s): Coralyn Mark, MD  PHYSICIAN ASSISTANT:   ASSISTANTS: none   ANESTHESIA:   local  EBL:  Total I/O In: -  Out: 350 [Urine:350]  BLOOD ADMINISTERED:none  DRAINS: none   LOCAL MEDICATIONS USED:  LIDOCAINE 5CC  SPECIMEN:  No Specimen  DISPOSITION OF SPECIMEN:  N/A  COUNTS:  YES  TOURNIQUET:  * No tourniquets in log *  DICTATION: .Other Dictation: Dictation Number [id number]  PLAN OF CARE: Admit for overnight observation  PATIENT DISPOSITION:  PACU - hemodynamically stable.   Delay start of Pharmacological VTE agent (>24hrs) due to surgical blood loss or risk of bleeding:  {YES/NO/NOT APPLICABLE:20182

## 2011-09-19 ENCOUNTER — Ambulatory Visit (HOSPITAL_COMMUNITY): Payer: No Typology Code available for payment source

## 2011-09-19 ENCOUNTER — Other Ambulatory Visit: Payer: Self-pay

## 2011-09-19 DIAGNOSIS — I498 Other specified cardiac arrhythmias: Secondary | ICD-10-CM

## 2011-09-19 DIAGNOSIS — I495 Sick sinus syndrome: Secondary | ICD-10-CM

## 2011-09-19 HISTORY — DX: Sick sinus syndrome: I49.5

## 2011-09-19 LAB — PROTIME-INR
INR: 2.52 — ABNORMAL HIGH (ref 0.00–1.49)
Prothrombin Time: 27.6 seconds — ABNORMAL HIGH (ref 11.6–15.2)

## 2011-09-19 MED ORDER — CEPHALEXIN 500 MG PO CAPS: 500.0000 mg | ORAL_CAPSULE | Freq: Two times a day (BID) | ORAL | Status: DC

## 2011-09-19 MED ORDER — POTASSIUM CHLORIDE CRYS ER 20 MEQ PO TBCR: 20.0000 meq | EXTENDED_RELEASE_TABLET | Freq: Every day | ORAL | Status: DC

## 2011-09-19 NOTE — Progress Notes (Signed)
Pt. Discharged 09/19/2011  12:00 PM Discharge instructions reviewed with patient/family. Patient/family verbalized understanding. All Rx's given. Questions answered as needed. Pt. Discharged to home with family/self. Taken off unit via W/C. Theresia Lo rn

## 2011-09-19 NOTE — Op Note (Signed)
Kathleen, Salinas               ACCOUNT NO.:  [id number]  MEDICAL RECORD NO.:  [id number]  LOCATION:  2027                         FACILITY:  [facility]  PHYSICIAN:  Thompson Grayer, MD       DATE OF BIRTH:  1923/05/03  DATE OF PROCEDURE:  09/17/2011 DATE OF DISCHARGE:                              OPERATIVE REPORT   SURGEON:  Thompson Grayer, MD  PREPROCEDURE DIAGNOSES: 1. Tachycardia-bradycardia syndrome. 2. Right atrial pacemaker lead dislodgement.  POSTPROCEDURE DIAGNOSIS: 1. Tachycardia-bradycardia syndrome. 2. Right atrial pacemaker lead dislodgement.  PROCEDURES: 1. Left upper extremity venography. 2. Pacemaker system revision with removal of the previously implanted     right atrial lead and placement of a new right atrial lead.  INTRODUCTION:  Ms. [name] is a pleasant 78 year old female with a history of atrial fibrillation and tachycardia-bradycardia syndrome. She was evaluated by Dr. Caryl Comes and pacemaker implantation was felt to be necessary.  She therefore underwent pacemaker implantation by Dr. Caryl Comes on September 17, 2011.  This was an uneventful procedure.  The patient did well initially following pacemaker implantation.  Unfortunately, she subsequently developed diaphragmatic stimulation.  A chest x-ray was obtained which revealed that her right atrial lead had in fact dislodged.  Interrogation of her device revealed inadequate sensing and pacing from the right atrial lead.  She therefore presents today for pacemaker system revision.  DESCRIPTION OF PROCEDURE:  Informed written consent was obtained and the patient was brought to the electrophysiology lab in the fasting state. She required no sedation for the procedure today.  Her left upper chest was prepped and draped in the usual sterile fashion by the EP lab staff. The skin overlying her existing pacemaker pocket was infiltrated with lidocaine for local analgesia.  The previously made incision by Dr. Caryl Comes was  carefully opened using a combination of sharp and blunt dissection.  All visible suture was identified and removed.  The pocket was exposed using a combination of sharp and blunt dissection.  There was a significant amount of Surgicel within the pocket and this was carefully removed.  The pacemaker was removed from the pocket and disconnected from the leads.  Fluoroscopy of the right atrial and right ventricular leads confirmed dislodgement of the right atrial lead.  The right ventricular lead appeared to be in a adequate position.  Both leads were disconnected from the device and the device was removed from the body.  The suture sleeve over the atrial lead was freed from the pocket.  A stylet was placed through the atrial lead and with fluoroscopic visualization, this lead was removed from the body.  As the patient had had lead dislodgement with a passive lead, I felt that placing an active fixation lead was an appropriate next step at this time.  A venogram of the left upper extremity was therefore performed which revealed a patent small left axillary vein which emptied into a small left subclavian vein.  The left axillary vein was therefore cannulated with fluoroscopic visualization.  Through the left axillary vein, a Medtronic model [phone] (serial [id number]) right atrial lead was advanced with fluoroscopic visualization into the right atrial appendage.  In this location, P-waves measured 3.1 mV with  an impedance of 612 ohms and a threshold of 0.5 V at 0.5 msec.  There was no diaphragmatic stimulation when pacing at 10 V.  A prominent injury current was observed.  The lead was therefore secured to the pectoralis fascia with 2 separate #2 silk sutures over the suture sleeve.  The right ventricular lead was confirmed to be a [place] (serial [id number]) lead implanted on September 17, 2011.  R-waves today measured 8.9 mV with an impedance of 639 ohms and a threshold of 0.2  V at 0.5 msec.  There was no diaphragmatic stimulation when pacing at 10 V from the right ventricular lead.  This lead was evaluated fluoroscopically and had suitable lead slack.  I therefore did not adjust the right ventricular lead today.  Both leads were connected to the previously implanted Medtronic Adapta L model ADDRL 1 (serial [id number]) pacemaker.  The pocket was irrigated with copious gentamicin solution.  The pacemaker was then placed into the pocket. The pocket was then closed in 2 layers with 2-0 Vicryl suture for the subcutaneous and subcuticular layers.  Steri-Strips and a sterile dressing were then applied.  There were no early apparent complications.  CONCLUSIONS: 1. Successful pacemaker system revision with removal of a previously     implanted passive right atrial lead and placement of a Medtronic     [phone] active fixation lead due to prior lead dislodgement. 2. No early apparent complications.     Thompson Grayer, MD     JA/MEDQ  D:  09/18/2011  T:  09/19/2011  Job:  [id number]

## 2011-09-19 NOTE — Discharge Summary (Signed)
Discharge Summary   Patient ID: [name],  MRN: [id number], DOB/AGE: 76/22/1924 76 y.o.  Admit date: 09/17/2011 Discharge date: 09/19/2011  Discharge Diagnoses Principal Problem:  *Tachycardia-bradycardia syndrome Active Problems:  Campath-induced atrial fibrillation  RENAL INSUFFICIENCY   Allergies No Known Allergies  Procedures  OPERATIVE REPORT  PREOPERATIVE DIAGNOSIS: Tachy-brady syndrome, atrial fibrillation with  junctional bradycardia.  POSTOPERATIVE DIAGNOSIS: Tachy-brady syndrome, atrial fibrillation with  junctional bradycardia.  PROCEDURE: Dual-chamber pacemaker implantation.  SURGEON: Deboraha Sprang, MD, [facility]  DESCRIPTION OF PROCEDURE: Following obtained informed consent, the  patient was brought to the electrophysiology laboratory and placed on  the fluoroscopic table in supine position. After routine prep and drape  of the left upper chest, lidocaine was infiltrated in the prepectoral  subclavicular region. An incision was made and carried down to the  layer of the prepectoral fascia with electrocautery and sharp  dissection. A pocket was formed similarly. Hemostasis was obtained.  Thereafter attention was turned to gain access to the extrathoracic left  subclavian vein which was accomplished without difficulty without the  aspiration of air. However, there was an interval for about 10 minutes  while fluoro did work. Two separate venipunctures were obtained.  Guidewires were placed and retained. Sequentially, 7-French sheaths  were placed through which we passed a [place], 52-cm passive  fixation ventricular lead, serial [id number] and a St. Jude 1944 passive  fixation atrial lead, serial [id number]. Under fluoroscopic guidance,  these were manipulated at the right ventricular apex and right atrial  appendage respectively where bipolar R-wave was 8.4, pace impedance of  847 with threshold 0.3 at 0.5. Current of threshold was 0.4 mA. There  was no  diaphragmatic pacing at 10 V.  Bipolar P-wave was 3.2 with a pace impedance of 475 with a threshold of  0.4 at 0.5. There was no diaphragmatic pacing at 10 V. The current of  threshold was 0.9 mA. With these acceptable parameters recorded, the  leads were secured to the prepectoral fascia and then attached to  Medtronic Adapta L pulse generator, serial number [id number].  Ventricular pacing and atrial pacing were identified. The pocket was  copiously irrigated with antibiotic containing saline solution.  Hemostasis was assured. Leads and pulse generator were placed and the  pocket secured to the prepectoral fascia and Surgicel was used  anteriorly, posteriorly, cephalad, and laterally as the  patient's INR was 2.7. The pocket was closed in 3 layers in normal  fashion. The wound was washed, dried, and a benzoin and Steri-Strip  dressing was applied. Needle counts, sponge counts, and instrument  counts were correct at the end of the procedure according to the staff.  The patient tolerated the procedure without apparent complication.   PREPROCEDURE DIAGNOSES:  1. Tachycardia-bradycardia syndrome.  2. Right atrial pacemaker lead dislodgement.  POSTPROCEDURE DIAGNOSIS:  1. Tachycardia-bradycardia syndrome.  2. Right atrial pacemaker lead dislodgement.  SURGEON: Dr. Thompson Grayer  PROCEDURES:  1. Left upper extremity venography.  2. Pacemaker system revision with removal of the previously implanted  right atrial lead and placement of a new right atrial lead.  INTRODUCTION: [name] is a pleasant 78 year old female with a  history of atrial fibrillation and tachycardia-bradycardia syndrome.  She was evaluated by Dr. Caryl Comes and pacemaker implantation was felt to be  necessary. She therefore underwent pacemaker implantation by Dr. Caryl Comes  on September 17, 2011. This was an uneventful procedure. The patient did  well initially following pacemaker implantation. Unfortunately, she  subsequently  developed diaphragmatic  stimulation. A chest x-ray was  obtained which revealed that her right atrial lead had in fact  dislodged. Interrogation of her device revealed inadequate sensing and  pacing from the right atrial lead. She therefore presents today for  pacemaker system revision.  DESCRIPTION OF PROCEDURE: Informed written consent was obtained and the  patient was brought to the electrophysiology lab in the fasting state.  She required no sedation for the procedure today. Her left upper chest  was prepped and draped in the usual sterile fashion by the EP lab staff.  The skin overlying her existing pacemaker pocket was infiltrated with  lidocaine for local analgesia. The previously made incision by Dr.  Caryl Comes was carefully opened using a combination of sharp and blunt  dissection. All visible suture was identified and removed. The pocket  was exposed using a combination of sharp and blunt dissection. There  was a significant amount of Surgicel within the pocket and this was  carefully removed. The pacemaker was removed from the pocket and  disconnected from the leads. Fluoroscopy of the right atrial and right  ventricular leads confirmed dislodgement of the right atrial lead. The  right ventricular lead appeared to be in a adequate position. Both  leads were disconnected from the device and the device was removed from  the body. The suture sleeve over the atrial lead was freed from the  pocket. A stylet was placed through the atrial lead and with  fluoroscopic visualization, this lead was removed from the body. As the  patient had had lead dislodgement with a passive lead, I felt that  placing an active fixation lead was an appropriate next step at this  time. A venogram of the left upper extremity was therefore performed  which revealed a patent small left axillary vein which emptied into a  small left subclavian vein. The left axillary vein was therefore  cannulated with  fluoroscopic visualization. Through the left axillary  vein, a Medtronic model [id number] (serial [id number]) right atrial lead  was advanced with fluoroscopic visualization into the right atrial  appendage. In this location, P-waves measured 3.1 mV with an impedance  of 612 ohms and a threshold of 0.5 V at 0.5 msec. There was no  diaphragmatic stimulation when pacing at 10 V. A prominent injury  current was observed. The lead was therefore secured to the pectoralis  fascia with 2 separate #2 silk sutures over the suture sleeve. The  right ventricular lead was confirmed to be a [place]  (serial [id number]) lead implanted on September 17, 2011. R-waves today  measured 8.9 mV with an impedance of 639 ohms and a threshold of 0.2 V  at 0.5 msec. There was no diaphragmatic stimulation when pacing at 10 V  from the right ventricular lead. This lead was evaluated  fluoroscopically and had suitable lead slack. I therefore did not  adjust the right ventricular lead today. Both leads were connected to  the previously implanted Medtronic Adapta L model ADDRL 1 (serial  [id number]) pacemaker. The pocket was irrigated with copious  gentamicin solution. The pacemaker was then placed into the pocket.  The pocket was then closed in 2 layers with 2-0 Vicryl suture for the  subcutaneous and subcuticular layers. Steri-Strips and a sterile  dressing were then applied. There were no early apparent complications.  CONCLUSIONS:  1. Successful pacemaker system revision with removal of a previously  implanted passive right atrial lead and placement of a Medtronic  [id number] active fixation lead  due to prior lead dislodgement.  2. No early apparent complications.  History of Present Illness  [name] is a 78 yo female with PMHx significant for campath-induced atrial fibrillation (on Amiodarone and Coumadin), tachy-brady syndrome and orthodromic reciprocating tachycardia who was admitted to [facility]  hospital for elective PPM implantation.   She had followed-up at [facility] outpatient clinic after a recent hospitalization at [facility] for atrial fib with a rapid rate. At the time of that hospitalization she had cardioversion. Since then she has been on amiodarone. Because she had severe symptomatic bradycardia on amiodarone and beta blockers, in the past Dr. Percival Spanish discontinued the beta blocker and reduced the amiodarone. BP diary per the patient demonstrates blood pressure to be well controlled. Heart rates are typically in the high to mid 40s. She was set to undergo scheduled pacemaker placed for tachybradycardia syndrome and has seen Dr. Caryl Comes. She denied any chest pressure, neck or arm discomfort. She had no shortness of breath, PND or orthopnea.  Hospital Course   She was stable on admission. There was a question as to whether she should take her regularly scheduled Coumadin dose the evening prior to her procedure. She took the dose, and was advised to consume greens that night. She was mildly hypokalemic which was supplemented.   She underwent her initial procedure performed by Dr. Caryl Comes the following day, tolerated it well without apparent complication. Shortly after, she developed an "unusual tremor" affecting her right lateral abdomen. Dr. Caryl Comes was informed who believed this was likely diaphragmatic stimulation from the recently implanted RV lead via perforation vs RA lead dislodgement. Device interrogation and CXR were ordered. It was determined that she had a dislodged atrial lead, and was set to undergo revision the following morning. She was found to be hypokalemic, which was once again repleted successfully.   She was stable and underwent lead revision the following day by Dr. Rayann Heman without complications. She tolerated the procedure well. Today she is doing well, mentioning some residual weakness. Repeat CXR and device interrogation revealed stable leads and normal PM function,  respectively.   She will ambulate this morning with discharge if stable. She will be discharged on prophylactic Keflex x 5 days with scheduled wound care and EP follow-up with Dr. Rayann Heman. She will be discharged on daily KCl as she is on Lasix outpatient with episodes of hypokalemia this admission. Otherwise, no changes made to outpatient medications.   Discharge Vitals:  Blood pressure 148/80, pulse 61, temperature 98 F (36.7 C), temperature source Oral, resp. rate 19, height 5\' 2"  (1.575 m), weight 78.5 kg (173 lb 1 oz), SpO2 98.00%.   Labs:  Lab 09/18/11 0423 09/17/11 0602  NA 144 145  K 4.0 2.8*  CL 104 98  CO2 34* 33*  BUN 22 33*  CREATININE 1.33* 1.41*  CALCIUM 9.4 9.8  PROT -- --  BILITOT -- --  ALKPHOS -- --  ALT -- --  AST -- --  AMYLASE -- --  LIPASE -- --  GLUCOSE 101* 105*   Disposition:  Discharge Orders    Future Appointments: Provider: Department: Dept Phone: Center:   10/27/2011 11:00 AM Minus Breeding, [place] [phone] [id number]     Follow-up Information    Follow up with [facility] today. (Will call for for wound care appointment. Usually about 1 week after discharge.)    Contact information:   [place] [phone]       Follow up with  Thompson Grayer, MD in 12 weeks. (Will call with appointment. )    Contact information:   [address] [place] [phone]          Discharge Medications:  Medication List  As of 09/19/2011  9:51 AM   START taking these medications         cephALEXin 500 MG capsule   Commonly known as: KEFLEX   Take 1 capsule (500 mg total) by mouth 2 (two) times daily.      potassium chloride SA 20 MEQ tablet   Commonly known as: K-DUR,KLOR-CON   Take 1 tablet (20 mEq total) by mouth daily.         CONTINUE taking these medications         amiodarone 200 MG tablet   Commonly known as: PACERONE   Take 1 tablet (200  mg total) by mouth daily.      atorvastatin 10 MG tablet   Commonly known as: LIPITOR      furosemide 80 MG tablet   Commonly known as: LASIX      lisinopril 5 MG tablet   Commonly known as: PRINIVIL,ZESTRIL      MELATONIN PO      multivitamin tablet      PATANOL OP      travoprost (benzalkonium) 0.004 % ophthalmic solution   Commonly known as: TRAVATAN      warfarin 2 MG tablet   Commonly known as: COUMADIN          Where to get your medications    These are the prescriptions that you need to pick up.   You may get these medications from any pharmacy.         cephALEXin 500 MG capsule   potassium chloride SA 20 MEQ tablet             Outstanding Labs/Studies: None  Duration of Discharge Encounter: 40 minutes including physician time.  Signed, R. Valeria Batman, PA-C 09/19/2011, 9:51 AM  I have seen, examined the patient, and reviewed the above assessment and plan.   Co Sign: Thompson Grayer, MD 09/20/2011 9:14 AM

## 2011-09-19 NOTE — Progress Notes (Signed)
SUBJECTIVE: The patient is doing well today.  At this time, she denies chest pain, shortness of breath, or any new concerns. She is mildly weak after being in bed for 2 days.    Marland Kitchen amiodarone  200 mg Oral Daily  . ceFAZolin (ANCEF) IV  1 g Intravenous Q6H  . lidocaine      . lisinopril  5 mg Oral Daily  . mupirocin ointment   Nasal Once  . olopatadine  1 drop Both Eyes BID  . potassium chloride  20 mEq Oral Daily  . simvastatin  20 mg Oral Daily  . sodium chloride  3 mL Intravenous Q12H  . Travoprost (BAK Free)  1 drop Both Eyes QHS  . warfarin  2 mg Oral Custom  . warfarin  3 mg Oral Custom  . DISCONTD: acetaminophen  650 mg Oral Q6H  . DISCONTD: ceFAZolin (ANCEF) IV  1 g Intravenous On Call  . DISCONTD: furosemide  80 mg Oral Daily  . DISCONTD: gentamicin irrigation  80 mg Irrigation On Call  . DISCONTD: sodium chloride  3 mL Intravenous Q12H      . DISCONTD: sodium chloride 50 mL/hr at 09/18/11 0645  . DISCONTD: sodium chloride 50 mL/hr at 09/18/11 0642    OBJECTIVE: Physical Exam: Filed Vitals:   09/18/11 1640 09/18/11 1830 09/18/11 2155 09/19/11 0449  BP: 144/67 122/70 104/66 148/80  Pulse: 61 68 62 61  Temp: 98.6 F (37 C) 97.8 F (36.6 C) 97.7 F (36.5 C) 98 F (36.7 C)  TempSrc: Oral Oral Oral Oral  Resp: 19 18 18 19   Height:      Weight:      SpO2: 96% 97% 95% 98%    Intake/Output Summary (Last 24 hours) at 09/19/11 [id number] Last data filed at 09/19/11 0600  Gross per 24 hour  Intake    390 ml  Output    550 ml  Net   -160 ml    Telemetry reveals sinus rhythm  GEN- The patient is well appearing, alert and oriented x 3 today.   Head- normocephalic, atraumatic Eyes-  Sclera clear, conjunctiva pink Ears- hearing intact Oropharynx- clear Neck- supple, no JVP Lymph- no cervical lymphadenopathy Lungs- Clear to ausculation bilaterally, normal work of breathing Heart- Regular rate and rhythm, no murmurs, rubs or gallops, PMI not laterally displaced GI-  soft, NT, ND, + BS Extremities- no clubbing, cyanosis, or edema Pacemaker site is healing nicely without hematoma  LABS: Basic Metabolic Panel:  Basename 09/18/11 0423 09/17/11 0602  NA 144 145  K 4.0 2.8*  CL 104 98  CO2 34* 33*  GLUCOSE 101* 105*  BUN 22 33*  CREATININE 1.33* 1.41*  CALCIUM 9.4 9.8  MG -- --  PHOS -- --   ASSESSMENT AND PLAN:  Active Problems:  Campath-induced atrial fibrillation  BRADYCARDIA  RENAL INSUFFICIENCY  1. Tachy/brady syndrome- doing well s/p lead revision yesterday CXR this am reveals stable leads Normal pacemaker function on interrogation today No changes today  2. Afib- stable Continue coumadin  Will ambulate and plan discharge later today if stable Routine wound care and follow-up  Would treat prophylacticaly with keflex x 5 days s/p PPM   Thompson Grayer, MD 09/19/2011 8:38 AM

## 2011-09-21 ENCOUNTER — Telehealth: Payer: Self-pay | Admitting: Cardiology

## 2011-09-21 NOTE — Telephone Encounter (Signed)
I have been unable to determine who called the pt.  Daughter wants to let Dr Percival Spanish know that pt's BP is 147/88 and she feels fine.

## 2011-09-21 NOTE — Telephone Encounter (Signed)
Fu call °Pt returning your call  °

## 2011-09-24 ENCOUNTER — Ambulatory Visit: Payer: No Typology Code available for payment source | Admitting: *Deleted

## 2011-09-25 ENCOUNTER — Encounter: Payer: Self-pay | Admitting: Internal Medicine

## 2011-09-25 ENCOUNTER — Ambulatory Visit (INDEPENDENT_AMBULATORY_CARE_PROVIDER_SITE_OTHER): Payer: No Typology Code available for payment source | Admitting: *Deleted

## 2011-09-25 ENCOUNTER — Ambulatory Visit (INDEPENDENT_AMBULATORY_CARE_PROVIDER_SITE_OTHER): Payer: Medicaid Other | Admitting: *Deleted

## 2011-09-25 DIAGNOSIS — I4891 Unspecified atrial fibrillation: Secondary | ICD-10-CM

## 2011-09-25 DIAGNOSIS — I495 Sick sinus syndrome: Secondary | ICD-10-CM

## 2011-09-25 LAB — POCT INR: INR: 1.6

## 2011-09-25 NOTE — Progress Notes (Signed)
Wound check pacer in clinic  

## 2011-09-26 ENCOUNTER — Telehealth: Payer: Self-pay | Admitting: Adult Health

## 2011-09-26 NOTE — Telephone Encounter (Signed)
Received call from patient's daughter, [name] because of elevated diastolic BP. The patient's BP was 136/98 and 134/ 102.  She asked if she should worry about the BP elevation on the lower number. I have asked her to allow her mother to rest. Check BP again. No changes in medications at this time.  Jory Sims NP

## 2011-09-28 LAB — PACEMAKER DEVICE OBSERVATION
AL AMPLITUDE: 1 mv
AL IMPEDENCE PM: 484 Ohm
AL THRESHOLD: 1 V
ATRIAL PACING PM: 87
BAMS-0001: 150 {beats}/min
BATTERY VOLTAGE: 2.79 V
RV LEAD AMPLITUDE: 15.68 mv
RV LEAD IMPEDENCE PM: 767 Ohm
RV LEAD THRESHOLD: 0.5 V
VENTRICULAR PACING PM: 0

## 2011-09-28 NOTE — Telephone Encounter (Signed)
I would prefer she not start Norvasc back at this point.  I will review this with her when I see her in the hospital.

## 2011-09-28 NOTE — Telephone Encounter (Signed)
I talked with pt's daughter,[name].  Pt in hospital 09/18/11 for pacemaker. Pt has not been on Norvasc 10mg   since October 29,2012 when she was in the hospital in [place].  Pt's daughter states pt's BP has been gradually creeping up. Recent BP readings--- 09/20/11 137/83  09/21/11 147/88   125/78  09/22/11 128/86  09/23/11 125 /72 09/24/11 119/89 112/88 09/25/11 134/81 09/26/11 133/92 131/103 09/27/11 125/87 09/28/11. Pt's daughter asking if pt should restart Norvasc based on these readings. I will forward to Dr Percival Spanish for review.

## 2011-09-28 NOTE — Telephone Encounter (Signed)
Pt's dtr calling back re msg this am, the msg was sent to pam and she is not here today, her BP 131/82, wants to know if she can take norvasc has some left after was taken off after last hospital visit, if ok will need refill [address], pls call

## 2011-09-28 NOTE — Telephone Encounter (Signed)
New Problem  Patient's daughter would like a return call concerning patient's b/p which has continually gone up over the weekend.  Please return call to Darryll Capers [phone]

## 2011-09-28 NOTE — Telephone Encounter (Signed)
I will forward to Dr Percival Spanish for review

## 2011-09-29 NOTE — Telephone Encounter (Signed)
PT'S DAUGHTER AWARE NOT TO RESUME NORVASC  PER DR HOCHREIN .WILL FORWARD AGAIN TO DR  HOCHREIN  PER DAUGHTER PT IS NOT GOING TO HOSPITAL ANY TIME SOON  HAS F/U IN FEB  TO SEE YOU .[name]

## 2011-09-29 NOTE — Telephone Encounter (Signed)
Dr hochrein spoke with pts dtr. He will discuss norvasc at next office visit

## 2011-10-01 ENCOUNTER — Ambulatory Visit (INDEPENDENT_AMBULATORY_CARE_PROVIDER_SITE_OTHER): Payer: Medicaid Other | Admitting: *Deleted

## 2011-10-01 ENCOUNTER — Telehealth: Payer: Self-pay | Admitting: *Deleted

## 2011-10-01 DIAGNOSIS — I4891 Unspecified atrial fibrillation: Secondary | ICD-10-CM

## 2011-10-01 LAB — POCT INR: INR: 2

## 2011-10-01 NOTE — Telephone Encounter (Signed)
Per daughter Darryll Capers, pt needs a letter stating she needs an aide with her during the day for assistance of ADLs.  Please call her when it is completed and she will pick up.

## 2011-10-06 NOTE — Telephone Encounter (Signed)
OK 

## 2011-10-06 NOTE — Telephone Encounter (Signed)
Left message to call back  

## 2011-10-06 NOTE — Telephone Encounter (Signed)
FU Call: Pt daughter calling wanting to see if there is any other BP medication, other than norvasc MD would approve for pt to take. Please return pt daughter call to discuss further.

## 2011-10-07 ENCOUNTER — Telehealth: Payer: Self-pay | Admitting: Cardiology

## 2011-10-07 NOTE — Telephone Encounter (Signed)
Left message for [name] to call back.

## 2011-10-07 NOTE — Telephone Encounter (Signed)
New problem Pt's daughter wanted to talk to you about mom's blood pressure it has been running 140/90  please call her back

## 2011-10-07 NOTE — Telephone Encounter (Signed)
Please see last phone note.

## 2011-10-12 ENCOUNTER — Encounter: Payer: Self-pay | Admitting: *Deleted

## 2011-10-12 NOTE — Telephone Encounter (Signed)
Fu call Pt's daughter was calling about BP med and about getting some aid for her mother please call her back

## 2011-10-12 NOTE — Telephone Encounter (Signed)
Letter mailed to pt indicating pt's need for assistance.

## 2011-10-13 ENCOUNTER — Telehealth: Payer: Self-pay | Admitting: Internal Medicine

## 2011-10-13 NOTE — Telephone Encounter (Signed)
I spoke with the patient. She has had a HR of 80 today. I explained that if that is just one time event for her, that is ok. I advised for her to call us back should she have sustained HR's above 80 bpm. She is agreeable.

## 2011-10-13 NOTE — Telephone Encounter (Signed)
New Problem:     Patient called in wondering if her heart rate of 80 was too high. Please call back.

## 2011-10-14 ENCOUNTER — Other Ambulatory Visit: Payer: Self-pay | Admitting: Cardiology

## 2011-10-14 ENCOUNTER — Other Ambulatory Visit: Payer: Self-pay | Admitting: *Deleted

## 2011-10-14 MED ORDER — WARFARIN SODIUM 2 MG PO TABS: 2.0000 mg | ORAL_TABLET | ORAL | Status: DC

## 2011-10-15 MED ORDER — FUROSEMIDE 80 MG PO TABS: 80.0000 mg | ORAL_TABLET | Freq: Every day | ORAL | Status: DC

## 2011-10-15 MED ORDER — OLOPATADINE HCL 0.1 % OP SOLN: 1.0000 [drp] | Freq: Every day | OPHTHALMIC | Status: AC

## 2011-10-15 MED ORDER — TRAVOPROST 0.004 % OP SOLN: 1.0000 [drp] | Freq: Every day | OPHTHALMIC | Status: DC

## 2011-10-15 MED ORDER — ATORVASTATIN CALCIUM 10 MG PO TABS: 10.0000 mg | ORAL_TABLET | Freq: Every day | ORAL | Status: DC

## 2011-10-15 MED ORDER — LISINOPRIL 5 MG PO TABS: 5.0000 mg | ORAL_TABLET | Freq: Every day | ORAL | Status: DC

## 2011-10-26 ENCOUNTER — Telehealth: Payer: Self-pay | Admitting: Cardiology

## 2011-10-26 NOTE — Telephone Encounter (Signed)
Will forward to Dr Percival Spanish for parameters for BP and wts

## 2011-10-26 NOTE — Telephone Encounter (Signed)
New Problem   Lyndee Hensen [facility] , [phone]  Calling to report she is providing care for patient, family wants BP & weight taken every day but she needs orders and parameters.  Please return call to [place]

## 2011-10-27 ENCOUNTER — Ambulatory Visit (INDEPENDENT_AMBULATORY_CARE_PROVIDER_SITE_OTHER): Payer: No Typology Code available for payment source | Admitting: *Deleted

## 2011-10-27 ENCOUNTER — Ambulatory Visit (INDEPENDENT_AMBULATORY_CARE_PROVIDER_SITE_OTHER): Payer: No Typology Code available for payment source | Admitting: Cardiology

## 2011-10-27 ENCOUNTER — Encounter: Payer: Self-pay | Admitting: Cardiology

## 2011-10-27 DIAGNOSIS — I10 Essential (primary) hypertension: Secondary | ICD-10-CM

## 2011-10-27 DIAGNOSIS — I428 Other cardiomyopathies: Secondary | ICD-10-CM

## 2011-10-27 DIAGNOSIS — I4891 Unspecified atrial fibrillation: Secondary | ICD-10-CM

## 2011-10-27 DIAGNOSIS — I495 Sick sinus syndrome: Secondary | ICD-10-CM

## 2011-10-27 LAB — POCT INR: INR: 2.9

## 2011-10-27 NOTE — Assessment & Plan Note (Signed)
She is doing well. She will continue with current therapies.

## 2011-10-27 NOTE — Progress Notes (Signed)
HPI The patient presents after followup for placement of a pacemaker. She had tachybradycardia syndrome. Pacemaker was placed though she had had lead revision. She otherwise has done well. Home health nursing is taking her blood pressures and occasionally her diastolics will be elevated. However, for the most part her blood pressure is well controlled. She denies chest pressure, neck or arm discomfort. She's had no palpitations, presyncope or syncope. She's not having any PND or orthopnea. She's had no problems with her surgical incision.   No Known Allergies  Current Outpatient Prescriptions  Medication Sig Dispense Refill  . amiodarone (PACERONE) 200 MG tablet Take 1 tablet (200 mg total) by mouth daily.  30 tablet  11  . atorvastatin (LIPITOR) 10 MG tablet Take 1 tablet (10 mg total) by mouth daily.  30 tablet  2  . furosemide (LASIX) 80 MG tablet Take 1 tablet (80 mg total) by mouth daily.  30 tablet  2  . lisinopril (PRINIVIL,ZESTRIL) 5 MG tablet Take 1 tablet (5 mg total) by mouth daily.  30 tablet  2  . Multiple Vitamin (MULTIVITAMIN) tablet Take 1 tablet by mouth daily.        . Nutritional Supplements (MELATONIN PO) Take 1 capsule by mouth at bedtime as needed. For sleep.       Marland Kitchen olopatadine (PATANOL) 0.1 % ophthalmic solution Place 1 drop into both eyes daily.  5 mL  2  . potassium chloride SA (K-DUR,KLOR-CON) 20 MEQ tablet Take 1 tablet (20 mEq total) by mouth daily.  30 tablet  3  . travoprost, benzalkonium, (TRAVATAN) 0.004 % ophthalmic solution Place 1 drop into both eyes at bedtime.  2.5 mL  2  . warfarin (COUMADIN) 2 MG tablet Take 1 tablet (2 mg total) by mouth as directed.  40 tablet  3    Past Medical History  Diagnosis Date  . Malleolar fracture   . Hypertension   . Dementia     mild  . Dyslipidemia   . Glaucoma   . Anemia   . Cardiomyopathy     presumed ischemic in the past with an EF of 35%. The most recent EF in 03-2008, however, was 55 -60%  . Chronic renal  insufficiency   . Bradycardia   . Paroxysmal atrial fibrillation   . Arthritis     "knees especially"    Past Surgical History  Procedure Date  . Hemorrhoid surgery   . Insert / replace / remove pacemaker 09/17/11    initial placement  . Insert / replace / remove pacemaker 09/18/11  . Abdominal hysterectomy   . Tonsillectomy and adenoidectomy   . Fracture surgery ~ 2007    RLE  . Cardioversion 07/2011  . Eye surgery 2002    "blood clot behind eye"    ROS: As stated in the HPI and negative for all other systems.  PHYSICAL EXAM BP 150/90  Pulse 72  Ht 5\' 3"  (1.6 m)  Wt 167 lb (75.751 kg)  BMI 29.58 kg/m2 GEN:  No distress NECK:  No jugular venous distention at 45 degrees, waveform within normal limits, carotid upstroke brisk and symmetric, no bruits, no thyromegaly LYMPHATICS:  No cervical adenopathy LUNGS:  Clear to auscultation bilaterally BACK:  No CVA tenderness CHEST:  Well healed pacemaker pocket HEART:  S1 and S2 within normal limits, no S3, no clicks, no rubs, soft apical systolic murmur ABD:  Positive bowel sounds normal in frequency in pitch, no bruits, no rebound, no guarding, no midline pulsatile  mass or hepatomeally EXT:  2 plus pulses throughout, mild edema, no cyanosis no clubbing SKIN:  No rashes no nodules NEURO:  Cranial nerves II through XII grossly intact, motor grossly intact throughout PSYCH:  Cognitively intact, oriented to person place and time, pleasantly confused  ASSESSMENT AND PLAN

## 2011-10-27 NOTE — Patient Instructions (Signed)
Your physician recommends that you schedule a follow-up appointment in: 6 WEEKS

## 2011-10-27 NOTE — Assessment & Plan Note (Signed)
For the most part her blood pressures are well controlled. She will continue on the meds as listed.

## 2011-10-27 NOTE — Assessment & Plan Note (Signed)
She seems to be euvolemic.  At this point, no change in therapy is indicated.  We have reviewed salt and fluid restrictions.  No further cardiovascular testing is indicated.   

## 2011-11-02 ENCOUNTER — Telehealth: Payer: Self-pay | Admitting: Cardiology

## 2011-11-02 NOTE — Telephone Encounter (Signed)
Left message for pt to call back  °

## 2011-11-02 NOTE — Telephone Encounter (Signed)
Faxed Dr. Rosezella [place] office note from 10/27/2011 on 11/02/2011 emg

## 2011-11-02 NOTE — Telephone Encounter (Signed)
Per pt BP yesterday was 149/101 and 130/90.  Today it is 129/88.  Pt reports taking medications as listed.  She will keep a log of BP and call back if it continues to be elevated.

## 2011-11-02 NOTE — Telephone Encounter (Signed)
F/U   Patient returning nurse PF call, she can be reached at hm#

## 2011-11-02 NOTE — Telephone Encounter (Signed)
New msg Pt wants to talk to you about her BP. Please call her back

## 2011-11-03 NOTE — Telephone Encounter (Signed)
Will forward to MD for recommendations

## 2011-11-03 NOTE — Telephone Encounter (Signed)
FU Call: Case manager calling wanting to get parameters for BP, HR and wts. Please return call to discuss further.

## 2011-11-06 NOTE — Telephone Encounter (Signed)
There are no parameters.  They just need to gather the information for [place].  They do not needstanding orders.

## 2011-11-09 NOTE — Telephone Encounter (Signed)
Left message on voicemail for [facility] Manuela Schwartz of Dr Hochrein's orders

## 2011-11-23 ENCOUNTER — Telehealth: Payer: Self-pay | Admitting: Cardiology

## 2011-11-23 NOTE — Telephone Encounter (Addendum)
BP 149/105 reading this am, pls advise, pt [phone]

## 2011-11-23 NOTE — Telephone Encounter (Signed)
Higher bp was prior to bp med.  Lower reading was 1 1/2 hours after taking bp med.  I explained to her that taking her bp med is supposed to bring her bp down to a normal level which it did.  I told her to call us back if her bp stays up after taking her bp meds.

## 2011-11-23 NOTE — Telephone Encounter (Signed)
I spoke with the patient. She states that her BP was 149/105 this morning. She states this is not usual for her. In speaking with her, she did take this reading before taking her BP medication. She states she is getting ready to take her medication now. I have advised her to wait about an hour and recheck her reading again. If it is still elevated, she will call us back.

## 2011-11-23 NOTE — Telephone Encounter (Signed)
New msg Pt called and said her BP this morning was 149/105 and after she took med it was 126/82. Please call her back

## 2011-11-24 ENCOUNTER — Ambulatory Visit (INDEPENDENT_AMBULATORY_CARE_PROVIDER_SITE_OTHER): Payer: No Typology Code available for payment source | Admitting: Pharmacist

## 2011-11-24 DIAGNOSIS — I4891 Unspecified atrial fibrillation: Secondary | ICD-10-CM

## 2011-11-24 LAB — POCT INR: INR: 1.5

## 2011-12-07 ENCOUNTER — Ambulatory Visit (INDEPENDENT_AMBULATORY_CARE_PROVIDER_SITE_OTHER): Payer: No Typology Code available for payment source | Admitting: Pharmacist

## 2011-12-07 ENCOUNTER — Encounter: Payer: Self-pay | Admitting: Cardiology

## 2011-12-07 ENCOUNTER — Ambulatory Visit (INDEPENDENT_AMBULATORY_CARE_PROVIDER_SITE_OTHER): Payer: No Typology Code available for payment source | Admitting: Cardiology

## 2011-12-07 VITALS — BP 130/85 | HR 84 | Ht 63.0 in | Wt 170.0 lb

## 2011-12-07 DIAGNOSIS — Z79899 Other long term (current) drug therapy: Secondary | ICD-10-CM

## 2011-12-07 DIAGNOSIS — N259 Disorder resulting from impaired renal tubular function, unspecified: Secondary | ICD-10-CM

## 2011-12-07 DIAGNOSIS — I4891 Unspecified atrial fibrillation: Secondary | ICD-10-CM

## 2011-12-07 DIAGNOSIS — R0989 Other specified symptoms and signs involving the circulatory and respiratory systems: Secondary | ICD-10-CM

## 2011-12-07 LAB — HEPATIC FUNCTION PANEL
ALT: 17 U/L (ref 0–35)
AST: 19 U/L (ref 0–37)
Albumin: 4.3 g/dL (ref 3.5–5.2)
Alkaline Phosphatase: 46 U/L (ref 39–117)
Bilirubin, Direct: 0.2 mg/dL (ref 0.0–0.3)
Total Bilirubin: 0.9 mg/dL (ref 0.3–1.2)
Total Protein: 7.1 g/dL (ref 6.0–8.3)

## 2011-12-07 LAB — PROTIME-INR
INR: 1.5 ratio — ABNORMAL HIGH (ref 0.8–1.0)
Prothrombin Time: 16.4 s — ABNORMAL HIGH (ref 10.2–12.4)

## 2011-12-07 LAB — BASIC METABOLIC PANEL
BUN: 33 mg/dL — ABNORMAL HIGH (ref 6–23)
CO2: 33 mEq/L — ABNORMAL HIGH (ref 19–32)
Calcium: 9.9 mg/dL (ref 8.4–10.5)
Chloride: 102 mEq/L (ref 96–112)
Creatinine, Ser: 1.5 mg/dL — ABNORMAL HIGH (ref 0.4–1.2)
GFR: 42.96 mL/min — ABNORMAL LOW (ref >60.00)
Glucose, Bld: 86 mg/dL (ref 70–99)
Potassium: 4.5 mEq/L (ref 3.5–5.1)
Sodium: 143 mEq/L (ref 135–145)

## 2011-12-07 LAB — TSH: TSH: 2.7 u[IU]/mL (ref 0.35–5.50)

## 2011-12-07 MED ORDER — ATORVASTATIN CALCIUM 10 MG PO TABS: 10.0000 mg | ORAL_TABLET | Freq: Every day | ORAL | Status: DC

## 2011-12-07 MED ORDER — POTASSIUM CHLORIDE CRYS ER 20 MEQ PO TBCR: 20.0000 meq | EXTENDED_RELEASE_TABLET | Freq: Every day | ORAL | Status: DC

## 2011-12-07 MED ORDER — FUROSEMIDE 80 MG PO TABS: 80.0000 mg | ORAL_TABLET | Freq: Every day | ORAL | Status: DC

## 2011-12-07 NOTE — Patient Instructions (Signed)
Please have lab work today  The current medical regimen is effective;  continue present plan and medications.  Follow up in 6 months with Dr Percival Spanish.  You will receive a letter in the mail 2 months before you are due.  Please call us when you receive this letter to schedule your follow up appointment.

## 2011-12-07 NOTE — Assessment & Plan Note (Signed)
I reassured her today that her blood pressure is well controlled. She will continue the medications as listed.

## 2011-12-07 NOTE — Progress Notes (Signed)
HPI The patient presents after followup for placement of a pacemaker. She had tachybradycardia syndrome. Pacemaker was placed though she had had lead revision. She has done well though she becomes anxious about her blood pressure readings. I have reviewed the blood pressure diary today and her systolics are usually well controlled averaging in the 130s. Diastolics are for the most part in the 80s though they go up occasionally. Overall the trend is reasonably well-controlled. She's had no documented tachycardia palpitations. He said no presyncope or syncope. She denies chest pain or shortness of breath.   No Known Allergies  Current Outpatient Prescriptions  Medication Sig Dispense Refill  . amiodarone (PACERONE) 200 MG tablet Take 1 tablet (200 mg total) by mouth daily.  30 tablet  11  . atorvastatin (LIPITOR) 10 MG tablet Take 1 tablet (10 mg total) by mouth daily.  30 tablet  2  . furosemide (LASIX) 80 MG tablet Take 1 tablet (80 mg total) by mouth daily.  30 tablet  2  . lisinopril (PRINIVIL,ZESTRIL) 5 MG tablet Take 1 tablet (5 mg total) by mouth daily.  30 tablet  2  . Multiple Vitamin (MULTIVITAMIN) tablet Take 1 tablet by mouth daily.        . Nutritional Supplements (MELATONIN PO) Take 1 capsule by mouth at bedtime as needed. For sleep.       Marland Kitchen olopatadine (PATANOL) 0.1 % ophthalmic solution Place 1 drop into both eyes daily.  5 mL  2  . potassium chloride SA (K-DUR,KLOR-CON) 20 MEQ tablet Take 1 tablet (20 mEq total) by mouth daily.  30 tablet  3  . travoprost, benzalkonium, (TRAVATAN) 0.004 % ophthalmic solution Place 1 drop into both eyes at bedtime.  2.5 mL  2  . warfarin (COUMADIN) 2 MG tablet Take 1 tablet (2 mg total) by mouth as directed.  40 tablet  3    Past Medical History  Diagnosis Date  . Malleolar fracture   . Hypertension   . Dementia     mild  . Dyslipidemia   . Glaucoma   . Anemia   . Cardiomyopathy     presumed ischemic in the past with an EF of 35%. The  most recent EF in 03-2008, however, was 55 -60%  . Chronic renal insufficiency   . Bradycardia   . Paroxysmal atrial fibrillation   . Arthritis     "knees especially"    Past Surgical History  Procedure Date  . Hemorrhoid surgery   . Insert / replace / remove pacemaker 09/17/11    initial placement  . Insert / replace / remove pacemaker 09/18/11  . Abdominal hysterectomy   . Tonsillectomy and adenoidectomy   . Fracture surgery ~ 2007    RLE  . Cardioversion 07/2011  . Eye surgery 2002    "blood clot behind eye"    ROS: As stated in the HPI and negative for all other systems.  PHYSICAL EXAM BP 130/85  Pulse 84  Ht 5\' 3"  (1.6 m)  Wt 170 lb (77.111 kg)  BMI 30.11 kg/m2 GEN:  No distress NECK:  No jugular venous distention at 45 degrees, waveform within normal limits, carotid upstroke brisk and symmetric, no bruits, no thyromegaly LYMPHATICS:  No cervical adenopathy LUNGS:  Clear to auscultation bilaterally BACK:  No CVA tenderness CHEST:  Well healed pacemaker pocket HEART:  S1 and S2 within normal limits, no S3, no clicks, no rubs, soft apical systolic murmur ABD:  Positive bowel sounds normal in frequency  in pitch, no bruits, no rebound, no guarding, no midline pulsatile mass or hepatomeally EXT:  2 plus pulses throughout, mild edema, no cyanosis no clubbing SKIN:  No rashes no nodules   ASSESSMENT AND PLAN

## 2011-12-07 NOTE — Assessment & Plan Note (Signed)
She tolerates anticoagulation. She's on amiodarone. I will check labs to include TSH and liver profile.

## 2011-12-07 NOTE — Assessment & Plan Note (Signed)
I will check a BMET.

## 2011-12-07 NOTE — Assessment & Plan Note (Signed)
She seems to be euvolemic.  At this point, no change in therapy is indicated.  We have reviewed salt and fluid restrictions.  No further cardiovascular testing is indicated.   

## 2011-12-25 ENCOUNTER — Encounter: Payer: Self-pay | Admitting: Internal Medicine

## 2011-12-25 ENCOUNTER — Ambulatory Visit (INDEPENDENT_AMBULATORY_CARE_PROVIDER_SITE_OTHER): Payer: No Typology Code available for payment source | Admitting: Internal Medicine

## 2011-12-25 ENCOUNTER — Ambulatory Visit (INDEPENDENT_AMBULATORY_CARE_PROVIDER_SITE_OTHER): Payer: No Typology Code available for payment source

## 2011-12-25 VITALS — BP 154/87 | HR 84 | Ht 63.0 in | Wt 169.0 lb

## 2011-12-25 DIAGNOSIS — R0989 Other specified symptoms and signs involving the circulatory and respiratory systems: Secondary | ICD-10-CM

## 2011-12-25 DIAGNOSIS — Z950 Presence of cardiac pacemaker: Secondary | ICD-10-CM

## 2011-12-25 DIAGNOSIS — I4891 Unspecified atrial fibrillation: Secondary | ICD-10-CM

## 2011-12-25 DIAGNOSIS — I10 Essential (primary) hypertension: Secondary | ICD-10-CM

## 2011-12-25 DIAGNOSIS — I495 Sick sinus syndrome: Secondary | ICD-10-CM

## 2011-12-25 HISTORY — DX: Presence of cardiac pacemaker: Z95.0

## 2011-12-25 LAB — PACEMAKER DEVICE OBSERVATION
AL AMPLITUDE: 2.8 mv
AL IMPEDENCE PM: 397 Ohm
AL THRESHOLD: 0.5 V
ATRIAL PACING PM: 93
BAMS-0001: 150 {beats}/min
BATTERY VOLTAGE: 2.78 V
RV LEAD AMPLITUDE: 22.4 mv
RV LEAD IMPEDENCE PM: 909 Ohm
RV LEAD THRESHOLD: 0.5 V
VENTRICULAR PACING PM: 0

## 2011-12-25 LAB — POCT INR: INR: 1.6

## 2011-12-25 NOTE — Assessment & Plan Note (Signed)
Poorly controlled  meds may need uptitration

## 2011-12-25 NOTE — Patient Instructions (Signed)
Your physician wants you to follow-up in: January 2014 with Dr. Caryl Comes. You will receive a reminder letter in the mail two months in advance. If you don't receive a letter, please call our office to schedule the follow-up appointment.  Your physician recommends that you continue on your current medications as directed. Please refer to the Current Medication list given to you today.

## 2011-12-25 NOTE — Assessment & Plan Note (Signed)
Infrequent episodes,  Would consider decreasing amio 1400/wk>>1000 mg /wk when she sees Dr [facility] in June

## 2011-12-25 NOTE — Assessment & Plan Note (Signed)
The patient's device was interrogated and the information was fully reviewed.  The device was reprogrammed to  maiximzie longevity

## 2011-12-25 NOTE — Progress Notes (Signed)
  HPI  [name] is a 78 y.o. female Seen following pacer implanted for tachy brady syndrome in Jan complicated by atrial lead dislodgement of a passive lead  She takes amio for afib and surveillance labs in 3/13 were normal  Denies sob and chest pain   Past Medical History  Diagnosis Date  . Malleolar fracture   . Hypertension   . Dementia     mild  . Dyslipidemia   . Glaucoma   . Anemia   . Cardiomyopathy     presumed ischemic in the past with an EF of 35%. The most recent EF in 03-2008, however, was 55 -60%  . Chronic renal insufficiency   . Bradycardia   . Paroxysmal atrial fibrillation   . Arthritis     "knees especially"    Past Surgical History  Procedure Date  . Hemorrhoid surgery   . Insert / replace / remove pacemaker 09/17/11    initial placement  . Insert / replace / remove pacemaker 09/18/11  . Abdominal hysterectomy   . Tonsillectomy and adenoidectomy   . Fracture surgery ~ 2007    RLE  . Cardioversion 07/2011  . Eye surgery 2002    "blood clot behind eye"    Current Outpatient Prescriptions  Medication Sig Dispense Refill  . amiodarone (PACERONE) 200 MG tablet Take 1 tablet (200 mg total) by mouth daily.  30 tablet  11  . atorvastatin (LIPITOR) 10 MG tablet Take 1 tablet (10 mg total) by mouth daily.  30 tablet  11  . furosemide (LASIX) 80 MG tablet Take 1 tablet (80 mg total) by mouth daily.  30 tablet  11  . lisinopril (PRINIVIL,ZESTRIL) 5 MG tablet Take 1 tablet (5 mg total) by mouth daily.  30 tablet  2  . Multiple Vitamin (MULTIVITAMIN) tablet Take 1 tablet by mouth daily.        . Nutritional Supplements (MELATONIN PO) Take 1 capsule by mouth at bedtime as needed. For sleep.       Marland Kitchen olopatadine (PATANOL) 0.1 % ophthalmic solution Place 1 drop into both eyes daily.  5 mL  2  . potassium chloride SA (K-DUR,KLOR-CON) 20 MEQ tablet Take 1 tablet (20 mEq total) by mouth daily.  30 tablet  11  . potassium chloride SA (K-DUR,KLOR-CON) 20 MEQ tablet  Take 1 tablet (20 mEq total) by mouth daily.  30 tablet  11  . travoprost, benzalkonium, (TRAVATAN) 0.004 % ophthalmic solution Place 1 drop into both eyes at bedtime.  2.5 mL  2  . warfarin (COUMADIN) 2 MG tablet Take 1 tablet (2 mg total) by mouth as directed.  40 tablet  3    No Known Allergies  Review of Systems negative except from HPI and PMH  Physical Exam BP 154/87  Pulse 84  Ht 5\' 3"  (1.6 m)  Wt 169 lb (76.658 kg)  BMI 29.94 kg/m2 Well developed and well nourished in no acute distress HENT normal E scleral and icterus clear Neck Supple Scar well healed JVP flat; carotids brisk and full Clear to ausculation Regular rate and rhythm, no murmurs gallops or rub Soft with active bowel sounds No clubbing cyanosis none Edema Alert and oriented, grossly normal motor and sensory function Skin Warm and Dry   Assessment and  Plan

## 2011-12-29 ENCOUNTER — Telehealth: Payer: Self-pay | Admitting: Cardiology

## 2011-12-29 DIAGNOSIS — I495 Sick sinus syndrome: Secondary | ICD-10-CM

## 2011-12-29 MED ORDER — WARFARIN SODIUM 2 MG PO TABS: ORAL_TABLET | ORAL | Status: DC

## 2011-12-29 NOTE — Telephone Encounter (Signed)
PT NEEDS REFILL COUMADIN due to having to take extra she ran out sooner than could get refill uses [facility] [place]

## 2012-01-07 ENCOUNTER — Ambulatory Visit (INDEPENDENT_AMBULATORY_CARE_PROVIDER_SITE_OTHER): Payer: No Typology Code available for payment source | Admitting: *Deleted

## 2012-01-07 DIAGNOSIS — I4891 Unspecified atrial fibrillation: Secondary | ICD-10-CM

## 2012-01-07 LAB — POCT INR: INR: 1.9

## 2012-01-15 ENCOUNTER — Other Ambulatory Visit: Payer: Self-pay | Admitting: *Deleted

## 2012-01-15 MED ORDER — LISINOPRIL 5 MG PO TABS: 5.0000 mg | ORAL_TABLET | Freq: Every day | ORAL | Status: DC

## 2012-01-28 ENCOUNTER — Ambulatory Visit (INDEPENDENT_AMBULATORY_CARE_PROVIDER_SITE_OTHER): Payer: Medicaid Other | Admitting: *Deleted

## 2012-01-28 DIAGNOSIS — I4891 Unspecified atrial fibrillation: Secondary | ICD-10-CM

## 2012-01-28 LAB — POCT INR: INR: 2.5

## 2012-02-03 ENCOUNTER — Telehealth: Payer: Self-pay | Admitting: Cardiology

## 2012-02-03 NOTE — Telephone Encounter (Signed)
New msg  Dentist office calling about dental work she needs. She takes coumadin. She is there now

## 2012-02-03 NOTE — Telephone Encounter (Signed)
Spoke with Levada Dy from dentist office, they will be doing some deep cleaning with pt in future and request a form filled out and faxed back to them regarding holding coumadin. When we receive form will give to Dr Percival Spanish.

## 2012-02-04 NOTE — Telephone Encounter (Signed)
Have received form for clearance, form to [facility]/Dr Hochrein for completion.

## 2012-02-15 ENCOUNTER — Other Ambulatory Visit: Payer: Self-pay | Admitting: Cardiology

## 2012-02-25 ENCOUNTER — Ambulatory Visit (INDEPENDENT_AMBULATORY_CARE_PROVIDER_SITE_OTHER): Payer: No Typology Code available for payment source | Admitting: *Deleted

## 2012-02-25 DIAGNOSIS — I4891 Unspecified atrial fibrillation: Secondary | ICD-10-CM

## 2012-02-25 LAB — POCT INR: INR: 2.4

## 2012-03-10 ENCOUNTER — Encounter: Payer: Self-pay | Admitting: Cardiology

## 2012-03-10 ENCOUNTER — Ambulatory Visit (INDEPENDENT_AMBULATORY_CARE_PROVIDER_SITE_OTHER): Payer: Medicaid Other | Admitting: Cardiology

## 2012-03-10 VITALS — BP 150/80 | HR 82 | Ht 63.0 in | Wt 170.8 lb

## 2012-03-10 DIAGNOSIS — I495 Sick sinus syndrome: Secondary | ICD-10-CM

## 2012-03-10 DIAGNOSIS — I428 Other cardiomyopathies: Secondary | ICD-10-CM

## 2012-03-10 DIAGNOSIS — I10 Essential (primary) hypertension: Secondary | ICD-10-CM

## 2012-03-10 LAB — BASIC METABOLIC PANEL
BUN: 29 mg/dL — ABNORMAL HIGH (ref 6–23)
CO2: 32 mEq/L (ref 19–32)
Calcium: 9.9 mg/dL (ref 8.4–10.5)
Chloride: 101 mEq/L (ref 96–112)
Creatinine, Ser: 1.4 mg/dL — ABNORMAL HIGH (ref 0.4–1.2)
GFR: 46.94 mL/min — ABNORMAL LOW (ref >60.00)
Glucose, Bld: 95 mg/dL (ref 70–99)
Potassium: 4 mEq/L (ref 3.5–5.1)
Sodium: 142 mEq/L (ref 135–145)

## 2012-03-10 NOTE — Assessment & Plan Note (Signed)
She's had no symptomatic tachyarrhythmias. She's up-to-date pacemaker followup. I checked her TSH and liver profile on amiodarone. She will continue the meds as listed.

## 2012-03-10 NOTE — Assessment & Plan Note (Signed)
Her blood pressure is slightly elevated today but I reviewed her blood pressures vary and it is typically well controlled. No change in therapy is indicated.

## 2012-03-10 NOTE — Assessment & Plan Note (Signed)
She seems to be euvolemic.  At this point, no change in therapy is indicated.  We have reviewed salt and fluid restrictions.  No further cardiovascular testing is indicated.  I will check a BMET.

## 2012-03-10 NOTE — Telephone Encounter (Signed)
Discussed at appt.

## 2012-03-10 NOTE — Patient Instructions (Signed)
The current medical regimen is effective;  continue present plan and medications.  Please have blood work today (BMP)  Follow up in 3 months with Dr Percival Spanish.

## 2012-03-10 NOTE — Progress Notes (Signed)
HPI The patient presents after followup of had tachybradycardia syndrome.  Since I last saw her she has done well.  The patient denies any new symptoms such as chest discomfort, neck or arm discomfort. There has been no new shortness of breath, PND or orthopnea. There have been no reported palpitations, presyncope or syncope.  She does some mild household chores.    No Known Allergies  Current Outpatient Prescriptions  Medication Sig Dispense Refill  . amiodarone (PACERONE) 200 MG tablet Take 1 tablet (200 mg total) by mouth daily.  30 tablet  11  . atorvastatin (LIPITOR) 10 MG tablet Take 1 tablet (10 mg total) by mouth daily.  30 tablet  11  . furosemide (LASIX) 80 MG tablet Take 1 tablet (80 mg total) by mouth daily.  30 tablet  11  . lisinopril (PRINIVIL,ZESTRIL) 5 MG tablet Take 1 tablet (5 mg total) by mouth daily.  30 tablet  6  . Multiple Vitamin (MULTIVITAMIN) tablet Take 1 tablet by mouth daily.        . Nutritional Supplements (MELATONIN PO) Take 1 capsule by mouth at bedtime as needed. For sleep.       Marland Kitchen olopatadine (PATANOL) 0.1 % ophthalmic solution Place 1 drop into both eyes daily.  5 mL  2  . potassium chloride SA (K-DUR,KLOR-CON) 20 MEQ tablet Take 1 tablet (20 mEq total) by mouth daily.  30 tablet  11  . travoprost, benzalkonium, (TRAVATAN) 0.004 % ophthalmic solution Place 1 drop into both eyes at bedtime.  2.5 mL  2  . warfarin (COUMADIN) 2 MG tablet Take as directed by anticoagulation clinic  50 tablet  3  . DISCONTD: potassium chloride SA (K-DUR,KLOR-CON) 20 MEQ tablet Take 1 tablet (20 mEq total) by mouth daily.  30 tablet  11    Past Medical History  Diagnosis Date  . Malleolar fracture   . Hypertension   . Dementia     mild  . Dyslipidemia   . Glaucoma   . Anemia   . Cardiomyopathy     presumed ischemic in the past with an EF of 35%. The most recent EF in 03-2008, however, was 55 -60%  . Chronic renal insufficiency   . Bradycardia   . Paroxysmal atrial  fibrillation   . Arthritis     "knees especially"    Past Surgical History  Procedure Date  . Hemorrhoid surgery   . Insert / replace / remove pacemaker 09/17/11    initial placement  . Insert / replace / remove pacemaker 09/18/11  . Abdominal hysterectomy   . Tonsillectomy and adenoidectomy   . Fracture surgery ~ 2007    RLE  . Cardioversion 07/2011  . Eye surgery 2002    "blood clot behind eye"    ROS: As stated in the HPI and negative for all other systems.  PHYSICAL EXAM BP 150/80  Pulse 82  Ht 5\' 3"  (1.6 m)  Wt 170 lb 12.8 oz (77.474 kg)  BMI 30.26 kg/m2 GEN:  No distress NECK:  No jugular venous distention at 45 degrees, waveform within normal limits, carotid upstroke brisk and symmetric, no bruits, no thyromegaly LYMPHATICS:  No cervical adenopathy LUNGS:  Clear to auscultation bilaterally BACK:  No CVA tenderness CHEST:  Well healed pacemaker pocket HEART:  S1 and S2 within normal limits, no S3, no clicks, no rubs, soft apical systolic murmur ABD:  Positive bowel sounds normal in frequency in pitch, no bruits, no rebound, no guarding, no midline  pulsatile mass or hepatomeally EXT:  2 plus pulses throughout, mild edema, no cyanosis no clubbing SKIN:  No rashes no nodules  EKG:  Atrial paced rhythm, left axis deviation, interventricular conduction delay, poor anterior R wave progression.  03/10/2012   ASSESSMENT AND PLAN

## 2012-03-14 ENCOUNTER — Telehealth: Payer: Self-pay | Admitting: Cardiology

## 2012-03-14 NOTE — Telephone Encounter (Signed)
New msg Pt wants to know should she continue taking potassium. Please call

## 2012-03-14 NOTE — Telephone Encounter (Signed)
Spoke with pt, told her to continue k+, pt agreed to plan.

## 2012-03-14 NOTE — Telephone Encounter (Signed)
msg left, Pt lab work was good 4 days ago, if she was on it then she should continue with K+, asked her to call back with questions, number provided.

## 2012-03-24 ENCOUNTER — Ambulatory Visit (INDEPENDENT_AMBULATORY_CARE_PROVIDER_SITE_OTHER): Payer: No Typology Code available for payment source

## 2012-03-24 DIAGNOSIS — I4891 Unspecified atrial fibrillation: Secondary | ICD-10-CM

## 2012-03-24 LAB — POCT INR: INR: 2.6

## 2012-05-06 ENCOUNTER — Other Ambulatory Visit: Payer: Self-pay | Admitting: Cardiology

## 2012-05-06 MED ORDER — WARFARIN SODIUM 2 MG PO TABS: ORAL_TABLET | ORAL | Status: DC

## 2012-05-12 ENCOUNTER — Encounter: Payer: Self-pay | Admitting: Cardiology

## 2012-05-12 ENCOUNTER — Ambulatory Visit (INDEPENDENT_AMBULATORY_CARE_PROVIDER_SITE_OTHER): Payer: No Typology Code available for payment source

## 2012-05-12 ENCOUNTER — Ambulatory Visit (INDEPENDENT_AMBULATORY_CARE_PROVIDER_SITE_OTHER): Payer: No Typology Code available for payment source | Admitting: Cardiology

## 2012-05-12 VITALS — BP 130/80 | HR 78 | Ht 62.0 in | Wt 151.0 lb

## 2012-05-12 DIAGNOSIS — I4891 Unspecified atrial fibrillation: Secondary | ICD-10-CM

## 2012-05-12 DIAGNOSIS — I495 Sick sinus syndrome: Secondary | ICD-10-CM

## 2012-05-12 DIAGNOSIS — I509 Heart failure, unspecified: Secondary | ICD-10-CM

## 2012-05-12 DIAGNOSIS — I10 Essential (primary) hypertension: Secondary | ICD-10-CM

## 2012-05-12 DIAGNOSIS — I428 Other cardiomyopathies: Secondary | ICD-10-CM

## 2012-05-12 DIAGNOSIS — E785 Hyperlipidemia, unspecified: Secondary | ICD-10-CM

## 2012-05-12 DIAGNOSIS — I498 Other specified cardiac arrhythmias: Secondary | ICD-10-CM

## 2012-05-12 LAB — POCT INR: INR: 4

## 2012-05-12 NOTE — Progress Notes (Signed)
HPI The patient presents after followup of had tachybradycardia syndrome.  Since I last saw her she has done OK.  I do note that she has lost 19 lbs since the last visit. She says she needs but not that much. She reports that she's been feeling well. She denies any chest pressure. She's not noticing any palpitations, presyncope or syncope. She's had no new shortness of breath. She's had no weight gain or edema. She does have dementia and lives by herself but her daughter does not think she's forgetting to eat.   No Known Allergies  Current Outpatient Prescriptions  Medication Sig Dispense Refill  . amiodarone (PACERONE) 200 MG tablet Take 1 tablet (200 mg total) by mouth daily.  30 tablet  11  . atorvastatin (LIPITOR) 10 MG tablet Take 1 tablet (10 mg total) by mouth daily.  30 tablet  11  . furosemide (LASIX) 80 MG tablet Take 1 tablet (80 mg total) by mouth daily.  30 tablet  11  . lisinopril (PRINIVIL,ZESTRIL) 5 MG tablet Take 1 tablet (5 mg total) by mouth daily.  30 tablet  6  . Multiple Vitamin (MULTIVITAMIN) tablet Take 1 tablet by mouth daily.        . Nutritional Supplements (MELATONIN PO) Take 1 capsule by mouth at bedtime as needed. For sleep.       Marland Kitchen olopatadine (PATANOL) 0.1 % ophthalmic solution Place 1 drop into both eyes daily.  5 mL  2  . potassium chloride SA (K-DUR,KLOR-CON) 20 MEQ tablet Take 1 tablet (20 mEq total) by mouth daily.  30 tablet  11  . travoprost, benzalkonium, (TRAVATAN) 0.004 % ophthalmic solution Place 1 drop into both eyes at bedtime.  2.5 mL  2  . warfarin (COUMADIN) 2 MG tablet Take as directed by anticoagulation clinic  50 tablet  3    Past Medical History  Diagnosis Date  . Malleolar fracture   . Hypertension   . Dementia     mild  . Dyslipidemia   . Glaucoma   . Anemia   . Cardiomyopathy     presumed ischemic in the past with an EF of 35%. The most recent EF in 03-2008, however, was 55 -60%  . Chronic renal insufficiency   . Bradycardia     . Paroxysmal atrial fibrillation   . Arthritis     "knees especially"    Past Surgical History  Procedure Date  . Hemorrhoid surgery   . Insert / replace / remove pacemaker 09/17/11    initial placement  . Insert / replace / remove pacemaker 09/18/11  . Abdominal hysterectomy   . Tonsillectomy and adenoidectomy   . Fracture surgery ~ 2007    RLE  . Cardioversion 07/2011  . Eye surgery 2002    "blood clot behind eye"    ROS: As stated in the HPI and negative for all other systems.  PHYSICAL EXAM BP 130/80  Pulse 78  Ht 5\' 2"  (1.575 m)  Wt 151 lb (68.493 kg)  BMI 27.62 kg/m2 GEN:  No distress NECK:  No jugular venous distention at 45 degrees, waveform within normal limits, carotid upstroke brisk and symmetric, no bruits, no thyromegaly LYMPHATICS:  No cervical adenopathy LUNGS:  Clear to auscultation bilaterally BACK:  No CVA tenderness CHEST:  Well healed pacemaker pocket HEART:  S1 and S2 within normal limits, no S3, no clicks, no rubs, soft apical systolic murmur ABD:  Positive bowel sounds normal in frequency in pitch, no bruits,  no rebound, no guarding, no midline pulsatile mass or hepatomeally EXT:  2 plus pulses throughout, mild edema, no cyanosis no clubbing  ASSESSMENT AND PLAN  CARDIOMYOPATHY -  She seems to be euvolemic. At this point, no change in therapy is indicated. We have reviewed salt and fluid restrictions. No further cardiovascular testing is indicated.   HYPERTENSION -  The blood pressure is at target. No change in medications is indicated. We will continue with therapeutic lifestyle changes (TLC).   Tachycardia-bradycardia syndrome -  She's had no symptomatic tachyarrhythmias. She's up-to-date pacemaker followup. I checked her TSH and liver profile on amiodarone when she comes back for her next appt.  This was checked last in March.  Weight loss -  I discussed this with her daughter and they will follow a calorie count at home.

## 2012-05-12 NOTE — Patient Instructions (Addendum)
The current medical regimen is effective;  continue present plan and medications.  Follow up in 2 months with Dr Percival Spanish.

## 2012-06-02 ENCOUNTER — Ambulatory Visit (INDEPENDENT_AMBULATORY_CARE_PROVIDER_SITE_OTHER): Payer: No Typology Code available for payment source | Admitting: *Deleted

## 2012-06-02 DIAGNOSIS — I4891 Unspecified atrial fibrillation: Secondary | ICD-10-CM

## 2012-06-02 LAB — POCT INR: INR: 2.8

## 2012-06-09 ENCOUNTER — Ambulatory Visit: Payer: No Typology Code available for payment source | Admitting: Cardiology

## 2012-06-29 ENCOUNTER — Encounter: Payer: Self-pay | Admitting: Internal Medicine

## 2012-06-29 ENCOUNTER — Ambulatory Visit (INDEPENDENT_AMBULATORY_CARE_PROVIDER_SITE_OTHER): Payer: No Typology Code available for payment source | Admitting: *Deleted

## 2012-06-29 ENCOUNTER — Ambulatory Visit (INDEPENDENT_AMBULATORY_CARE_PROVIDER_SITE_OTHER): Payer: No Typology Code available for payment source | Admitting: Internal Medicine

## 2012-06-29 VITALS — BP 159/92 | HR 86 | Ht 62.0 in | Wt 149.4 lb

## 2012-06-29 DIAGNOSIS — I495 Sick sinus syndrome: Secondary | ICD-10-CM

## 2012-06-29 DIAGNOSIS — I4891 Unspecified atrial fibrillation: Secondary | ICD-10-CM

## 2012-06-29 DIAGNOSIS — Z950 Presence of cardiac pacemaker: Secondary | ICD-10-CM

## 2012-06-29 DIAGNOSIS — I10 Essential (primary) hypertension: Secondary | ICD-10-CM

## 2012-06-29 LAB — COMPREHENSIVE METABOLIC PANEL
ALT: 26 U/L (ref 0–35)
AST: 23 U/L (ref 0–37)
Albumin: 3.6 g/dL (ref 3.5–5.2)
Alkaline Phosphatase: 53 U/L (ref 39–117)
BUN: 30 mg/dL — ABNORMAL HIGH (ref 6–23)
CO2: 32 mEq/L (ref 19–32)
Calcium: 9.7 mg/dL (ref 8.4–10.5)
Chloride: 100 mEq/L (ref 96–112)
Creatinine, Ser: 1.5 mg/dL — ABNORMAL HIGH (ref 0.4–1.2)
GFR: 42.91 mL/min — ABNORMAL LOW (ref >60.00)
Glucose, Bld: 92 mg/dL (ref 70–99)
Potassium: 4 mEq/L (ref 3.5–5.1)
Sodium: 141 mEq/L (ref 135–145)
Total Bilirubin: 0.7 mg/dL (ref 0.3–1.2)
Total Protein: 6.7 g/dL (ref 6.0–8.3)

## 2012-06-29 LAB — POCT INR: INR: 3.1

## 2012-06-29 LAB — TSH: TSH: 3.17 u[IU]/mL (ref 0.35–5.50)

## 2012-06-29 MED ORDER — AMIODARONE HCL 200 MG PO TABS: 200.0000 mg | ORAL_TABLET | Freq: Every day | ORAL | Status: DC

## 2012-06-29 NOTE — Progress Notes (Signed)
f Patient Care Team: Chriss Czar as PCP - General (Family Medicine)   HPI  [name] is a 78 y.o. female Seen following pacer implanted for tachy brady syndrome in Jan 13 complicated by atrial lead dislodgement of a passive lead which was revised by Dr. Rayann Heman with new atrial lead She takes amio for afib and surveillance labs in 3/13 were normal  Denies sob She had episodes of chest pain last week that were associated with nausea a brackish taste in precipitated by drinking or issues. It was repetitive.  Last amiodarone surveillance laboratories were  March 2013   Past Medical History  Diagnosis Date  . Malleolar fracture   . Hypertension   . Dementia     mild  . Dyslipidemia   . Glaucoma(365)   . Anemia   . Cardiomyopathy     presumed ischemic in the past with an EF of 35%. The most recent EF in 03-2008, however, was 55 -60%  . Chronic renal insufficiency   . Bradycardia   . Paroxysmal atrial fibrillation   . Arthritis     "knees especially"    Past Surgical History  Procedure Date  . Hemorrhoid surgery   . Insert / replace / remove pacemaker 09/17/11    initial placement  . Insert / replace / remove pacemaker 09/18/11  . Abdominal hysterectomy   . Tonsillectomy and adenoidectomy   . Fracture surgery ~ 2007    RLE  . Cardioversion 07/2011  . Eye surgery 2002    "blood clot behind eye"    Current Outpatient Prescriptions  Medication Sig Dispense Refill  . amiodarone (PACERONE) 200 MG tablet Take 1 tablet (200 mg total) by mouth daily.  30 tablet  11  . atorvastatin (LIPITOR) 10 MG tablet Take 1 tablet (10 mg total) by mouth daily.  30 tablet  11  . furosemide (LASIX) 80 MG tablet Take 1 tablet (80 mg total) by mouth daily.  30 tablet  11  . lisinopril (PRINIVIL,ZESTRIL) 5 MG tablet Take 1 tablet (5 mg total) by mouth daily.  30 tablet  6  . Multiple Vitamin (MULTIVITAMIN) tablet Take 1 tablet by mouth daily.        . Nutritional Supplements (MELATONIN PO)  Take 1 capsule by mouth at bedtime as needed. For sleep.       Marland Kitchen olopatadine (PATANOL) 0.1 % ophthalmic solution Place 1 drop into both eyes daily.  5 mL  2  . potassium chloride SA (K-DUR,KLOR-CON) 20 MEQ tablet Take 1 tablet (20 mEq total) by mouth daily.  30 tablet  11  . travoprost, benzalkonium, (TRAVATAN) 0.004 % ophthalmic solution Place 1 drop into both eyes at bedtime.  2.5 mL  2  . warfarin (COUMADIN) 2 MG tablet Take as directed by anticoagulation clinic  50 tablet  3    No Known Allergies  Review of Systems negative except from HPI and PMH  Physical Exam BP 159/92  Pulse 86  Ht 5\' 2"  (1.575 m)  Wt 149 lb 6.4 oz (67.767 kg)  BMI 27.33 kg/m2 Well developed and well nourished in no acute distress HENT normal E scleral and icterus clear Neck Supple JVP flat;  Clear to ausculation egular rate and rhythm, no murmurs gallops or rub Soft with active bowel sounds No clubbing cyanosis none Edema Alert and oriented, grossly normal motor and sensory function Skin Warm and Dry  Electrocardiogram demonstrates atrial pacing with intrinsic conduction Intervals 20/14/40 Axis -48 LVH with QRS widening and repolarization  abnormalities  Assessment and  Plan

## 2012-06-29 NOTE — Assessment & Plan Note (Signed)
Essentially 100% atrial pacing with good heart rate excursion

## 2012-06-29 NOTE — Assessment & Plan Note (Signed)
The patient's device was interrogated.  The information was reviewed. No changes were made in the programming.   Inconsequential amounts of atrial fibrillation

## 2012-06-29 NOTE — Assessment & Plan Note (Signed)
Modestly elevated; the blood pressures at home are in the 1:30 range. We will make no adjustments

## 2012-06-29 NOTE — Assessment & Plan Note (Addendum)
With frequent atrial fibrillation, we will plan to decrease her amiodarone from 7 days a week-2 days a week. We'll check amiodarone surveillance laboratories today as they were lastt checked in March

## 2012-06-29 NOTE — Patient Instructions (Addendum)
Keep upcoming appt with Dr. Percival Spanish.  Your physician wants you to follow-up in: January 2015 with Dr. Caryl Comes (15 months).  You will receive a reminder letter in the mail two months in advance. If you don't receive a letter, please call our office to schedule the follow-up appointment.  LABS TODAY:  TSH, CMET  Decrease Amiodarone to FIVE days per week only.

## 2012-07-21 ENCOUNTER — Ambulatory Visit (INDEPENDENT_AMBULATORY_CARE_PROVIDER_SITE_OTHER): Payer: No Typology Code available for payment source | Admitting: Cardiology

## 2012-07-21 ENCOUNTER — Encounter: Payer: Self-pay | Admitting: Cardiology

## 2012-07-21 ENCOUNTER — Ambulatory Visit (INDEPENDENT_AMBULATORY_CARE_PROVIDER_SITE_OTHER): Payer: No Typology Code available for payment source | Admitting: *Deleted

## 2012-07-21 VITALS — BP 150/83 | HR 73 | Ht 62.0 in | Wt 157.0 lb

## 2012-07-21 DIAGNOSIS — I428 Other cardiomyopathies: Secondary | ICD-10-CM

## 2012-07-21 DIAGNOSIS — I10 Essential (primary) hypertension: Secondary | ICD-10-CM

## 2012-07-21 DIAGNOSIS — I4891 Unspecified atrial fibrillation: Secondary | ICD-10-CM

## 2012-07-21 DIAGNOSIS — N259 Disorder resulting from impaired renal tubular function, unspecified: Secondary | ICD-10-CM

## 2012-07-21 LAB — POCT INR: INR: 3

## 2012-07-21 NOTE — Progress Notes (Signed)
HPI The patient presents after followup of had tachybradycardia syndrome.  Since I last saw her she has done OK.  She did have her amiodarone dose reduced slightly by Dr. Caryl Comes. She had followup labs which were normal. She's had no further weight loss which was an issue before. She feels good. The patient denies any new symptoms such as chest discomfort, neck or arm discomfort. There has been no new shortness of breath, PND or orthopnea. There have been no reported palpitations, presyncope or syncope.   No Known Allergies  Current Outpatient Prescriptions  Medication Sig Dispense Refill  . amiodarone (PACERONE) 200 MG tablet Take 1 tablet (200 mg total) by mouth daily. Take Amiodarone 5 days per week.  30 tablet  11  . atorvastatin (LIPITOR) 10 MG tablet Take 1 tablet (10 mg total) by mouth daily.  30 tablet  11  . furosemide (LASIX) 80 MG tablet Take 1 tablet (80 mg total) by mouth daily.  30 tablet  11  . lisinopril (PRINIVIL,ZESTRIL) 5 MG tablet Take 1 tablet (5 mg total) by mouth daily.  30 tablet  6  . Multiple Vitamin (MULTIVITAMIN) tablet Take 1 tablet by mouth daily.        . Nutritional Supplements (MELATONIN PO) Take 1 capsule by mouth at bedtime as needed. For sleep.       Marland Kitchen olopatadine (PATANOL) 0.1 % ophthalmic solution Place 1 drop into both eyes daily.  5 mL  2  . potassium chloride SA (K-DUR,KLOR-CON) 20 MEQ tablet Take 1 tablet (20 mEq total) by mouth daily.  30 tablet  11  . travoprost, benzalkonium, (TRAVATAN) 0.004 % ophthalmic solution Place 1 drop into both eyes at bedtime.  2.5 mL  2  . warfarin (COUMADIN) 2 MG tablet Take as directed by anticoagulation clinic  50 tablet  3    Past Medical History  Diagnosis Date  . Malleolar fracture   . Hypertension   . Dementia     mild  . Dyslipidemia   . Glaucoma(365)   . Anemia   . Cardiomyopathy     presumed ischemic in the past with an EF of 35%. The most recent EF in 03-2008, however, was 55 -60%  . Chronic renal  insufficiency   . Bradycardia   . Paroxysmal atrial fibrillation   . Arthritis     "knees especially"    Past Surgical History  Procedure Date  . Hemorrhoid surgery   . Insert / replace / remove pacemaker 09/17/11    initial placement  . Insert / replace / remove pacemaker 09/18/11  . Abdominal hysterectomy   . Tonsillectomy and adenoidectomy   . Fracture surgery ~ 2007    RLE  . Cardioversion 07/2011  . Eye surgery 2002    "blood clot behind eye"    ROS: As stated in the HPI and negative for all other systems.  PHYSICAL EXAM BP 150/83  Pulse 73  Ht 5\' 2"  (1.575 m)  Wt 157 lb (71.215 kg)  BMI 28.72 kg/m2 GEN:  No distress NECK:  No jugular venous distention at 45 degrees, waveform within normal limits, carotid upstroke brisk and symmetric, no bruits, no thyromegaly LYMPHATICS:  No cervical adenopathy LUNGS:  Clear to auscultation bilaterally BACK:  No CVA tenderness CHEST:  Well healed pacemaker pocket HEART:  S1 and S2 within normal limits, no S3, no clicks, no rubs, soft apical systolic murmur ABD:  Positive bowel sounds normal in frequency in pitch, no bruits, no rebound, no  guarding, no midline pulsatile mass or hepatomeally EXT:  2 plus pulses throughout, mild edema, no cyanosis no clubbing  ASSESSMENT AND PLAN  CARDIOMYOPATHY -  She seems to be euvolemic. At this point, no change in therapy is indicated. We have reviewed salt and fluid restrictions. No further cardiovascular testing is indicated.   HYPERTENSION -  The blood pressure is at target on her home readings.  I reviewed these. No change in medications is indicated. We will continue with therapeutic lifestyle changes (TLC).  Tachycardia-bradycardia syndrome -  She's had no symptomatic tachyarrhythmias. She's up-to-date pacemaker followup

## 2012-07-21 NOTE — Patient Instructions (Addendum)
The current medical regimen is effective;  continue present plan and medications.  Follow up in 2 months with Dr Percival Spanish

## 2012-08-18 ENCOUNTER — Ambulatory Visit (INDEPENDENT_AMBULATORY_CARE_PROVIDER_SITE_OTHER): Payer: No Typology Code available for payment source | Admitting: *Deleted

## 2012-08-18 ENCOUNTER — Ambulatory Visit (INDEPENDENT_AMBULATORY_CARE_PROVIDER_SITE_OTHER): Payer: No Typology Code available for payment source | Admitting: Internal Medicine

## 2012-08-18 ENCOUNTER — Encounter: Payer: Self-pay | Admitting: Internal Medicine

## 2012-08-18 VITALS — BP 132/80 | HR 78 | Resp 18

## 2012-08-18 DIAGNOSIS — T827XXA Infection and inflammatory reaction due to other cardiac and vascular devices, implants and grafts, initial encounter: Secondary | ICD-10-CM | POA: Insufficient documentation

## 2012-08-18 DIAGNOSIS — I495 Sick sinus syndrome: Secondary | ICD-10-CM

## 2012-08-18 DIAGNOSIS — I4891 Unspecified atrial fibrillation: Secondary | ICD-10-CM

## 2012-08-18 DIAGNOSIS — I428 Other cardiomyopathies: Secondary | ICD-10-CM

## 2012-08-18 HISTORY — DX: Infection and inflammatory reaction due to other cardiac and vascular devices, implants and grafts, initial encounter: T82.7XXA

## 2012-08-18 LAB — PACEMAKER DEVICE OBSERVATION
AL AMPLITUDE: 1.4 mv
AL IMPEDENCE PM: 353 Ohm
AL THRESHOLD: 0.5 V
ATRIAL PACING PM: 96
BAMS-0001: 150 {beats}/min
BATTERY VOLTAGE: 2.78 V
RV LEAD AMPLITUDE: 22.4 mv
RV LEAD IMPEDENCE PM: 779 Ohm
RV LEAD THRESHOLD: 0.5 V
VENTRICULAR PACING PM: 0

## 2012-08-18 LAB — POCT INR: INR: 3.8

## 2012-08-18 NOTE — Assessment & Plan Note (Signed)
The patient comes in today because of concerns of possible pacemaker infection. The site looks fine. I've encouraged her to take some over-the-counter pain medications and followup with her PCP as necessary.

## 2012-08-18 NOTE — Patient Instructions (Addendum)
Your physician wants you to follow-up in: 1 year with Dr. Caryl Comes.  You will receive a reminder letter in the mail two months in advance. If you don't receive a letter, please call our office to schedule the follow-up appointment.  Remote monitoring is used to monitor your Pacemaker of ICD from home. This monitoring reduces the number of office visits required to check your device to one time per year. It allows [place] to keep an eye on the functioning of your device to ensure it is working properly. You are scheduled for a device check from home on 11/21/2012.  You may send your transmission at any time that day. If you have a wireless device, the transmission will be sent automatically. After your physician reviews your transmission, you will receive a postcard with your next transmission date.

## 2012-08-18 NOTE — Progress Notes (Signed)
  Patient Care Team: Chriss Czar as PCP - General (Family Medicine)   HPI  [name] is a 78 y.o. female Seen because of concerns about shoulder discomfort following pacemaker implantation one year ago.  Procedure was complicated by dislodgment of the initially implanted passive atrial lead. she has tachybradycardia syndrome.she has been treated with amiodarone for atrial fibrillation.  she comes in today because of concern about discomfort in her left shoulder area and neck area and wonders whether it is coming from her pacemaker. It started relatively abruptly a couple of days ago.   Past Medical History  Diagnosis Date  . Malleolar fracture   . Hypertension   . Dementia     mild  . Dyslipidemia   . Glaucoma(365)   . Anemia   . Cardiomyopathy     presumed ischemic in the past with an EF of 35%. The most recent EF in 03-2008, however, was 55 -60%  . Chronic renal insufficiency   . Bradycardia   . Paroxysmal atrial fibrillation   . Arthritis     "knees especially"    Past Surgical History  Procedure Date  . Hemorrhoid surgery   . Insert / replace / remove pacemaker 09/17/11    initial placement  . Insert / replace / remove pacemaker 09/18/11  . Abdominal hysterectomy   . Tonsillectomy and adenoidectomy   . Fracture surgery ~ 2007    RLE  . Cardioversion 07/2011  . Eye surgery 2002    "blood clot behind eye"    Current Outpatient Prescriptions  Medication Sig Dispense Refill  . amiodarone (PACERONE) 200 MG tablet Take 1 tablet (200 mg total) by mouth daily. Take Amiodarone 5 days per week.  30 tablet  11  . atorvastatin (LIPITOR) 10 MG tablet Take 1 tablet (10 mg total) by mouth daily.  30 tablet  11  . furosemide (LASIX) 80 MG tablet Take 1 tablet (80 mg total) by mouth daily.  30 tablet  11  . lisinopril (PRINIVIL,ZESTRIL) 5 MG tablet Take 1 tablet (5 mg total) by mouth daily.  30 tablet  6  . Multiple Vitamin (MULTIVITAMIN) tablet Take 1 tablet by mouth  daily.        . Nutritional Supplements (MELATONIN PO) Take 1 capsule by mouth at bedtime as needed. For sleep.       Marland Kitchen olopatadine (PATANOL) 0.1 % ophthalmic solution Place 1 drop into both eyes daily.  5 mL  2  . potassium chloride SA (K-DUR,KLOR-CON) 20 MEQ tablet Take 1 tablet (20 mEq total) by mouth daily.  30 tablet  11  . travoprost, benzalkonium, (TRAVATAN) 0.004 % ophthalmic solution Place 1 drop into both eyes at bedtime.  2.5 mL  2  . warfarin (COUMADIN) 2 MG tablet Take as directed by anticoagulation clinic  50 tablet  3    No Known Allergies  Review of Systems negative except from HPI and PMH  Physical Exam.vs BP 132/80  Pulse 78  Resp 18  SpO2 96%  Device pocket well healed; without hematoma or erythema  Skin Warm and Dry    Assessment and  Plan

## 2012-08-31 ENCOUNTER — Ambulatory Visit (INDEPENDENT_AMBULATORY_CARE_PROVIDER_SITE_OTHER): Payer: No Typology Code available for payment source | Admitting: Pharmacist

## 2012-08-31 DIAGNOSIS — I4891 Unspecified atrial fibrillation: Secondary | ICD-10-CM

## 2012-08-31 LAB — POCT INR: INR: 3.2

## 2012-09-02 ENCOUNTER — Other Ambulatory Visit: Payer: Self-pay | Admitting: Cardiology

## 2012-09-20 ENCOUNTER — Ambulatory Visit (INDEPENDENT_AMBULATORY_CARE_PROVIDER_SITE_OTHER): Payer: No Typology Code available for payment source

## 2012-09-20 ENCOUNTER — Ambulatory Visit (INDEPENDENT_AMBULATORY_CARE_PROVIDER_SITE_OTHER): Payer: No Typology Code available for payment source | Admitting: Cardiology

## 2012-09-20 ENCOUNTER — Encounter: Payer: Self-pay | Admitting: Cardiology

## 2012-09-20 VITALS — BP 142/100 | HR 80 | Ht 62.0 in | Wt 148.4 lb

## 2012-09-20 DIAGNOSIS — I428 Other cardiomyopathies: Secondary | ICD-10-CM

## 2012-09-20 DIAGNOSIS — I4891 Unspecified atrial fibrillation: Secondary | ICD-10-CM

## 2012-09-20 DIAGNOSIS — E785 Hyperlipidemia, unspecified: Secondary | ICD-10-CM

## 2012-09-20 DIAGNOSIS — N259 Disorder resulting from impaired renal tubular function, unspecified: Secondary | ICD-10-CM

## 2012-09-20 DIAGNOSIS — I509 Heart failure, unspecified: Secondary | ICD-10-CM

## 2012-09-20 DIAGNOSIS — I10 Essential (primary) hypertension: Secondary | ICD-10-CM

## 2012-09-20 LAB — POCT INR: INR: 2.8

## 2012-09-20 NOTE — Patient Instructions (Addendum)
The current medical regimen is effective;  continue present plan and medications.  Follow up in 3 months with Dr Percival Spanish.  You will receive a letter in the mail 2 months before you are due.  Please call us when you receive this letter to schedule your follow up appointment.

## 2012-09-20 NOTE — Progress Notes (Signed)
HPI The patient presents after followup of had tachybradycardia syndrome.  Since I last saw her she has done OK.  She did have some discomfort over her pacemaker site but saw Dr. Caryl Comes and was treated only with Tylenol. She has had followup of her amiodarone with normal TSH and liver enzymes recently. The patient denies any new symptoms such as chest discomfort, neck or arm discomfort. There has been no new shortness of breath, PND or orthopnea. There have been no reported palpitations, presyncope or syncope.   No Known Allergies  Current Outpatient Prescriptions  Medication Sig Dispense Refill  . amiodarone (PACERONE) 200 MG tablet Take 1 tablet (200 mg total) by mouth daily. Take Amiodarone 5 days per week.  30 tablet  11  . atorvastatin (LIPITOR) 10 MG tablet Take 1 tablet (10 mg total) by mouth daily.  30 tablet  11  . furosemide (LASIX) 80 MG tablet Take 1 tablet (80 mg total) by mouth daily.  30 tablet  11  . lisinopril (PRINIVIL,ZESTRIL) 5 MG tablet TAKE ONE TABLET BY MOUTH ONE TIME DAILY  30 tablet  0  . Multiple Vitamin (MULTIVITAMIN) tablet Take 1 tablet by mouth daily.        . Nutritional Supplements (MELATONIN PO) Take 1 capsule by mouth at bedtime as needed. For sleep.       Marland Kitchen olopatadine (PATANOL) 0.1 % ophthalmic solution Place 1 drop into both eyes daily.  5 mL  2  . potassium chloride SA (K-DUR,KLOR-CON) 20 MEQ tablet Take 1 tablet (20 mEq total) by mouth daily.  30 tablet  11  . travoprost, benzalkonium, (TRAVATAN) 0.004 % ophthalmic solution Place 1 drop into both eyes at bedtime.  2.5 mL  2  . warfarin (COUMADIN) 2 MG tablet Take as directed by anticoagulation clinic  50 tablet  3    Past Medical History  Diagnosis Date  . Malleolar fracture   . Hypertension   . Dementia     mild  . Dyslipidemia   . Glaucoma(365)   . Anemia   . Cardiomyopathy     presumed ischemic in the past with an EF of 35%. The most recent EF in 03-2008, however, was 55 -60%  . Chronic renal  insufficiency   . Bradycardia   . Paroxysmal atrial fibrillation   . Arthritis     "knees especially"    Past Surgical History  Procedure Date  . Hemorrhoid surgery   . Insert / replace / remove pacemaker 09/17/11    initial placement  . Insert / replace / remove pacemaker 09/18/11  . Abdominal hysterectomy   . Tonsillectomy and adenoidectomy   . Fracture surgery ~ 2007    RLE  . Cardioversion 07/2011  . Eye surgery 2002    "blood clot behind eye"    ROS: As stated in the HPI and negative for all other systems.  PHYSICAL EXAM BP 142/100  Pulse 80  Ht 5\' 2"  (1.575 m)  Wt 148 lb 6.4 oz (67.314 kg)  BMI 27.14 kg/m2 GEN:  No distress NECK:  No jugular venous distention at 45 degrees, waveform within normal limits, carotid upstroke brisk and symmetric, no bruits, no thyromegaly LUNGS:  Clear to auscultation bilaterally BACK:  No CVA tenderness CHEST:  Well healed pacemaker pocket no pain or tenderness HEART:  S1 and S2 within normal limits, no S3, no clicks, no rubs, soft apical systolic murmur ABD:  Positive bowel sounds normal in frequency in pitch, no bruits, no rebound,  no guarding, no midline pulsatile mass or hepatomeally EXT:  2 plus pulses throughout, mild edema, no cyanosis no clubbing  ASSESSMENT AND PLAN  CARDIOMYOPATHY -  She seems to be euvolemic. At this point, no change in therapy is indicated. We have reviewed salt and fluid restrictions. No further cardiovascular testing is indicated.   HYPERTENSION -  The blood pressure is at target on her home readings.  I reviewed these. No change in medications is indicated. We will continue with therapeutic lifestyle changes (TLC).  Tachycardia-bradycardia syndrome -  She's had no symptomatic tachyarrhythmias. She's up-to-date pacemaker followup.  The pain over the pacer site has gone away.

## 2012-09-22 IMAGING — CR DG CHEST 2V
2 series · 2 of 2 positions shown · non-contrast
Comparison: September 18, 2011

CLINICAL DATA: Status post pacemaker placement

CHEST - 2 VIEW

[w chest pa]
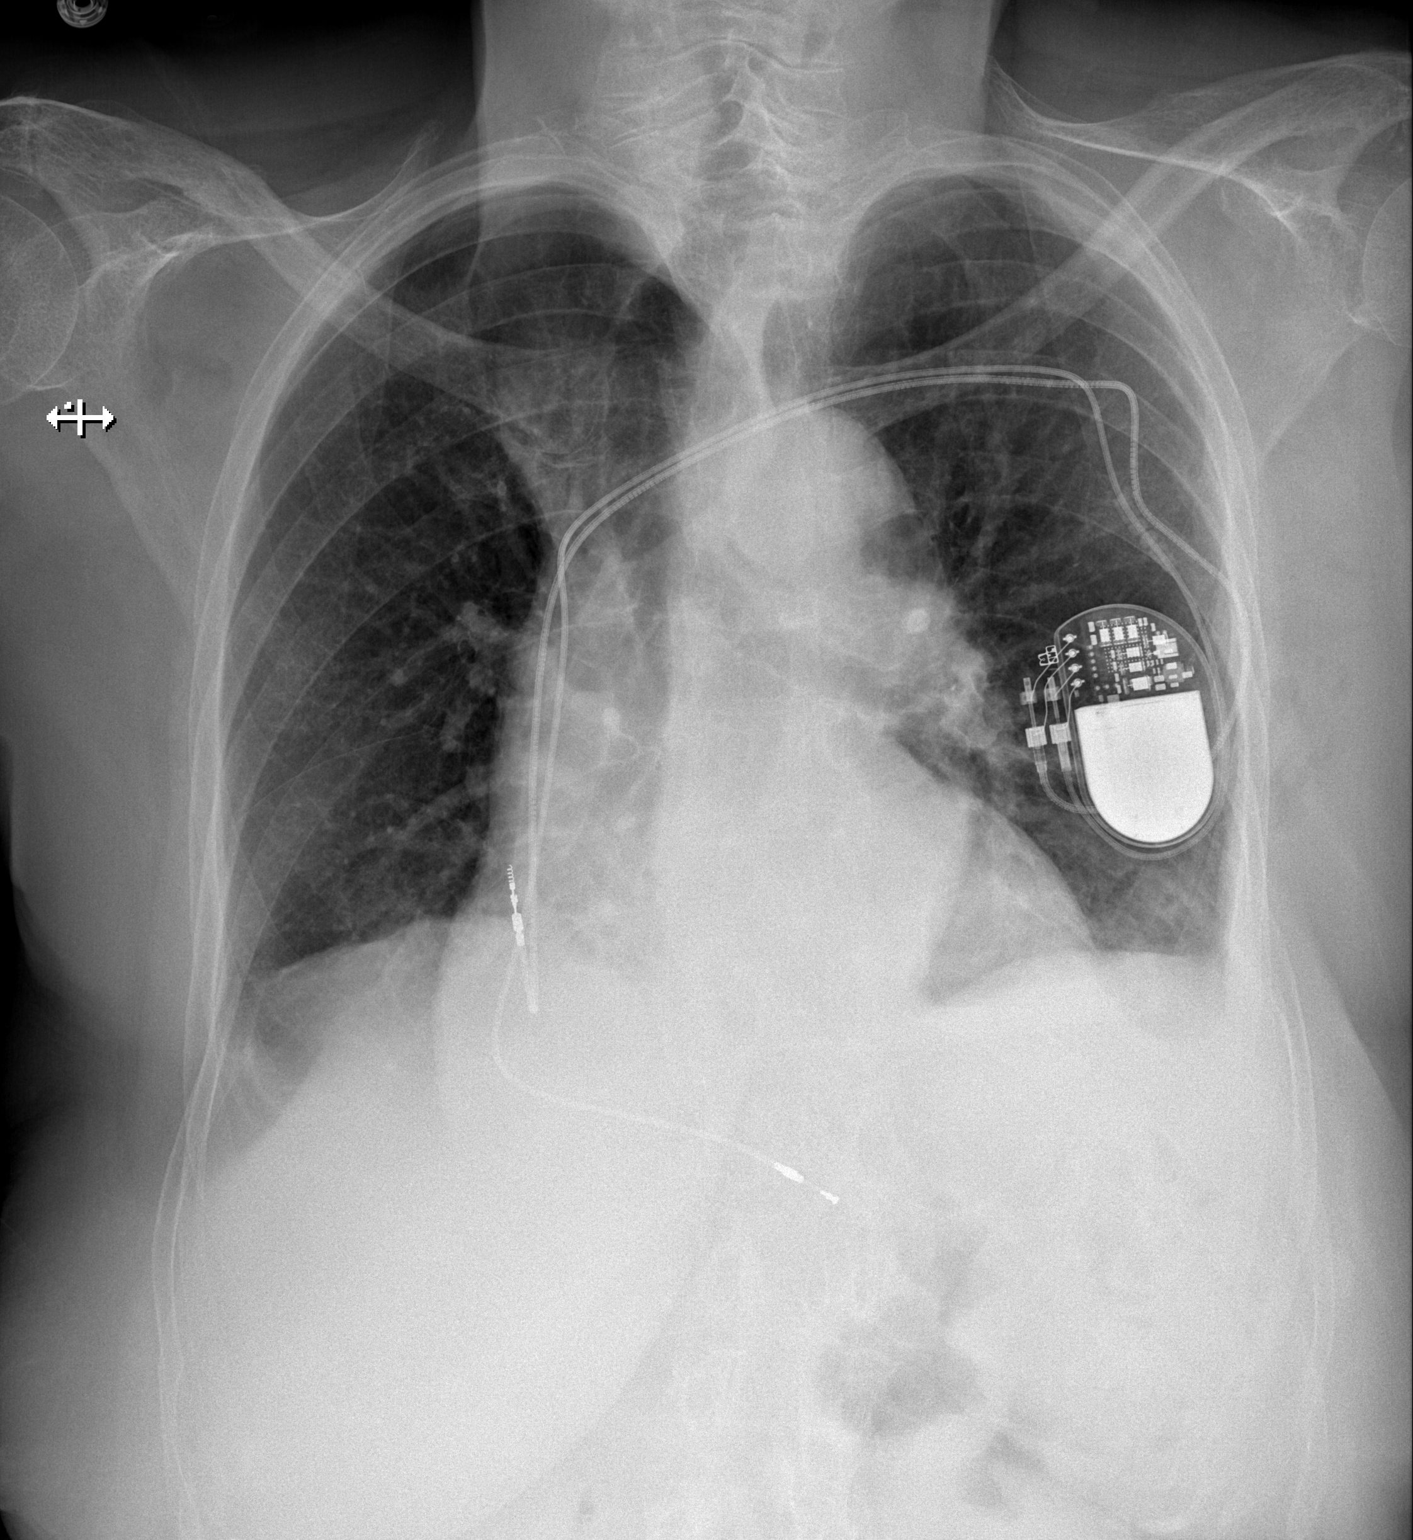

[w chest lat]
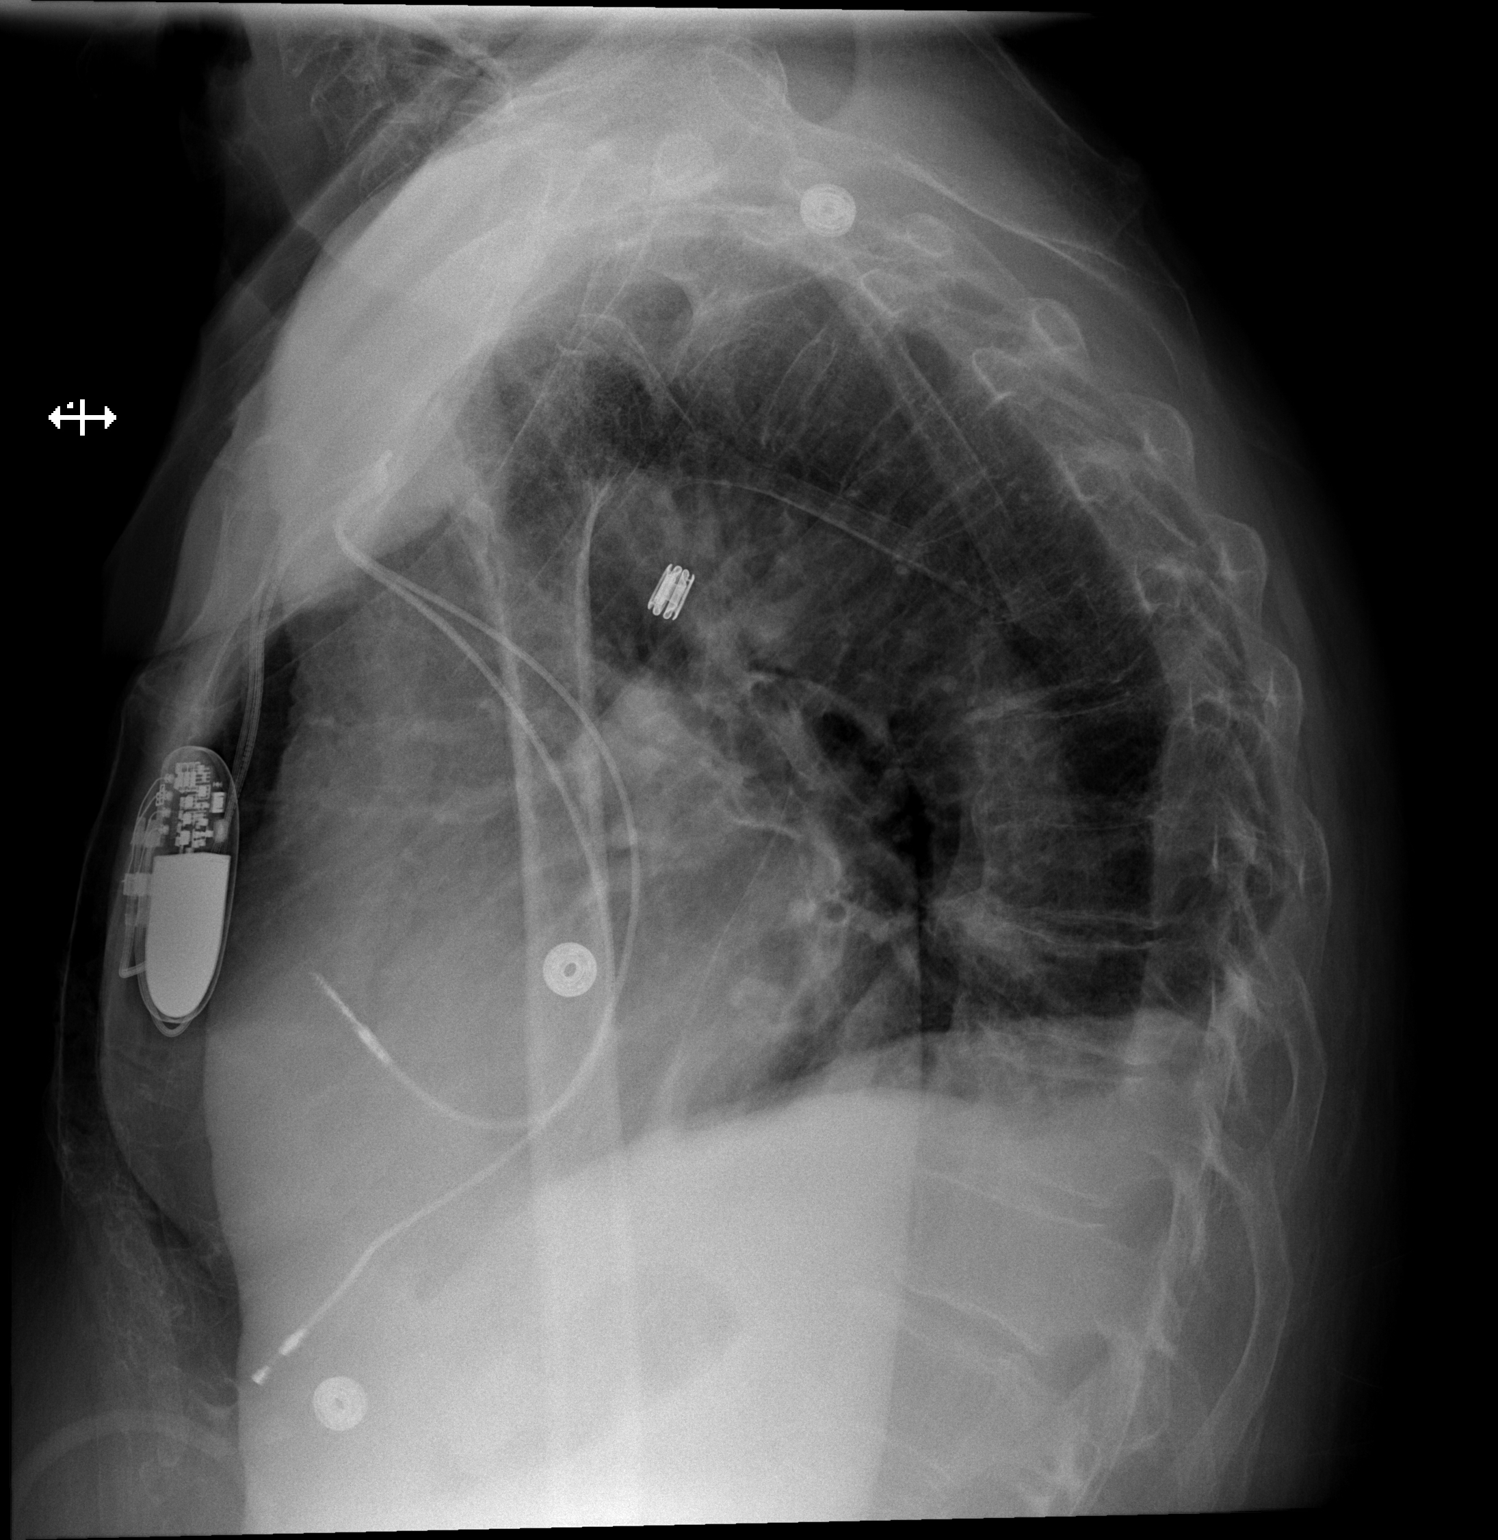

[2 of 2 positions shown; findings below may reference images not displayed]

FINDINGS: Pacemaker leads are intact and in good position over the
right atrium and right ventricle.  Cardiomegaly is unchanged.
There is pulmonary vascular cephalization without frank edema,
stable.  A small left pleural effusion is unchanged.
IMPRESSION: Pacemaker leads are in good position over the right atrium and
right ventricle.

Cardiomegaly, pulmonary vascular cephalization, small left effusion
are unchanged.

## 2012-10-03 ENCOUNTER — Other Ambulatory Visit: Payer: Self-pay | Admitting: Cardiology

## 2012-10-03 MED ORDER — WARFARIN SODIUM 2 MG PO TABS: ORAL_TABLET | ORAL | Status: DC

## 2012-10-11 ENCOUNTER — Other Ambulatory Visit: Payer: Self-pay

## 2012-10-11 MED ORDER — LISINOPRIL 5 MG PO TABS: 5.0000 mg | ORAL_TABLET | Freq: Every day | ORAL | Status: DC

## 2012-10-13 ENCOUNTER — Encounter: Payer: No Typology Code available for payment source | Admitting: Internal Medicine

## 2012-10-18 ENCOUNTER — Ambulatory Visit (INDEPENDENT_AMBULATORY_CARE_PROVIDER_SITE_OTHER): Payer: No Typology Code available for payment source | Admitting: *Deleted

## 2012-10-18 DIAGNOSIS — I4891 Unspecified atrial fibrillation: Secondary | ICD-10-CM

## 2012-10-18 LAB — POCT INR: INR: 2.4

## 2012-11-16 ENCOUNTER — Ambulatory Visit (INDEPENDENT_AMBULATORY_CARE_PROVIDER_SITE_OTHER): Payer: No Typology Code available for payment source | Admitting: *Deleted

## 2012-11-16 DIAGNOSIS — I4891 Unspecified atrial fibrillation: Secondary | ICD-10-CM

## 2012-11-16 LAB — POCT INR: INR: 2.8

## 2012-11-21 ENCOUNTER — Encounter: Payer: No Typology Code available for payment source | Admitting: *Deleted

## 2012-11-24 ENCOUNTER — Telehealth: Payer: Self-pay | Admitting: Cardiology

## 2012-11-24 NOTE — Telephone Encounter (Signed)
Fax Received From [facility], sent around to [facility]  11/24/12/KM

## 2012-11-29 ENCOUNTER — Encounter: Payer: Self-pay | Admitting: *Deleted

## 2012-12-12 ENCOUNTER — Other Ambulatory Visit: Payer: Self-pay | Admitting: *Deleted

## 2012-12-12 MED ORDER — ATORVASTATIN CALCIUM 10 MG PO TABS: 10.0000 mg | ORAL_TABLET | Freq: Every day | ORAL | Status: DC

## 2012-12-16 ENCOUNTER — Telehealth: Payer: Self-pay | Admitting: *Deleted

## 2012-12-16 NOTE — Telephone Encounter (Signed)
Per Dr Percival Spanish   No changes needed

## 2012-12-16 NOTE — Telephone Encounter (Signed)
Message copied by Shellia Cleverly on Fri Dec 16, 2012  3:56 PM ------      Message from: Minus Breeding      Created: Wed Nov 16, 2012  4:54 PM      Regarding: FW: B/P readings pt wants you to see       No change in therapy.  Excellent readings.             ----- Message -----         From: Fernande Boyden, RN         Sent: 11/16/2012   4:32 PM           To: Minus Breeding, MD, Ellwood Dense, RN      Subject: B/P readings pt wants you to see                         Ms Sheffler recorded her B/P for you        126/73      118/76      126/74      121/69      121/69            HR 64-72       ------

## 2012-12-19 ENCOUNTER — Ambulatory Visit (INDEPENDENT_AMBULATORY_CARE_PROVIDER_SITE_OTHER): Payer: No Typology Code available for payment source | Admitting: *Deleted

## 2012-12-19 ENCOUNTER — Ambulatory Visit (INDEPENDENT_AMBULATORY_CARE_PROVIDER_SITE_OTHER): Payer: No Typology Code available for payment source | Admitting: Cardiology

## 2012-12-19 ENCOUNTER — Other Ambulatory Visit: Payer: Self-pay

## 2012-12-19 ENCOUNTER — Encounter: Payer: Self-pay | Admitting: Cardiology

## 2012-12-19 VITALS — BP 138/90 | HR 81 | Ht 62.0 in | Wt 159.8 lb

## 2012-12-19 DIAGNOSIS — I4891 Unspecified atrial fibrillation: Secondary | ICD-10-CM

## 2012-12-19 DIAGNOSIS — I509 Heart failure, unspecified: Secondary | ICD-10-CM

## 2012-12-19 DIAGNOSIS — I428 Other cardiomyopathies: Secondary | ICD-10-CM

## 2012-12-19 LAB — PACEMAKER DEVICE OBSERVATION
AL AMPLITUDE: 1.4 mv
AL IMPEDENCE PM: 361 Ohm
AL THRESHOLD: 0.5 V
ATRIAL PACING PM: 97
BAMS-0001: 150 {beats}/min
BATTERY VOLTAGE: 2.78 V
RV LEAD AMPLITUDE: 15.67 mv
RV LEAD IMPEDENCE PM: 800 Ohm
RV LEAD THRESHOLD: 0.5 V
VENTRICULAR PACING PM: 0

## 2012-12-19 LAB — COMPREHENSIVE METABOLIC PANEL
ALT: 15 U/L (ref 0–35)
AST: 17 U/L (ref 0–37)
Albumin: 3.4 g/dL — ABNORMAL LOW (ref 3.5–5.2)
Alkaline Phosphatase: 59 U/L (ref 39–117)
BUN: 37 mg/dL — ABNORMAL HIGH (ref 6–23)
CO2: 33 mEq/L — ABNORMAL HIGH (ref 19–32)
Calcium: 9.7 mg/dL (ref 8.4–10.5)
Chloride: 98 mEq/L (ref 96–112)
Creatinine, Ser: 1.4 mg/dL — ABNORMAL HIGH (ref 0.4–1.2)
GFR: 44.96 mL/min — ABNORMAL LOW (ref >60.00)
Glucose, Bld: 107 mg/dL — ABNORMAL HIGH (ref 70–99)
Potassium: 3.8 mEq/L (ref 3.5–5.1)
Sodium: 141 mEq/L (ref 135–145)
Total Bilirubin: 1.1 mg/dL (ref 0.3–1.2)
Total Protein: 7.4 g/dL (ref 6.0–8.3)

## 2012-12-19 LAB — TSH: TSH: 2.07 u[IU]/mL (ref 0.35–5.50)

## 2012-12-19 LAB — POCT INR: INR: 2.8

## 2012-12-19 NOTE — Progress Notes (Signed)
PPM 

## 2012-12-19 NOTE — Progress Notes (Signed)
HPI The patient presents after followup of had tachybradycardia syndrome.  Since I last saw her she has done OK.  The patient denies any new symptoms such as chest discomfort, neck or arm discomfort. There has been no new shortness of breath, PND or orthopnea. There have been no reported palpitations, presyncope or syncope.  Her blood pressure at home has been well controlled.   No Known Allergies  Current Outpatient Prescriptions  Medication Sig Dispense Refill  . amiodarone (PACERONE) 200 MG tablet Take 1 tablet (200 mg total) by mouth daily. Take Amiodarone 5 days per week.  30 tablet  11  . atorvastatin (LIPITOR) 10 MG tablet Take 1 tablet (10 mg total) by mouth daily.  30 tablet  11  . furosemide (LASIX) 80 MG tablet Take 1 tablet (80 mg total) by mouth daily.  30 tablet  11  . lisinopril (PRINIVIL,ZESTRIL) 5 MG tablet Take 1 tablet (5 mg total) by mouth daily.  30 tablet  5  . Multiple Vitamin (MULTIVITAMIN) tablet Take 1 tablet by mouth daily.        . Nutritional Supplements (MELATONIN PO) Take 1 capsule by mouth at bedtime as needed. For sleep.       . potassium chloride SA (K-DUR,KLOR-CON) 20 MEQ tablet Take 20 mEq by mouth daily.      . travoprost, benzalkonium, (TRAVATAN) 0.004 % ophthalmic solution Place 1 drop into both eyes at bedtime.  2.5 mL  2  . warfarin (COUMADIN) 2 MG tablet Take as directed by anticoagulation clinic  50 tablet  3   No current facility-administered medications for this visit.    Past Medical History  Diagnosis Date  . Malleolar fracture   . Hypertension   . Dementia     mild  . Dyslipidemia   . Glaucoma(365)   . Anemia   . Cardiomyopathy     presumed ischemic in the past with an EF of 35%. The most recent EF in 03-2008, however, was 55 -60%  . Chronic renal insufficiency   . Bradycardia   . Paroxysmal atrial fibrillation   . Arthritis     "knees especially"    Past Surgical History  Procedure Laterality Date  . Hemorrhoid surgery    .  Insert / replace / remove pacemaker  09/17/11    initial placement  . Insert / replace / remove pacemaker  09/18/11  . Abdominal hysterectomy    . Tonsillectomy and adenoidectomy    . Fracture surgery  ~ 2007    RLE  . Cardioversion  07/2011  . Eye surgery  2002    "blood clot behind eye"    ROS: As stated in the HPI and negative for all other systems.  PHYSICAL EXAM BP 138/90  Pulse 81  Ht 5\' 2"  (1.575 m)  Wt 159 lb 12.8 oz (72.485 kg)  BMI 29.22 kg/m2 GEN:  No distress NECK:  No jugular venous distention at 45 degrees, waveform within normal limits, carotid upstroke brisk and symmetric, no bruits, no thyromegaly LUNGS:  Clear to auscultation bilaterally BACK:  No CVA tenderness CHEST:  Well healed pacemaker pocket. HEART:  S1 and S2 within normal limits, no S3, no clicks, no rubs, soft apical systolic murmur ABD:  Positive bowel sounds normal in frequency in pitch, no bruits, no rebound, no guarding, no midline pulsatile mass or hepatomeally EXT:  2 plus pulses throughout, mild edema, no cyanosis no clubbing  EKG:  Atrial paced rhythm, rate 81, ectopic complex, LBBB.  12/19/2012  ASSESSMENT AND PLAN  CARDIOMYOPATHY -  She seems to be euvolemic. At this point, no change in therapy is indicated. We have reviewed salt and fluid restrictions. No further cardiovascular testing is indicated.   HYPERTENSION -  The blood pressure is at target on her home readings.  I reviewed these again today. No change in medications is indicated. We will continue with therapeutic lifestyle changes (TLC).  Tachycardia-bradycardia syndrome -  She's had no symptomatic tachyarrhythmias. She's up-to-date pacemaker followup.  She remains on amiodarone and I will check TSH and a comprehensive metabolic profile today.

## 2012-12-19 NOTE — Patient Instructions (Addendum)
The current medical regimen is effective;  continue present plan and medications.  Please have blood work today as discussed. (CMP AND TSH)  Follow up in 3 months with Dr Percival Spanish.  You will receive a letter in the mail 2 months before you are due.  Please call us when you receive this letter to schedule your follow up appointment.

## 2012-12-20 ENCOUNTER — Other Ambulatory Visit: Payer: Self-pay | Admitting: Cardiology

## 2012-12-22 ENCOUNTER — Other Ambulatory Visit: Payer: Self-pay | Admitting: Cardiology

## 2013-01-13 ENCOUNTER — Other Ambulatory Visit: Payer: Self-pay | Admitting: Cardiology

## 2013-01-16 ENCOUNTER — Ambulatory Visit (INDEPENDENT_AMBULATORY_CARE_PROVIDER_SITE_OTHER): Payer: No Typology Code available for payment source | Admitting: *Deleted

## 2013-01-16 DIAGNOSIS — I4891 Unspecified atrial fibrillation: Secondary | ICD-10-CM

## 2013-01-16 LAB — POCT INR: INR: 8

## 2013-01-16 LAB — PROTIME-INR
INR: 8.1 ratio (ref 0.8–1.0)
Prothrombin Time: 82.1 s (ref 10.2–12.4)

## 2013-01-20 ENCOUNTER — Ambulatory Visit (INDEPENDENT_AMBULATORY_CARE_PROVIDER_SITE_OTHER): Payer: No Typology Code available for payment source | Admitting: Pharmacist

## 2013-01-20 DIAGNOSIS — I4891 Unspecified atrial fibrillation: Secondary | ICD-10-CM

## 2013-01-20 LAB — POCT INR: INR: 3.8

## 2013-01-27 ENCOUNTER — Ambulatory Visit (INDEPENDENT_AMBULATORY_CARE_PROVIDER_SITE_OTHER): Payer: No Typology Code available for payment source | Admitting: *Deleted

## 2013-01-27 DIAGNOSIS — I4891 Unspecified atrial fibrillation: Secondary | ICD-10-CM

## 2013-01-27 LAB — POCT INR: INR: 2.8

## 2013-02-10 ENCOUNTER — Encounter: Payer: Self-pay | Admitting: Internal Medicine

## 2013-02-10 ENCOUNTER — Ambulatory Visit (INDEPENDENT_AMBULATORY_CARE_PROVIDER_SITE_OTHER): Payer: No Typology Code available for payment source | Admitting: *Deleted

## 2013-02-10 ENCOUNTER — Ambulatory Visit (INDEPENDENT_AMBULATORY_CARE_PROVIDER_SITE_OTHER): Payer: No Typology Code available for payment source | Admitting: Internal Medicine

## 2013-02-10 VITALS — BP 142/88 | HR 76 | Wt 152.0 lb

## 2013-02-10 DIAGNOSIS — I495 Sick sinus syndrome: Secondary | ICD-10-CM

## 2013-02-10 DIAGNOSIS — I4891 Unspecified atrial fibrillation: Secondary | ICD-10-CM

## 2013-02-10 DIAGNOSIS — Z950 Presence of cardiac pacemaker: Secondary | ICD-10-CM

## 2013-02-10 LAB — PACEMAKER DEVICE OBSERVATION
AL AMPLITUDE: 2 mv
AL IMPEDENCE PM: 371 Ohm
AL THRESHOLD: 0.5 V
ATRIAL PACING PM: 88
BAMS-0001: 150 {beats}/min
BATTERY VOLTAGE: 2.78 V
RV LEAD AMPLITUDE: 15.67 mv
RV LEAD IMPEDENCE PM: 765 Ohm
RV LEAD THRESHOLD: 0.5 V
VENTRICULAR PACING PM: 0

## 2013-02-10 LAB — POCT INR: INR: 3.5

## 2013-02-10 MED ORDER — AMIODARONE HCL 200 MG PO TABS: 100.0000 mg | ORAL_TABLET | Freq: Every day | ORAL | Status: DC

## 2013-02-10 NOTE — Assessment & Plan Note (Signed)
The patient's device was interrogated.  The information was reviewed. No changes were made in the programming.    

## 2013-02-10 NOTE — Progress Notes (Signed)
Patient Care Team: Chriss Czar as PCP - General (Family Medicine)   HPI  [name] is a 79 y.o. female Seen  In followup for pacemaker implanted for tachybradycardia syndrome January 0000000 complicated by atrial lead dislodgment. She had a new lead placed thereafter. She also has atrial fibrillation and is on amiodarone.  No significant atrial fibrillation. He does have problems with constipation for. She's had no nausea orthostasis or cough. Amiodarone labs normal in April     Past Medical History  Diagnosis Date  . Malleolar fracture   . Hypertension   . Dementia     mild  . Dyslipidemia   . Glaucoma(365)   . Anemia   . Cardiomyopathy     presumed ischemic in the past with an EF of 35%. The most recent EF in 03-2008, however, was 55 -60%  . Chronic renal insufficiency   . Bradycardia   . Paroxysmal atrial fibrillation   . Arthritis     "knees especially"    Past Surgical History  Procedure Laterality Date  . Hemorrhoid surgery    . Insert / replace / remove pacemaker  09/17/11    initial placement  . Insert / replace / remove pacemaker  09/18/11  . Abdominal hysterectomy    . Tonsillectomy and adenoidectomy    . Fracture surgery  ~ 2007    RLE  . Cardioversion  07/2011  . Eye surgery  2002    "blood clot behind eye"    Current Outpatient Prescriptions  Medication Sig Dispense Refill  . amiodarone (PACERONE) 200 MG tablet Take 1 tablet (200 mg total) by mouth daily. Take Amiodarone 5 days per week.  30 tablet  11  . atorvastatin (LIPITOR) 10 MG tablet Take 1 tablet (10 mg total) by mouth daily.  30 tablet  11  . furosemide (LASIX) 80 MG tablet TAKE ONE TABLET BY MOUTH ONE TIME DAILY  30 tablet  0  . lisinopril (PRINIVIL,ZESTRIL) 5 MG tablet Take 1 tablet (5 mg total) by mouth daily.  30 tablet  5  . Multiple Vitamin (MULTIVITAMIN) tablet Take 1 tablet by mouth daily.        . Nutritional Supplements (MELATONIN PO) Take 1 capsule by mouth at bedtime as needed.  For sleep.       Marland Kitchen Olopatadine HCl (PATADAY) 0.2 % SOLN Apply to eye as directed.      . potassium chloride SA (K-DUR,KLOR-CON) 20 MEQ tablet TAKE ONE TABLET BY MOUTH ONE TIME DAILY  30 tablet  11  . travoprost, benzalkonium, (TRAVATAN) 0.004 % ophthalmic solution Place 1 drop into both eyes at bedtime.  2.5 mL  2  . warfarin (COUMADIN) 2 MG tablet Take as directed by anticoagulation clinic  50 tablet  3   No current facility-administered medications for this visit.    No Known Allergies  Review of Systems negative except from HPI and PMH  Physical Exam BP 142/88  Pulse 76  Wt 152 lb (68.947 kg)  BMI 27.79 kg/m2  Device pocket well healed; without hematoma or erythema  Well developed and nourished in no acute distress HENT normal Neck supple with JVP-flat Clear Regular rate and rhythm, no murmurs or gallops Abd-soft with active BS No Clubbing cyanosis edema Skin-warm and dry A & Oriented  Grossly normal sensory and motor function  Electrocardiogram demonstrates atrial pacing with intrinsic conduction and left bundle branch block Assessment and  Plan

## 2013-02-10 NOTE — Assessment & Plan Note (Signed)
Infrequently recurring atrial fibrillation. We will change her dose from 1000 mg--700 mg a week

## 2013-02-10 NOTE — Patient Instructions (Addendum)
Your physician wants you to follow-up in: Nov with Margaret Pyle in Nov and 12 months with Dr Gari Crown will receive a reminder letter in the mail two months in advance. If you don't receive a letter, please call our office to schedule the follow-up appointment.   Remote monitoring is used to monitor your Pacemaker of ICD from home. This monitoring reduces the number of office visits required to check your device to one time per year. It allows [place] to keep an eye on the functioning of your device to ensure it is working properly. You are scheduled for a device check from home on 05/16/13 You may send your transmission at any time that day. If you have a wireless device, the transmission will be sent automatically. After your physician reviews your transmission, you will receive a postcard with your next transmission date.  Your physician has recommended you make the following change in your medication:  1) Decrease Amiodarone to 100mg  daily

## 2013-02-20 ENCOUNTER — Other Ambulatory Visit: Payer: Self-pay

## 2013-02-20 MED ORDER — FUROSEMIDE 80 MG PO TABS: ORAL_TABLET | ORAL | Status: DC

## 2013-02-20 NOTE — Telephone Encounter (Signed)
..   Requested Prescriptions   Pending Prescriptions Disp Refills  . furosemide (LASIX) 80 MG tablet 30 tablet 6    Sig: TAKE ONE TABLET BY MOUTH ONE TIME DAILY

## 2013-02-24 ENCOUNTER — Ambulatory Visit (INDEPENDENT_AMBULATORY_CARE_PROVIDER_SITE_OTHER): Payer: No Typology Code available for payment source

## 2013-02-24 DIAGNOSIS — I4891 Unspecified atrial fibrillation: Secondary | ICD-10-CM

## 2013-02-24 LAB — POCT INR: INR: 2.6

## 2013-03-21 ENCOUNTER — Encounter: Payer: Self-pay | Admitting: Cardiology

## 2013-03-21 ENCOUNTER — Ambulatory Visit (INDEPENDENT_AMBULATORY_CARE_PROVIDER_SITE_OTHER): Payer: No Typology Code available for payment source | Admitting: Cardiology

## 2013-03-21 ENCOUNTER — Ambulatory Visit (INDEPENDENT_AMBULATORY_CARE_PROVIDER_SITE_OTHER): Payer: No Typology Code available for payment source | Admitting: *Deleted

## 2013-03-21 VITALS — BP 146/82 | HR 72 | Ht 62.0 in | Wt 152.0 lb

## 2013-03-21 DIAGNOSIS — I509 Heart failure, unspecified: Secondary | ICD-10-CM

## 2013-03-21 DIAGNOSIS — I4891 Unspecified atrial fibrillation: Secondary | ICD-10-CM

## 2013-03-21 DIAGNOSIS — I428 Other cardiomyopathies: Secondary | ICD-10-CM

## 2013-03-21 DIAGNOSIS — N259 Disorder resulting from impaired renal tubular function, unspecified: Secondary | ICD-10-CM

## 2013-03-21 LAB — POCT INR: INR: 1.5

## 2013-03-21 NOTE — Progress Notes (Signed)
HPI The patient presents after followup of had tachybradycardia syndrome.  Since I last saw her she has done OK. Dr. Caryl Comes noted that she's having rare atrial fibrillation on her last pacemaker check and he reduced her amiodarone. The patient denies any new symptoms such as chest discomfort, neck or arm discomfort. There has been no new shortness of breath, PND or orthopnea. There have been no reported palpitations, presyncope or syncope.     No Known Allergies  Current Outpatient Prescriptions  Medication Sig Dispense Refill  . amiodarone (PACERONE) 200 MG tablet Take 0.5 tablets (100 mg total) by mouth daily. Take Amiodarone 5 days per week.  30 tablet  11  . atorvastatin (LIPITOR) 10 MG tablet Take 1 tablet (10 mg total) by mouth daily.  30 tablet  11  . furosemide (LASIX) 80 MG tablet TAKE ONE TABLET BY MOUTH ONE TIME DAILY  30 tablet  6  . lisinopril (PRINIVIL,ZESTRIL) 5 MG tablet Take 1 tablet (5 mg total) by mouth daily.  30 tablet  5  . Multiple Vitamin (MULTIVITAMIN) tablet Take 1 tablet by mouth daily.        . Nutritional Supplements (MELATONIN PO) Take 1 capsule by mouth at bedtime as needed. For sleep.       Marland Kitchen Olopatadine HCl (PATADAY) 0.2 % SOLN Apply to eye as directed.      . potassium chloride SA (K-DUR,KLOR-CON) 20 MEQ tablet TAKE ONE TABLET BY MOUTH ONE TIME DAILY  30 tablet  11  . travoprost, benzalkonium, (TRAVATAN) 0.004 % ophthalmic solution Place 1 drop into both eyes at bedtime.  2.5 mL  2  . warfarin (COUMADIN) 2 MG tablet Take as directed by anticoagulation clinic  50 tablet  3   No current facility-administered medications for this visit.    Past Medical History  Diagnosis Date  . Malleolar fracture   . Hypertension   . Dementia     mild  . Dyslipidemia   . Glaucoma   . Anemia   . Cardiomyopathy     presumed ischemic in the past with an EF of 35%. The most recent EF in 03-2008, however, was 55 -60%  . Chronic renal insufficiency   . Bradycardia   .  Paroxysmal atrial fibrillation   . Arthritis     "knees especially"    Past Surgical History  Procedure Laterality Date  . Hemorrhoid surgery    . Insert / replace / remove pacemaker  09/17/11    initial placement  . Insert / replace / remove pacemaker  09/18/11  . Abdominal hysterectomy    . Tonsillectomy and adenoidectomy    . Fracture surgery  ~ 2007    RLE  . Cardioversion  07/2011  . Eye surgery  2002    "blood clot behind eye"    ROS: As stated in the HPI and negative for all other systems.  PHYSICAL EXAM BP 146/82  Pulse 72  Ht 5\' 2"  (1.575 m)  Wt 152 lb (68.947 kg)  BMI 27.79 kg/m2 GEN:  No distress NECK:  No jugular venous distention at 45 degrees, waveform within normal limits, carotid upstroke brisk and symmetric, no bruits, no thyromegaly LUNGS:  Clear to auscultation bilaterally BACK:  No CVA tenderness CHEST:  Well healed pacemaker pocket. HEART:  S1 and S2 within normal limits, no S3, no clicks, no rubs, soft apical systolic murmur ABD:  Positive bowel sounds normal in frequency in pitch, no bruits, no rebound, no guarding, no midline pulsatile  mass or hepatomeally EXT:  2 plus pulses throughout, mild edema, no cyanosis no clubbing   ASSESSMENT AND PLAN  CARDIOMYOPATHY -  She seems to be euvolemic. At this point, no change in therapy is indicated.   HYPERTENSION -  The blood pressure is at target. No change in medications is indicated. We will continue with therapeutic lifestyle changes (TLC).  Tachycardia-bradycardia syndrome -  She's had no symptomatic tachyarrhythmias. She remains on amiodarone and is up-to-date with laboratory followup.

## 2013-03-21 NOTE — Patient Instructions (Addendum)
The current medical regimen is effective;  continue present plan and medications.  Follow up in 4 months with Dr Hochrein.  You will receive a letter in the mail 2 months before you are due.  Please call us when you receive this letter to schedule your follow up appointment.  

## 2013-04-04 ENCOUNTER — Ambulatory Visit (INDEPENDENT_AMBULATORY_CARE_PROVIDER_SITE_OTHER): Payer: No Typology Code available for payment source | Admitting: Pharmacist

## 2013-04-04 DIAGNOSIS — I4891 Unspecified atrial fibrillation: Secondary | ICD-10-CM

## 2013-04-04 LAB — POCT INR: INR: 1.6

## 2013-04-05 ENCOUNTER — Other Ambulatory Visit: Payer: Self-pay | Admitting: *Deleted

## 2013-04-05 MED ORDER — LISINOPRIL 5 MG PO TABS: 5.0000 mg | ORAL_TABLET | Freq: Every day | ORAL | Status: DC

## 2013-04-05 NOTE — Telephone Encounter (Signed)
Refill sent.

## 2013-04-18 ENCOUNTER — Ambulatory Visit (INDEPENDENT_AMBULATORY_CARE_PROVIDER_SITE_OTHER): Payer: No Typology Code available for payment source | Admitting: *Deleted

## 2013-04-18 DIAGNOSIS — I4891 Unspecified atrial fibrillation: Secondary | ICD-10-CM

## 2013-04-18 LAB — POCT INR: INR: 1.9

## 2013-04-19 ENCOUNTER — Other Ambulatory Visit: Payer: Self-pay

## 2013-04-21 ENCOUNTER — Other Ambulatory Visit: Payer: Self-pay | Admitting: Pharmacist

## 2013-04-21 MED ORDER — WARFARIN SODIUM 2 MG PO TABS: ORAL_TABLET | ORAL | Status: DC

## 2013-05-09 ENCOUNTER — Ambulatory Visit (INDEPENDENT_AMBULATORY_CARE_PROVIDER_SITE_OTHER): Payer: No Typology Code available for payment source | Admitting: Pharmacist

## 2013-05-09 DIAGNOSIS — I4891 Unspecified atrial fibrillation: Secondary | ICD-10-CM

## 2013-05-09 LAB — POCT INR: INR: 2

## 2013-05-16 ENCOUNTER — Encounter: Payer: No Typology Code available for payment source | Admitting: *Deleted

## 2013-05-24 ENCOUNTER — Encounter: Payer: Self-pay | Admitting: *Deleted

## 2013-05-30 ENCOUNTER — Encounter: Payer: Self-pay | Admitting: Internal Medicine

## 2013-05-30 ENCOUNTER — Ambulatory Visit (INDEPENDENT_AMBULATORY_CARE_PROVIDER_SITE_OTHER): Payer: No Typology Code available for payment source | Admitting: *Deleted

## 2013-05-30 ENCOUNTER — Ambulatory Visit (INDEPENDENT_AMBULATORY_CARE_PROVIDER_SITE_OTHER): Payer: No Typology Code available for payment source

## 2013-05-30 DIAGNOSIS — Z5189 Encounter for other specified aftercare: Secondary | ICD-10-CM

## 2013-05-30 DIAGNOSIS — Z950 Presence of cardiac pacemaker: Secondary | ICD-10-CM

## 2013-05-30 DIAGNOSIS — I4891 Unspecified atrial fibrillation: Secondary | ICD-10-CM

## 2013-05-30 DIAGNOSIS — T827XXD Infection and inflammatory reaction due to other cardiac and vascular devices, implants and grafts, subsequent encounter: Secondary | ICD-10-CM

## 2013-05-30 DIAGNOSIS — I495 Sick sinus syndrome: Secondary | ICD-10-CM

## 2013-05-30 LAB — POCT INR: INR: 1.6

## 2013-06-10 LAB — REMOTE PACEMAKER DEVICE
AL IMPEDENCE PM: 402 Ohm
AL THRESHOLD: 0.625 V
ATRIAL PACING PM: 94
BAMS-0001: 150 {beats}/min
BATTERY VOLTAGE: 2.78 V
RV LEAD AMPLITUDE: 22.4 mv
RV LEAD IMPEDENCE PM: 763 Ohm
RV LEAD THRESHOLD: 0.5 V
VENTRICULAR PACING PM: 0

## 2013-06-13 ENCOUNTER — Ambulatory Visit (INDEPENDENT_AMBULATORY_CARE_PROVIDER_SITE_OTHER): Payer: No Typology Code available for payment source | Admitting: Pharmacist

## 2013-06-13 DIAGNOSIS — I4891 Unspecified atrial fibrillation: Secondary | ICD-10-CM

## 2013-06-13 DIAGNOSIS — Z7901 Long term (current) use of anticoagulants: Secondary | ICD-10-CM

## 2013-06-13 LAB — POCT INR: INR: 1.8

## 2013-06-19 ENCOUNTER — Encounter: Payer: Self-pay | Admitting: *Deleted

## 2013-06-27 ENCOUNTER — Ambulatory Visit (INDEPENDENT_AMBULATORY_CARE_PROVIDER_SITE_OTHER): Payer: No Typology Code available for payment source | Admitting: Pharmacist

## 2013-06-27 DIAGNOSIS — I4891 Unspecified atrial fibrillation: Secondary | ICD-10-CM

## 2013-06-27 LAB — POCT INR: INR: 2.7

## 2013-07-11 ENCOUNTER — Ambulatory Visit (INDEPENDENT_AMBULATORY_CARE_PROVIDER_SITE_OTHER): Payer: No Typology Code available for payment source | Admitting: *Deleted

## 2013-07-11 DIAGNOSIS — I4891 Unspecified atrial fibrillation: Secondary | ICD-10-CM

## 2013-07-11 LAB — POCT INR: INR: 2.2

## 2013-07-20 ENCOUNTER — Other Ambulatory Visit: Payer: Self-pay

## 2013-08-02 ENCOUNTER — Other Ambulatory Visit: Payer: Self-pay

## 2013-08-02 DIAGNOSIS — I4891 Unspecified atrial fibrillation: Secondary | ICD-10-CM

## 2013-08-02 DIAGNOSIS — I495 Sick sinus syndrome: Secondary | ICD-10-CM

## 2013-08-02 DIAGNOSIS — Z950 Presence of cardiac pacemaker: Secondary | ICD-10-CM

## 2013-08-02 MED ORDER — AMIODARONE HCL 200 MG PO TABS: 100.0000 mg | ORAL_TABLET | Freq: Every day | ORAL | Status: DC

## 2013-08-08 ENCOUNTER — Ambulatory Visit (INDEPENDENT_AMBULATORY_CARE_PROVIDER_SITE_OTHER): Payer: No Typology Code available for payment source | Admitting: Physician Assistant

## 2013-08-08 ENCOUNTER — Telehealth: Payer: Self-pay | Admitting: *Deleted

## 2013-08-08 ENCOUNTER — Ambulatory Visit (INDEPENDENT_AMBULATORY_CARE_PROVIDER_SITE_OTHER): Payer: No Typology Code available for payment source | Admitting: *Deleted

## 2013-08-08 ENCOUNTER — Encounter: Payer: Self-pay | Admitting: Physician Assistant

## 2013-08-08 VITALS — BP 130/74 | HR 74 | Ht 62.0 in

## 2013-08-08 DIAGNOSIS — E785 Hyperlipidemia, unspecified: Secondary | ICD-10-CM

## 2013-08-08 DIAGNOSIS — I10 Essential (primary) hypertension: Secondary | ICD-10-CM

## 2013-08-08 DIAGNOSIS — Z950 Presence of cardiac pacemaker: Secondary | ICD-10-CM

## 2013-08-08 DIAGNOSIS — I428 Other cardiomyopathies: Secondary | ICD-10-CM

## 2013-08-08 DIAGNOSIS — I495 Sick sinus syndrome: Secondary | ICD-10-CM

## 2013-08-08 DIAGNOSIS — N189 Chronic kidney disease, unspecified: Secondary | ICD-10-CM

## 2013-08-08 DIAGNOSIS — I4891 Unspecified atrial fibrillation: Secondary | ICD-10-CM

## 2013-08-08 LAB — BASIC METABOLIC PANEL
BUN: 30 mg/dL — ABNORMAL HIGH (ref 6–23)
CO2: 33 mEq/L — ABNORMAL HIGH (ref 19–32)
Calcium: 10 mg/dL (ref 8.4–10.5)
Chloride: 102 mEq/L (ref 96–112)
Creatinine, Ser: 1.3 mg/dL — ABNORMAL HIGH (ref 0.4–1.2)
GFR: 52.01 mL/min — ABNORMAL LOW (ref >60.00)
Glucose, Bld: 94 mg/dL (ref 70–99)
Potassium: 3.8 mEq/L (ref 3.5–5.1)
Sodium: 142 mEq/L (ref 135–145)

## 2013-08-08 LAB — HEPATIC FUNCTION PANEL
ALT: 21 U/L (ref 0–35)
AST: 20 U/L (ref 0–37)
Albumin: 4.1 g/dL (ref 3.5–5.2)
Alkaline Phosphatase: 55 U/L (ref 39–117)
Bilirubin, Direct: 0.1 mg/dL (ref 0.0–0.3)
Total Bilirubin: 1.4 mg/dL — ABNORMAL HIGH (ref 0.3–1.2)
Total Protein: 7 g/dL (ref 6.0–8.3)

## 2013-08-08 LAB — POCT INR: INR: 1.7

## 2013-08-08 LAB — TSH: TSH: 2.68 u[IU]/mL (ref 0.35–5.50)

## 2013-08-08 MED ORDER — AMIODARONE HCL 200 MG PO TABS: 100.0000 mg | ORAL_TABLET | Freq: Every day | ORAL | Status: DC

## 2013-08-08 NOTE — Telephone Encounter (Signed)
lmom K+ ok, creatinine stable, TSH, LFT ok, continue current tx plan

## 2013-08-08 NOTE — Patient Instructions (Signed)
LAB TODAY; BMET, TSH, LFT  Your physician wants you to follow-up in: 4 MONTHS WITH DR. Manheim. You will receive a reminder letter in the mail two months in advance. If you don't receive a letter, please call our office to schedule the follow-up appointment.  A REFILL FOR AMIODARONE WAS SENT IN TODAY TO [place], [place]

## 2013-08-08 NOTE — Progress Notes (Signed)
[address], [place], [place]  [address] Phone: [phone] Fax:  [phone]  Date:  08/08/2013   ID:  [name], DOB [birth year], MRN [id number]  PCP:  Kathleen Czar, MD  Cardiologist:  Dr. Minus Breeding   Electrophysiologist:  Dr. Virl Axe    History of Present Illness: [name] is a 79 y.o. female with a hx of atrial fibrillation, tachybradycardia syndrome, status post pacemaker, presumed ischemic cardiomyopathy (EF 35% in the past improved to normal in 2009; reduced to 20% in the setting of atrial fibrillation with RVR in 06/2011), CKD, HTN, HL. Echocardiogram at [facility] (06/2011): Moderate LVH, EF 20%, mild MR, moderate BAE, mild AI, mild TR.  She has remained on amiodarone. Last seen by Dr. Percival Spanish 03/2013.  She returns for follow up. She is overall doing well without chest pain or significant dyspnea. She denies orthopnea, PND or edema. She denies syncope palpitations.  Recent Labs: 12/19/2012: ALT 15; Creatinine 1.4*; Potassium 3.8; TSH 2.07   Wt Readings from Last 3 Encounters:  03/21/13 152 lb (68.947 kg)  02/10/13 152 lb (68.947 kg)  12/19/12 159 lb 12.8 oz (72.485 kg)     Past Medical History  Diagnosis Date  . Malleolar fracture   . Hypertension   . Dementia     mild  . Dyslipidemia   . Glaucoma   . Anemia   . Cardiomyopathy     presumed ischemic in the past with an EF of 35%. The most recent EF in 03-2008, however, was 55 -60%  . Chronic renal insufficiency   . Bradycardia   . Paroxysmal atrial fibrillation   . Arthritis     "knees especially"    Current Outpatient Prescriptions  Medication Sig Dispense Refill  . amiodarone (PACERONE) 200 MG tablet Take 0.5 tablets (100 mg total) by mouth daily. Take Amiodarone 5 days per week.  30 tablet  4  . atorvastatin (LIPITOR) 10 MG tablet Take 1 tablet (10 mg total) by mouth daily.  30 tablet  11  . furosemide (LASIX) 80 MG tablet TAKE ONE TABLET BY MOUTH ONE TIME DAILY  30 tablet  6    . lisinopril (PRINIVIL,ZESTRIL) 5 MG tablet Take 1 tablet (5 mg total) by mouth daily.  30 tablet  6  . Multiple Vitamin (MULTIVITAMIN) tablet Take 1 tablet by mouth daily.        . Nutritional Supplements (MELATONIN PO) Take 1 capsule by mouth at bedtime as needed. For sleep.       Marland Kitchen Olopatadine HCl (PATADAY) 0.2 % SOLN Apply to eye as directed.      . potassium chloride SA (K-DUR,KLOR-CON) 20 MEQ tablet TAKE ONE TABLET BY MOUTH ONE TIME DAILY  30 tablet  11  . travoprost, benzalkonium, (TRAVATAN) 0.004 % ophthalmic solution Place 1 drop into both eyes at bedtime.  2.5 mL  2  . warfarin (COUMADIN) 2 MG tablet Take as directed by anticoagulation clinic  50 tablet  3   No current facility-administered medications for this visit.    Allergies:   Review of patient's allergies indicates no known allergies.   Social History:  The patient  reports that she has never smoked. She has never used smokeless tobacco. She reports that she does not drink alcohol or use illicit drugs.   Family History:  The patient's family history includes Other in an other family member.   ROS:  Please see the history of present illness.     All other systems reviewed  and negative.   PHYSICAL EXAM: VS:  BP 130/74  Pulse 74  Ht 5\' 2"  (1.575 m) Well nourished, well developed, in no acute distress HEENT: normal Neck: no JVDat 90 Cardiac:  normal S1, S2; RRR; no murmur Lungs:  clear to auscultation bilaterally, no wheezing, rhonchi or rales Abd: soft, nontender, no hepatomegaly Ext: no edema Skin: warm and dry Neuro:  CNs 2-12 intact, no focal abnormalities noted  EKG:  Atrial paced, HR 74     ASSESSMENT AND PLAN:  1. Atrial Fibrillation: She remains in NSR. She remains on amiodarone and Coumadin. I will obtain follow up TSH and LFTs today. 2. Hypertension: Controlled. 3. Presumed Ischemic Cardiomyopathy: Volume stable. Check follow up basic metabolic panel. Continue current dose of ACE  inhibitor. 4. Hyperlipidemia: Continue statin. 5. Tachybradycardia Syndrome, Status Post Pacemaker: Follow up with Dr. Caryl Comes as planned. 6. Chronic Kidney Disease: Check basic metabolic panel today.  Signed, Richardson Dopp, PA-C  08/08/2013 10:49 AM  n

## 2013-08-29 ENCOUNTER — Ambulatory Visit (INDEPENDENT_AMBULATORY_CARE_PROVIDER_SITE_OTHER): Payer: No Typology Code available for payment source

## 2013-08-29 DIAGNOSIS — I4891 Unspecified atrial fibrillation: Secondary | ICD-10-CM

## 2013-08-29 LAB — POCT INR: INR: 2.1

## 2013-09-04 ENCOUNTER — Encounter: Payer: Self-pay | Admitting: Internal Medicine

## 2013-09-04 ENCOUNTER — Ambulatory Visit (INDEPENDENT_AMBULATORY_CARE_PROVIDER_SITE_OTHER): Payer: No Typology Code available for payment source | Admitting: *Deleted

## 2013-09-04 DIAGNOSIS — I4891 Unspecified atrial fibrillation: Secondary | ICD-10-CM

## 2013-09-04 LAB — MDC_IDC_ENUM_SESS_TYPE_REMOTE
Battery Impedance: 155 Ohm
Battery Remaining Longevity: 126 mo
Battery Voltage: 2.78 V
Brady Statistic AP VP Percent: 0 %
Brady Statistic AP VS Percent: 90 %
Brady Statistic AS VP Percent: 0 %
Brady Statistic AS VS Percent: 9 %
Date Time Interrogation Session: 20141222203113
Lead Channel Impedance Value: 396 Ohm
Lead Channel Impedance Value: 788 Ohm
Lead Channel Pacing Threshold Amplitude: 0.375 V
Lead Channel Pacing Threshold Amplitude: 0.5 V
Lead Channel Pacing Threshold Pulse Width: 0.4 ms
Lead Channel Pacing Threshold Pulse Width: 0.4 ms
Lead Channel Sensing Intrinsic Amplitude: 22.4 mV
Lead Channel Setting Pacing Amplitude: 2 V
Lead Channel Setting Pacing Amplitude: 2.5 V
Lead Channel Setting Pacing Pulse Width: 0.4 ms
Lead Channel Setting Sensing Sensitivity: 5.6 mV

## 2013-09-08 ENCOUNTER — Other Ambulatory Visit: Payer: Self-pay | Admitting: *Deleted

## 2013-09-08 ENCOUNTER — Telehealth: Payer: Self-pay | Admitting: Cardiology

## 2013-09-08 MED ORDER — WARFARIN SODIUM 2 MG PO TABS: ORAL_TABLET | ORAL | Status: DC

## 2013-09-08 MED ORDER — FUROSEMIDE 80 MG PO TABS: ORAL_TABLET | ORAL | Status: DC

## 2013-09-08 NOTE — Telephone Encounter (Signed)
New refills    Pt called needs med Warfarin, Amiodarone and Furosemide called in to [facility] in [place] please.

## 2013-09-26 ENCOUNTER — Ambulatory Visit (INDEPENDENT_AMBULATORY_CARE_PROVIDER_SITE_OTHER): Payer: Medicare Other | Admitting: *Deleted

## 2013-09-26 DIAGNOSIS — I4891 Unspecified atrial fibrillation: Secondary | ICD-10-CM

## 2013-09-26 LAB — POCT INR: INR: 2

## 2013-09-27 ENCOUNTER — Encounter: Payer: Self-pay | Admitting: *Deleted

## 2013-10-24 ENCOUNTER — Ambulatory Visit (INDEPENDENT_AMBULATORY_CARE_PROVIDER_SITE_OTHER): Payer: Medicare Other | Admitting: Pharmacist

## 2013-10-24 DIAGNOSIS — I4891 Unspecified atrial fibrillation: Secondary | ICD-10-CM

## 2013-10-24 LAB — POCT INR: INR: 1.8

## 2013-10-31 ENCOUNTER — Other Ambulatory Visit: Payer: Self-pay

## 2013-10-31 MED ORDER — LISINOPRIL 5 MG PO TABS: 5.0000 mg | ORAL_TABLET | Freq: Every day | ORAL | Status: DC

## 2013-11-14 ENCOUNTER — Ambulatory Visit (INDEPENDENT_AMBULATORY_CARE_PROVIDER_SITE_OTHER): Payer: Medicare Other | Admitting: Pharmacist

## 2013-11-14 DIAGNOSIS — I4891 Unspecified atrial fibrillation: Secondary | ICD-10-CM

## 2013-11-14 LAB — POCT INR: INR: 1.9

## 2013-11-28 ENCOUNTER — Ambulatory Visit (INDEPENDENT_AMBULATORY_CARE_PROVIDER_SITE_OTHER): Payer: Medicare Other | Admitting: Pharmacist Clinician (PhC)/ Clinical Pharmacy Specialist

## 2013-11-28 DIAGNOSIS — I4891 Unspecified atrial fibrillation: Secondary | ICD-10-CM

## 2013-11-28 LAB — POCT INR: INR: 2.3

## 2013-12-04 ENCOUNTER — Other Ambulatory Visit: Payer: Self-pay | Admitting: *Deleted

## 2013-12-04 LAB — MDC_IDC_ENUM_SESS_TYPE_REMOTE
Battery Impedance: 155 Ohm
Battery Remaining Longevity: 10.5
Battery Voltage: 2.78 V
Brady Statistic AP VP Percent: 0.5 %
Brady Statistic AP VS Percent: 89.1 %
Brady Statistic AS VP Percent: 0.1 % — CL
Brady Statistic AS VS Percent: 10.4 %
Lead Channel Impedance Value: 422 Ohm
Lead Channel Impedance Value: 809 Ohm
Lead Channel Pacing Threshold Amplitude: 0.5 V
Lead Channel Pacing Threshold Amplitude: 0.625 V
Lead Channel Pacing Threshold Pulse Width: 0.4 ms
Lead Channel Pacing Threshold Pulse Width: 0.4 ms
Lead Channel Sensing Intrinsic Amplitude: 11.2 mV
Lead Channel Setting Pacing Amplitude: 2 V
Lead Channel Setting Pacing Amplitude: 2.5 V
Lead Channel Setting Pacing Pulse Width: 0.4 ms
Lead Channel Setting Sensing Sensitivity: 5.6 mV

## 2013-12-04 MED ORDER — ATORVASTATIN CALCIUM 10 MG PO TABS
10.0000 mg | ORAL_TABLET | Freq: Every day | ORAL | Status: DC
Start: 2013-12-04 — End: 2014-01-03

## 2013-12-06 ENCOUNTER — Ambulatory Visit (INDEPENDENT_AMBULATORY_CARE_PROVIDER_SITE_OTHER): Payer: Medicare Other | Admitting: *Deleted

## 2013-12-06 DIAGNOSIS — I495 Sick sinus syndrome: Secondary | ICD-10-CM

## 2013-12-06 DIAGNOSIS — I4891 Unspecified atrial fibrillation: Secondary | ICD-10-CM

## 2013-12-06 DIAGNOSIS — I428 Other cardiomyopathies: Secondary | ICD-10-CM

## 2013-12-25 ENCOUNTER — Ambulatory Visit (INDEPENDENT_AMBULATORY_CARE_PROVIDER_SITE_OTHER): Payer: Medicare Other | Admitting: Cardiology

## 2013-12-25 ENCOUNTER — Ambulatory Visit (INDEPENDENT_AMBULATORY_CARE_PROVIDER_SITE_OTHER): Payer: Medicare Other | Admitting: *Deleted

## 2013-12-25 ENCOUNTER — Encounter: Payer: Self-pay | Admitting: Cardiology

## 2013-12-25 VITALS — BP 126/88 | HR 82 | Ht 62.0 in | Wt 155.4 lb

## 2013-12-25 DIAGNOSIS — I4891 Unspecified atrial fibrillation: Secondary | ICD-10-CM

## 2013-12-25 DIAGNOSIS — Z5181 Encounter for therapeutic drug level monitoring: Secondary | ICD-10-CM

## 2013-12-25 DIAGNOSIS — Z79899 Other long term (current) drug therapy: Secondary | ICD-10-CM

## 2013-12-25 DIAGNOSIS — I495 Sick sinus syndrome: Secondary | ICD-10-CM

## 2013-12-25 DIAGNOSIS — E785 Hyperlipidemia, unspecified: Secondary | ICD-10-CM

## 2013-12-25 DIAGNOSIS — I10 Essential (primary) hypertension: Secondary | ICD-10-CM

## 2013-12-25 HISTORY — DX: Encounter for therapeutic drug level monitoring: Z51.81

## 2013-12-25 LAB — POCT INR: INR: 2

## 2013-12-25 MED ORDER — POTASSIUM CHLORIDE CRYS ER 20 MEQ PO TBCR: EXTENDED_RELEASE_TABLET | ORAL | Status: DC

## 2013-12-25 NOTE — Progress Notes (Signed)
HPI The patient presents after followup of had tachybradycardia syndrome.  Since I last saw her she has done OK.  The patient denies any new symptoms such as chest discomfort, neck or arm discomfort. There has been no new shortness of breath, PND or orthopnea. There have been no reported palpitations, presyncope or syncope.   She walks outside when the weather allows and she has been doing this recently.     No Known Allergies  Current Outpatient Prescriptions  Medication Sig Dispense Refill  . amiodarone (PACERONE) 200 MG tablet Take 0.5 tablets (100 mg total) by mouth daily. Take Amiodarone 5 days per week.  30 tablet  11  . atorvastatin (LIPITOR) 10 MG tablet Take 1 tablet (10 mg total) by mouth daily.  30 tablet  0  . furosemide (LASIX) 80 MG tablet TAKE ONE TABLET BY MOUTH ONE TIME DAILY  30 tablet  4  . lisinopril (PRINIVIL,ZESTRIL) 5 MG tablet Take 1 tablet (5 mg total) by mouth daily.  30 tablet  4  . Multiple Vitamin (MULTIVITAMIN) tablet Take 1 tablet by mouth daily.        . Nutritional Supplements (MELATONIN PO) Take 1 capsule by mouth at bedtime as needed. For sleep.       Marland Kitchen Olopatadine HCl (PATADAY) 0.2 % SOLN Apply to eye as directed.      . potassium chloride SA (K-DUR,KLOR-CON) 20 MEQ tablet TAKE ONE TABLET BY MOUTH ONE TIME DAILY  30 tablet  11  . travoprost, benzalkonium, (TRAVATAN) 0.004 % ophthalmic solution Place 1 drop into both eyes at bedtime.  2.5 mL  2  . warfarin (COUMADIN) 2 MG tablet Take as directed by anticoagulation clinic  50 tablet  3   No current facility-administered medications for this visit.    Past Medical History  Diagnosis Date  . Malleolar fracture   . Hypertension   . Dementia     mild  . Dyslipidemia   . Glaucoma   . Anemia   . Cardiomyopathy     presumed ischemic in the past with an EF of 35%. The most recent EF in 03-2008, however, was 55 -60%  . Chronic renal insufficiency   . Bradycardia   . Paroxysmal atrial fibrillation   .  Arthritis     "knees especially"    Past Surgical History  Procedure Laterality Date  . Hemorrhoid surgery    . Insert / replace / remove pacemaker  09/17/11    initial placement  . Insert / replace / remove pacemaker  09/18/11  . Abdominal hysterectomy    . Tonsillectomy and adenoidectomy    . Fracture surgery  ~ 2007    RLE  . Cardioversion  07/2011  . Eye surgery  2002    "blood clot behind eye"    ROS: As stated in the HPI and negative for all other systems.  PHYSICAL EXAM BP 126/88  Pulse 82  Ht 5\' 2"  (1.575 m)  Wt 155 lb 6.4 oz (70.489 kg)  BMI 28.42 kg/m2 GEN:  No distress NECK:  No jugular venous distention at 45 degrees, waveform within normal limits, carotid upstroke brisk and symmetric, no bruits, no thyromegaly LUNGS:  Clear to auscultation bilaterally BACK:  No CVA tenderness CHEST:  Well healed pacemaker pocket. HEART:  S1 and S2 within normal limits, no S3, no clicks, no rubs, soft apical systolic murmur ABD:  Positive bowel sounds normal in frequency in pitch, no bruits, no rebound, no guarding, no midline pulsatile  mass or hepatomeally EXT:  2 plus pulses throughout, mild edema, no cyanosis no clubbing  EKG:  Atrial paced rhythm with IVCD rate 82.  12/25/2013  ASSESSMENT AND PLAN  CARDIOMYOPATHY -  She seems to be euvolemic. At this point, no change in therapy is indicated.   HYPERTENSION -  The blood pressure is at target. No change in medications is indicated. We will continue with therapeutic lifestyle changes (TLC).  Tachycardia-bradycardia syndrome -  She's had no symptomatic tachyarrhythmias. She remains on amiodarone and I will repeat labs at the next visit in June with Dr. Caryl Comes  Lab Results  Component Value Date   TSH 2.68 08/08/2013   ALT 21 08/08/2013   AST 20 08/08/2013   ALKPHOS 55 08/08/2013   BILITOT 1.4* 08/08/2013   PROT 7.0 08/08/2013   ALBUMIN 4.1 08/08/2013   Dyslipidemia - It has been quite awhile since she has had lipids  and these will be checked when she comes back for her appt in June.

## 2013-12-25 NOTE — Patient Instructions (Signed)
The current medical regimen is effective;  continue present plan and medications.  Please have fasting lab work at your appt in June.  Follow up with Dr Percival Spanish in September at the [facility] office.

## 2013-12-27 ENCOUNTER — Other Ambulatory Visit: Payer: Self-pay | Admitting: Pharmacist

## 2013-12-27 MED ORDER — WARFARIN SODIUM 2 MG PO TABS: ORAL_TABLET | ORAL | Status: DC

## 2014-01-03 ENCOUNTER — Other Ambulatory Visit: Payer: Self-pay

## 2014-01-03 MED ORDER — ATORVASTATIN CALCIUM 10 MG PO TABS: 10.0000 mg | ORAL_TABLET | Freq: Every day | ORAL | Status: DC

## 2014-01-23 ENCOUNTER — Ambulatory Visit (INDEPENDENT_AMBULATORY_CARE_PROVIDER_SITE_OTHER): Payer: Medicare Other | Admitting: Pharmacist Clinician (PhC)/ Clinical Pharmacy Specialist

## 2014-01-23 DIAGNOSIS — I4891 Unspecified atrial fibrillation: Secondary | ICD-10-CM

## 2014-01-23 DIAGNOSIS — Z5181 Encounter for therapeutic drug level monitoring: Secondary | ICD-10-CM

## 2014-01-23 LAB — POCT INR: INR: 2.5

## 2014-02-07 ENCOUNTER — Other Ambulatory Visit: Payer: Self-pay

## 2014-02-07 MED ORDER — FUROSEMIDE 80 MG PO TABS: ORAL_TABLET | ORAL | Status: DC

## 2014-02-13 ENCOUNTER — Encounter: Payer: Self-pay | Admitting: Internal Medicine

## 2014-02-13 ENCOUNTER — Ambulatory Visit (INDEPENDENT_AMBULATORY_CARE_PROVIDER_SITE_OTHER): Payer: Medicare Other | Admitting: Pharmacist

## 2014-02-13 ENCOUNTER — Ambulatory Visit (INDEPENDENT_AMBULATORY_CARE_PROVIDER_SITE_OTHER): Payer: Medicare Other | Admitting: Internal Medicine

## 2014-02-13 VITALS — BP 157/87 | HR 88 | Ht 62.0 in | Wt 151.0 lb

## 2014-02-13 DIAGNOSIS — E785 Hyperlipidemia, unspecified: Secondary | ICD-10-CM

## 2014-02-13 DIAGNOSIS — Z79899 Other long term (current) drug therapy: Secondary | ICD-10-CM

## 2014-02-13 DIAGNOSIS — I4891 Unspecified atrial fibrillation: Secondary | ICD-10-CM

## 2014-02-13 DIAGNOSIS — Z5181 Encounter for therapeutic drug level monitoring: Secondary | ICD-10-CM

## 2014-02-13 DIAGNOSIS — Z950 Presence of cardiac pacemaker: Secondary | ICD-10-CM

## 2014-02-13 DIAGNOSIS — I495 Sick sinus syndrome: Secondary | ICD-10-CM

## 2014-02-13 LAB — MDC_IDC_ENUM_SESS_TYPE_INCLINIC
Battery Impedance: 178 Ohm
Battery Remaining Longevity: 124 mo
Battery Voltage: 2.78 V
Brady Statistic AP VP Percent: 1 %
Brady Statistic AP VS Percent: 88 %
Brady Statistic AS VP Percent: 0 %
Brady Statistic AS VS Percent: 11 %
Date Time Interrogation Session: 20150602104802
Lead Channel Impedance Value: 446 Ohm
Lead Channel Impedance Value: 803 Ohm
Lead Channel Pacing Threshold Amplitude: 0.5 V
Lead Channel Pacing Threshold Amplitude: 0.5 V
Lead Channel Pacing Threshold Pulse Width: 0.4 ms
Lead Channel Pacing Threshold Pulse Width: 0.4 ms
Lead Channel Sensing Intrinsic Amplitude: 15.67 mV
Lead Channel Sensing Intrinsic Amplitude: 2.8 mV
Lead Channel Setting Pacing Amplitude: 2 V
Lead Channel Setting Pacing Amplitude: 2.5 V
Lead Channel Setting Pacing Pulse Width: 0.4 ms
Lead Channel Setting Sensing Sensitivity: 5.6 mV

## 2014-02-13 LAB — HEPATIC FUNCTION PANEL
ALT: 18 U/L (ref 0–35)
AST: 20 U/L (ref 0–37)
Albumin: 4.3 g/dL (ref 3.5–5.2)
Alkaline Phosphatase: 51 U/L (ref 39–117)
Bilirubin, Direct: 0.2 mg/dL (ref 0.0–0.3)
Total Bilirubin: 1.3 mg/dL — ABNORMAL HIGH (ref 0.2–1.2)
Total Protein: 7 g/dL (ref 6.0–8.3)

## 2014-02-13 LAB — LIPID PANEL
Cholesterol: 135 mg/dL (ref 0–200)
HDL: 96.3 mg/dL (ref >39.00)
LDL Cholesterol: 32 mg/dL (ref 0–99)
Total CHOL/HDL Ratio: 1
Triglycerides: 32 mg/dL (ref 0.0–149.0)
VLDL: 6.4 mg/dL (ref 0.0–40.0)

## 2014-02-13 LAB — POCT INR: INR: 1.8

## 2014-02-13 LAB — TSH: TSH: 2.68 u[IU]/mL (ref 0.35–4.50)

## 2014-02-13 NOTE — Patient Instructions (Addendum)
Labs today: Hepatic function, Lipid profile, TSH  Remote monitoring is used to monitor your pacemaker from home. This monitoring reduces the number of office visits required to check your device to one time per year. It allows [place] to keep an eye on the functioning of your device to ensure it is working properly. You are scheduled for a device check from home on 05-17-2014. You may send your transmission at any time that day. If you have a wireless device, the transmission will be sent automatically. After your physician reviews your transmission, you will receive a postcard with your next transmission date.  Your physician recommends that you schedule a follow-up appointment in: 12 months with Dr.Klein

## 2014-02-13 NOTE — Progress Notes (Signed)
Patient Care Team: Chriss Czar, MD as PCP - General (Family Medicine)   HPI  [name] is a 80 y.o. female  Seen In followup for pacemaker implanted for tachybradycardia syndrome January 0000000 complicated by atrial lead dislodgment. She had a new lead placed thereafter. She also has atrial fibrillation and is on amiodarone.   No significant atrial fibrillation.    The patient denies chest pain, shortness of breath, nocturnal dyspnea, orthopnea or peripheral edema.  There have been no palpitations, lightheadedness or syncope.   No cough or nausea  Past Medical History  Diagnosis Date  . Malleolar fracture   . Hypertension   . Dementia     mild  . Dyslipidemia   . Glaucoma   . Anemia   . Cardiomyopathy     presumed ischemic in the past with an EF of 35%. The most recent EF in 03-2008, however, was 55 -60%  . Chronic renal insufficiency   . Bradycardia   . Paroxysmal atrial fibrillation   . Arthritis     "knees especially"    Past Surgical History  Procedure Laterality Date  . Hemorrhoid surgery    . Insert / replace / remove pacemaker  09/17/11    initial placement  . Insert / replace / remove pacemaker  09/18/11  . Abdominal hysterectomy    . Tonsillectomy and adenoidectomy    . Fracture surgery  ~ 2007    RLE  . Cardioversion  07/2011  . Eye surgery  2002    "blood clot behind eye"    Current Outpatient Prescriptions  Medication Sig Dispense Refill  . amiodarone (PACERONE) 200 MG tablet Take 0.5 tablets (100 mg total) by mouth daily. Take Amiodarone 5 days per week.  30 tablet  11  . atorvastatin (LIPITOR) 10 MG tablet Take 1 tablet (10 mg total) by mouth daily.  30 tablet  6  . furosemide (LASIX) 80 MG tablet TAKE ONE TABLET BY MOUTH ONE TIME DAILY  30 tablet  4  . lisinopril (PRINIVIL,ZESTRIL) 5 MG tablet Take 1 tablet (5 mg total) by mouth daily.  30 tablet  4  . Multiple Vitamin (MULTIVITAMIN) tablet Take 1 tablet by mouth daily.        .  Olopatadine HCl (PATADAY) 0.2 % SOLN Apply to eye as directed.      . potassium chloride SA (K-DUR,KLOR-CON) 20 MEQ tablet TAKE ONE TABLET BY MOUTH ONE TIME DAILY  30 tablet  11  . travoprost, benzalkonium, (TRAVATAN) 0.004 % ophthalmic solution Place 1 drop into both eyes at bedtime.  2.5 mL  2  . warfarin (COUMADIN) 2 MG tablet Take as directed by anticoagulation clinic  50 tablet  3   No current facility-administered medications for this visit.    No Known Allergies  Review of Systems negative except from HPI and PMH  Physical Exam BP 157/87  Pulse 88  Ht 5\' 2"  (1.575 m)  Wt 151 lb (68.493 kg)  BMI 27.61 kg/m2 Well developed and well nourished in no acute distress HENT normal E scleral and icterus clear Neck Supple JVP flat; carotids brisk and full Clear to ausculation  Regular rate and rhythm, 2/6 systolic murmur Soft with active bowel sounds No clubbing cyanosis  Edema Alert and oriented, grossly normal motor and sensory function Skin Warm and Dry    Assessment and  Plan   Atrial fibrillation   sinus node dysfunction  Pacemaker- Medtronic The patient's device was interrogated.  The  information was reviewed. No changes were made in the programming.     Hypertension   blood pressure is elevated today but was normal at home. We will not make any adjustments.  We will continue her on low-dose amiodarone and check her surveillance laboratories today.  No significant intercurrent atrial fibrillation  She is 100% atrially paced

## 2014-03-13 ENCOUNTER — Ambulatory Visit (INDEPENDENT_AMBULATORY_CARE_PROVIDER_SITE_OTHER): Payer: Medicare Other | Admitting: *Deleted

## 2014-03-13 DIAGNOSIS — I4891 Unspecified atrial fibrillation: Secondary | ICD-10-CM

## 2014-03-13 DIAGNOSIS — Z5181 Encounter for therapeutic drug level monitoring: Secondary | ICD-10-CM

## 2014-03-13 LAB — POCT INR: INR: 2

## 2014-03-29 ENCOUNTER — Other Ambulatory Visit: Payer: Self-pay

## 2014-03-29 MED ORDER — LISINOPRIL 5 MG PO TABS: 5.0000 mg | ORAL_TABLET | Freq: Every day | ORAL | Status: DC

## 2014-04-10 ENCOUNTER — Ambulatory Visit (INDEPENDENT_AMBULATORY_CARE_PROVIDER_SITE_OTHER): Payer: Medicare Other

## 2014-04-10 DIAGNOSIS — I4891 Unspecified atrial fibrillation: Secondary | ICD-10-CM

## 2014-04-10 DIAGNOSIS — Z5181 Encounter for therapeutic drug level monitoring: Secondary | ICD-10-CM

## 2014-04-10 LAB — POCT INR: INR: 2.4

## 2014-05-01 ENCOUNTER — Telehealth: Payer: Self-pay | Admitting: *Deleted

## 2014-05-01 MED ORDER — WARFARIN SODIUM 2 MG PO TABS: ORAL_TABLET | ORAL | Status: DC

## 2014-05-02 ENCOUNTER — Other Ambulatory Visit: Payer: Self-pay | Admitting: *Deleted

## 2014-05-02 MED ORDER — WARFARIN SODIUM 2 MG PO TABS
ORAL_TABLET | ORAL | Status: DC
Start: 2014-05-02 — End: 2014-07-24

## 2014-05-02 NOTE — Telephone Encounter (Signed)
Refill done as requested 

## 2014-05-08 ENCOUNTER — Ambulatory Visit (INDEPENDENT_AMBULATORY_CARE_PROVIDER_SITE_OTHER): Payer: Medicare Other

## 2014-05-08 DIAGNOSIS — I4891 Unspecified atrial fibrillation: Secondary | ICD-10-CM

## 2014-05-08 DIAGNOSIS — Z5181 Encounter for therapeutic drug level monitoring: Secondary | ICD-10-CM

## 2014-05-08 LAB — POCT INR: INR: 1.9

## 2014-05-17 ENCOUNTER — Telehealth: Payer: Self-pay | Admitting: Cardiology

## 2014-05-17 ENCOUNTER — Ambulatory Visit: Payer: Medicare Other | Admitting: *Deleted

## 2014-05-17 NOTE — Telephone Encounter (Signed)
LMOVM reminding pt to send remote transmission.   

## 2014-05-18 ENCOUNTER — Encounter: Payer: Self-pay | Admitting: Cardiology

## 2014-05-20 ENCOUNTER — Encounter: Payer: Self-pay | Admitting: Internal Medicine

## 2014-05-20 LAB — MDC_IDC_ENUM_SESS_TYPE_REMOTE
Battery Impedance: 178 Ohm
Battery Remaining Longevity: 122 mo
Battery Voltage: 2.78 V
Brady Statistic AP VP Percent: 1 %
Brady Statistic AP VS Percent: 88 %
Brady Statistic AS VP Percent: 0 %
Brady Statistic AS VS Percent: 11 %
Date Time Interrogation Session: 20150906152546
Lead Channel Impedance Value: 401 Ohm
Lead Channel Impedance Value: 765 Ohm
Lead Channel Pacing Threshold Amplitude: 0.375 V
Lead Channel Pacing Threshold Amplitude: 0.625 V
Lead Channel Pacing Threshold Pulse Width: 0.4 ms
Lead Channel Pacing Threshold Pulse Width: 0.4 ms
Lead Channel Sensing Intrinsic Amplitude: 11.2 mV
Lead Channel Setting Pacing Amplitude: 2 V
Lead Channel Setting Pacing Amplitude: 2.5 V
Lead Channel Setting Pacing Pulse Width: 0.4 ms
Lead Channel Setting Sensing Sensitivity: 5.6 mV

## 2014-05-22 ENCOUNTER — Ambulatory Visit (INDEPENDENT_AMBULATORY_CARE_PROVIDER_SITE_OTHER): Payer: Medicare Other | Admitting: *Deleted

## 2014-05-22 DIAGNOSIS — I495 Sick sinus syndrome: Secondary | ICD-10-CM

## 2014-05-25 NOTE — Progress Notes (Signed)
Remote pacemaker transmission.   

## 2014-06-05 ENCOUNTER — Ambulatory Visit (INDEPENDENT_AMBULATORY_CARE_PROVIDER_SITE_OTHER): Payer: Medicare Other | Admitting: Cardiology

## 2014-06-05 ENCOUNTER — Encounter: Payer: Self-pay | Admitting: Cardiology

## 2014-06-05 ENCOUNTER — Ambulatory Visit (INDEPENDENT_AMBULATORY_CARE_PROVIDER_SITE_OTHER): Payer: Medicare Other

## 2014-06-05 VITALS — BP 140/92 | HR 84 | Ht 60.5 in | Wt 150.2 lb

## 2014-06-05 DIAGNOSIS — I4891 Unspecified atrial fibrillation: Secondary | ICD-10-CM

## 2014-06-05 DIAGNOSIS — I509 Heart failure, unspecified: Secondary | ICD-10-CM

## 2014-06-05 DIAGNOSIS — Z5181 Encounter for therapeutic drug level monitoring: Secondary | ICD-10-CM

## 2014-06-05 DIAGNOSIS — I495 Sick sinus syndrome: Secondary | ICD-10-CM

## 2014-06-05 LAB — POCT INR: INR: 2.9

## 2014-06-05 NOTE — Progress Notes (Signed)
HPI The patient presents after followup of had tachybradycardia syndrome.  Since I last saw her she has done OK.  She saw Dr. Caryl Comes and had normal device function.  The patient denies any new symptoms such as chest discomfort, neck or arm discomfort. There has been no new shortness of breath, PND or orthopnea. There have been no reported palpitations, presyncope or syncope.     No Known Allergies  Current Outpatient Prescriptions  Medication Sig Dispense Refill  . amiodarone (PACERONE) 200 MG tablet Take 0.5 tablets (100 mg total) by mouth daily. Take Amiodarone 5 days per week.  30 tablet  11  . atorvastatin (LIPITOR) 10 MG tablet Take 1 tablet (10 mg total) by mouth daily.  30 tablet  6  . furosemide (LASIX) 80 MG tablet TAKE ONE TABLET BY MOUTH ONE TIME DAILY  30 tablet  4  . lisinopril (PRINIVIL,ZESTRIL) 5 MG tablet Take 1 tablet (5 mg total) by mouth daily.  30 tablet  4  . Multiple Vitamin (MULTIVITAMIN) tablet Take 1 tablet by mouth daily.        . Olopatadine HCl (PATADAY) 0.2 % SOLN Apply to eye as directed.      . potassium chloride SA (K-DUR,KLOR-CON) 20 MEQ tablet TAKE ONE TABLET BY MOUTH ONE TIME DAILY  30 tablet  11  . travoprost, benzalkonium, (TRAVATAN) 0.004 % ophthalmic solution Place 1 drop into both eyes at bedtime.  2.5 mL  2  . warfarin (COUMADIN) 2 MG tablet Take as directed by anticoagulation clinic  50 tablet  3   No current facility-administered medications for this visit.    Past Medical History  Diagnosis Date  . Malleolar fracture   . Hypertension   . Dementia     mild  . Dyslipidemia   . Glaucoma   . Anemia   . Cardiomyopathy     presumed ischemic in the past with an EF of 35%. The most recent EF in 03-2008, however, was 55 -60%  . Chronic renal insufficiency   . Bradycardia   . Paroxysmal atrial fibrillation   . Arthritis     "knees especially"    Past Surgical History  Procedure Laterality Date  . Hemorrhoid surgery    . Insert / replace /  remove pacemaker  09/17/11    initial placement  . Insert / replace / remove pacemaker  09/18/11  . Abdominal hysterectomy    . Tonsillectomy and adenoidectomy    . Fracture surgery  ~ 2007    RLE  . Cardioversion  07/2011  . Eye surgery  2002    "blood clot behind eye"    ROS: As stated in the HPI and negative for all other systems.  PHYSICAL EXAM BP 140/92  Pulse 84  Ht 5' 0.5" (1.537 m)  Wt 150 lb 3.2 oz (68.13 kg)  BMI 28.84 kg/m2 GEN:  No distress NECK:  No jugular venous distention at 45 degrees, waveform within normal limits, carotid upstroke brisk and symmetric, no bruits, no thyromegaly LUNGS:  Clear to auscultation bilaterally BACK:  No CVA tenderness CHEST:  Well healed pacemaker pocket. HEART:  S1 and S2 within normal limits, no S3, no clicks, no rubs, soft apical systolic murmur ABD:  Positive bowel sounds normal in frequency in pitch, no bruits, no rebound, no guarding, no midline pulsatile mass or hepatomeally EXT:  2 plus pulses throughout, mild edema, no cyanosis no clubbing  EKG:  Atrial paced rhythm with IVCD rate 84.  06/05/2014  ASSESSMENT AND PLAN  CARDIOMYOPATHY -  She seems to be euvolemic. At this point, no change in therapy is indicated.   HYPERTENSION -  The blood pressure is at target. No change in medications is indicated. We will continue with therapeutic lifestyle changes (TLC).  Tachycardia-bradycardia syndrome -  She's had no symptomatic tachyarrhythmias. She remains on amiodarone and she is up to date with follow up.   Lab Results  Component Value Date   TSH 2.68 02/13/2014   ALT 18 02/13/2014   AST 20 02/13/2014   ALKPHOS 51 02/13/2014   BILITOT 1.3* 02/13/2014   PROT 7.0 02/13/2014   ALBUMIN 4.3 02/13/2014   Dyslipidemia - Her lipids are excellent and she should continue the current meds.   Lab Results  Component Value Date   CHOL 135 02/13/2014   TRIG 32.0 02/13/2014   HDL 96.30 02/13/2014   LDLCALC 32 02/13/2014

## 2014-06-05 NOTE — Patient Instructions (Signed)
Your physician recommends that you schedule a follow-up appointment in: 3 months with Dr. Hochrein  

## 2014-06-18 ENCOUNTER — Encounter: Payer: Self-pay | Admitting: Cardiology

## 2014-07-03 ENCOUNTER — Encounter: Payer: Self-pay | Admitting: Cardiology

## 2014-07-03 ENCOUNTER — Ambulatory Visit (INDEPENDENT_AMBULATORY_CARE_PROVIDER_SITE_OTHER): Payer: Medicare Other | Admitting: *Deleted

## 2014-07-03 DIAGNOSIS — Z5181 Encounter for therapeutic drug level monitoring: Secondary | ICD-10-CM

## 2014-07-03 DIAGNOSIS — I4891 Unspecified atrial fibrillation: Secondary | ICD-10-CM

## 2014-07-03 LAB — POCT INR: INR: 2.4

## 2014-07-09 ENCOUNTER — Telehealth: Payer: Self-pay | Admitting: Cardiology

## 2014-07-09 MED ORDER — FUROSEMIDE 80 MG PO TABS: ORAL_TABLET | ORAL | Status: DC

## 2014-07-09 MED ORDER — ATORVASTATIN CALCIUM 10 MG PO TABS: 10.0000 mg | ORAL_TABLET | Freq: Every day | ORAL | Status: DC

## 2014-07-09 NOTE — Telephone Encounter (Signed)
E-sent prescription to pharmacy- atorvastatin and furosemide x6 refill

## 2014-07-09 NOTE — Telephone Encounter (Addendum)
Pt called in needing a refill on her atorvastatin and her furosemide called in to the [facility] in [place]

## 2014-07-24 ENCOUNTER — Other Ambulatory Visit: Payer: Self-pay | Admitting: *Deleted

## 2014-07-24 MED ORDER — WARFARIN SODIUM 2 MG PO TABS: ORAL_TABLET | ORAL | Status: DC

## 2014-07-24 NOTE — Telephone Encounter (Signed)
Refill done as requested 

## 2014-07-31 ENCOUNTER — Ambulatory Visit (INDEPENDENT_AMBULATORY_CARE_PROVIDER_SITE_OTHER): Payer: Medicare Other | Admitting: Pharmacist Clinician (PhC)/ Clinical Pharmacy Specialist

## 2014-07-31 DIAGNOSIS — Z5181 Encounter for therapeutic drug level monitoring: Secondary | ICD-10-CM

## 2014-07-31 DIAGNOSIS — I4891 Unspecified atrial fibrillation: Secondary | ICD-10-CM

## 2014-07-31 LAB — POCT INR: INR: 2.4

## 2014-08-22 ENCOUNTER — Other Ambulatory Visit: Payer: Self-pay | Admitting: Physician Assistant

## 2014-08-23 ENCOUNTER — Encounter (HOSPITAL_COMMUNITY): Payer: Self-pay | Admitting: Internal Medicine

## 2014-08-26 ENCOUNTER — Encounter: Payer: Self-pay | Admitting: Internal Medicine

## 2014-08-27 ENCOUNTER — Ambulatory Visit (INDEPENDENT_AMBULATORY_CARE_PROVIDER_SITE_OTHER): Payer: Medicare Other | Admitting: *Deleted

## 2014-08-27 ENCOUNTER — Encounter: Payer: Self-pay | Admitting: Cardiology

## 2014-08-27 ENCOUNTER — Ambulatory Visit (INDEPENDENT_AMBULATORY_CARE_PROVIDER_SITE_OTHER): Payer: Medicare Other | Admitting: Pharmacist Clinician (PhC)/ Clinical Pharmacy Specialist

## 2014-08-27 ENCOUNTER — Ambulatory Visit (INDEPENDENT_AMBULATORY_CARE_PROVIDER_SITE_OTHER): Payer: Medicare Other | Admitting: Cardiology

## 2014-08-27 VITALS — BP 154/70 | HR 85 | Ht 60.0 in | Wt 145.0 lb

## 2014-08-27 DIAGNOSIS — Z5181 Encounter for therapeutic drug level monitoring: Secondary | ICD-10-CM

## 2014-08-27 DIAGNOSIS — I495 Sick sinus syndrome: Secondary | ICD-10-CM

## 2014-08-27 DIAGNOSIS — I4891 Unspecified atrial fibrillation: Secondary | ICD-10-CM

## 2014-08-27 DIAGNOSIS — I429 Cardiomyopathy, unspecified: Secondary | ICD-10-CM

## 2014-08-27 LAB — MDC_IDC_ENUM_SESS_TYPE_REMOTE
Battery Impedance: 178 Ohm
Battery Remaining Longevity: 123 mo
Battery Voltage: 2.78 V
Brady Statistic AP VP Percent: 1 %
Brady Statistic AP VS Percent: 84 %
Brady Statistic AS VP Percent: 0 %
Brady Statistic AS VS Percent: 15 %
Date Time Interrogation Session: 20151213195705
Lead Channel Impedance Value: 406 Ohm
Lead Channel Impedance Value: 786 Ohm
Lead Channel Pacing Threshold Amplitude: 0.375 V
Lead Channel Pacing Threshold Amplitude: 0.5 V
Lead Channel Pacing Threshold Pulse Width: 0.4 ms
Lead Channel Pacing Threshold Pulse Width: 0.4 ms
Lead Channel Sensing Intrinsic Amplitude: 11.2 mV
Lead Channel Setting Pacing Amplitude: 2 V
Lead Channel Setting Pacing Amplitude: 2.5 V
Lead Channel Setting Pacing Pulse Width: 0.4 ms
Lead Channel Setting Sensing Sensitivity: 5.6 mV

## 2014-08-27 LAB — POCT INR: INR: 2.7

## 2014-08-27 NOTE — Progress Notes (Signed)
HPI The patient presents after followup of had tachybradycardia syndrome.  Since I last saw her she has done OK.  She does her activities of daily living and has no complaints.  The patient denies any new symptoms such as chest discomfort, neck or arm discomfort. There has been no new shortness of breath, PND or orthopnea. There have been no reported palpitations, presyncope or syncope.   No Known Allergies  Current Outpatient Prescriptions  Medication Sig Dispense Refill  . amiodarone (PACERONE) 200 MG tablet TAKE 1/2 TABLET BY MOUTH DAILY FOR 5 DAYS A WEEK AS DIRECTED 30 tablet 0  . atorvastatin (LIPITOR) 10 MG tablet Take 1 tablet (10 mg total) by mouth daily. 30 tablet 6  . furosemide (LASIX) 80 MG tablet TAKE ONE TABLET BY MOUTH ONE TIME DAILY 30 tablet 6  . lisinopril (PRINIVIL,ZESTRIL) 5 MG tablet Take 1 tablet (5 mg total) by mouth daily. 30 tablet 4  . Multiple Vitamin (MULTIVITAMIN) tablet Take 1 tablet by mouth daily.      . Olopatadine HCl (PATADAY) 0.2 % SOLN Apply to eye as directed.    . potassium chloride SA (K-DUR,KLOR-CON) 20 MEQ tablet TAKE ONE TABLET BY MOUTH ONE TIME DAILY 30 tablet 11  . travoprost, benzalkonium, (TRAVATAN) 0.004 % ophthalmic solution Place 1 drop into both eyes at bedtime. 2.5 mL 2  . warfarin (COUMADIN) 2 MG tablet Take as directed by anticoagulation clinic 50 tablet 3   No current facility-administered medications for this visit.    Past Medical History  Diagnosis Date  . Malleolar fracture   . Hypertension   . Dementia     mild  . Dyslipidemia   . Glaucoma   . Anemia   . Cardiomyopathy     presumed ischemic in the past with an EF of 35%. The most recent EF in 03-2008, however, was 55 -60%  . Chronic renal insufficiency   . Bradycardia   . Paroxysmal atrial fibrillation   . Arthritis     "knees especially"    Past Surgical History  Procedure Laterality Date  . Hemorrhoid surgery    . Insert / replace / remove pacemaker  09/17/11   initial placement  . Insert / replace / remove pacemaker  09/18/11  . Abdominal hysterectomy    . Tonsillectomy and adenoidectomy    . Fracture surgery  ~ 2007    RLE  . Cardioversion  07/2011  . Eye surgery  2002    "blood clot behind eye"  . Permanent pacemaker insertion N/A 09/17/2011    Procedure: PERMANENT PACEMAKER INSERTION;  Surgeon: Deboraha Sprang, MD;  Location: [facility] CATH LAB;  Service: Cardiovascular;  Laterality: N/A;  . Pacemaker revision N/A 09/18/2011    Procedure: PACEMAKER REVISION;  Surgeon: Thompson Grayer, MD;  Location: [facility] CATH LAB;  Service: Cardiovascular;  Laterality: N/A;    ROS: URI.  Otherwise as stated in the HPI and negative for all other systems.  PHYSICAL EXAM BP 154/70 mmHg  Pulse 85  Ht 5' (1.524 m)  Wt 145 lb (65.772 kg)  BMI 28.32 kg/m2 GEN:  No distress NECK:  No jugular venous distention at 45 degrees, waveform within normal limits, carotid upstroke brisk and symmetric, no bruits, no thyromegaly LUNGS:  Clear to auscultation bilaterally BACK:  No CVA tenderness CHEST:  Well healed pacemaker pocket. HEART:  S1 and S2 within normal limits, no S3, no clicks, no rubs, soft apical systolic murmur ABD:  Positive bowel sounds normal in frequency in  pitch, no bruits, no rebound, no guarding, no midline pulsatile mass or hepatomeally EXT:  2 plus pulses throughout, mild edema, no cyanosis no clubbing  EKG:  Atrial paced rhythm with IVCD rate 85.  08/27/2014  ASSESSMENT AND PLAN  CARDIOMYOPATHY -  She seems to be euvolemic. At this point, no change in therapy is indicated.   HYPERTENSION -  The blood pressure is slightly elevated today. However, this is unusual.  No change in medications is indicated. We will continue with therapeutic lifestyle changes (TLC).  Tachycardia-bradycardia syndrome -  She's had no symptomatic tachyarrhythmias. She remains on amiodarone and she is up to date with follow up. She tolerates anticoagulation.  Lab Results  Component  Value Date   TSH 2.68 02/13/2014   ALT 18 02/13/2014   AST 20 02/13/2014   ALKPHOS 51 02/13/2014   BILITOT 1.3* 02/13/2014   PROT 7.0 02/13/2014   ALBUMIN 4.3 02/13/2014    Dyslipidemia - Her lipids are excellent and she should continue the current meds.

## 2014-08-27 NOTE — Progress Notes (Signed)
Remote pacemaker transmission.   

## 2014-08-27 NOTE — Patient Instructions (Signed)
Your physician recommends that you schedule a follow-up appointment in: 3 months with Dr. Hochrein  

## 2014-09-03 ENCOUNTER — Telehealth: Payer: Self-pay | Admitting: Cardiology

## 2014-09-03 NOTE — Telephone Encounter (Signed)
Pt need a refill on her Lisinopril. Please call to [phone].

## 2014-09-05 ENCOUNTER — Other Ambulatory Visit: Payer: Self-pay | Admitting: *Deleted

## 2014-09-05 MED ORDER — LISINOPRIL 5 MG PO TABS: 5.0000 mg | ORAL_TABLET | Freq: Every day | ORAL | Status: DC

## 2014-09-20 ENCOUNTER — Encounter: Payer: Self-pay | Admitting: Cardiology

## 2014-09-25 ENCOUNTER — Ambulatory Visit (INDEPENDENT_AMBULATORY_CARE_PROVIDER_SITE_OTHER): Payer: Medicare Other | Admitting: *Deleted

## 2014-09-25 DIAGNOSIS — I4891 Unspecified atrial fibrillation: Secondary | ICD-10-CM

## 2014-09-25 DIAGNOSIS — Z5181 Encounter for therapeutic drug level monitoring: Secondary | ICD-10-CM

## 2014-09-25 LAB — POCT INR: INR: 2.7

## 2014-10-03 ENCOUNTER — Telehealth: Payer: Self-pay | Admitting: Cardiology

## 2014-10-03 NOTE — Telephone Encounter (Signed)
Has been several years since last Pna vaccine. Pt offered the shot at PCP and wanted to know if it was ok to take. Told her this was fine. Pt voiced understanding.

## 2014-10-03 NOTE — Telephone Encounter (Signed)
Pt called in wanting to know if it was ok for her to take a Pneumonia vaccine. Please call  Thanks

## 2014-10-04 ENCOUNTER — Encounter: Payer: Self-pay | Admitting: Cardiology

## 2014-10-04 DIAGNOSIS — J310 Chronic rhinitis: Secondary | ICD-10-CM | POA: Diagnosis not present

## 2014-10-23 ENCOUNTER — Ambulatory Visit (INDEPENDENT_AMBULATORY_CARE_PROVIDER_SITE_OTHER): Payer: Medicare Other | Admitting: *Deleted

## 2014-10-23 DIAGNOSIS — Z5181 Encounter for therapeutic drug level monitoring: Secondary | ICD-10-CM | POA: Diagnosis not present

## 2014-10-23 DIAGNOSIS — I4891 Unspecified atrial fibrillation: Secondary | ICD-10-CM

## 2014-10-23 LAB — POCT INR: INR: 2.7

## 2014-10-31 ENCOUNTER — Other Ambulatory Visit: Payer: Self-pay | Admitting: Cardiology

## 2014-11-23 ENCOUNTER — Other Ambulatory Visit: Payer: Self-pay

## 2014-11-23 MED ORDER — LISINOPRIL 5 MG PO TABS: 5.0000 mg | ORAL_TABLET | Freq: Every day | ORAL | Status: DC

## 2014-11-26 ENCOUNTER — Ambulatory Visit (INDEPENDENT_AMBULATORY_CARE_PROVIDER_SITE_OTHER): Payer: Medicare Other | Admitting: Cardiology

## 2014-11-26 ENCOUNTER — Encounter: Payer: Self-pay | Admitting: Cardiology

## 2014-11-26 ENCOUNTER — Ambulatory Visit (INDEPENDENT_AMBULATORY_CARE_PROVIDER_SITE_OTHER): Payer: Medicare Other | Admitting: Pharmacist Clinician (PhC)/ Clinical Pharmacy Specialist

## 2014-11-26 VITALS — BP 130/70 | Ht 60.0 in | Wt 142.0 lb

## 2014-11-26 DIAGNOSIS — R531 Weakness: Secondary | ICD-10-CM | POA: Diagnosis not present

## 2014-11-26 DIAGNOSIS — I4891 Unspecified atrial fibrillation: Secondary | ICD-10-CM

## 2014-11-26 DIAGNOSIS — Z5181 Encounter for therapeutic drug level monitoring: Secondary | ICD-10-CM

## 2014-11-26 DIAGNOSIS — Z79899 Other long term (current) drug therapy: Secondary | ICD-10-CM

## 2014-11-26 LAB — TSH: TSH: 3.399 u[IU]/mL (ref 0.350–4.500)

## 2014-11-26 LAB — HEPATIC FUNCTION PANEL
ALT: 12 U/L (ref 0–35)
AST: 15 U/L (ref 0–37)
Albumin: 4 g/dL (ref 3.5–5.2)
Alkaline Phosphatase: 51 U/L (ref 39–117)
Bilirubin, Direct: 0.3 mg/dL (ref 0.0–0.3)
Indirect Bilirubin: 0.9 mg/dL (ref 0.2–1.2)
Total Bilirubin: 1.2 mg/dL (ref 0.2–1.2)
Total Protein: 6.4 g/dL (ref 6.0–8.3)

## 2014-11-26 LAB — T3, FREE: T3, Free: 2.1 pg/mL — ABNORMAL LOW (ref 2.3–4.2)

## 2014-11-26 LAB — POCT INR: INR: 2.8

## 2014-11-26 LAB — T4, FREE: Free T4: 1.21 ng/dL (ref 0.80–1.80)

## 2014-11-26 NOTE — Progress Notes (Signed)
HPI The patient presents after followup of had tachybradycardia syndrome.  Since I last saw her she has done OK.  She does her activities of daily living and has no complaints.  The patient denies any new symptoms such as chest discomfort, neck or arm discomfort. There has been no new shortness of breath, PND or orthopnea. There have been no reported palpitations, presyncope or syncope.  She is going to have a biopsy of an oral lesion in the next few days.     No Known Allergies  Current Outpatient Prescriptions  Medication Sig Dispense Refill  . amiodarone (PACERONE) 200 MG tablet TAKE 1/2 TABLET BY MOUTH EVERY DAY FOR 5 DAYS A WEEK AS DIRECTED 30 tablet 3  . atorvastatin (LIPITOR) 10 MG tablet Take 1 tablet (10 mg total) by mouth daily. 30 tablet 6  . furosemide (LASIX) 80 MG tablet TAKE ONE TABLET BY MOUTH ONE TIME DAILY 30 tablet 6  . lisinopril (PRINIVIL,ZESTRIL) 5 MG tablet Take 1 tablet (5 mg total) by mouth daily. 90 tablet 1  . Multiple Vitamin (MULTIVITAMIN) tablet Take 1 tablet by mouth daily.      . Olopatadine HCl (PATADAY) 0.2 % SOLN Apply to eye as directed.    . potassium chloride SA (K-DUR,KLOR-CON) 20 MEQ tablet TAKE ONE TABLET BY MOUTH ONE TIME DAILY 30 tablet 11  . travoprost, benzalkonium, (TRAVATAN) 0.004 % ophthalmic solution Place 1 drop into both eyes at bedtime. 2.5 mL 2  . warfarin (COUMADIN) 2 MG tablet Take as directed by anticoagulation clinic 50 tablet 3   No current facility-administered medications for this visit.    Past Medical History  Diagnosis Date  . Malleolar fracture   . Hypertension   . Dementia     mild  . Dyslipidemia   . Glaucoma   . Anemia   . Cardiomyopathy     presumed ischemic in the past with an EF of 35%. The most recent EF in 03-2008, however, was 55 -60%  . Chronic renal insufficiency   . Bradycardia   . Paroxysmal atrial fibrillation   . Arthritis     "knees especially"    Past Surgical History  Procedure Laterality Date    . Hemorrhoid surgery    . Insert / replace / remove pacemaker  09/17/11    initial placement  . Insert / replace / remove pacemaker  09/18/11  . Abdominal hysterectomy    . Tonsillectomy and adenoidectomy    . Fracture surgery  ~ 2007    RLE  . Cardioversion  07/2011  . Eye surgery  2002    "blood clot behind eye"  . Permanent pacemaker insertion N/A 09/17/2011    Procedure: PERMANENT PACEMAKER INSERTION;  Surgeon: Deboraha Sprang, MD;  Location: [facility] CATH LAB;  Service: Cardiovascular;  Laterality: N/A;  . Pacemaker revision N/A 09/18/2011    Procedure: PACEMAKER REVISION;  Surgeon: Thompson Grayer, MD;  Location: [facility] CATH LAB;  Service: Cardiovascular;  Laterality: N/A;    ROS: URI.  Otherwise as stated in the HPI and negative for all other systems.  PHYSICAL EXAM BP 130/70 mmHg  Ht 5' (1.524 m)  Wt 142 lb (64.411 kg)  BMI 27.73 kg/m2 GEN:  No distress NECK:  No jugular venous distention at 45 degrees, waveform within normal limits, carotid upstroke brisk and symmetric, no bruits, no thyromegaly LUNGS:  Clear to auscultation bilaterally BACK:  No CVA tenderness CHEST:  Well healed pacemaker pocket. HEART:  S1 and S2 within normal  limits, no S3, no clicks, no rubs, soft apical systolic murmur ABD:  Positive bowel sounds normal in frequency in pitch, no bruits, no rebound, no guarding, no midline pulsatile mass or hepatomeally EXT:  2 plus pulses throughout, mild edema, no cyanosis no clubbing   ASSESSMENT AND PLAN  CARDIOMYOPATHY -  She seems to be euvolemic. At this point, no change in therapy is indicated.   HYPERTENSION -  The blood pressure is OK today.  However, this is unusual.  No change in medications is indicated. We will continue with therapeutic lifestyle changes (TLC).  Tachycardia-bradycardia syndrome -  She's had no symptomatic tachyarrhythmias. She remains on amiodarone and she is up to date with follow up. She tolerates anticoagulation.  I will check a liver profile and  TSH.    Dyslipidemia - Her lipids are excellent and she should continue the current meds.

## 2014-11-26 NOTE — Patient Instructions (Signed)
Your physician recommends that you schedule a follow-up appointment in: 6 months with Dr. Hochrein  

## 2014-11-27 ENCOUNTER — Other Ambulatory Visit: Payer: Self-pay | Admitting: Cardiology

## 2014-11-27 NOTE — Telephone Encounter (Signed)
°  1. Which medications need to be refilled? Warfarin  2. Which pharmacy is medication to be sent to?[facility] in [place]  3. Do they need a 30 day or 90 day supply? Did not specify  4. Would they like a call back once the medication has been sent to the pharmacy? yes

## 2014-11-28 ENCOUNTER — Telehealth: Payer: Self-pay | Admitting: Cardiology

## 2014-11-28 ENCOUNTER — Encounter: Payer: Medicare Other | Admitting: *Deleted

## 2014-11-28 NOTE — Telephone Encounter (Signed)
Spoke with pt and reminded pt of remote transmission that is due today. Pt verbalized understanding.   

## 2014-11-29 ENCOUNTER — Encounter: Payer: Self-pay | Admitting: Cardiology

## 2014-11-29 ENCOUNTER — Other Ambulatory Visit: Payer: Self-pay

## 2014-11-29 MED ORDER — ATORVASTATIN CALCIUM 10 MG PO TABS: 10.0000 mg | ORAL_TABLET | Freq: Every day | ORAL | Status: DC

## 2014-12-03 ENCOUNTER — Telehealth: Payer: Self-pay | Admitting: Internal Medicine

## 2014-12-03 NOTE — Telephone Encounter (Signed)
New message       Did we get patient's remote check?

## 2014-12-03 NOTE — Telephone Encounter (Signed)
Spoke w/ pt daughter and informed her that transmission was not received. Pt daughter aware that wirex was ordered on 10-23-14 and she should have wirex between 3-22 or 4-5. She will send transmission once wirex is received.

## 2014-12-07 ENCOUNTER — Encounter: Payer: Self-pay | Admitting: Internal Medicine

## 2014-12-23 ENCOUNTER — Encounter: Payer: Self-pay | Admitting: Internal Medicine

## 2014-12-23 DIAGNOSIS — I495 Sick sinus syndrome: Secondary | ICD-10-CM | POA: Diagnosis not present

## 2014-12-23 LAB — MDC_IDC_ENUM_SESS_TYPE_REMOTE
Battery Impedance: 201 Ohm
Battery Remaining Longevity: 119 mo
Battery Voltage: 2.78 V
Brady Statistic AP VP Percent: 1 %
Brady Statistic AP VS Percent: 85 %
Brady Statistic AS VP Percent: 0 %
Brady Statistic AS VS Percent: 14 %
Date Time Interrogation Session: 20160410194630
Lead Channel Impedance Value: 417 Ohm
Lead Channel Impedance Value: 783 Ohm
Lead Channel Pacing Threshold Amplitude: 0.375 V
Lead Channel Pacing Threshold Amplitude: 0.5 V
Lead Channel Pacing Threshold Pulse Width: 0.4 ms
Lead Channel Pacing Threshold Pulse Width: 0.4 ms
Lead Channel Sensing Intrinsic Amplitude: 22.4 mV
Lead Channel Setting Pacing Amplitude: 2 V
Lead Channel Setting Pacing Amplitude: 2.5 V
Lead Channel Setting Pacing Pulse Width: 0.4 ms
Lead Channel Setting Sensing Sensitivity: 5.6 mV

## 2014-12-24 ENCOUNTER — Ambulatory Visit (INDEPENDENT_AMBULATORY_CARE_PROVIDER_SITE_OTHER): Payer: Medicare Other | Admitting: *Deleted

## 2014-12-24 DIAGNOSIS — I495 Sick sinus syndrome: Secondary | ICD-10-CM

## 2014-12-25 NOTE — Progress Notes (Signed)
Remote pacemaker transmission.   

## 2014-12-26 ENCOUNTER — Ambulatory Visit (INDEPENDENT_AMBULATORY_CARE_PROVIDER_SITE_OTHER): Payer: Medicare Other | Admitting: *Deleted

## 2014-12-26 DIAGNOSIS — I4891 Unspecified atrial fibrillation: Secondary | ICD-10-CM | POA: Diagnosis not present

## 2014-12-26 DIAGNOSIS — Z5181 Encounter for therapeutic drug level monitoring: Secondary | ICD-10-CM

## 2014-12-26 LAB — POCT INR: INR: 2.4

## 2014-12-31 ENCOUNTER — Other Ambulatory Visit: Payer: Self-pay | Admitting: Cardiology

## 2014-12-31 MED ORDER — POTASSIUM CHLORIDE CRYS ER 20 MEQ PO TBCR: EXTENDED_RELEASE_TABLET | ORAL | Status: DC

## 2014-12-31 NOTE — Telephone Encounter (Signed)
°  1. Which medications need to be refilled? Potassium chloride  2. Which pharmacy is medication to be sent to?[facility] in [place]  3. Do they need a 30 day or 90 day supply? 90  4. Would they like a call back once the medication has been sent to the pharmacy? yes

## 2014-12-31 NOTE — Telephone Encounter (Signed)
Rx(s) sent to pharmacy electronically. Patient notified. 

## 2015-01-08 DIAGNOSIS — M150 Primary generalized (osteo)arthritis: Secondary | ICD-10-CM | POA: Diagnosis not present

## 2015-01-08 DIAGNOSIS — I4891 Unspecified atrial fibrillation: Secondary | ICD-10-CM | POA: Diagnosis not present

## 2015-01-08 DIAGNOSIS — R5381 Other malaise: Secondary | ICD-10-CM | POA: Diagnosis not present

## 2015-01-08 DIAGNOSIS — R5383 Other fatigue: Secondary | ICD-10-CM | POA: Diagnosis not present

## 2015-01-22 ENCOUNTER — Encounter: Payer: Self-pay | Admitting: Cardiology

## 2015-01-28 ENCOUNTER — Other Ambulatory Visit: Payer: Self-pay | Admitting: Cardiology

## 2015-02-05 ENCOUNTER — Ambulatory Visit (INDEPENDENT_AMBULATORY_CARE_PROVIDER_SITE_OTHER): Payer: Medicare Other | Admitting: Surgery

## 2015-02-05 ENCOUNTER — Encounter: Payer: Self-pay | Admitting: Cardiology

## 2015-02-05 DIAGNOSIS — Z5181 Encounter for therapeutic drug level monitoring: Secondary | ICD-10-CM | POA: Diagnosis not present

## 2015-02-05 DIAGNOSIS — I4891 Unspecified atrial fibrillation: Secondary | ICD-10-CM

## 2015-02-05 LAB — POCT INR: INR: 3.1

## 2015-02-13 ENCOUNTER — Telehealth: Payer: Self-pay | Admitting: Cardiology

## 2015-02-13 NOTE — Telephone Encounter (Signed)
Pt has coumadin clinic visit on 6/7 and office visit w/ Hochrein on 6/15 - want to do coumadin check on same day as physician appt.  Routing to [place] to advise.

## 2015-02-13 NOTE — Telephone Encounter (Signed)
She wants to know if pt can have her Coumadin the same day of her appt,02-27-15? Pt Coumadin was 3.1 last week,should she keep the appt on 6-7?

## 2015-02-13 NOTE — Telephone Encounter (Signed)
appt moved to 6.15 with Dr. Percival Spanish visit

## 2015-02-27 ENCOUNTER — Encounter: Payer: Self-pay | Admitting: Cardiology

## 2015-02-27 ENCOUNTER — Ambulatory Visit (INDEPENDENT_AMBULATORY_CARE_PROVIDER_SITE_OTHER): Payer: Medicare Other | Admitting: Cardiology

## 2015-02-27 ENCOUNTER — Ambulatory Visit (INDEPENDENT_AMBULATORY_CARE_PROVIDER_SITE_OTHER): Payer: Medicare Other | Admitting: Pharmacist Clinician (PhC)/ Clinical Pharmacy Specialist

## 2015-02-27 ENCOUNTER — Ambulatory Visit: Payer: Medicare Other | Admitting: Pharmacist Clinician (PhC)/ Clinical Pharmacy Specialist

## 2015-02-27 VITALS — BP 142/92 | HR 84 | Ht 60.0 in | Wt 140.0 lb

## 2015-02-27 DIAGNOSIS — I4891 Unspecified atrial fibrillation: Secondary | ICD-10-CM

## 2015-02-27 DIAGNOSIS — I10 Essential (primary) hypertension: Secondary | ICD-10-CM

## 2015-02-27 DIAGNOSIS — Z5181 Encounter for therapeutic drug level monitoring: Secondary | ICD-10-CM

## 2015-02-27 LAB — POCT INR: INR: 2.6

## 2015-02-27 NOTE — Progress Notes (Signed)
HPI The patient presents after followup of had tachybradycardia syndrome.  Since I last saw her she has done OK.  She does her activities of daily living and has no complaints. She goes to church and the grocery store.  The patient denies any new symptoms such as chest discomfort, neck or arm discomfort. There has been no new shortness of breath, PND or orthopnea. There have been no reported palpitations, presyncope or syncope.  She did not have an oral lesion biopsy as was planned.    No Known Allergies  Current Outpatient Prescriptions  Medication Sig Dispense Refill  . amiodarone (PACERONE) 200 MG tablet TAKE 1/2 TABLET BY MOUTH EVERY DAY FOR 5 DAYS A WEEK AS DIRECTED 30 tablet 3  . atorvastatin (LIPITOR) 10 MG tablet Take 1 tablet (10 mg total) by mouth daily. 90 tablet 1  . furosemide (LASIX) 80 MG tablet TAKE 1 TABLET BY MOUTH EVERY DAY 90 tablet 1  . lisinopril (PRINIVIL,ZESTRIL) 5 MG tablet Take 1 tablet (5 mg total) by mouth daily. 90 tablet 1  . Multiple Vitamin (MULTIVITAMIN) tablet Take 1 tablet by mouth daily.      . Olopatadine HCl (PATADAY) 0.2 % SOLN Apply to eye as directed.    . potassium chloride SA (K-DUR,KLOR-CON) 20 MEQ tablet TAKE ONE TABLET BY MOUTH ONE TIME DAILY 90 tablet 3  . travoprost, benzalkonium, (TRAVATAN) 0.004 % ophthalmic solution Place 1 drop into both eyes at bedtime. 2.5 mL 2  . warfarin (COUMADIN) 2 MG tablet Take 1.5 to 2 tablets by mouth daily as directed by coumadin clinic 50 tablet 3   No current facility-administered medications for this visit.    Past Medical History  Diagnosis Date  . Malleolar fracture   . Hypertension   . Dementia     mild  . Dyslipidemia   . Glaucoma   . Anemia   . Cardiomyopathy     presumed ischemic in the past with an EF of 35%. The most recent EF in 03-2008, however, was 55 -60%  . Chronic renal insufficiency   . Bradycardia   . Paroxysmal atrial fibrillation   . Arthritis     "knees especially"    Past  Surgical History  Procedure Laterality Date  . Hemorrhoid surgery    . Insert / replace / remove pacemaker  09/17/11    initial placement  . Insert / replace / remove pacemaker  09/18/11  . Abdominal hysterectomy    . Tonsillectomy and adenoidectomy    . Fracture surgery  ~ 2007    RLE  . Cardioversion  07/2011  . Eye surgery  2002    "blood clot behind eye"  . Permanent pacemaker insertion N/A 09/17/2011    Procedure: PERMANENT PACEMAKER INSERTION;  Surgeon: Deboraha Sprang, MD;  Location: [facility] CATH LAB;  Service: Cardiovascular;  Laterality: N/A;  . Pacemaker revision N/A 09/18/2011    Procedure: PACEMAKER REVISION;  Surgeon: Thompson Grayer, MD;  Location: [facility] CATH LAB;  Service: Cardiovascular;  Laterality: N/A;    ROS: As stated in the HPI and negative for all other systems.  PHYSICAL EXAM BP 142/92 mmHg  Pulse 84  Ht 5' (1.524 m)  Wt 140 lb (63.504 kg)  BMI 27.34 kg/m2 GEN:  No distress NECK:  No jugular venous distention at 45 degrees, waveform within normal limits, carotid upstroke brisk and symmetric, no bruits, no thyromegaly LUNGS:  Clear to auscultation bilaterally BACK:  No CVA tenderness CHEST:  Well healed pacemaker pocket.  HEART:  S1 and S2 within normal limits, no S3, no clicks, no rubs, soft apical systolic murmur ABD:  Positive bowel sounds normal in frequency in pitch, no bruits, no rebound, no guarding, no midline pulsatile mass or hepatomeally EXT:  2 plus pulses throughout, mild edema, no cyanosis no clubbing   ASSESSMENT AND PLAN  CARDIOMYOPATHY -  She seems to be euvolemic. At this point, no change in therapy is indicated.   HYPERTENSION -  The blood pressure is OK today.  However, this is unusual.  No change in medications is indicated. We will continue with therapeutic lifestyle changes (TLC).  Tachycardia-bradycardia syndrome -  She's had no symptomatic tachyarrhythmias. She remains on amiodarone and she is up to date with follow up. She tolerates  anticoagulation.  I will order a CBC for the next time she comes back to the office for a warfarin check.  Lab Results  Component Value Date   TSH 3.399 11/26/2014   ALT 12 11/26/2014   AST 15 11/26/2014   ALKPHOS 51 11/26/2014   BILITOT 1.2 11/26/2014   PROT 6.4 11/26/2014   ALBUMIN 4.0 11/26/2014   Dyslipidemia - Her lipids are excellent last year and she should continue the current meds.

## 2015-02-27 NOTE — Patient Instructions (Signed)
Your physician recommends that you schedule a follow-up appointment in: December 2016  Your physician recommends that you return for lab work CBC

## 2015-03-11 ENCOUNTER — Other Ambulatory Visit: Payer: Self-pay

## 2015-03-13 ENCOUNTER — Other Ambulatory Visit: Payer: Self-pay | Admitting: Cardiology

## 2015-03-26 ENCOUNTER — Ambulatory Visit (INDEPENDENT_AMBULATORY_CARE_PROVIDER_SITE_OTHER): Payer: Medicare Other | Admitting: *Deleted

## 2015-03-26 ENCOUNTER — Other Ambulatory Visit: Payer: Medicare Other

## 2015-03-26 DIAGNOSIS — I4891 Unspecified atrial fibrillation: Secondary | ICD-10-CM

## 2015-03-26 DIAGNOSIS — Z5181 Encounter for therapeutic drug level monitoring: Secondary | ICD-10-CM | POA: Diagnosis not present

## 2015-03-26 LAB — CBC WITH DIFFERENTIAL/PLATELET
Basophils Absolute: 0 10*3/uL (ref 0.0–0.1)
Basophils Relative: 0.3 % (ref 0.0–3.0)
Eosinophils Absolute: 0 10*3/uL (ref 0.0–0.7)
Eosinophils Relative: 0.2 % (ref 0.0–5.0)
HCT: 35.3 % — ABNORMAL LOW (ref 36.0–46.0)
Hemoglobin: 11.7 g/dL — ABNORMAL LOW (ref 12.0–15.0)
Lymphocytes Relative: 12.5 % (ref 12.0–46.0)
Lymphs Abs: 1 10*3/uL (ref 0.7–4.0)
MCHC: 33.1 g/dL (ref 30.0–36.0)
MCV: 96 fl (ref 78.0–100.0)
Monocytes Absolute: 0.4 10*3/uL (ref 0.1–1.0)
Monocytes Relative: 5 % (ref 3.0–12.0)
Neutro Abs: 6.5 10*3/uL (ref 1.4–7.7)
Neutrophils Relative %: 82 % — ABNORMAL HIGH (ref 43.0–77.0)
Platelets: 153 10*3/uL (ref 150.0–400.0)
RBC: 3.68 Mil/uL — ABNORMAL LOW (ref 3.87–5.11)
RDW: 14.1 % (ref 11.5–15.5)
WBC: 7.9 10*3/uL (ref 4.0–10.5)

## 2015-03-26 LAB — POCT INR: INR: 2.4

## 2015-04-23 ENCOUNTER — Ambulatory Visit (INDEPENDENT_AMBULATORY_CARE_PROVIDER_SITE_OTHER): Payer: Medicare Other | Admitting: *Deleted

## 2015-04-23 DIAGNOSIS — Z5181 Encounter for therapeutic drug level monitoring: Secondary | ICD-10-CM | POA: Diagnosis not present

## 2015-04-23 DIAGNOSIS — I4891 Unspecified atrial fibrillation: Secondary | ICD-10-CM | POA: Diagnosis not present

## 2015-04-23 LAB — POCT INR: INR: 2

## 2015-05-21 ENCOUNTER — Telehealth: Payer: Self-pay | Admitting: Cardiology

## 2015-05-21 ENCOUNTER — Ambulatory Visit (INDEPENDENT_AMBULATORY_CARE_PROVIDER_SITE_OTHER): Payer: Medicare Other | Admitting: *Deleted

## 2015-05-21 DIAGNOSIS — I4891 Unspecified atrial fibrillation: Secondary | ICD-10-CM

## 2015-05-21 DIAGNOSIS — Z5181 Encounter for therapeutic drug level monitoring: Secondary | ICD-10-CM

## 2015-05-21 LAB — POCT INR: INR: 2.7

## 2015-05-21 NOTE — Telephone Encounter (Signed)
Walk in pt form-Patient requesting refills-placed in refill bin.

## 2015-05-22 ENCOUNTER — Other Ambulatory Visit: Payer: Self-pay

## 2015-05-22 MED ORDER — LISINOPRIL 5 MG PO TABS: 5.0000 mg | ORAL_TABLET | Freq: Every day | ORAL | Status: DC

## 2015-05-22 MED ORDER — AMIODARONE HCL 200 MG PO TABS
ORAL_TABLET | ORAL | Status: DC
Start: 2015-05-22 — End: 2015-06-14

## 2015-05-23 DIAGNOSIS — Z23 Encounter for immunization: Secondary | ICD-10-CM | POA: Diagnosis not present

## 2015-06-06 ENCOUNTER — Other Ambulatory Visit: Payer: Self-pay | Admitting: Cardiology

## 2015-06-14 ENCOUNTER — Ambulatory Visit (INDEPENDENT_AMBULATORY_CARE_PROVIDER_SITE_OTHER): Payer: Medicare Other | Admitting: Internal Medicine

## 2015-06-14 ENCOUNTER — Encounter: Payer: Self-pay | Admitting: Internal Medicine

## 2015-06-14 ENCOUNTER — Ambulatory Visit (INDEPENDENT_AMBULATORY_CARE_PROVIDER_SITE_OTHER): Payer: Medicare Other | Admitting: *Deleted

## 2015-06-14 ENCOUNTER — Other Ambulatory Visit: Payer: Self-pay | Admitting: Internal Medicine

## 2015-06-14 VITALS — BP 122/78 | HR 121 | Ht 60.0 in | Wt 137.0 lb

## 2015-06-14 DIAGNOSIS — Z950 Presence of cardiac pacemaker: Secondary | ICD-10-CM

## 2015-06-14 DIAGNOSIS — I4891 Unspecified atrial fibrillation: Secondary | ICD-10-CM

## 2015-06-14 DIAGNOSIS — I495 Sick sinus syndrome: Secondary | ICD-10-CM | POA: Diagnosis not present

## 2015-06-14 DIAGNOSIS — Z45018 Encounter for adjustment and management of other part of cardiac pacemaker: Secondary | ICD-10-CM

## 2015-06-14 DIAGNOSIS — I480 Paroxysmal atrial fibrillation: Secondary | ICD-10-CM

## 2015-06-14 DIAGNOSIS — Z5181 Encounter for therapeutic drug level monitoring: Secondary | ICD-10-CM

## 2015-06-14 LAB — POCT INR: INR: 3.1

## 2015-06-14 MED ORDER — METOPROLOL TARTRATE 50 MG PO TABS: 50.0000 mg | ORAL_TABLET | Freq: Two times a day (BID) | ORAL | Status: DC

## 2015-06-14 NOTE — Progress Notes (Signed)
Patient Care Team: Chriss Czar, MD as PCP - General (Family Medicine)   HPI  [name] is a 81 y.o. female Seen In followup for pacemaker implanted for tachybradycardia syndrome January 0000000 complicated by atrial lead dislodgment. She had a new lead placed thereafter. She also has atrial fibrillation and is on amiodarone.   She comes in today for routine follow-up and is without symptoms. She denies she is in exercise tolerance. He will  SHe's had no cough or nausea   Past Medical History  Diagnosis Date  . Malleolar fracture   . Hypertension   . Dementia     mild  . Dyslipidemia   . Glaucoma   . Anemia   . Cardiomyopathy     presumed ischemic in the past with an EF of 35%. The most recent EF in 03-2008, however, was 55 -60%  . Chronic renal insufficiency   . Bradycardia   . Paroxysmal atrial fibrillation   . Arthritis     "knees especially"    Past Surgical History  Procedure Laterality Date  . Hemorrhoid surgery    . Insert / replace / remove pacemaker  09/17/11    initial placement  . Insert / replace / remove pacemaker  09/18/11  . Abdominal hysterectomy    . Tonsillectomy and adenoidectomy    . Fracture surgery  ~ 2007    RLE  . Cardioversion  07/2011  . Eye surgery  2002    "blood clot behind eye"  . Permanent pacemaker insertion N/A 09/17/2011    Procedure: PERMANENT PACEMAKER INSERTION;  Surgeon: Deboraha Sprang, MD;  Location: [facility] CATH LAB;  Service: Cardiovascular;  Laterality: N/A;  . Pacemaker revision N/A 09/18/2011    Procedure: PACEMAKER REVISION;  Surgeon: Thompson Grayer, MD;  Location: [facility] CATH LAB;  Service: Cardiovascular;  Laterality: N/A;    Current Outpatient Prescriptions  Medication Sig Dispense Refill  . amiodarone (PACERONE) 200 MG tablet TAKE 1/2 TABLET BY MOUTH EVERY DAY FOR 5 DAYS A WEEK AS DIRECTED 30 tablet 3  . atorvastatin (LIPITOR) 10 MG tablet TAKE 1 TABLET(10 MG) BY MOUTH DAILY 90 tablet 0  . furosemide (LASIX) 80 MG tablet  TAKE 1 TABLET BY MOUTH EVERY DAY 90 tablet 1  . lisinopril (PRINIVIL,ZESTRIL) 5 MG tablet Take 1 tablet (5 mg total) by mouth daily. 90 tablet 0  . Multiple Vitamin (MULTIVITAMIN) tablet Take 1 tablet by mouth daily.      . Olopatadine HCl (PATADAY) 0.2 % SOLN Apply to eye as directed.    . potassium chloride SA (K-DUR,KLOR-CON) 20 MEQ tablet TAKE ONE TABLET BY MOUTH ONE TIME DAILY 90 tablet 3  . travoprost, benzalkonium, (TRAVATAN) 0.004 % ophthalmic solution Place 1 drop into both eyes at bedtime. 2.5 mL 2  . warfarin (COUMADIN) 2 MG tablet Take 1.5 to 2 tablets by mouth daily as directed by coumadin clinic 60 tablet 2   No current facility-administered medications for this visit.    No Known Allergies  Review of Systems negative except from HPI and PMH  Physical Exam BP 122/78 mmHg  Pulse 121  Ht 5' (1.524 m)  Wt 137 lb (62.143 kg)  BMI 26.76 kg/m2 Well developed and well nourished in no acute distress HENT normal E scleral and icterus clear Neck Supple JVP flat; carotids brisk and full Clear to ausculation  Regular rate and rhythm, 2/6 systolic murmur Soft with active bowel sounds No clubbing cyanosis  Edema Alert and oriented, grossly  normal motor and sensory function Skin Warm and Dry  ECG demonstrates atrial fibrillation at 121 Intervals-/12/29 and 5 axis left -25 LVH with repolarization abnormalities and QRS widening  Assessment and  Plan   Atrial fibrillation   persistent    Sinus node dysfunction  Pacemaker- [employer] The patient's device was interrogated.  The information was reviewed. No changes were made in the programming.   Hypertension     blood pressure is normal today    We will continue her on low-dose amiodarone and check her surveillance laboratories today.  She has developed atrial fibrillation which is asymptomatic. This is somewhat surprising given the rapid rate.  We discussed the physiology of atrial fibrillation and the relationship of  both rate and the loss of atrial contractility to impaired cardiac performance. We discussed the strategies of rate control and rhythm control with the potential of pro arrhythmia. We reviewed side effect  profiles of rate controlling drugs, including their idiosyncratic nature as well as the potential for them to reduce blood pressure.  In this context, her daughter was reluctant for her to undergo cardioversion. In the absence of symptoms, we will discontinue rhythm control i.e. amiodarone. We have discussed the impact on her Coumadin and has discussed this with the Coumadin clinic. We have also discussed the impact on rate control and we will begin her on beta blockers. I've given her prescriptions for atenolol 50, metoprolol 50 (tartrate. Succinate).  We will plan to have her come back to the EP clinic in about 6-8 weeks and see A S.  If at that point augmented rate control as necessary, medication can be uptitrated or calcium blockers be added.

## 2015-06-14 NOTE — Patient Instructions (Addendum)
Medication Instructions:  Your physician has recommended you make the following change in your medication:  1) STOP Amidarone  You have been given 3 different prescriptions to try. You may take them in any order. DO NOT take these medications at the same time. Please call the office once you have found which medication works best for you. 1) Metoprolol Tartrate 50 mg twice a day  (This was sent to pharmacy to get you started.  Two week supply sent in per your request) 2) Metoprolol Succinate 50 mg once a day 3) Atenolol 50 mg once a day  Labwork: None ordered  Testing/Procedures: None ordered  Follow-Up: Your physician recommends that you schedule a follow-up appointment on: 06/28/15 at 10:15 a.m. with coumadin clinic.  Your physician recommends that you schedule a follow-up appointment in: 6 weeks with Chanetta Marshall, NP.   Any Other Special Instructions Will Be Listed Below (If Applicable). Thank you for choosing [facility]!!

## 2015-06-14 NOTE — Telephone Encounter (Signed)
Pharmacy is requesting a ninety day supply, but it looks like she will be just doing a trial of this.

## 2015-06-14 NOTE — Telephone Encounter (Signed)
Spoke with pharmacist and explained this was a trial medication.  Informed that we will be glad to send 90 days once we have determined if this trial works/not.

## 2015-06-20 LAB — CUP PACEART INCLINIC DEVICE CHECK
Battery Impedance: 178 Ohm
Battery Remaining Longevity: 122 mo
Battery Voltage: 2.78 V
Brady Statistic AP VP Percent: 1 %
Brady Statistic AP VS Percent: 83 %
Brady Statistic AS VP Percent: 0 %
Brady Statistic AS VS Percent: 15 %
Date Time Interrogation Session: 20160930132344
Lead Channel Impedance Value: 387 Ohm
Lead Channel Impedance Value: 688 Ohm
Lead Channel Sensing Intrinsic Amplitude: 0.7 mV
Lead Channel Sensing Intrinsic Amplitude: 15.67 mV
Lead Channel Setting Pacing Amplitude: 2 V
Lead Channel Setting Pacing Amplitude: 2.5 V
Lead Channel Setting Pacing Pulse Width: 0.4 ms
Lead Channel Setting Sensing Sensitivity: 5.6 mV

## 2015-06-28 ENCOUNTER — Ambulatory Visit (INDEPENDENT_AMBULATORY_CARE_PROVIDER_SITE_OTHER): Payer: Medicare Other | Admitting: *Deleted

## 2015-06-28 ENCOUNTER — Encounter: Payer: Self-pay | Admitting: Internal Medicine

## 2015-06-28 ENCOUNTER — Other Ambulatory Visit: Payer: Self-pay | Admitting: Cardiology

## 2015-06-28 ENCOUNTER — Other Ambulatory Visit: Payer: Self-pay | Admitting: *Deleted

## 2015-06-28 DIAGNOSIS — I4891 Unspecified atrial fibrillation: Secondary | ICD-10-CM

## 2015-06-28 DIAGNOSIS — Z5181 Encounter for therapeutic drug level monitoring: Secondary | ICD-10-CM | POA: Diagnosis not present

## 2015-06-28 LAB — POCT INR: INR: 2.9

## 2015-06-28 MED ORDER — METOPROLOL TARTRATE 50 MG PO TABS: 50.0000 mg | ORAL_TABLET | Freq: Two times a day (BID) | ORAL | Status: DC

## 2015-06-28 NOTE — Progress Notes (Signed)
The patient was seen in [facility] today and stated that metoprolol tartrate 50 mg BID is working well for her. She requested a refill be sent in to the pharmacy. RX refill sent to [facility] in [place].

## 2015-07-15 ENCOUNTER — Other Ambulatory Visit: Payer: Self-pay | Admitting: Cardiology

## 2015-07-15 ENCOUNTER — Telehealth: Payer: Self-pay | Admitting: Cardiology

## 2015-07-15 MED ORDER — WARFARIN SODIUM 2 MG PO TABS: ORAL_TABLET | ORAL | Status: DC

## 2015-07-15 NOTE — Telephone Encounter (Signed)
Rx(s) sent to pharmacy electronically.  

## 2015-07-15 NOTE — Telephone Encounter (Signed)
°  STAT if patient is at the pharmacy , call can be transferred to refill team.   1. Which medications need to be refilled?Warfrain .Marland Kitchen Needs prescription renewal   2. Which pharmacy/location is medication to be sent to?[facility] in [place]   3. Do they need a 30 day or 90 day supply? [place]

## 2015-07-18 ENCOUNTER — Ambulatory Visit (INDEPENDENT_AMBULATORY_CARE_PROVIDER_SITE_OTHER): Payer: Medicare Other

## 2015-07-18 DIAGNOSIS — I4891 Unspecified atrial fibrillation: Secondary | ICD-10-CM

## 2015-07-18 DIAGNOSIS — Z5181 Encounter for therapeutic drug level monitoring: Secondary | ICD-10-CM | POA: Diagnosis not present

## 2015-07-18 LAB — POCT INR: INR: 2

## 2015-07-29 ENCOUNTER — Encounter: Payer: Medicare Other | Admitting: Nurse Practitioner

## 2015-08-05 ENCOUNTER — Ambulatory Visit (INDEPENDENT_AMBULATORY_CARE_PROVIDER_SITE_OTHER): Payer: Medicare Other

## 2015-08-05 ENCOUNTER — Ambulatory Visit (INDEPENDENT_AMBULATORY_CARE_PROVIDER_SITE_OTHER): Payer: Medicare Other | Admitting: Nurse Practitioner

## 2015-08-05 ENCOUNTER — Encounter: Payer: Self-pay | Admitting: Nurse Practitioner

## 2015-08-05 VITALS — BP 130/76 | HR 77 | Ht 60.0 in | Wt 139.4 lb

## 2015-08-05 DIAGNOSIS — I4891 Unspecified atrial fibrillation: Secondary | ICD-10-CM | POA: Diagnosis not present

## 2015-08-05 DIAGNOSIS — I495 Sick sinus syndrome: Secondary | ICD-10-CM

## 2015-08-05 DIAGNOSIS — I480 Paroxysmal atrial fibrillation: Secondary | ICD-10-CM | POA: Diagnosis not present

## 2015-08-05 DIAGNOSIS — Z5181 Encounter for therapeutic drug level monitoring: Secondary | ICD-10-CM | POA: Diagnosis not present

## 2015-08-05 LAB — POCT INR: INR: 2.5

## 2015-08-05 MED ORDER — METOPROLOL TARTRATE 50 MG PO TABS: 75.0000 mg | ORAL_TABLET | Freq: Two times a day (BID) | ORAL | Status: DC

## 2015-08-05 NOTE — Progress Notes (Signed)
Electrophysiology Office Note Date: 08/05/2015  ID:  [name], DOB 1923/03/08, MRN [id number]  PCP: Chriss Czar, MD Primary Cardiologist: Hochrein Electrophysiologist: Caryl Comes  CC: atrial fibrillation follow-up  [name] is a 81 y.o. female seen today for Dr Caryl Comes.  She presents today for routine electrophysiology followup.  Since last being seen in our clinic, the patient reports doing very well.  She denies chest pain, palpitations, dyspnea, PND, orthopnea, nausea, vomiting, dizziness, syncope, edema, weight gain, or early satiety.  Device History: MDT dual chamber PPM implanted 2013 for SSS   Past Medical History  Diagnosis Date  . Malleolar fracture   . Hypertension   . Dementia     mild  . Dyslipidemia   . Glaucoma   . Anemia   . Cardiomyopathy     presumed ischemic in the past with an EF of 35%. The most recent EF in 03-2008, however, was 55 -60%  . Chronic renal insufficiency   . Bradycardia   . Paroxysmal atrial fibrillation (HCC)   . Arthritis     "knees especially"   Past Surgical History  Procedure Laterality Date  . Hemorrhoid surgery    . Insert / replace / remove pacemaker  09/17/11    initial placement  . Insert / replace / remove pacemaker  09/18/11  . Abdominal hysterectomy    . Tonsillectomy and adenoidectomy    . Fracture surgery  ~ 2007    RLE  . Cardioversion  07/2011  . Eye surgery  2002    "blood clot behind eye"  . Permanent pacemaker insertion N/A 09/17/2011    Procedure: PERMANENT PACEMAKER INSERTION;  Surgeon: Deboraha Sprang, MD;  Location: [facility] CATH LAB;  Service: Cardiovascular;  Laterality: N/A;  . Pacemaker revision N/A 09/18/2011    Procedure: PACEMAKER REVISION;  Surgeon: Thompson Grayer, MD;  Location: [facility] CATH LAB;  Service: Cardiovascular;  Laterality: N/A;    Current Outpatient Prescriptions  Medication Sig Dispense Refill  . atorvastatin (LIPITOR) 10 MG tablet TAKE 1 TABLET(10 MG) BY MOUTH DAILY 90 tablet 0  . furosemide  (LASIX) 80 MG tablet TAKE ONE TABLET BY MOUTH EVERY DAY 90 tablet 1  . lisinopril (PRINIVIL,ZESTRIL) 5 MG tablet Take 1 tablet (5 mg total) by mouth daily. 90 tablet 0  . metoprolol (LOPRESSOR) 50 MG tablet Take 1.5 tablets (75 mg total) by mouth 2 (two) times daily. 90 tablet 6  . Multiple Vitamin (MULTIVITAMIN) tablet Take 1 tablet by mouth daily.      . Olopatadine HCl (PATADAY) 0.2 % SOLN Place 1 drop into both eyes daily.     . potassium chloride SA (K-DUR,KLOR-CON) 20 MEQ tablet TAKE ONE TABLET BY MOUTH ONE TIME DAILY 90 tablet 3  . TRAVATAN Z 0.004 % SOLN ophthalmic solution Place 1 drop into both eyes at bedtime.  3  . warfarin (COUMADIN) 2 MG tablet Take 1.5 to 2 tablets by mouth daily as directed by coumadin clinic 180 tablet 0   No current facility-administered medications for this visit.    Allergies:   Review of patient's allergies indicates no known allergies.   Social History: Social History   Social History  . Marital Status: Widowed    Spouse Name: N/A  . Number of Children: N/A  . Years of Education: N/A   Occupational History  .     Social History Main Topics  . Smoking status: Never Smoker   . Smokeless tobacco: Never Used  . Alcohol Use: No  . Drug  Use: No  . Sexual Activity: No   Other Topics Concern  . Not on file   Social History Narrative    Family History: Family History  Problem Relation Age of Onset  . Other      Noncontributory     Review of Systems: All other systems reviewed and are otherwise negative except as noted above.   Physical Exam: VS:  BP 130/76 mmHg  Pulse 77  Ht 5' (1.524 m)  Wt 139 lb 6.4 oz (63.231 kg)  BMI 27.22 kg/m2 , BMI Body mass index is 27.22 kg/(m^2).  GEN- The patient is elderly and thin appearing, alert and oriented x 3 today.   HEENT: normocephalic, atraumatic; sclera clear, conjunctiva pink; hearing intact; oropharynx clear; neck supple Lungs- Clear to ausculation bilaterally, normal work of  breathing.  No wheezes, rales, rhonchi Heart- Irregular rate and rhythm GI- soft, non-tender, non-distended, bowel sounds present Extremities- no clubbing, cyanosis, or edema; DP/PT/radial pulses 2+ bilaterally MS- no significant deformity or atrophy Skin- warm and dry, no rash or lesion; PPM pocket well healed Psych- euthymic mood, full affect Neuro- strength and sensation are intact  PPM Interrogation- reviewed in detail today,  See PACEART report  EKG:  EKG is not ordered today.  Recent Labs: 11/26/2014: ALT 12; TSH 3.399 03/26/2015: Hemoglobin 11.7*; Platelets 153.0   Wt Readings from Last 3 Encounters:  08/05/15 139 lb 6.4 oz (63.231 kg)  06/14/15 137 lb (62.143 kg)  02/27/15 140 lb (63.504 kg)     Other studies Reviewed: Additional studies/ records that were reviewed today include: Dr Olin Pia office notes  Assessment and Plan:  1.  Symptomatic bradycardia Normal PPM function See Pace Art report No changes today  2.  Paroxysmal atrial fibrillation AF burden 26% by today's interrogation V rates not well controlled, but patient asymptomatic Will increase Metoprolol to 75mg  bid today Continue Warfarin for CHADS2VASC of at least 4   Current medicines are reviewed at length with the patient today.   The patient does not have concerns regarding her medicines.  The following changes were made today:  Increase Metoprolol to 75mg  twice daily  Labs/ tests ordered today include: none   Disposition:   Follow up with [facility] transmissions, Dr Caryl Comes in 1 year    Signed, Chanetta Marshall, NP 08/05/2015 12:01 PM  [name] [id number] [phone] (office) [phone] (fax)

## 2015-08-05 NOTE — Patient Instructions (Addendum)
Medication Instructions:  START TAKING ONE TABLE AND HALF TWICE A DAY  OF METOPROLOL    If you need a refill on your cardiac medications before your next appointment, please call your pharmacy.  Labwork: NONE ORDER TODAY    Testing/Procedures: NONE ORDER TODAY    Follow-Up:  Your physician wants you to follow-up in:  [place] will receive a reminder letter in the mail two months in advance. If you don't receive a letter, please call our office to schedule the follow-up appointment.  Remote monitoring is used to monitor your Pacemaker of ICD from home. This monitoring reduces the number of office visits required to check your device to one time per year. It allows [place] to keep an eye on the functioning of your device to ensure it is working properly. You are scheduled for a device check from home on  You may send your transmission at any time that day. If you have a wireless device, the transmission will be sent automatically. After your physician reviews your transmission, you will receive a postcard with your next transmission date. 09/04/15         Any Other Special Instructions Will Be Listed Below (If Applicable).

## 2015-08-16 ENCOUNTER — Ambulatory Visit: Payer: Medicare Other | Admitting: Cardiology

## 2015-08-18 ENCOUNTER — Other Ambulatory Visit: Payer: Self-pay | Admitting: Cardiology

## 2015-08-19 NOTE — Telephone Encounter (Signed)
°*  STAT* If patient is at the pharmacy, call can be transferred to refill team.   1. Which medications need to be refilled? (please list name of each medication and dose if known) Metoprolol,Lisinopril and Furosemide2. Which pharmacy/location (including street and city if local pharmacy) is medication to be sent to?[phone]  3. Do they need a 30 day or 90 day supply? 90 and refill

## 2015-08-19 NOTE — Telephone Encounter (Signed)
REFILL 

## 2015-08-26 ENCOUNTER — Ambulatory Visit (INDEPENDENT_AMBULATORY_CARE_PROVIDER_SITE_OTHER): Payer: Medicare Other | Admitting: Cardiology

## 2015-08-26 ENCOUNTER — Encounter: Payer: Self-pay | Admitting: Cardiology

## 2015-08-26 ENCOUNTER — Ambulatory Visit (INDEPENDENT_AMBULATORY_CARE_PROVIDER_SITE_OTHER): Payer: Medicare Other | Admitting: Pharmacist Clinician (PhC)/ Clinical Pharmacy Specialist

## 2015-08-26 VITALS — BP 138/94 | HR 82 | Ht 60.0 in | Wt 139.0 lb

## 2015-08-26 DIAGNOSIS — I4891 Unspecified atrial fibrillation: Secondary | ICD-10-CM

## 2015-08-26 DIAGNOSIS — I480 Paroxysmal atrial fibrillation: Secondary | ICD-10-CM

## 2015-08-26 DIAGNOSIS — Z5181 Encounter for therapeutic drug level monitoring: Secondary | ICD-10-CM

## 2015-08-26 DIAGNOSIS — E785 Hyperlipidemia, unspecified: Secondary | ICD-10-CM | POA: Diagnosis not present

## 2015-08-26 DIAGNOSIS — Z79899 Other long term (current) drug therapy: Secondary | ICD-10-CM | POA: Diagnosis not present

## 2015-08-26 LAB — POCT INR: INR: 2.2

## 2015-08-26 NOTE — Patient Instructions (Signed)
Your physician recommends that you schedule a follow-up appointment in: February, 2017  Your physician recommends that you return for lab work in: [place] and Fasting Lipids  Angela Nevin [place] and [place]!!

## 2015-08-26 NOTE — Progress Notes (Signed)
HPI The patient presents after followup of had tachybradycardia syndrome.  Since I last saw her she has had her pacemaker interrogated.  She was found to have 26% atrial fib with rate at times not well controlled.  She had her beta blocker dose increased.  In September she was taken off of amiodarone by Dr. Caryl Comes.  The patient denies any new symptoms such as chest discomfort, neck or arm discomfort. There has been no new shortness of breath, PND or orthopnea. There have been no reported palpitations, presyncope or syncope.   No Known Allergies  Current Outpatient Prescriptions  Medication Sig Dispense Refill  . atorvastatin (LIPITOR) 10 MG tablet TAKE 1 TABLET(10 MG) BY MOUTH DAILY 90 tablet 0  . furosemide (LASIX) 80 MG tablet TAKE ONE TABLET BY MOUTH EVERY DAY 90 tablet 3  . lisinopril (PRINIVIL,ZESTRIL) 5 MG tablet TAKE 1 TABLET(5 MG) BY MOUTH DAILY 90 tablet 3  . metoprolol (LOPRESSOR) 50 MG tablet Take 1.5 tablets (75 mg total) by mouth 2 (two) times daily. 90 tablet 6  . Multiple Vitamin (MULTIVITAMIN) tablet Take 1 tablet by mouth daily.      . Olopatadine HCl (PATADAY) 0.2 % SOLN Place 1 drop into both eyes daily.     . potassium chloride SA (K-DUR,KLOR-CON) 20 MEQ tablet TAKE ONE TABLET BY MOUTH ONE TIME DAILY 90 tablet 3  . TRAVATAN Z 0.004 % SOLN ophthalmic solution Place 1 drop into both eyes at bedtime.  3  . warfarin (COUMADIN) 2 MG tablet Take 1.5 to 2 tablets by mouth daily as directed by coumadin clinic 180 tablet 0   No current facility-administered medications for this visit.    Past Medical History  Diagnosis Date  . Malleolar fracture   . Hypertension   . Dementia     mild  . Dyslipidemia   . Glaucoma   . Anemia   . Cardiomyopathy     presumed ischemic in the past with an EF of 35%. The most recent EF in 03-2008, however, was 55 -60%  . Chronic renal insufficiency   . Bradycardia   . Paroxysmal atrial fibrillation (HCC)   . Arthritis     "knees especially"      Past Surgical History  Procedure Laterality Date  . Hemorrhoid surgery    . Insert / replace / remove pacemaker  09/17/11    initial placement  . Insert / replace / remove pacemaker  09/18/11  . Abdominal hysterectomy    . Tonsillectomy and adenoidectomy    . Fracture surgery  ~ 2007    RLE  . Cardioversion  07/2011  . Eye surgery  2002    "blood clot behind eye"  . Permanent pacemaker insertion N/A 09/17/2011    Procedure: PERMANENT PACEMAKER INSERTION;  Surgeon: Deboraha Sprang, MD;  Location: [facility] CATH LAB;  Service: Cardiovascular;  Laterality: N/A;  . Pacemaker revision N/A 09/18/2011    Procedure: PACEMAKER REVISION;  Surgeon: Thompson Grayer, MD;  Location: [facility] CATH LAB;  Service: Cardiovascular;  Laterality: N/A;    ROS: As stated in the HPI and negative for all other systems.  PHYSICAL EXAM BP 138/94 mmHg  Pulse 82  Ht 5' (1.524 m)  Wt 139 lb (63.05 kg)  BMI 27.15 kg/m2 GEN:  No distress NECK:  No jugular venous distention at 45 degrees, waveform within normal limits, carotid upstroke brisk and symmetric, no bruits, no thyromegaly LUNGS:  Clear to auscultation bilaterally BACK:  No CVA tenderness CHEST:  Well  healed pacemaker pocket. HEART:  S1 and S2 within normal limits, no S3, no clicks, no rubs, soft apical systolic murmur ABD:  Positive bowel sounds normal in frequency in pitch, no bruits, no rebound, no guarding, no midline pulsatile mass or hepatomeally EXT:  2 plus pulses throughout, mild edema, no cyanosis no clubbing   ASSESSMENT AND PLAN  CARDIOMYOPATHY -  She seems to be euvolemic. At this point, no change in therapy is indicated.   HYPERTENSION -  The blood pressure is OK today.  No change in medications is indicated. We will continue with therapeutic lifestyle changes (TLC).  Tachycardia-bradycardia syndrome -  She's had no symptomatic tachyarrhythmias. She is now off of amiodarone. She tolerates anticoagulation.   Dyslipidemia - I will check a lipid  when she comes back in 2 months.

## 2015-09-02 ENCOUNTER — Other Ambulatory Visit: Payer: Self-pay | Admitting: Cardiology

## 2015-09-02 NOTE — Telephone Encounter (Signed)
REFILL 

## 2015-09-26 ENCOUNTER — Ambulatory Visit (INDEPENDENT_AMBULATORY_CARE_PROVIDER_SITE_OTHER): Payer: Medicare Other

## 2015-09-26 DIAGNOSIS — Z5181 Encounter for therapeutic drug level monitoring: Secondary | ICD-10-CM

## 2015-09-26 DIAGNOSIS — I4891 Unspecified atrial fibrillation: Secondary | ICD-10-CM

## 2015-09-26 DIAGNOSIS — I480 Paroxysmal atrial fibrillation: Secondary | ICD-10-CM

## 2015-09-26 LAB — POCT INR: INR: 2.2

## 2015-09-30 ENCOUNTER — Encounter: Payer: Self-pay | Admitting: Internal Medicine

## 2015-10-22 ENCOUNTER — Ambulatory Visit (INDEPENDENT_AMBULATORY_CARE_PROVIDER_SITE_OTHER): Payer: Medicare Other | Admitting: *Deleted

## 2015-10-22 DIAGNOSIS — I480 Paroxysmal atrial fibrillation: Secondary | ICD-10-CM

## 2015-10-22 DIAGNOSIS — I4891 Unspecified atrial fibrillation: Secondary | ICD-10-CM

## 2015-10-22 DIAGNOSIS — Z5181 Encounter for therapeutic drug level monitoring: Secondary | ICD-10-CM | POA: Diagnosis not present

## 2015-10-22 LAB — POCT INR: INR: 1.9

## 2015-10-30 NOTE — Progress Notes (Signed)
HPI The patient presents after followup of had tachybradycardia syndrome.  Since I last saw her she has done well.  The patient denies any new symptoms such as chest discomfort, neck or arm discomfort. There has been no new shortness of breath, PND or orthopnea. There have been no reported palpitations, presyncope or syncope.   She goes to church and shopping and out to dinner.    No Known Allergies  Current Outpatient Prescriptions  Medication Sig Dispense Refill  . atorvastatin (LIPITOR) 10 MG tablet TAKE 1 TABLET BY MOUTH EVERY DAY. PLEASE CALL AND SCHEDULE A DECEMBER APPT. 90 tablet 0  . furosemide (LASIX) 80 MG tablet TAKE ONE TABLET BY MOUTH EVERY DAY 90 tablet 3  . lisinopril (PRINIVIL,ZESTRIL) 5 MG tablet TAKE 1 TABLET(5 MG) BY MOUTH DAILY 90 tablet 3  . metoprolol (LOPRESSOR) 50 MG tablet Take 1.5 tablets (75 mg total) by mouth 2 (two) times daily. 90 tablet 6  . Multiple Vitamin (MULTIVITAMIN) tablet Take 1 tablet by mouth daily.      . Olopatadine HCl (PATADAY) 0.2 % SOLN Place 1 drop into both eyes daily.     . potassium chloride SA (K-DUR,KLOR-CON) 20 MEQ tablet TAKE ONE TABLET BY MOUTH ONE TIME DAILY 90 tablet 3  . TRAVATAN Z 0.004 % SOLN ophthalmic solution Place 1 drop into both eyes at bedtime.  3  . warfarin (COUMADIN) 2 MG tablet Take 1.5 to 2 tablets by mouth daily as directed by coumadin clinic 180 tablet 0   No current facility-administered medications for this visit.    Past Medical History  Diagnosis Date  . Malleolar fracture   . Hypertension   . Dementia     mild  . Dyslipidemia   . Glaucoma   . Anemia   . Cardiomyopathy     presumed ischemic in the past with an EF of 35%. The most recent EF in 03-2008, however, was 55 -60%  . Chronic renal insufficiency   . Bradycardia   . Paroxysmal atrial fibrillation (HCC)   . Arthritis     "knees especially"    Past Surgical History  Procedure Laterality Date  . Hemorrhoid surgery    . Insert / replace /  remove pacemaker  09/17/11    initial placement  . Insert / replace / remove pacemaker  09/18/11  . Abdominal hysterectomy    . Tonsillectomy and adenoidectomy    . Fracture surgery  ~ 2007    RLE  . Cardioversion  07/2011  . Eye surgery  2002    "blood clot behind eye"  . Permanent pacemaker insertion N/A 09/17/2011    Procedure: PERMANENT PACEMAKER INSERTION;  Surgeon: Deboraha Sprang, MD;  Location: [facility] CATH LAB;  Service: Cardiovascular;  Laterality: N/A;  . Pacemaker revision N/A 09/18/2011    Procedure: PACEMAKER REVISION;  Surgeon: Thompson Grayer, MD;  Location: [facility] CATH LAB;  Service: Cardiovascular;  Laterality: N/A;    ROS: As stated in the HPI and negative for all other systems.  PHYSICAL EXAM BP 144/92 mmHg  Pulse 82  Ht 5' (1.524 m)  Wt 140 lb 3 oz (63.589 kg)  BMI 27.38 kg/m2 GEN:  No distress NECK:  No jugular venous distention at 45 degrees, waveform within normal limits, carotid upstroke brisk and symmetric, no bruits, no thyromegaly LUNGS:  Clear to auscultation bilaterally BACK:  No CVA tenderness CHEST:  Well healed pacemaker pocket. HEART:  S1 and S2 within normal limits, no S3, no clicks, no rubs,  soft apical systolic murmur ABD:  Positive bowel sounds normal in frequency in pitch, no bruits, no rebound, no guarding, no midline pulsatile mass or hepatomeally EXT:  2 plus pulses throughout, mild edema, no cyanosis no clubbing   ASSESSMENT AND PLAN  CARDIOMYOPATHY -  She seems to be euvolemic. At this point, no change in therapy is indicated.   I will check a BMET and CBC.    HYPERTENSION -  The blood pressure is OK today.  No change in medications is indicated. We will continue with therapeutic lifestyle changes (TLC).  Tachycardia-bradycardia syndrome -  She's had no symptomatic tachyarrhythmias. She tolerates anticoagulation.  No change in therapy is planned.   Dyslipidemia - I will follow up with a lipid profile.

## 2015-10-31 ENCOUNTER — Ambulatory Visit (INDEPENDENT_AMBULATORY_CARE_PROVIDER_SITE_OTHER): Payer: Medicare Other | Admitting: Pharmacist Clinician (PhC)/ Clinical Pharmacy Specialist

## 2015-10-31 ENCOUNTER — Ambulatory Visit (INDEPENDENT_AMBULATORY_CARE_PROVIDER_SITE_OTHER): Payer: Medicare Other | Admitting: Cardiology

## 2015-10-31 ENCOUNTER — Encounter: Payer: Self-pay | Admitting: Cardiology

## 2015-10-31 VITALS — BP 144/92 | HR 82 | Ht 60.0 in | Wt 140.2 lb

## 2015-10-31 DIAGNOSIS — I4891 Unspecified atrial fibrillation: Secondary | ICD-10-CM

## 2015-10-31 DIAGNOSIS — I480 Paroxysmal atrial fibrillation: Secondary | ICD-10-CM

## 2015-10-31 DIAGNOSIS — Z5181 Encounter for therapeutic drug level monitoring: Secondary | ICD-10-CM

## 2015-10-31 DIAGNOSIS — E785 Hyperlipidemia, unspecified: Secondary | ICD-10-CM | POA: Diagnosis not present

## 2015-10-31 LAB — LIPID PANEL
Cholesterol: 135 mg/dL (ref 125–200)
HDL: 96 mg/dL (ref >=46)
LDL Cholesterol: 28 mg/dL (ref <130)
Total CHOL/HDL Ratio: 1.4 Ratio (ref <=5.0)
Triglycerides: 55 mg/dL (ref <150)
VLDL: 11 mg/dL (ref <30)

## 2015-10-31 LAB — BASIC METABOLIC PANEL
BUN: 31 mg/dL — ABNORMAL HIGH (ref 7–25)
CO2: 32 mmol/L — ABNORMAL HIGH (ref 20–31)
Calcium: 9.9 mg/dL (ref 8.6–10.4)
Chloride: 103 mmol/L (ref 98–110)
Creat: 1.15 mg/dL — ABNORMAL HIGH (ref 0.60–0.88)
Glucose, Bld: 108 mg/dL — ABNORMAL HIGH (ref 65–99)
Potassium: 4.3 mmol/L (ref 3.5–5.3)
Sodium: 144 mmol/L (ref 135–146)

## 2015-10-31 LAB — CBC
HCT: 36.9 % (ref 36.0–46.0)
Hemoglobin: 12 g/dL (ref 12.0–15.0)
MCH: 30.7 pg (ref 26.0–34.0)
MCHC: 32.5 g/dL (ref 30.0–36.0)
MCV: 94.4 fL (ref 78.0–100.0)
MPV: 12 fL (ref 8.6–12.4)
Platelets: 184 10*3/uL (ref 150–400)
RBC: 3.91 MIL/uL (ref 3.87–5.11)
RDW: 14.1 % (ref 11.5–15.5)
WBC: 7.6 10*3/uL (ref 4.0–10.5)

## 2015-10-31 LAB — POCT INR: INR: 2.4

## 2015-10-31 NOTE — Patient Instructions (Signed)
Your physician recommends that you return for lab work at your earliest [place].  Dr Percival Spanish recommends that you schedule a follow-up appointment in 2 months.  If you need a refill on your cardiac medications before your next appointment, please call your pharmacy.

## 2015-11-05 ENCOUNTER — Other Ambulatory Visit: Payer: Self-pay | Admitting: Cardiology

## 2015-11-13 DIAGNOSIS — H25013 Cortical age-related cataract, bilateral: Secondary | ICD-10-CM | POA: Diagnosis not present

## 2015-11-28 ENCOUNTER — Ambulatory Visit (INDEPENDENT_AMBULATORY_CARE_PROVIDER_SITE_OTHER): Payer: Medicare Other | Admitting: Pharmacist

## 2015-11-28 DIAGNOSIS — Z5181 Encounter for therapeutic drug level monitoring: Secondary | ICD-10-CM | POA: Diagnosis not present

## 2015-11-28 DIAGNOSIS — I4891 Unspecified atrial fibrillation: Secondary | ICD-10-CM | POA: Diagnosis not present

## 2015-11-28 DIAGNOSIS — I480 Paroxysmal atrial fibrillation: Secondary | ICD-10-CM | POA: Diagnosis not present

## 2015-11-28 LAB — POCT INR: INR: 2.1

## 2015-12-15 ENCOUNTER — Emergency Department (HOSPITAL_COMMUNITY): Payer: Medicare Other

## 2015-12-15 ENCOUNTER — Encounter (HOSPITAL_COMMUNITY): Payer: Self-pay

## 2015-12-15 ENCOUNTER — Observation Stay (HOSPITAL_COMMUNITY)
Admission: EM | Admit: 2015-12-15 | Discharge: 2015-12-18 | Disposition: A | Discharge status: Home / Self Care | Payer: Medicare Other | Attending: Cardiology | Admitting: Cardiology

## 2015-12-15 DIAGNOSIS — Z79899 Other long term (current) drug therapy: Secondary | ICD-10-CM | POA: Diagnosis not present

## 2015-12-15 DIAGNOSIS — I129 Hypertensive chronic kidney disease with stage 1 through stage 4 chronic kidney disease, or unspecified chronic kidney disease: Secondary | ICD-10-CM | POA: Insufficient documentation

## 2015-12-15 DIAGNOSIS — I480 Paroxysmal atrial fibrillation: Secondary | ICD-10-CM | POA: Insufficient documentation

## 2015-12-15 DIAGNOSIS — E785 Hyperlipidemia, unspecified: Secondary | ICD-10-CM | POA: Diagnosis present

## 2015-12-15 DIAGNOSIS — D649 Anemia, unspecified: Secondary | ICD-10-CM | POA: Insufficient documentation

## 2015-12-15 DIAGNOSIS — N183 Chronic kidney disease, stage 3 unspecified: Secondary | ICD-10-CM | POA: Diagnosis present

## 2015-12-15 DIAGNOSIS — Z7901 Long term (current) use of anticoagulants: Secondary | ICD-10-CM

## 2015-12-15 DIAGNOSIS — I4891 Unspecified atrial fibrillation: Secondary | ICD-10-CM | POA: Diagnosis not present

## 2015-12-15 DIAGNOSIS — M1990 Unspecified osteoarthritis, unspecified site: Secondary | ICD-10-CM | POA: Diagnosis not present

## 2015-12-15 DIAGNOSIS — H409 Unspecified glaucoma: Secondary | ICD-10-CM | POA: Diagnosis not present

## 2015-12-15 DIAGNOSIS — N189 Chronic kidney disease, unspecified: Secondary | ICD-10-CM | POA: Diagnosis not present

## 2015-12-15 DIAGNOSIS — Z950 Presence of cardiac pacemaker: Secondary | ICD-10-CM | POA: Diagnosis present

## 2015-12-15 DIAGNOSIS — I429 Cardiomyopathy, unspecified: Secondary | ICD-10-CM

## 2015-12-15 DIAGNOSIS — F0390 Unspecified dementia without behavioral disturbance: Secondary | ICD-10-CM | POA: Diagnosis not present

## 2015-12-15 DIAGNOSIS — I517 Cardiomegaly: Secondary | ICD-10-CM | POA: Diagnosis not present

## 2015-12-15 DIAGNOSIS — I10 Essential (primary) hypertension: Secondary | ICD-10-CM | POA: Diagnosis present

## 2015-12-15 HISTORY — DX: Long term (current) use of anticoagulants: Z79.01

## 2015-12-15 LAB — BASIC METABOLIC PANEL
Anion gap: 13 (ref 5–15)
BUN: 27 mg/dL — ABNORMAL HIGH (ref 6–20)
CO2: 29 mmol/L (ref 22–32)
Calcium: 9.7 mg/dL (ref 8.9–10.3)
Chloride: 101 mmol/L (ref 101–111)
Creatinine, Ser: 1.29 mg/dL — ABNORMAL HIGH (ref 0.44–1.00)
GFR calc Af Amer: 41 mL/min — ABNORMAL LOW (ref >60)
GFR calc non Af Amer: 35 mL/min — ABNORMAL LOW (ref >60)
Glucose, Bld: 116 mg/dL — ABNORMAL HIGH (ref 65–99)
Potassium: 3.7 mmol/L (ref 3.5–5.1)
Sodium: 143 mmol/L (ref 135–145)

## 2015-12-15 LAB — PROTIME-INR
INR: 2.23 — ABNORMAL HIGH (ref 0.00–1.49)
Prothrombin Time: 24.5 seconds — ABNORMAL HIGH (ref 11.6–15.2)

## 2015-12-15 LAB — CBC
HCT: 38.1 % (ref 36.0–46.0)
Hemoglobin: 12.1 g/dL (ref 12.0–15.0)
MCH: 30.4 pg (ref 26.0–34.0)
MCHC: 31.8 g/dL (ref 30.0–36.0)
MCV: 95.7 fL (ref 78.0–100.0)
Platelets: 160 10*3/uL (ref 150–400)
RBC: 3.98 MIL/uL (ref 3.87–5.11)
RDW: 13.4 % (ref 11.5–15.5)
WBC: 10.6 10*3/uL — ABNORMAL HIGH (ref 4.0–10.5)

## 2015-12-15 LAB — I-STAT TROPONIN, ED: Troponin i, poc: 0.01 ng/mL (ref 0.00–0.08)

## 2015-12-15 LAB — MRSA PCR SCREENING: MRSA by PCR: NEGATIVE

## 2015-12-15 MED ORDER — DILTIAZEM HCL 100 MG IV SOLR
5.0000 mg/h | INTRAVENOUS | Status: DC
  Administered 2015-12-15: 5 mg/h via INTRAVENOUS
  Administered 2015-12-15: 7.5 mg/h via INTRAVENOUS
  Filled 2015-12-15: qty 100

## 2015-12-15 MED ORDER — WARFARIN SODIUM 2 MG PO TABS
2.0000 mg | ORAL_TABLET | Freq: Once | ORAL | Status: DC
  Filled 2015-12-15: qty 1

## 2015-12-15 MED ORDER — POTASSIUM CHLORIDE CRYS ER 20 MEQ PO TBCR
20.0000 meq | EXTENDED_RELEASE_TABLET | Freq: Every day | ORAL | Status: DC
  Administered 2015-12-16 – 2015-12-18 (×3): 20 meq via ORAL
  Filled 2015-12-15 (×3): qty 1

## 2015-12-15 MED ORDER — AMIODARONE LOAD VIA INFUSION
150.0000 mg | Freq: Once | INTRAVENOUS | Status: AC
  Administered 2015-12-15: 150 mg via INTRAVENOUS
  Filled 2015-12-15: qty 83.34

## 2015-12-15 MED ORDER — ONDANSETRON HCL 4 MG/2ML IJ SOLN: 4.0000 mg | Freq: Four times a day (QID) | INTRAMUSCULAR | Status: DC | PRN

## 2015-12-15 MED ORDER — LATANOPROST 0.005 % OP SOLN
1.0000 [drp] | Freq: Every day | OPHTHALMIC | Status: DC
  Administered 2015-12-15 – 2015-12-16 (×2): 1 [drp] via OPHTHALMIC
  Filled 2015-12-15: qty 2.5

## 2015-12-15 MED ORDER — AMIODARONE HCL IN DEXTROSE 360-4.14 MG/200ML-% IV SOLN
60.0000 mg/h | INTRAVENOUS | Status: AC
  Administered 2015-12-15 (×2): 60 mg/h via INTRAVENOUS
  Filled 2015-12-15 (×2): qty 200

## 2015-12-15 MED ORDER — SODIUM CHLORIDE 0.9 % IV SOLN
1.0000 g | Freq: Once | INTRAVENOUS | Status: AC
  Administered 2015-12-15: 1 g via INTRAVENOUS
  Filled 2015-12-15: qty 10

## 2015-12-15 MED ORDER — ATORVASTATIN CALCIUM 10 MG PO TABS
10.0000 mg | ORAL_TABLET | Freq: Every day | ORAL | Status: DC
  Administered 2015-12-15 – 2015-12-17 (×2): 10 mg via ORAL
  Filled 2015-12-15 (×2): qty 1

## 2015-12-15 MED ORDER — SODIUM CHLORIDE 0.9% FLUSH: 3.0000 mL | INTRAVENOUS | Status: DC | PRN

## 2015-12-15 MED ORDER — AMIODARONE HCL IN DEXTROSE 360-4.14 MG/200ML-% IV SOLN
30.0000 mg/h | INTRAVENOUS | Status: DC
  Administered 2015-12-15 (×2): 30 mg/h via INTRAVENOUS
  Filled 2015-12-15: qty 200

## 2015-12-15 MED ORDER — SODIUM CHLORIDE 0.9 % IV SOLN: 250.0000 mL | INTRAVENOUS | Status: DC | PRN

## 2015-12-15 MED ORDER — FUROSEMIDE 80 MG PO TABS
80.0000 mg | ORAL_TABLET | ORAL | Status: DC
  Administered 2015-12-16 – 2015-12-18 (×2): 80 mg via ORAL
  Filled 2015-12-15 (×2): qty 1

## 2015-12-15 MED ORDER — OLOPATADINE HCL 0.1 % OP SOLN
1.0000 [drp] | Freq: Two times a day (BID) | OPHTHALMIC | Status: DC
  Administered 2015-12-16 – 2015-12-18 (×5): 1 [drp] via OPHTHALMIC
  Filled 2015-12-15: qty 5

## 2015-12-15 MED ORDER — METOPROLOL TARTRATE 50 MG PO TABS
75.0000 mg | ORAL_TABLET | Freq: Two times a day (BID) | ORAL | Status: DC
  Administered 2015-12-15 – 2015-12-18 (×6): 75 mg via ORAL
  Filled 2015-12-15 (×6): qty 1

## 2015-12-15 MED ORDER — WARFARIN SODIUM 3 MG PO TABS
3.0000 mg | ORAL_TABLET | Freq: Once | ORAL | Status: AC
  Administered 2015-12-15: 3 mg via ORAL
  Filled 2015-12-15: qty 1

## 2015-12-15 MED ORDER — DEXTROSE 5 % IV SOLN: 5.0000 mg/h | INTRAVENOUS | Status: DC

## 2015-12-15 MED ORDER — WARFARIN - PHARMACIST DOSING INPATIENT
Freq: Every day | Status: DC
  Administered 2015-12-17: 17:00:00

## 2015-12-15 MED ORDER — NITROGLYCERIN 0.4 MG SL SUBL: 0.4000 mg | SUBLINGUAL_TABLET | SUBLINGUAL | Status: DC | PRN

## 2015-12-15 MED ORDER — ACETAMINOPHEN 325 MG PO TABS: 650.0000 mg | ORAL_TABLET | ORAL | Status: DC | PRN

## 2015-12-15 MED ORDER — SODIUM CHLORIDE 0.9 % IV BOLUS (SEPSIS)
500.0000 mL | Freq: Once | INTRAVENOUS | Status: AC
  Administered 2015-12-15: 500 mL via INTRAVENOUS

## 2015-12-15 MED ORDER — SODIUM CHLORIDE 0.9% FLUSH
3.0000 mL | Freq: Two times a day (BID) | INTRAVENOUS | Status: DC
  Administered 2015-12-16 – 2015-12-17 (×2): 3 mL via INTRAVENOUS

## 2015-12-15 MED ORDER — LISINOPRIL 5 MG PO TABS
5.0000 mg | ORAL_TABLET | Freq: Every day | ORAL | Status: DC
  Administered 2015-12-15 – 2015-12-18 (×4): 5 mg via ORAL
  Filled 2015-12-15 (×4): qty 1

## 2015-12-15 MED ORDER — DILTIAZEM LOAD VIA INFUSION
15.0000 mg | Freq: Once | INTRAVENOUS | Status: AC
  Administered 2015-12-15: 15 mg via INTRAVENOUS
  Filled 2015-12-15: qty 15

## 2015-12-15 MED ORDER — ADULT MULTIVITAMIN W/MINERALS CH
1.0000 | ORAL_TABLET | Freq: Every day | ORAL | Status: DC
  Administered 2015-12-16 – 2015-12-18 (×3): 1 via ORAL
  Filled 2015-12-15 (×4): qty 1

## 2015-12-15 NOTE — Progress Notes (Addendum)
ANTICOAGULATION CONSULT NOTE - Initial Consult  Pharmacy Consult for warfarin Indication: atrial fibrillation  No Known Allergies  Patient Measurements: Height: 5\' 5"  (165.1 cm) Weight: 139 lb (63.05 kg) IBW/kg (Calculated) : 57  Vital Signs: Temp: 98.4 F (36.9 C) (04/02 0654) Temp Source: Oral (04/02 0654) BP: 112/82 mmHg (04/02 1515) Pulse Rate: 42 (04/02 1515)  Labs:  Recent Labs  12/15/15 0722  HGB 12.1  HCT 38.1  PLT 160  LABPROT 24.5*  INR 2.23*  CREATININE 1.29*    Estimated Creatinine Clearance: 26.1 mL/min (by C-G formula based on Cr of 1.29).   Medical History: Past Medical History  Diagnosis Date  . Malleolar fracture   . Hypertension   . Dementia     mild  . Dyslipidemia   . Glaucoma   . Anemia   . Cardiomyopathy     presumed ischemic in the past with an EF of 35%. The most recent EF in 03-2008, however, was 55 -60%  . Chronic renal insufficiency   . Bradycardia   . Paroxysmal atrial fibrillation (HCC)   . Arthritis     "knees especially"    Assessment:  82 y/o F on 04/02 with weakness. Pharmacy consulted to start warfarin from pta for afib. Admission INR 2.23, hgb 12.1, plts 160  PTA warfarin: 3 mg daily except 4mg  on Wed  Goal of Therapy:  INR 2-3 Monitor platelets   Plan:  Warfarin 3 mg x 1 po tonight Daily INR/CBC Monitor for S&S of bleed  Carl Best P 12/15/2015,3:36 PM

## 2015-12-15 NOTE — ED Notes (Signed)
Notified cardiology PA for need to order home medications.

## 2015-12-15 NOTE — ED Provider Notes (Signed)
CSN: [id number]     Arrival date & time 12/15/15  [id number] History   First MD Initiated Contact with Patient 12/15/15 0701     Chief Complaint  Patient presents with  . Atrial Fibrillation      HPI Per EMS pt called due to having generalized weakness since 12/14/15 at 8am; Pt denies pain and states complaint with meds; pt has hx of Afib; pt was in Afib prior to arrival at 160-180 HR; pt given 10 mg Cardizem in route but BP dropped from 127/90 to 59/39; Pt from home alone and uses cane and walker to ambulate; Past Medical History  Diagnosis Date  . Malleolar fracture   . Hypertension   . Dementia     mild  . Dyslipidemia   . Glaucoma   . Anemia   . Cardiomyopathy     presumed ischemic in the past with an EF of 35%. The most recent EF in 03-2008, however, was 55 -60%  . Chronic renal insufficiency   . Bradycardia   . Paroxysmal atrial fibrillation (HCC)   . Arthritis     "knees especially"   Past Surgical History  Procedure Laterality Date  . Hemorrhoid surgery    . Insert / replace / remove pacemaker  09/17/11    initial placement  . Insert / replace / remove pacemaker  09/18/11  . Abdominal hysterectomy    . Tonsillectomy and adenoidectomy    . Fracture surgery  ~ 2007    RLE  . Cardioversion  07/2011  . Eye surgery  2002    "blood clot behind eye"  . Permanent pacemaker insertion N/A 09/17/2011    Procedure: PERMANENT PACEMAKER INSERTION;  Surgeon: Deboraha Sprang, MD;  Location: [facility] CATH LAB;  Service: Cardiovascular;  Laterality: N/A;  . Pacemaker revision N/A 09/18/2011    Procedure: PACEMAKER REVISION;  Surgeon: Thompson Grayer, MD;  Location: [facility] CATH LAB;  Service: Cardiovascular;  Laterality: N/A;   Family History  Problem Relation Age of Onset  . Other      Noncontributory   Social History  Substance Use Topics  . Smoking status: Never Smoker   . Smokeless tobacco: Never Used  . Alcohol Use: No   OB History    No data available     Review of Systems  All other  systems reviewed and are negative.     Allergies  Review of patient's allergies indicates no known allergies.  Home Medications   Prior to Admission medications   Medication Sig Start Date End Date Taking? Authorizing Provider  furosemide (LASIX) 80 MG tablet TAKE ONE TABLET BY MOUTH EVERY DAY 08/19/15  Yes Minus Breeding, MD  lisinopril (PRINIVIL,ZESTRIL) 5 MG tablet TAKE 1 TABLET(5 MG) BY MOUTH DAILY 08/19/15  Yes Minus Breeding, MD  metoprolol (LOPRESSOR) 50 MG tablet Take 1.5 tablets (75 mg total) by mouth 2 (two) times daily. 08/05/15  Yes Amber Sena Slate, NP  Multiple Vitamin (MULTIVITAMIN) tablet Take 1 tablet by mouth daily.     Yes Historical Provider, MD  Olopatadine HCl (PATADAY) 0.2 % SOLN Place 1 drop into both eyes daily.    Yes Historical Provider, MD  potassium chloride SA (K-DUR,KLOR-CON) 20 MEQ tablet TAKE ONE TABLET BY MOUTH ONE TIME DAILY 12/31/14  Yes Minus Breeding, MD  TRAVATAN Z 0.004 % SOLN ophthalmic solution Place 1 drop into both eyes at bedtime. 07/09/15  Yes Historical Provider, MD  warfarin (COUMADIN) 2 MG tablet TAKE 1 AND 1/2 TO 2 TABLETS BY  MOUTH DAILY AS DIRECTED BY COUMADIN CLINIC Patient taking differently: Take 1.5 tablets everyday except Wednesday take 2 tablets. 11/05/15  Yes Minus Breeding, MD  amiodarone (PACERONE) 400 MG tablet Take 1 tablet (400 mg total) by mouth daily. 12/18/15   Brittainy Erie Noe, PA-C  atorvastatin (LIPITOR) 10 MG tablet TAKE 1 TABLET BY MOUTH AT BEDTIME 12/19/15   Minus Breeding, MD   BP 123/81 mmHg  Pulse 63  Temp(Src) 98.3 F (36.8 C) (Oral)  Resp 18  Ht 5\' 5"  (1.651 m)  Wt 139 lb (63.05 kg)  BMI 23.13 kg/m2  SpO2 93% Physical Exam  Constitutional: She appears well-developed and well-nourished. No distress.  HENT:  Head: Normocephalic and atraumatic.  Eyes: Pupils are equal, round, and reactive to light.  Neck: Normal range of motion.  Cardiovascular: Intact distal pulses.  An irregularly irregular rhythm present.  Tachycardia present.   Pulmonary/Chest: No respiratory distress.  Abdominal: Soft. Normal appearance and bowel sounds are normal. She exhibits no distension. There is no tenderness.  Musculoskeletal: Normal range of motion.  Neurological: She is alert. No cranial nerve deficit. Coordination normal.  Skin: Skin is warm and dry. No rash noted.  Psychiatric: Her behavior is normal.  Nursing note and vitals reviewed.   ED Course  Procedures (including critical care time)  CRITICAL CARE Performed by: Leonard Schwartz L Total critical care time: 30 minutes Critical care time was exclusive of separately billable procedures and treating other patients. Critical care was necessary to treat or prevent imminent or life-threatening deterioration. Critical care was time spent personally by me on the following activities: development of treatment plan with patient and/or surrogate as well as nursing, discussions with consultants, evaluation of patient's response to treatment, examination of patient, obtaining history from patient or surrogate, ordering and performing treatments and interventions, ordering and review of laboratory studies, ordering and review of radiographic studies, pulse oximetry and re-evaluation of patient's condition.  Medications  amiodarone (NEXTERONE) 1.8 mg/mL load via infusion 150 mg (150 mg Intravenous Bolus from Bag 12/15/15 1049)    Followed by  amiodarone (NEXTERONE PREMIX) 360 MG/200ML (1.8 mg/mL) IV infusion (0 mg/hr Intravenous Stopped 12/15/15 1708)  sodium chloride 0.9 % bolus 500 mL (0 mLs Intravenous Stopped 12/15/15 0846)  calcium gluconate 1 g in sodium chloride 0.9 % 100 mL IVPB (0 g Intravenous Stopped 12/15/15 0802)  diltiazem (CARDIZEM) 1 mg/mL load via infusion 15 mg (15 mg Intravenous Given 12/15/15 0804)  warfarin (COUMADIN) tablet 3 mg (3 mg Oral Given 12/15/15 1710)  off the beat book ( Does not apply Given 12/16/15 1009)  potassium chloride SA (K-DUR,KLOR-CON) CR tablet  40 mEq (40 mEq Oral Given 12/17/15 1020)  warfarin (COUMADIN) tablet 3 mg (3 mg Oral Given 12/17/15 1715)    Labs Review Labs Reviewed  BASIC METABOLIC PANEL - Abnormal; Notable for the following:    Glucose, Bld 116 (*)    BUN 27 (*)    Creatinine, Ser 1.29 (*)    GFR calc non Af Amer 35 (*)    GFR calc Af Amer 41 (*)    All other components within normal limits  CBC - Abnormal; Notable for the following:    WBC 10.6 (*)    All other components within normal limits  PROTIME-INR - Abnormal; Notable for the following:    Prothrombin Time 24.5 (*)    INR 2.23 (*)    All other components within normal limits  COMPREHENSIVE METABOLIC PANEL - Abnormal; Notable for the following:  Potassium 3.2 (*)    Glucose, Bld 114 (*)    BUN 23 (*)    Creatinine, Ser 1.29 (*)    Total Protein 6.0 (*)    Albumin 3.4 (*)    GFR calc non Af Amer 35 (*)    GFR calc Af Amer 41 (*)    All other components within normal limits  PROTIME-INR - Abnormal; Notable for the following:    Prothrombin Time 25.9 (*)    INR 2.40 (*)    All other components within normal limits  PROTIME-INR - Abnormal; Notable for the following:    Prothrombin Time 24.8 (*)    INR 2.27 (*)    All other components within normal limits  PROTIME-INR - Abnormal; Notable for the following:    Prothrombin Time 23.4 (*)    INR 2.10 (*)    All other components within normal limits  CBC - Abnormal; Notable for the following:    WBC 13.0 (*)    RBC 3.81 (*)    All other components within normal limits  MRSA PCR SCREENING  MAGNESIUM  TSH  I-STAT TROPOININ, ED    Imaging Review No results found. I have personally reviewed and evaluated these images and lab results as part of my medical decision-making.   EKG Interpretation   Date/Time:  Sunday December 15 2015 06:49:18 EDT Ventricular Rate:  146 PR Interval:    QRS Duration: 130 QT Interval:  323 QTC Calculation: 503 R Axis:   -39 Text Interpretation:  Atrial fibrillation  with rapid ventricular response  Ventricular premature complex Left bundle branch block Abnormal ekg  Confirmed by Kathrynn Backstrom  MD, Zya Finkle ([id number]) on 12/15/2015 7:23:33 AM     I discussed the case with cardiology who will see the patient in the emergency room. MDM   Final diagnoses:  Atrial fibrillation with RVR (HCC)       Leonard Schwartz, MD 12/19/15 2312

## 2015-12-15 NOTE — ED Notes (Signed)
Attempted report 

## 2015-12-15 NOTE — ED Notes (Signed)
Dr Beaton at bedside 

## 2015-12-15 NOTE — ED Notes (Signed)
Per EMS pt called due to having generalized weakness since 12/14/15 at 8am; Pt denies pain and states complaint with meds; pt has hx of Afib; pt was in Afib prior to arrival at 160-180 HR; pt given 10 mg Cardizem in route but BP dropped from 127/90 to 59/39; Pt from home alone and uses cane and walker to ambulate; Pt a&ox 4 on arrival.

## 2015-12-15 NOTE — H&P (Signed)
Patient ID: [name] MRN: [id number], DOB/AGE: 03/05/1923   Admit date: 12/15/2015   Primary Physician: Chriss Czar, MD Primary Cardiologist: Dr Percival Spanish  HPI: 82 y/o AA female with a history of AF and SSS. She had a MDT Jan 2013. She lives alone and is on Coumadin. She gets around with a walker, "I don't really need it", no history a falls. Her rate tends to run high but she has been asymptomatic- see EP note from Nov 2016. She called EMS this am because of being weak. She could tell her HR was fast. EMS gave her IV Diltiazem 10 mg for HR 160-180 and her B/P dropped. She is comfortable in the ED now- HR 110 on IV Diltiazem.    Problem List: Past Medical History  Diagnosis Date  . Malleolar fracture   . Hypertension   . Dementia     mild  . Dyslipidemia   . Glaucoma   . Anemia   . Cardiomyopathy     presumed ischemic in the past with an EF of 35%. The most recent EF in 03-2008, however, was 55 -60%  . Chronic renal insufficiency   . Bradycardia   . Paroxysmal atrial fibrillation (HCC)   . Arthritis     "knees especially"    Past Surgical History  Procedure Laterality Date  . Hemorrhoid surgery    . Insert / replace / remove pacemaker  09/17/11    initial placement  . Insert / replace / remove pacemaker  09/18/11  . Abdominal hysterectomy    . Tonsillectomy and adenoidectomy    . Fracture surgery  ~ 2007    RLE  . Cardioversion  07/2011  . Eye surgery  2002    "blood clot behind eye"  . Permanent pacemaker insertion N/A 09/17/2011    Procedure: PERMANENT PACEMAKER INSERTION;  Surgeon: Deboraha Sprang, MD;  Location: [facility] CATH LAB;  Service: Cardiovascular;  Laterality: N/A;  . Pacemaker revision N/A 09/18/2011    Procedure: PACEMAKER REVISION;  Surgeon: Thompson Grayer, MD;  Location: [facility] CATH LAB;  Service: Cardiovascular;  Laterality: N/A;     Allergies: No Known Allergies   Home Medications Prior to Admission medications   Medication Sig Start Date End Date  Taking? Authorizing Provider  atorvastatin (LIPITOR) 10 MG tablet TAKE 1 TABLET BY MOUTH EVERY DAY. PLEASE CALL AND SCHEDULE A DECEMBER APPT. 09/02/15  Yes Minus Breeding, MD  furosemide (LASIX) 80 MG tablet TAKE ONE TABLET BY MOUTH EVERY DAY 08/19/15  Yes Minus Breeding, MD  lisinopril (PRINIVIL,ZESTRIL) 5 MG tablet TAKE 1 TABLET(5 MG) BY MOUTH DAILY 08/19/15  Yes Minus Breeding, MD  metoprolol (LOPRESSOR) 50 MG tablet Take 1.5 tablets (75 mg total) by mouth 2 (two) times daily. 08/05/15  Yes Amber Sena Slate, NP  Multiple Vitamin (MULTIVITAMIN) tablet Take 1 tablet by mouth daily.     Yes Historical Provider, MD  Olopatadine HCl (PATADAY) 0.2 % SOLN Place 1 drop into both eyes daily.    Yes Historical Provider, MD  potassium chloride SA (K-DUR,KLOR-CON) 20 MEQ tablet TAKE ONE TABLET BY MOUTH ONE TIME DAILY 12/31/14  Yes Minus Breeding, MD  TRAVATAN Z 0.004 % SOLN ophthalmic solution Place 1 drop into both eyes at bedtime. 07/09/15  Yes Historical Provider, MD  warfarin (COUMADIN) 2 MG tablet TAKE 1 AND 1/2 TO 2 TABLETS BY MOUTH DAILY AS DIRECTED BY COUMADIN CLINIC Patient taking differently: Take 1.5 tablets everyday except Wednesday take 2 tablets. 11/05/15  Yes Minus Breeding,  MD     Family History  Problem Relation Age of Onset  . Other      Noncontributory     Social History   Social History  . Marital Status: Widowed    Spouse Name: N/A  . Number of Children: N/A  . Years of Education: N/A   Occupational History  .     Social History Main Topics  . Smoking status: Never Smoker   . Smokeless tobacco: Never Used  . Alcohol Use: No  . Drug Use: No  . Sexual Activity: No   Other Topics Concern  . Not on file   Social History Narrative     Review of Systems: General: negative for chills, fever, night sweats or weight changes.  Cardiovascular: negative for chest pain, dyspnea on exertion, edema, orthopnea, palpitations, paroxysmal nocturnal dyspnea or shortness of  breath HEENT: negative for any visual disturbances, blindness, glaucoma Dermatological: negative for rash Respiratory: negative for cough, hemoptysis, or wheezing Urologic: negative for hematuria or dysuria Abdominal: negative for nausea, vomiting, diarrhea, bright red blood per rectum, melena, or hematemesis Neurologic: negative for visual changes, syncope, or dizziness Musculoskeletal: negative for back pain, joint pain, or swelling Psych: cooperative and appropriate All other systems reviewed and are otherwise negative except as noted above.  Physical Exam: Blood pressure 113/85, pulse 48, temperature 98.4 F (36.9 C), temperature source Oral, resp. rate 23, height 5\' 5"  (1.651 m), weight 139 lb (63.05 kg), SpO2 99 %.  General appearance: alert, cooperative and no distress Neck: no carotid bruit and no JVD Lungs: decreased Rt base Heart: irregularly irregular rhythm Abdomen: soft, non-tender; bowel sounds normal; no masses,  no organomegaly Extremities: extremities normal, atraumatic, no cyanosis or edema Pulses: 2+ and symmetric Skin: Skin color, texture, turgor normal. No rashes or lesions Neurologic: Grossly normal    Labs:   Results for orders placed or performed during the hospital encounter of 12/15/15 (from the past 24 hour(s))  I-stat troponin, ED     Status: None   Collection Time: 12/15/15  7:15 AM  Result Value Ref Range   Troponin i, poc 0.01 0.00 - 0.08 ng/mL   Comment 3          Basic metabolic panel     Status: Abnormal   Collection Time: 12/15/15  7:22 AM  Result Value Ref Range   Sodium 143 135 - 145 mmol/L   Potassium 3.7 3.5 - 5.1 mmol/L   Chloride 101 101 - 111 mmol/L   CO2 29 22 - 32 mmol/L   Glucose, Bld 116 (H) 65 - 99 mg/dL   BUN 27 (H) 6 - 20 mg/dL   Creatinine, Ser 1.29 (H) 0.44 - 1.00 mg/dL   Calcium 9.7 8.9 - 10.3 mg/dL   GFR calc non Af Amer 35 (L) >60 mL/min   GFR calc Af Amer 41 (L) >60 mL/min   Anion gap 13 5 - 15  CBC     Status:  Abnormal   Collection Time: 12/15/15  7:22 AM  Result Value Ref Range   WBC 10.6 (H) 4.0 - 10.5 K/uL   RBC 3.98 3.87 - 5.11 MIL/uL   Hemoglobin 12.1 12.0 - 15.0 g/dL   HCT 38.1 36.0 - 46.0 %   MCV 95.7 78.0 - 100.0 fL   MCH 30.4 26.0 - 34.0 pg   MCHC 31.8 30.0 - 36.0 g/dL   RDW 13.4 11.5 - 15.5 %   Platelets 160 150 - 400 K/uL  Protime-INR - (order  if Patient is taking Coumadin / Warfarin)     Status: Abnormal   Collection Time: 12/15/15  7:22 AM  Result Value Ref Range   Prothrombin Time 24.5 (H) 11.6 - 15.2 seconds   INR 2.23 (H) 0.00 - 1.49     Radiology/Studies: Dg Chest Portable 1 View  12/15/2015  CLINICAL DATA:  Atrial fibrillation. EXAM: PORTABLE CHEST 1 VIEW COMPARISON:  September 19, 2011. FINDINGS: Stable cardiomegaly. No pneumothorax is noted. Mild bibasilar opacities are noted most consistent with subsegmental atelectasis. Left-sided pacemaker is unchanged in position. Bony thorax is unremarkable. IMPRESSION: Mild bibasilar subsegmental atelectasis.  Stable cardiomegaly. Electronically Signed   By: Marijo Conception, M.D.   On: 12/15/2015 07:42    EKG:AF with RVR at 146, IVCD  ASSESSMENT AND PLAN:  Principal Problem:   Atrial fibrillation with RVR ([place]) Active Problems:   Essential hypertension   Chronic renal insufficiency, stage III (moderate)   Chronic anticoagulation   Dyslipidemia   Cardiomyopathy EF 20% by echo [facility])   Pacemaker- [employer]onic   PLAN: Admit to adjust medications in the hospital.    SignedErlene Quan, PA-C 12/15/2015, 9:51 AM [phone]  Patient seen and examined with Kerin Ransom, PA-C. We discussed all aspects of the encounter. I agree with the assessment and plan as stated above.   82 y/o woman with chronic AF and LV dysfunction (last echo 2012 at [facility] EF 20%). Struggled with tachy-brady syndrome in past. Now s/p PPM. Amiodarone d/c'd about 6 months go as AF felt to be chronic. Af rate has always been somewhat fast and b-blocker added.  Presented with weakness found to be in AF with RVR in 140s. Started on diltiazem. Initially became hypotensive but now tolerating. Rate 110-120 on dilt 7.5/hr. No evidence HF.  Given low EF will switch back to [facility] for rate control and see how she does. If that fails, could consider AVN ablation but would need RT upgrade so would like to avoid, if possible. Check echo.   Astra Gregg,MD 10:20 AM

## 2015-12-16 DIAGNOSIS — Z950 Presence of cardiac pacemaker: Secondary | ICD-10-CM | POA: Diagnosis not present

## 2015-12-16 DIAGNOSIS — Z7901 Long term (current) use of anticoagulants: Secondary | ICD-10-CM | POA: Diagnosis not present

## 2015-12-16 DIAGNOSIS — I4891 Unspecified atrial fibrillation: Secondary | ICD-10-CM | POA: Diagnosis not present

## 2015-12-16 LAB — TSH: TSH: 3.865 u[IU]/mL (ref 0.350–4.500)

## 2015-12-16 LAB — COMPREHENSIVE METABOLIC PANEL
ALT: 34 U/L (ref 14–54)
AST: 21 U/L (ref 15–41)
Albumin: 3.4 g/dL — ABNORMAL LOW (ref 3.5–5.0)
Alkaline Phosphatase: 48 U/L (ref 38–126)
Anion gap: 8 (ref 5–15)
BUN: 23 mg/dL — ABNORMAL HIGH (ref 6–20)
CO2: 31 mmol/L (ref 22–32)
Calcium: 9.5 mg/dL (ref 8.9–10.3)
Chloride: 106 mmol/L (ref 101–111)
Creatinine, Ser: 1.29 mg/dL — ABNORMAL HIGH (ref 0.44–1.00)
GFR calc Af Amer: 41 mL/min — ABNORMAL LOW (ref >60)
GFR calc non Af Amer: 35 mL/min — ABNORMAL LOW (ref >60)
Glucose, Bld: 114 mg/dL — ABNORMAL HIGH (ref 65–99)
Potassium: 3.2 mmol/L — ABNORMAL LOW (ref 3.5–5.1)
Sodium: 145 mmol/L (ref 135–145)
Total Bilirubin: 1 mg/dL (ref 0.3–1.2)
Total Protein: 6 g/dL — ABNORMAL LOW (ref 6.5–8.1)

## 2015-12-16 LAB — PROTIME-INR
INR: 2.4 — ABNORMAL HIGH (ref 0.00–1.49)
Prothrombin Time: 25.9 seconds — ABNORMAL HIGH (ref 11.6–15.2)

## 2015-12-16 LAB — MAGNESIUM: Magnesium: 2.1 mg/dL (ref 1.7–2.4)

## 2015-12-16 MED ORDER — AMIODARONE HCL 200 MG PO TABS
400.0000 mg | ORAL_TABLET | Freq: Two times a day (BID) | ORAL | Status: DC
  Administered 2015-12-16 – 2015-12-18 (×5): 400 mg via ORAL
  Filled 2015-12-16 (×5): qty 2

## 2015-12-16 MED ORDER — OFF THE BEAT BOOK
Freq: Once | Status: AC
Start: 2015-12-16 — End: 2015-12-16
  Administered 2015-12-16: 10:00:00
  Filled 2015-12-16: qty 1

## 2015-12-16 NOTE — Progress Notes (Signed)
SUBJECTIVE:  Feels much better after converting to NSR  OBJECTIVE:   Vitals:   Filed Vitals:   12/15/15 1938 12/15/15 2125 12/15/15 2134 12/16/15 0412  BP: 107/84 100/68  124/96  Pulse: 121 77  75  Temp:  98.3 F (36.8 C)  98.2 F (36.8 C)  TempSrc:      Resp:  21 28 28   Height:      Weight:    139 lb 12.8 oz (63.413 kg)  SpO2:  99%  97%   I&O's:   Intake/Output Summary (Last 24 hours) at 12/16/15 0848 Last data filed at 12/16/15 0700  Gross per 24 hour  Intake  858.2 ml  Output    300 ml  Net  558.2 ml   TELEMETRY: Reviewed telemetry pt in NSR, PACs, PVCs:     PHYSICAL EXAM General: Well developed, well nourished, in no acute distress Head:   Normal cephalic and atramatic  Lungs:   Clear bilaterally to auscultation. Heart:   HRRR S1 S2  No JVD.   Abdomen: abdomen soft and non-tender Msk:  Back normal,  Normal strength and tone for age. Extremities:   Tr ankle edema.   Neuro: Alert and oriented. Psych:  Normal affect, responds appropriately Skin: No rash   LABS: Basic Metabolic Panel:  Recent Labs  12/15/15 0722 12/16/15 0506  NA 143 145  K 3.7 3.2*  CL 101 106  CO2 29 31  GLUCOSE 116* 114*  BUN 27* 23*  CREATININE 1.29* 1.29*  CALCIUM 9.7 9.5  MG  --  2.1   Liver Function Tests:  Recent Labs  12/16/15 0506  AST 21  ALT 34  ALKPHOS 48  BILITOT 1.0  PROT 6.0*  ALBUMIN 3.4*   No results for input(s): LIPASE, AMYLASE in the last 72 hours. CBC:  Recent Labs  12/15/15 0722  WBC 10.6*  HGB 12.1  HCT 38.1  MCV 95.7  PLT 160   Cardiac Enzymes: No results for input(s): CKTOTAL, CKMB, CKMBINDEX, TROPONINI in the last 72 hours. BNP: Invalid input(s): POCBNP D-Dimer: No results for input(s): DDIMER in the last 72 hours. Hemoglobin A1C: No results for input(s): HGBA1C in the last 72 hours. Fasting Lipid Panel: No results for input(s): CHOL, HDL, LDLCALC, TRIG, CHOLHDL, LDLDIRECT in the last 72 hours. Thyroid Function  Tests:  Recent Labs  12/16/15 0506  TSH 3.865   Anemia Panel: No results for input(s): VITAMINB12, FOLATE, FERRITIN, TIBC, IRON, RETICCTPCT in the last 72 hours. Coag Panel:   Lab Results  Component Value Date   INR 2.40* 12/16/2015   INR 2.23* 12/15/2015   INR 2.1 11/28/2015   PROTIME 25.5 02/21/2009   PROTIME 21.1 02/07/2009    RADIOLOGY: Dg Chest Portable 1 View  12/15/2015  CLINICAL DATA:  Atrial fibrillation. EXAM: PORTABLE CHEST 1 VIEW COMPARISON:  September 19, 2011. FINDINGS: Stable cardiomegaly. No pneumothorax is noted. Mild bibasilar opacities are noted most consistent with subsegmental atelectasis. Left-sided pacemaker is unchanged in position. Bony thorax is unremarkable. IMPRESSION: Mild bibasilar subsegmental atelectasis.  Stable cardiomegaly. Electronically Signed   By: Marijo Conception, M.D.   On: 12/15/2015 07:42      ASSESSMENT: AFib, pacer  PLAN:  Converted to NSR on Amio.  Change Amio to oral 400 mg BID.  Amio was stopped in 9/16, but with the recurrence, she was symptomatic with the arrhythmia.  PT consult to look at her mobility.  COumadin for stroke prevention as long as she is not very unsteady.  Consider discharge tomorrow if she remains stable on oral Amiodarone.  Would have to decrease to 400 mg daily at the time of discharge.  Jettie Booze, MD  12/16/2015  8:48 AM

## 2015-12-16 NOTE — Progress Notes (Signed)
ANTICOAGULATION CONSULT NOTE  Pharmacy Consult for warfarin Indication: atrial fibrillation  No Known Allergies  Patient Measurements: Height: 5\' 5"  (165.1 cm) Weight: 139 lb 12.8 oz (63.413 kg) IBW/kg (Calculated) : 57  Vital Signs: Temp: 98.2 F (36.8 C) (04/03 0412) BP: 124/96 mmHg (04/03 0412) Pulse Rate: 75 (04/03 0412)  Labs:  Recent Labs  12/15/15 0722 12/16/15 0506  HGB 12.1  --   HCT 38.1  --   PLT 160  --   LABPROT 24.5* 25.9*  INR 2.23* 2.40*  CREATININE 1.29* 1.29*    Estimated Creatinine Clearance: 26.1 mL/min (by C-G formula based on Cr of 1.29).   Medical History: Past Medical History  Diagnosis Date  . Malleolar fracture   . Hypertension   . Dementia     mild  . Dyslipidemia   . Glaucoma   . Anemia   . Cardiomyopathy     presumed ischemic in the past with an EF of 35%. The most recent EF in 03-2008, however, was 55 -60%  . Chronic renal insufficiency   . Bradycardia   . Paroxysmal atrial fibrillation (HCC)   . Arthritis     "knees especially"    Assessment:  82 y/o F on 04/02 with weakness. Pharmacy consulted to start warfarin from pta for afib. Admission INR 2.23, hgb/plts wnl. Started on amiodarone 4/2.  --Will hold off on restarting home dose and monitor for effects of amiodarone on INR  PTA warfarin: 3 mg daily except 4mg  on Wed  Goal of Therapy:  INR 2-3 Monitor platelets   Plan:  Warfarin 3 mg x 1 po tonight Daily INR/CBC Monitor for S&S of bleed   Thank you for allowing [place] to participate in this patients care. Jens Som, PharmD Pager: [phone] 12/16/2015,11:17 AM

## 2015-12-16 NOTE — Discharge Instructions (Addendum)
Information on my medicine - Coumadin   (Warfarin)  This medication education was reviewed with me or my healthcare representative as part of my discharge preparation.   Why was Coumadin prescribed for you? Coumadin was prescribed for you because you have a blood clot or a medical condition that can cause an increased risk of forming blood clots. Blood clots can cause serious health problems by blocking the flow of blood to the heart, lung, or brain. Coumadin can prevent harmful blood clots from forming. As a reminder your indication for Coumadin is:   Stroke Prevention Because Of Atrial Fibrillation  What test will check on my response to Coumadin? While on Coumadin (warfarin) you will need to have an INR test regularly to ensure that your dose is keeping you in the desired range. The INR (international normalized ratio) number is calculated from the result of the laboratory test called prothrombin time (PT).  If an INR APPOINTMENT HAS NOT ALREADY BEEN MADE FOR YOU please schedule an appointment to have this lab work done by your health care provider within 7 days. Your INR goal is usually a number between:  2 to 3 or your provider may give you a more narrow range like 2-2.5.  Ask your health care provider during an office visit what your goal INR is.  What  do you need to  know  About  COUMADIN? Take Coumadin (warfarin) exactly as prescribed by your healthcare provider about the same time each day.  DO NOT stop taking without talking to the doctor who prescribed the medication.  Stopping without other blood clot prevention medication to take the place of Coumadin may increase your risk of developing a new clot or stroke.  Get refills before you run out.  What do you do if you miss a dose? If you miss a dose, take it as soon as you remember on the same day then continue your regularly scheduled regimen the next day.  Do not take two doses of Coumadin at the same time.  Important Safety  Information A possible side effect of Coumadin (Warfarin) is an increased risk of bleeding. You should call your healthcare provider right away if you experience any of the following: ? Bleeding from an injury or your nose that does not stop. ? Unusual colored urine (red or dark brown) or unusual colored stools (red or black). ? Unusual bruising for unknown reasons. ? A serious fall or if you hit your head (even if there is no bleeding).  Some foods or medicines interact with Coumadin (warfarin) and might alter your response to warfarin. To help avoid this: ? Eat a balanced diet, maintaining a consistent amount of Vitamin K. ? Notify your provider about major diet changes you plan to make. ? Avoid alcohol or limit your intake to 1 drink for women and 2 drinks for men per day. (1 drink is 5 oz. wine, 12 oz. beer, or 1.5 oz. liquor.)  Make sure that ANY health care provider who prescribes medication for you knows that you are taking Coumadin (warfarin).  Also make sure the healthcare provider who is monitoring your Coumadin knows when you have started a new medication including herbals and non-prescription products.  Coumadin (Warfarin)  Major Drug Interactions  Increased Warfarin Effect Decreased Warfarin Effect  Alcohol (large quantities) Antibiotics (esp. Septra/Bactrim, Flagyl, Cipro) Amiodarone (Cordarone) Aspirin (ASA) Cimetidine (Tagamet) Megestrol (Megace) NSAIDs (ibuprofen, naproxen, etc.) Piroxicam (Feldene) Propafenone (Rythmol SR) Propranolol (Inderal) Isoniazid (INH) Posaconazole (Noxafil) Barbiturates (Phenobarbital) Carbamazepine (  Tegretol) Chlordiazepoxide (Librium) Cholestyramine (Questran) Griseofulvin Oral Contraceptives Rifampin Sucralfate (Carafate) Vitamin K   Coumadin (Warfarin) Major Herbal Interactions  Increased Warfarin Effect Decreased Warfarin Effect  Garlic Ginseng Ginkgo biloba Coenzyme Q10 Green tea St. Johns wort    Coumadin (Warfarin)  FOOD Interactions  Eat a consistent number of servings per week of foods HIGH in Vitamin K (1 serving =  cup)  Collards (cooked, or boiled & drained) Kale (cooked, or boiled & drained) Mustard greens (cooked, or boiled & drained) Parsley *serving size only =  cup Spinach (cooked, or boiled & drained) Swiss chard (cooked, or boiled & drained) Turnip greens (cooked, or boiled & drained)  Eat a consistent number of servings per week of foods MEDIUM-HIGH in Vitamin K (1 serving = 1 cup)  Asparagus (cooked, or boiled & drained) Broccoli (cooked, boiled & drained, or raw & chopped) Brussel sprouts (cooked, or boiled & drained) *serving size only =  cup Lettuce, raw (green leaf, endive, romaine) Spinach, raw Turnip greens, raw & chopped   These websites have more information on Coumadin (warfarin):  FailFactory.se; VeganReport.com.au;   Amiodarone tablets What is this medicine? AMIODARONE (a MEE oh da rone) is an antiarrhythmic drug. It helps make your heart beat regularly. Because of the side effects caused by this medicine, it is only used when other medicines have not worked. It is usually used for heartbeat problems that may be life threatening. This medicine may be used for other purposes; ask your health care provider or pharmacist if you have questions. What should I tell my health care provider before I take this medicine? They need to know if you have any of these conditions: -liver disease -lung disease -other heart problems -thyroid disease -an unusual or allergic reaction to amiodarone, iodine, other medicines, foods, dyes, or preservatives -pregnant or trying to get pregnant -breast-feeding How should I use this medicine? Take this medicine by mouth with a glass of water. Follow the directions on the prescription label. You can take this medicine with or without food. However, you should always take it the same way each time. Take your doses at regular  intervals. Do not take your medicine more often than directed. Do not stop taking except on the advice of your doctor or health care professional. A special MedGuide will be given to you by the pharmacist with each prescription and refill. Be sure to read this information carefully each time. Talk to your pediatrician regarding the use of this medicine in children. Special care may be needed. Overdosage: If you think you have taken too much of this medicine contact a poison control center or emergency room at once. NOTE: This medicine is only for you. Do not share this medicine with others. What if I miss a dose? If you miss a dose, take it as soon as you can. If it is almost time for your next dose, take only that dose. Do not take double or extra doses. What may interact with this medicine? Do not take this medicine with any of the following medications: -abarelix -apomorphine -arsenic trioxide -certain antibiotics like erythromycin, gemifloxacin, levofloxacin, pentamidine -certain medicines for depression like amoxapine, tricyclic antidepressants -certain medicines for fungal infections like fluconazole, itraconazole, ketoconazole, posaconazole, voriconazole -certain medicines for irregular heart beat like disopyramide, dofetilide, dronedarone, ibutilide, propafenone, sotalol -certain medicines for malaria like chloroquine, halofantrine -cisapride -droperidol -haloperidol -hawthorn -maprotiline -methadone -phenothiazines like chlorpromazine, mesoridazine, thioridazine -pimozide -ranolazine -red yeast rice -vardenafil -ziprasidone This medicine may also interact with the  following medications: -antiviral medicines for HIV or AIDS -certain medicines for blood pressure, heart disease, irregular heart beat -certain medicines for cholesterol like atorvastatin, cerivastatin, lovastatin, simvastatin -certain medicines for hepatitis C like sofosbuvir and ledipasvir; sofosbuvir -certain  medicines for seizures like phenytoin -certain medicines for thyroid problems -certain medicines that treat or prevent blood clots like warfarin -cholestyramine -cimetidine -clopidogrel -cyclosporine -dextromethorphan -diuretics -fentanyl -general anesthetics -grapefruit juice -lidocaine -loratadine -methotrexate -other medicines that prolong the QT interval (cause an abnormal heart rhythm) -procainamide -quinidine -rifabutin, rifampin, or rifapentine -St. John's Wort -trazodone This list may not describe all possible interactions. Give your health care provider a list of all the medicines, herbs, non-prescription drugs, or dietary supplements you use. Also tell them if you smoke, drink alcohol, or use illegal drugs. Some items may interact with your medicine. What should I watch for while using this medicine? Your condition will be monitored closely when you first begin therapy. Often, this drug is first started in a hospital or other monitored health care setting. Once you are on maintenance therapy, visit your doctor or health care professional for regular checks on your progress. Because your condition and use of this medicine carry some risk, it is a good idea to carry an identification card, necklace or bracelet with details of your condition, medications, and doctor or health care professional. Dennis Bast may get drowsy or dizzy. Do not drive, use machinery, or do anything that needs mental alertness until you know how this medicine affects you. Do not stand or sit up quickly, especially if you are an older patient. This reduces the risk of dizzy or fainting spells. This medicine can make you more sensitive to the sun. Keep out of the sun. If you cannot avoid being in the sun, wear protective clothing and use sunscreen. Do not use sun lamps or tanning beds/booths. You should have regular eye exams before and during treatment. Call your doctor if you have blurred vision, see halos, or your  eyes become sensitive to light. Your eyes may get dry. It may be helpful to use a lubricating eye solution or artificial tears solution. If you are going to have surgery or a procedure that requires contrast dyes, tell your doctor or health care professional that you are taking this medicine. What side effects may I notice from receiving this medicine? Side effects that you should report to your doctor or health care professional as soon as possible: -allergic reactions like skin rash, itching or hives, swelling of the face, lips, or tongue -blue-gray coloring of the skin -blurred vision, seeing blue green halos, increased sensitivity of the eyes to light -breathing problems -chest pain -dark urine -fast, irregular heartbeat -feeling faint or light-headed -intolerance to heat or cold -nausea or vomiting -pain and swelling of the scrotum -pain, tingling, numbness in feet, hands -redness, blistering, peeling or loosening of the skin, including inside the mouth -spitting up blood -stomach pain -sweating -unusual or uncontrolled movements of body -unusually weak or tired -weight gain or loss -yellowing of the eyes or skin Side effects that usually do not require medical attention (report to your doctor or health care professional if they continue or are bothersome): -change in sex drive or performance -constipation -dizziness -headache -loss of appetite -trouble sleeping This list may not describe all possible side effects. Call your doctor for medical advice about side effects. You may report side effects to FDA at 1-800-FDA-1088. Where should I keep my medicine? Keep out of the reach of children. Store  at room temperature between 20 and 25 degrees C (68 and 77 degrees F). Protect from light. Keep container tightly closed. Throw away any unused medicine after the expiration date. NOTE: This sheet is a summary. It may not cover all possible information. If you have questions about this  medicine, talk to your doctor, pharmacist, or health care provider.    2016, Elsevier/Gold Standard. (2013-12-04 19:48:11)   Atrial Fibrillation Atrial fibrillation is a type of heartbeat that is irregular or fast (rapid). If you have this condition, your heart keeps quivering in a weird (chaotic) way. This condition can make it so your heart cannot pump blood normally. Having this condition gives a person more risk for stroke, heart failure, and other heart problems. There are different types of atrial fibrillation. Talk with your doctor to learn about the type that you have. HOME CARE  Take over-the-counter and prescription medicines only as told by your doctor.  If your doctor prescribed a blood-thinning medicine, take it exactly as told. Taking too much of it can cause bleeding. If you do not take enough of it, you will not have the protection that you need against stroke and other problems.  Do not use any tobacco products. These include cigarettes, chewing tobacco, and e-cigarettes. If you need help quitting, ask your doctor.  If you have apnea (obstructive sleep apnea), manage it as told by your doctor.  Do not drink alcohol.  Do not drink beverages that have caffeine. These include coffee, soda, and tea.  Maintain a healthy weight. Do not use diet pills unless your doctor says they are safe for you. Diet pills may make heart problems worse.  Follow diet instructions as told by your doctor.  Exercise regularly as told by your doctor.  Keep all follow-up visits as told by your doctor. This is important. GET HELP IF:  You notice a change in the speed, rhythm, or strength of your heartbeat.  You are taking a blood-thinning medicine and you notice more bruising.  You get tired more easily when you move or exercise. GET HELP RIGHT AWAY IF:  You have pain in your chest or your belly (abdomen).  You have sweating or weakness.  You feel sick to your stomach (nauseous).  You  notice blood in your throw up (vomit), poop (stool), or pee (urine).  You are short of breath.  You suddenly have swollen feet and ankles.  You feel dizzy.  Your suddenly get weak or numb in your face, arms, or legs, especially if it happens on one side of your body.  You have trouble talking, trouble understanding, or both.  Your face or your eyelid droops on one side. These symptoms may be an emergency. Do not wait to see if the symptoms will go away. Get medical help right away. Call your local emergency services (911 in the [place] Do not drive yourself to the hospital.   This information is not intended to replace advice given to you by your health care provider. Make sure you discuss any questions you have with your health care provider.   Document Released: 06/09/2008 Document Revised: 05/22/2015 Document Reviewed: 12/26/2014 Elsevier Interactive Patient Education 2016 [place].  Heart-Healthy Eating Plan Many factors influence your heart health, including eating and exercise habits. Heart (coronary) risk increases with abnormal blood fat (lipid) levels. Heart-healthy meal planning includes limiting unhealthy fats, increasing healthy fats, and making other small dietary changes. This includes maintaining a healthy body weight to help keep lipid levels  within a normal range. WHAT IS MY PLAN?  Your health care provider recommends that you:  Get no more than _________% of the total calories in your daily diet from fat.  Limit your intake of saturated fat to less than _________% of your total calories each day.  Limit the amount of cholesterol in your diet to less than _________ mg per day. WHAT TYPES OF FAT SHOULD I CHOOSE?  Choose healthy fats more often. Choose monounsaturated and polyunsaturated fats, such as olive oil and canola oil, flaxseeds, walnuts, almonds, and seeds.  Eat more omega-3 fats. Good choices include salmon, mackerel, sardines, tuna, flaxseed oil, and  ground flaxseeds. Aim to eat fish at least two times each week.  Limit saturated fats. Saturated fats are primarily found in animal products, such as meats, butter, and cream. Plant sources of saturated fats include palm oil, palm kernel oil, and coconut oil.  Avoid foods with partially hydrogenated oils in them. These contain trans fats. Examples of foods that contain trans fats are stick margarine, some tub margarines, cookies, crackers, and other baked goods. WHAT GENERAL GUIDELINES DO I NEED TO FOLLOW?  Check food labels carefully to identify foods with trans fats or high amounts of saturated fat.  Fill one half of your plate with vegetables and green salads. Eat 4-5 servings of vegetables per day. A serving of vegetables equals 1 cup of raw leafy vegetables,  cup of raw or cooked cut-up vegetables, or  cup of vegetable juice.  Fill one fourth of your plate with whole grains. Look for the word "whole" as the first word in the ingredient list.  Fill one fourth of your plate with lean protein foods.  Eat 4-5 servings of fruit per day. A serving of fruit equals one medium whole fruit,  cup of dried fruit,  cup of fresh, frozen, or canned fruit, or  cup of 100% fruit juice.  Eat more foods that contain soluble fiber. Examples of foods that contain this type of fiber are apples, broccoli, carrots, beans, peas, and barley. Aim to get 20-30 g of fiber per day.  Eat more home-cooked food and less restaurant, buffet, and fast food.  Limit or avoid alcohol.  Limit foods that are high in starch and sugar.  Avoid fried foods.  Cook foods by using methods other than frying. Baking, boiling, grilling, and broiling are all great options. Other fat-reducing suggestions include:  Removing the skin from poultry.  Removing all visible fats from meats.  Skimming the fat off of stews, soups, and gravies before serving them.  Steaming vegetables in water or broth.  Lose weight if you are  overweight. Losing just 5-10% of your initial body weight can help your overall health and prevent diseases such as diabetes and heart disease.  Increase your consumption of nuts, legumes, and seeds to 4-5 servings per week. One serving of dried beans or legumes equals  cup after being cooked, one serving of nuts equals 1 ounces, and one serving of seeds equals  ounce or 1 tablespoon.  You may need to monitor your salt (sodium) intake, especially if you have high blood pressure. Talk with your health care provider or dietitian to get more information about reducing sodium. WHAT FOODS CAN I EAT? Grains Breads, including [place], white, pita, wheat, raisin, rye, oatmeal, and [place]. Tortillas that are neither fried nor made with lard or trans fat. Low-fat rolls, including hotdog and hamburger buns and English muffins. Biscuits. Muffins. Waffles. Pancakes. Light popcorn. Whole-grain  cereals. Flatbread. Melba toast. Pretzels. Breadsticks. Rusks. Low-fat snacks and crackers, including oyster, saltine, matzo, graham, animal, and rye. Rice and pasta, including brown rice and those that are made with whole wheat. Vegetables All vegetables. Fruits All fruits, but limit coconut. Meats and Other Protein Sources Lean, well-trimmed beef, veal, pork, and lamb. Chicken and [place] without skin. All fish and shellfish. Wild duck, rabbit, pheasant, and venison. Egg whites or low-cholesterol egg substitutes. Dried beans, peas, lentils, and tofu.Seeds and most nuts. Dairy Low-fat or nonfat cheeses, including ricotta, string, and mozzarella. Skim or 1% milk that is liquid, powdered, or evaporated. Buttermilk that is made with low-fat milk. Nonfat or low-fat yogurt. Beverages Mineral water. Diet carbonated beverages. Sweets and Desserts Sherbets and fruit ices. Honey, jam, marmalade, jelly, and syrups. Meringues and gelatins. Pure sugar candy, such as hard candy, jelly beans, gumdrops, mints, marshmallows, and  small amounts of dark chocolate. [employer] Eat all sweets and desserts in moderation. Fats and Oils Nonhydrogenated (trans-free) margarines. Vegetable oils, including soybean, sesame, sunflower, olive, peanut, safflower, corn, canola, and cottonseed. Salad dressings or mayonnaise that are made with a vegetable oil. Limit added fats and oils that you use for cooking, baking, salads, and as spreads. Other Cocoa powder. Coffee and tea. All seasonings and condiments. The items listed above may not be a complete list of recommended foods or beverages. Contact your dietitian for more options. WHAT FOODS ARE NOT RECOMMENDED? Grains Breads that are made with saturated or trans fats, oils, or whole milk. Croissants. Butter rolls. Cheese breads. Sweet rolls. Donuts. Buttered popcorn. Chow mein noodles. High-fat crackers, such as cheese or butter crackers. Meats and Other Protein Sources Fatty meats, such as hotdogs, short ribs, sausage, spareribs, bacon, ribeye roast or steak, and mutton. High-fat deli meats, such as salami and bologna. Caviar. Domestic duck and goose. Organ meats, such as kidney, liver, sweetbreads, brains, gizzard, chitterlings, and heart. Dairy Cream, sour cream, cream cheese, and creamed cottage cheese. Whole milk cheeses, including blue (bleu), Monte[place] Jack, [place], [place], [place], [place], Swiss, [place], [place], and [place]. Whole or 2% milk that is liquid, evaporated, or condensed. Whole buttermilk. Cream sauce or high-fat cheese sauce. Yogurt that is made from whole milk. Beverages Regular sodas and drinks with added sugar. Sweets and Desserts Frosting. Pudding. Cookies. Cakes other than angel food cake. Candy that has milk chocolate or white chocolate, hydrogenated fat, butter, coconut, or unknown ingredients. Buttered syrups. Full-fat ice cream or ice cream drinks. Fats and Oils Gravy that has suet, meat fat, or shortening. Cocoa butter, hydrogenated oils, palm oil,  coconut oil, palm kernel oil. These can often be found in baked products, candy, fried foods, nondairy creamers, and whipped toppings. Solid fats and shortenings, including bacon fat, salt pork, lard, and butter. Nondairy cream substitutes, such as coffee creamers and sour cream substitutes. Salad dressings that are made of unknown oils, cheese, or sour cream. The items listed above may not be a complete list of foods and beverages to avoid. Contact your dietitian for more information.   This information is not intended to replace advice given to you by your health care provider. Make sure you discuss any questions you have with your health care provider.   Document Released: 06/09/2008 Document Revised: 09/21/2014 Document Reviewed: 02/22/2014 Elsevier Interactive Patient Education [employer].

## 2015-12-17 DIAGNOSIS — I4891 Unspecified atrial fibrillation: Secondary | ICD-10-CM | POA: Diagnosis not present

## 2015-12-17 DIAGNOSIS — Z7901 Long term (current) use of anticoagulants: Secondary | ICD-10-CM | POA: Diagnosis not present

## 2015-12-17 DIAGNOSIS — I429 Cardiomyopathy, unspecified: Secondary | ICD-10-CM

## 2015-12-17 DIAGNOSIS — Z950 Presence of cardiac pacemaker: Secondary | ICD-10-CM | POA: Diagnosis not present

## 2015-12-17 LAB — PROTIME-INR
INR: 2.27 — ABNORMAL HIGH (ref 0.00–1.49)
Prothrombin Time: 24.8 seconds — ABNORMAL HIGH (ref 11.6–15.2)

## 2015-12-17 MED ORDER — WARFARIN SODIUM 4 MG PO TABS: 4.0000 mg | ORAL_TABLET | Freq: Once | ORAL | Status: DC

## 2015-12-17 MED ORDER — POTASSIUM CHLORIDE CRYS ER 20 MEQ PO TBCR
40.0000 meq | EXTENDED_RELEASE_TABLET | Freq: Once | ORAL | Status: AC
  Administered 2015-12-17: 40 meq via ORAL
  Filled 2015-12-17: qty 2

## 2015-12-17 MED ORDER — WARFARIN SODIUM 2 MG PO TABS
3.0000 mg | ORAL_TABLET | Freq: Once | ORAL | Status: AC
  Administered 2015-12-17: 3 mg via ORAL
  Filled 2015-12-17: qty 1

## 2015-12-17 NOTE — Progress Notes (Addendum)
ANTICOAGULATION CONSULT NOTE  Pharmacy Consult for warfarin Indication: atrial fibrillation  No Known Allergies  Patient Measurements: Height: 5\' 5"  (165.1 cm) Weight: 138 lb 3.2 oz (62.687 kg) IBW/kg (Calculated) : 57  Vital Signs: Temp: 98.4 F (36.9 C) (04/04 1134) Temp Source: Oral (04/04 1134) BP: 107/68 mmHg (04/04 1134) Pulse Rate: 59 (04/04 1134)  Labs:  Recent Labs  12/15/15 0722 12/16/15 0506 12/17/15 0335  HGB 12.1  --   --   HCT 38.1  --   --   PLT 160  --   --   LABPROT 24.5* 25.9* 24.8*  INR 2.23* 2.40* 2.27*  CREATININE 1.29* 1.29*  --     Estimated Creatinine Clearance: 26.1 mL/min (by C-G formula based on Cr of 1.29).   Medical History: Past Medical History  Diagnosis Date  . Malleolar fracture   . Hypertension   . Dementia     mild  . Dyslipidemia   . Glaucoma   . Anemia   . Cardiomyopathy     presumed ischemic in the past with an EF of 35%. The most recent EF in 03-2008, however, was 55 -60%  . Chronic renal insufficiency   . Bradycardia   . Paroxysmal atrial fibrillation (HCC)   . Arthritis     "knees especially"    Assessment:  82 y/o F on 04/02 with weakness. Pharmacy consulted to start warfarin from pta for afib. Admission INR 2.23, hgb/plts wnl. Started on amiodarone 4/2.   PTA warfarin: 3 mg daily except 4mg  on Wed  Goal of Therapy:  INR 2-3 Monitor platelets   Plan:  Warfarin 3 mg x 1 po tonight Daily INR/CBC Monitor for S&S of bleed   Thank you for allowing [place] to participate in this patients care. Jens Som, PharmD Pager: [phone] 12/17/2015,1:27 PM

## 2015-12-17 NOTE — Evaluation (Signed)
Physical Therapy Evaluation Patient Details Name: [name] MRN: [id number] DOB: 16-May-1923 Today's Date: 12/17/2015   History of Present Illness  Pt is a 82 y/o F who called EMS because she was feeling weak and she could tell her HR was elevated.  Pt's PMH includes mild dementia, anemia, glaucoma, malleolar fx, permanent placemaker insertion.      Clinical Impression  Pt admitted with above diagnosis. Pt currently with functional limitations due to the deficits listed below (see PT Problem List). Ms. Wooddell has good family support, daughter will be able to provide 24/7 supervision for a few days following d/c.  Pt currently requires supervision for safety and transfers. HR up to 101 while ambulating 80 ft in hallway using RW.  Pt will benefit from skilled PT to increase their independence and safety with mobility to allow discharge to the venue listed below.      Follow Up Recommendations No PT follow up;Supervision for mobility/OOB    Equipment Recommendations  None recommended by PT    Recommendations for Other Services OT consult     Precautions / Restrictions Precautions Precautions: Fall Restrictions Weight Bearing Restrictions: No      Mobility  Bed Mobility Overal bed mobility: Modified Independent             General bed mobility comments: Use of bed rail w/ HOB elevated and increased time. No outside assist needed.  Transfers Overall transfer level: Needs assistance Equipment used: Rolling walker (2 wheeled) Transfers: Sit to/from Stand Sit to Stand: Supervision         General transfer comment: Supervision for safety.  Pt slow to stand but does not require outside assist.  Ambulation/Gait Ambulation/Gait assistance: Supervision Ambulation Distance (Feet): 80 Feet Assistive device: Rolling walker (2 wheeled) Gait Pattern/deviations: Step-through pattern;Decreased stride length;Trunk flexed   Gait velocity interpretation: at or above normal speed  for age/gender General Gait Details: Flexed posture and cues provided for proper WC management.  HR up to 101.    Stairs            Wheelchair Mobility    Modified Rankin (Stroke Patients Only)       Balance Overall balance assessment: Needs assistance (denies h/o falls in the past 6 months) Sitting-balance support: Feet supported;No upper extremity supported Sitting balance-Leahy Scale: Good     Standing balance support: During functional activity;Single extremity supported Standing balance-Leahy Scale: Fair Standing balance comment: Able to wipe w/ only 1 UE supported on RW                             Pertinent Vitals/Pain Pain Assessment: No/denies pain    Home Living Family/patient expects to be discharged to:: Private residence Living Arrangements: Alone Available Help at Discharge: Family;Available PRN/intermittently Type of Home: Apartment Home Access: Level entry     Home Layout: One level Home Equipment: Walker - 4 wheels;Cane - single point;Bedside commode (rollator)      Prior Function Level of Independence: Independent with assistive device(s)         Comments: Uses cane when going out of house. Uses rollator in her home when she is feeling tired or when she is walking around her apartment complex. Family drives.       Hand Dominance   Dominant Hand: Right    Extremity/Trunk Assessment   Upper Extremity Assessment: Overall WFL for tasks assessed           Lower Extremity Assessment:  Overall [facility] for tasks assessed      Cervical / Trunk Assessment: Kyphotic  Communication   Communication: No difficulties  Cognition Arousal/Alertness: Awake/alert Behavior During Therapy: WFL for tasks assessed/performed Overall Cognitive Status: Within Functional Limits for tasks assessed                      General Comments General comments (skin integrity, edema, etc.): Daughter present throughout evaluation and reports she  will provide 24/7 supervision for a few days after d/c    Exercises        Assessment/Plan    PT Assessment Patient needs continued PT services  PT Diagnosis Difficulty walking   PT Problem List Decreased activity tolerance;Decreased balance;Decreased knowledge of use of DME  PT Treatment Interventions DME instruction;Gait training;Functional mobility training;Therapeutic activities;Balance training;Therapeutic exercise;Patient/family education   PT Goals (Current goals can be found in the Care Plan section) Acute Rehab PT Goals Patient Stated Goal: to go home PT Goal Formulation: With patient/family Time For Goal Achievement: 12/31/15 Potential to Achieve Goals: Good    Frequency Min 3X/week   Barriers to discharge        Co-evaluation               End of Session Equipment Utilized During Treatment: Gait belt Activity Tolerance: Patient tolerated treatment well;Patient limited by fatigue Patient left: in chair;with call bell/phone within reach;with family/visitor present Nurse Communication: Mobility status;Other (comment) (HR)    Functional Assessment Tool Used: Clinical Judgement Functional Limitation: Mobility: Walking and moving around Mobility: Walking and Moving Around Current Status [phone]): At least 1 percent but less than 20 percent impaired, limited or restricted Mobility: Walking and Moving Around Goal Status [phone]): At least 1 percent but less than 20 percent impaired, limited or restricted    Time: 1036-1106 PT Time Calculation (min) (ACUTE ONLY): 30 min   Charges:   PT Evaluation $PT Eval Low Complexity: 1 Procedure PT Treatments $Gait Training: 8-22 mins   PT G Codes:   PT G-Codes **NOT FOR INPATIENT CLASS** Functional Assessment Tool Used: Clinical Judgement Functional Limitation: Mobility: Walking and moving around Mobility: Walking and Moving Around Current Status [id number]): At least 1 percent but less than 20 percent impaired, limited or  restricted Mobility: Walking and Moving Around Goal Status [phone]): At least 1 percent but less than 20 percent impaired, limited or restricted    Collie Siad PT, DPT  Pager: [phone] Phone: [phone] 12/17/2015, 12:11 PM

## 2015-12-17 NOTE — Care Management Obs Status (Signed)
[facility] NOTIFICATION   Patient Details  Name: Corrigan Schwenker MRN: [id number] Date of Birth: Feb 04, 1923   Medicare Observation Status Notification Given:  Yes (a fib)    Bethena Roys, RN 12/17/2015, 12:37 PM

## 2015-12-17 NOTE — Progress Notes (Signed)
Patient Name: [name] Date of Encounter: 12/17/2015   SUBJECTIVE  Feeling well. No chest pain, sob or palpitations.   CURRENT MEDS . amiodarone  400 mg Oral BID  . atorvastatin  10 mg Oral q1800  . furosemide  80 mg Oral QODAY  . latanoprost  1 drop Both Eyes QHS  . lisinopril  5 mg Oral Daily  . metoprolol  75 mg Oral BID  . multivitamin with minerals  1 tablet Oral Daily  . olopatadine  1 drop Both Eyes BID  . potassium chloride SA  20 mEq Oral Daily  . potassium chloride  40 mEq Oral Once  . sodium chloride flush  3 mL Intravenous Q12H  . Warfarin - Pharmacist Dosing Inpatient   Does not apply q1800    OBJECTIVE  Filed Vitals:   12/17/15 0146 12/17/15 0455 12/17/15 0500 12/17/15 0606  BP:  84/72 155/69 133/68  Pulse:  72    Temp:  98.2 F (36.8 C)    TempSrc:  Oral    Resp:  21  19  Height:      Weight: 138 lb 3.2 oz (62.687 kg)     SpO2:  96%      Intake/Output Summary (Last 24 hours) at 12/17/15 0910 Last data filed at 12/17/15 0146  Gross per 24 hour  Intake    123 ml  Output    325 ml  Net   -202 ml   Filed Weights   12/15/15 1730 12/16/15 0412 12/17/15 0146  Weight: 140 lb (63.504 kg) 139 lb 12.8 oz (63.413 kg) 138 lb 3.2 oz (62.687 kg)    PHYSICAL EXAM  General: Pleasant, frail elderly woman NAD. Neuro: Alert and oriented X 3. Moves all extremities spontaneously. Psych: Normal affect. HEENT:  Normal  Neck: Supple without bruits or JVD. Lungs:  Resp regular and unlabored, CTA. Heart: RRR no s3, s4, or murmurs. Abdomen: Soft, non-tender, non-distended, BS + x 4.  Extremities: No clubbing, cyanosis or edema. DP/PT/Radials 2+ and equal bilaterally.  Accessory Clinical Findings  CBC  Recent Labs  12/15/15 0722  WBC 10.6*  HGB 12.1  HCT 38.1  MCV 95.7  PLT 0000000   Basic Metabolic Panel  Recent Labs  12/15/15 0722 12/16/15 0506  NA 143 145  K 3.7 3.2*  CL 101 106  CO2 29 31  GLUCOSE 116* 114*  BUN 27* 23*  CREATININE 1.29*  1.29*  CALCIUM 9.7 9.5  MG  --  2.1   Liver Function Tests  Recent Labs  12/16/15 0506  AST 21  ALT 34  ALKPHOS 48  BILITOT 1.0  PROT 6.0*  ALBUMIN 3.4*   No results for input(s): LIPASE, AMYLASE in the last 72 hours. Cardiac Enzymes No results for input(s): CKTOTAL, CKMB, CKMBINDEX, TROPONINI in the last 72 hours. BNP Invalid input(s): POCBNP D-Dimer No results for input(s): DDIMER in the last 72 hours. Hemoglobin A1C No results for input(s): HGBA1C in the last 72 hours. Fasting Lipid Panel No results for input(s): CHOL, HDL, LDLCALC, TRIG, CHOLHDL, LDLDIRECT in the last 72 hours. Thyroid Function Tests  Recent Labs  12/16/15 0506  TSH 3.865    TELE  Sinus rhythm with PVCs and intermittent vent. [place].   Radiology/Studies  Dg Chest Portable 1 View  12/15/2015  CLINICAL DATA:  Atrial fibrillation. EXAM: PORTABLE CHEST 1 VIEW COMPARISON:  September 19, 2011. FINDINGS: Stable cardiomegaly. No pneumothorax is noted. Mild bibasilar opacities are noted most consistent with subsegmental atelectasis. Left-sided pacemaker  is unchanged in position. Bony thorax is unremarkable. IMPRESSION: Mild bibasilar subsegmental atelectasis.  Stable cardiomegaly. Electronically Signed   By: Marijo Conception, M.D.   On: 12/15/2015 07:42    ASSESSMENT AND PLAN  1. Atrial fibrillation with RVR (HCC) - Presented with afib rvr at rate of 140s. Initially started on IV cardizem however discontinued due to given hx of LV dysfunction (EF of 20% on last echo 06/2011). She was then placed on IV amiodarone and converted to sinus rhythm. Currently on amiodarone 400mg  BID. Maintaining sinus rhythm--> plan to decrease amiodarone to 400mg  at discharge. She was pacer as back up for SSS. Continue coumadin per pharmacy. TSH normal.  - Inpatient vs outpatient echo? Will discuss with MD.   2. Chronic systolic CHF - No sign of volume overload. Continue lasix, lisinopril, lopressor.   3.Hypokalemia - Will  give additional 65meq once to supplemental daily dose.   4.  Chronic renal insufficiency, stage III (moderate) - Scr at baseline  5.   Essential hypertension - Relatively stable. Continue current regimen  6. HL - 10/31/2015: Cholesterol 135; HDL 96; LDL Cholesterol 28; Triglycerides 55; VLDL 11  - Continue statin  Dispo: pending PT consult. She lives by her self. Likely discharge later today.     Signed, Bhagat,Bhavinkumar PA-C Pager [phone]   I have examined the patient and reviewed assessment and plan and discussed with patient.  Agree with above as stated.  NSR maintained.  Continue amiodarone as OP, 400 mg daily.  Would decrease to 200 mg daily at a later time.  Awaiting PT recs to help with discharge.  Rosilyn Coachman S.

## 2015-12-17 NOTE — Progress Notes (Signed)
MD on call notified of pt requesting to stay another day. Per pt she would like to stay another day to get her strength up. Case Mgmt called & stated that insurance may not cover her stay for another day d/t pt being in  Observation status as well as pt being on PO amio in NSR. MD stated he would call RN back. Will continue to monitor the pt. Hoover Brunette, RN

## 2015-12-18 ENCOUNTER — Other Ambulatory Visit: Payer: Self-pay | Admitting: Cardiology

## 2015-12-18 DIAGNOSIS — I4891 Unspecified atrial fibrillation: Secondary | ICD-10-CM | POA: Diagnosis not present

## 2015-12-18 DIAGNOSIS — Z7901 Long term (current) use of anticoagulants: Secondary | ICD-10-CM | POA: Diagnosis not present

## 2015-12-18 DIAGNOSIS — Z950 Presence of cardiac pacemaker: Secondary | ICD-10-CM | POA: Diagnosis not present

## 2015-12-18 LAB — CBC
HCT: 36.7 % (ref 36.0–46.0)
Hemoglobin: 12 g/dL (ref 12.0–15.0)
MCH: 31.5 pg (ref 26.0–34.0)
MCHC: 32.7 g/dL (ref 30.0–36.0)
MCV: 96.3 fL (ref 78.0–100.0)
Platelets: 164 10*3/uL (ref 150–400)
RBC: 3.81 MIL/uL — ABNORMAL LOW (ref 3.87–5.11)
RDW: 13.5 % (ref 11.5–15.5)
WBC: 13 10*3/uL — ABNORMAL HIGH (ref 4.0–10.5)

## 2015-12-18 LAB — PROTIME-INR
INR: 2.1 — ABNORMAL HIGH (ref 0.00–1.49)
Prothrombin Time: 23.4 seconds — ABNORMAL HIGH (ref 11.6–15.2)

## 2015-12-18 MED ORDER — AMIODARONE HCL 400 MG PO TABS: 400.0000 mg | ORAL_TABLET | Freq: Every day | ORAL | Status: DC

## 2015-12-18 MED ORDER — WARFARIN SODIUM 2 MG PO TABS: 3.0000 mg | ORAL_TABLET | Freq: Once | ORAL | Status: DC

## 2015-12-18 NOTE — Progress Notes (Addendum)
Discharge instructions & education done with pt. Pt's dgtr as well as son were both present & active during this education. Pt's dgtr was concerned about pt getting her night time meds (coumadin, metoprolol, & lipitor) d/t pt staying with her dgtr tonight & not having access to her home meds. RN told pt's dgtr that if she could not get them to call back & she would make sure that it's taken care of. Pt as well as her family stated that they no longer had any questions. Pt's IV was removed as well as telemetry box. CCMD was notified of the pt being discharged. Kathleen Brunette, RN    Pt's dgtr [name] notified of the pt leaving her purse in the room. Pt's dgtr stated she would come back to get it. Pt's dgtr also called RN about getting a one time dose of the night time meds. PA Luke notified of this & called them in for the pt.

## 2015-12-18 NOTE — Progress Notes (Signed)
ANTICOAGULATION CONSULT NOTE  Pharmacy Consult for warfarin Indication: atrial fibrillation  No Known Allergies  Patient Measurements: Height: 5\' 5"  (165.1 cm) Weight: 139 lb (63.05 kg) IBW/kg (Calculated) : 57  Vital Signs: Temp: 98.3 F (36.8 C) (04/05 0311) Temp Source: Oral (04/05 0311) BP: 123/81 mmHg (04/05 1055) Pulse Rate: 63 (04/05 1055)  Labs:  Recent Labs  12/16/15 0506 12/17/15 0335 12/18/15 0619  HGB  --   --  12.0  HCT  --   --  36.7  PLT  --   --  164  LABPROT 25.9* 24.8* 23.4*  INR 2.40* 2.27* 2.10*  CREATININE 1.29*  --   --     Estimated Creatinine Clearance: 26.1 mL/min (by C-G formula based on Cr of 1.29).   Medical History: Past Medical History  Diagnosis Date  . Malleolar fracture   . Hypertension   . Dementia     mild  . Dyslipidemia   . Glaucoma   . Anemia   . Cardiomyopathy     presumed ischemic in the past with an EF of 35%. The most recent EF in 03-2008, however, was 55 -60%  . Chronic renal insufficiency   . Bradycardia   . Paroxysmal atrial fibrillation (HCC)   . Arthritis     "knees especially"    Assessment:  82 y/o F on 04/02 with weakness. Pharmacy consulted to dose warfarin from pta for afib. INR 2.1, hgb/plts wnl. Started on amiodarone 4/2. Plan for close f/u INR on discharge.  PTA warfarin: 3 mg daily except 4mg  on Wed  Goal of Therapy:  INR 2-3 Monitor platelets   Plan:  Warfarin 3 mg x 1 po tonight Daily INR/CBC Monitor for S&S of bleed, PO intake, and drug interactions   Thank you for allowing [place] to participate in this patients care. Jens Som, PharmD Pager: [phone] 12/18/2015,11:23 AM

## 2015-12-18 NOTE — Progress Notes (Signed)
Patient Profile: 82 y/o AA female with a history of AF and SSS. She had a MDT Jan 2013. She lives alone and is on Coumadin. She presented to ED 12/15/15 with complaint of weakness and was found to be in atrial fibrillation w/ RVR.   Subjective: Feels great. Symptoms resolved. She is ready to go home.   Objective: Vital signs in last 24 hours: Temp:  [97.8 F (36.6 C)-98.4 F (36.9 C)] 98.3 F (36.8 C) (04/05 0311) Pulse Rate:  [59-76] 62 (04/05 0311) Resp:  [18] 18 (04/04 1134) BP: (107-130)/(65-84) 127/83 mmHg (04/05 0311) SpO2:  [93 %-98 %] 93 % (04/05 0311) Weight:  [139 lb (63.05 kg)] 139 lb (63.05 kg) (04/05 0248) Last BM Date: 12/17/15  Intake/Output from previous day: 04/04 0701 - 04/05 0700 In: 480 [P.O.:480] Out: 180 [Urine:180] Intake/Output this shift:    Medications Current Facility-Administered Medications  Medication Dose Route Frequency Provider Last Rate Last Dose  . 0.9 %  sodium chloride infusion  250 mL Intravenous PRN Erlene Quan, PA-C      . acetaminophen (TYLENOL) tablet 650 mg  650 mg Oral Q4H PRN Erlene Quan, PA-C      . amiodarone (PACERONE) tablet 400 mg  400 mg Oral BID Jettie Booze, MD   400 mg at 12/17/15 2215  . atorvastatin (LIPITOR) tablet 10 mg  10 mg Oral q1800 Doreene Burke Kilroy, PA-C   10 mg at 12/17/15 1715  . diltiazem (CARDIZEM) 100 mg in dextrose 5 % 100 mL (1 mg/mL) infusion  5-15 mg/hr Intravenous Continuous Erlene Quan, PA-C   5 mg/hr at 12/15/15 1058  . furosemide (LASIX) tablet 80 mg  80 mg Oral QODAY Luke K Kilroy, PA-C   80 mg at 12/16/15 1007  . latanoprost (XALATAN) 0.005 % ophthalmic solution 1 drop  1 drop Both Eyes QHS Erlene Quan, PA-C   1 drop at 12/16/15 2216  . lisinopril (PRINIVIL,ZESTRIL) tablet 5 mg  5 mg Oral Daily Erlene Quan, PA-C   5 mg at 12/17/15 1020  . metoprolol tartrate (LOPRESSOR) tablet 75 mg  75 mg Oral BID Erlene Quan, PA-C   75 mg at 12/17/15 2215  . multivitamin with minerals tablet 1  tablet  1 tablet Oral Daily Erlene Quan, PA-C   1 tablet at 12/17/15 1021  . nitroGLYCERIN (NITROSTAT) SL tablet 0.4 mg  0.4 mg Sublingual Q5 Min x 3 PRN Luke K Kilroy, PA-C      . olopatadine (PATANOL) 0.1 % ophthalmic solution 1 drop  1 drop Both Eyes BID Erlene Quan, PA-C   1 drop at 12/17/15 2214  . ondansetron (ZOFRAN) injection 4 mg  4 mg Intravenous Q6H PRN Doreene Burke Kilroy, PA-C      . potassium chloride SA (K-DUR,KLOR-CON) CR tablet 20 mEq  20 mEq Oral Daily Bhavinkumar Bhagat, PA   20 mEq at 12/17/15 1021  . sodium chloride flush (NS) 0.9 % injection 3 mL  3 mL Intravenous Q12H Luke K Kilroy, PA-C   3 mL at 12/17/15 1022  . sodium chloride flush (NS) 0.9 % injection 3 mL  3 mL Intravenous PRN Erlene Quan, PA-C      . Warfarin - Pharmacist Dosing Inpatient   Does not apply q1800 Myrene Galas, [facility]        PE: General appearance: alert, cooperative, no distress and elderly and frail Neck: no carotid bruit and no JVD Lungs: clear to auscultation  bilaterally Heart: regular rate and rhythm, S1, S2 normal, no murmur, click, rub or gallop Extremities: no LEE Pulses: 2+ and symmetric Skin: warm and dry Neurologic: Grossly normal  Lab Results:   Recent Labs  12/18/15 0619  WBC 13.0*  HGB 12.0  HCT 36.7  PLT 164   BMET  Recent Labs  12/16/15 0506  NA 145  K 3.2*  CL 106  CO2 31  GLUCOSE 114*  BUN 23*  CREATININE 1.29*  CALCIUM 9.5   PT/INR  Recent Labs  12/16/15 0506 12/17/15 0335 12/18/15 0619  LABPROT 25.9* 24.8* 23.4*  INR 2.40* 2.27* 2.10*     Assessment/Plan  Principal Problem:   Atrial fibrillation with RVR (HCC) Active Problems:   Dyslipidemia   Essential hypertension   Cardiomyopathy EF 20% by echo [facility])   Chronic renal insufficiency, stage III (moderate)   Pacemaker- [employer]   Chronic anticoagulation   1. Atrial Fibrillation: Presented with afib rvr at rate of 140s. Initially started on IV cardizem however discontinued due to given  hx of LV dysfunction (EF of 20% on last echo 06/2011). She was then placed on IV amiodarone and converted to sinus rhythm. Currently on amiodarone 400mg  BID. Maintaining sinus rhythm--> plan to decrease amiodarone to 400mg  at discharge. She has pacer as back up for SSS. Continue coumadin per pharmacy. INR is therapeutic at 2.10.   2. Chronic Systolic CHF: volume stable. Continue ACE-I and BB. Continue PO lasix.   3. HTN: well controlled on current regimen.   4. Chronic Renal Insufficiency: renal function has remained stable this admission  5. HLD: well controlled. Lipid panel 10/31/2015: Cholesterol 135; HDL 96; LDL Cholesterol 28; Triglycerides 55; VLDL 11. Continue statin.     Dispo: likely discharge home today, once cleared by MD.    LOS: 3 days    Brittainy M. Rosita Fire, PA-C 12/18/2015 9:12 AM  I have examined the patient and reviewed assessment and plan and discussed with patient.  Agree with above as stated.  Decrease amio to 400 mg daily.  THis could likely be decreased to 200 mg daily at her office f/u visit.  She walked.  She passed PT eval yesterday as well.  Discharge later today.  Tayley Mudrick S.

## 2015-12-18 NOTE — Discharge Summary (Signed)
Discharge Summary    Patient ID: [name],  MRN: [id number], DOB/AGE: 1923/07/31 82 y.o.  Admit date: 12/15/2015 Discharge date: 12/18/2015  Primary Care Provider: Chriss Czar Primary Cardiologist: Dr. Percival Spanish  Discharge Diagnoses    Principal Problem:   Atrial fibrillation with RVR ([place]) Active Problems:   Dyslipidemia   Essential hypertension   Cardiomyopathy EF 20% by echo [facility])   Chronic renal insufficiency, stage III (moderate)   Pacemaker- Medtronic   Chronic anticoagulation   Allergies No Known Allergies  Diagnostic Studies/Procedures    None  History of Present Illness     82 y/o AA female with a history of AF and SSS. She had a MDT Jan 2013. She lives alone and is on Coumadin. She presented to ED 12/15/15 with complaint of weakness and was found to be in atrial fibrillation w/ RVR.   Hospital Course     Presented with afib w/ RVR at rate of 140s. Initially started on IV cardizem however discontinued  given hx of LV dysfunction (EF of 20% on last echo 06/2011). She was then placed on IV amiodarone and converted to sinus rhythm. She remained in SR on amiodarone and symptoms resolved. Her HR remained stable. She had no difficulties ambulating. Her coumadin was continued and her INR remained therapeutic. Her BP and volume remained stable. She was last seen and examined by Dr. Irish Lack who determined she was stable for discharge home. Her amiodarone was reduced to 400 mg daily at time of discharge. Post hospital f/u has been arranged for 12/24/15 with Kerin Ransom, PA-C. Per Dr. Irish Lack, her amiodarone can be decreased to 200 mg daily after her office visit.    Discharge Vitals Blood pressure 123/81, pulse 63, temperature 98.3 F (36.8 C), temperature source Oral, resp. rate 18, height 5\' 5"  (1.651 m), weight 139 lb (63.05 kg), SpO2 93 %.  Filed Weights   12/16/15 0412 12/17/15 0146 12/18/15 0248  Weight: 139 lb 12.8 oz (63.413 kg) 138 lb 3.2 oz (62.687 kg)  139 lb (63.05 kg)    Labs & Radiologic Studies    CBC  Recent Labs  12/18/15 0619  WBC 13.0*  HGB 12.0  HCT 36.7  MCV 96.3  PLT 123456   Basic Metabolic Panel  Recent Labs  12/16/15 0506  NA 145  K 3.2*  CL 106  CO2 31  GLUCOSE 114*  BUN 23*  CREATININE 1.29*  CALCIUM 9.5  MG 2.1   Liver Function Tests  Recent Labs  12/16/15 0506  AST 21  ALT 34  ALKPHOS 48  BILITOT 1.0  PROT 6.0*  ALBUMIN 3.4*   No results for input(s): LIPASE, AMYLASE in the last 72 hours. Cardiac Enzymes No results for input(s): CKTOTAL, CKMB, CKMBINDEX, TROPONINI in the last 72 hours. BNP Invalid input(s): POCBNP D-Dimer No results for input(s): DDIMER in the last 72 hours. Hemoglobin A1C No results for input(s): HGBA1C in the last 72 hours. Fasting Lipid Panel No results for input(s): CHOL, HDL, LDLCALC, TRIG, CHOLHDL, LDLDIRECT in the last 72 hours. Thyroid Function Tests  Recent Labs  12/16/15 0506  TSH 3.865   _____________  Dg Chest Portable 1 View  12/15/2015  CLINICAL DATA:  Atrial fibrillation. EXAM: PORTABLE CHEST 1 VIEW COMPARISON:  September 19, 2011. FINDINGS: Stable cardiomegaly. No pneumothorax is noted. Mild bibasilar opacities are noted most consistent with subsegmental atelectasis. Left-sided pacemaker is unchanged in position. Bony thorax is unremarkable. IMPRESSION: Mild bibasilar subsegmental atelectasis.  Stable cardiomegaly. Electronically Signed  By: Marijo Conception, M.D.   On: 12/15/2015 07:42   Disposition   Pt is being discharged home today in good condition.  Follow-up Plans & Appointments    Follow-up Information    Follow up with Erlene Quan, PA-C On 12/24/2015.   Specialties:  Cardiology, Radiology   Why:  9:00 AM (Dr. Jillyn Ledger PA)   Contact information:   [address] [place] [address] [phone]      Discharge Instructions    Diet - low sodium heart healthy    Complete by:  As directed      Increase activity  slowly    Complete by:  As directed            Discharge Medications   Discharge Medication List as of 12/18/2015 11:58 AM    START taking these medications   Details  amiodarone (PACERONE) 400 MG tablet Take 1 tablet (400 mg total) by mouth daily., Starting 12/18/2015, Until Discontinued, Normal      CONTINUE these medications which have NOT CHANGED   Details  atorvastatin (LIPITOR) 10 MG tablet TAKE 1 TABLET BY MOUTH EVERY DAY. PLEASE CALL AND SCHEDULE A DECEMBER APPT., Normal    furosemide (LASIX) 80 MG tablet TAKE ONE TABLET BY MOUTH EVERY DAY, Normal    lisinopril (PRINIVIL,ZESTRIL) 5 MG tablet TAKE 1 TABLET(5 MG) BY MOUTH DAILY, Normal    metoprolol (LOPRESSOR) 50 MG tablet Take 1.5 tablets (75 mg total) by mouth 2 (two) times daily., Starting 08/05/2015, Until Discontinued, Normal    Multiple Vitamin (MULTIVITAMIN) tablet Take 1 tablet by mouth daily.  , Until Discontinued, Historical Med    Olopatadine HCl (PATADAY) 0.2 % SOLN Place 1 drop into both eyes daily. , Until Discontinued, Historical Med    potassium chloride SA (K-DUR,KLOR-CON) 20 MEQ tablet TAKE ONE TABLET BY MOUTH ONE TIME DAILY, Normal    TRAVATAN Z 0.004 % SOLN ophthalmic solution Place 1 drop into both eyes at bedtime., Starting 07/09/2015, Until Discontinued, Historical Med    warfarin (COUMADIN) 2 MG tablet TAKE 1 AND 1/2 TO 2 TABLETS BY MOUTH DAILY AS DIRECTED BY COUMADIN CLINIC, Normal           Outstanding Labs/Studies   None Note: Per Dr. Irish Lack, her amiodarone can be decreased to 200 mg daily after her office visit.    Duration of Discharge Encounter   Greater than 30 minutes including physician time.  Signed, SIMMONS, BRITTAINY PA-C 12/18/2015, 2:42 PM   I have examined the patient and reviewed assessment and plan and discussed with patient. Agree with above as stated. Decrease amio to 400 mg daily. THis could likely be decreased to 200 mg daily at her office f/u visit. She  walked. She passed PT eval yesterday as well. Discharge later today.  Donavin Audino S.

## 2015-12-19 ENCOUNTER — Telehealth: Payer: Self-pay | Admitting: Cardiology

## 2015-12-19 NOTE — Telephone Encounter (Signed)
New message     Daughter calling C/O having trouble sleeping at night. Please advise.

## 2015-12-19 NOTE — Telephone Encounter (Signed)
Spoke with pt's DPR who stated her mother did not sleep well last night. Pt was just discharged from the hospital yesterday. Pt has no complaint of palpitations, SOB, or chest pain. Encouraged pt's daughter to see how her mother sleeps tonight and over the next few days. If she develops any problems, such as palpitations, SOB, chest pains, or swelling to call us back and if its a life-threatening emergency to call 911. Pt's DPR verbalized understanding. Encouraged them to consult with her PCP with non-cardiac sleeping problems continue.

## 2015-12-19 NOTE — Telephone Encounter (Signed)
Not sure if she uses 2 mg or 3 or both

## 2015-12-24 ENCOUNTER — Ambulatory Visit (INDEPENDENT_AMBULATORY_CARE_PROVIDER_SITE_OTHER): Payer: Medicare Other | Admitting: Pharmacist Clinician (PhC)/ Clinical Pharmacy Specialist

## 2015-12-24 ENCOUNTER — Encounter: Payer: Self-pay | Admitting: Cardiology

## 2015-12-24 ENCOUNTER — Ambulatory Visit (INDEPENDENT_AMBULATORY_CARE_PROVIDER_SITE_OTHER): Payer: Medicare Other | Admitting: Cardiology

## 2015-12-24 VITALS — BP 118/80 | HR 81 | Ht 65.0 in | Wt 139.0 lb

## 2015-12-24 DIAGNOSIS — I10 Essential (primary) hypertension: Secondary | ICD-10-CM | POA: Diagnosis not present

## 2015-12-24 DIAGNOSIS — N183 Chronic kidney disease, stage 3 unspecified: Secondary | ICD-10-CM

## 2015-12-24 DIAGNOSIS — I4891 Unspecified atrial fibrillation: Secondary | ICD-10-CM

## 2015-12-24 DIAGNOSIS — E785 Hyperlipidemia, unspecified: Secondary | ICD-10-CM

## 2015-12-24 DIAGNOSIS — I429 Cardiomyopathy, unspecified: Secondary | ICD-10-CM

## 2015-12-24 DIAGNOSIS — N189 Chronic kidney disease, unspecified: Secondary | ICD-10-CM

## 2015-12-24 DIAGNOSIS — Z5181 Encounter for therapeutic drug level monitoring: Secondary | ICD-10-CM

## 2015-12-24 DIAGNOSIS — Z950 Presence of cardiac pacemaker: Secondary | ICD-10-CM

## 2015-12-24 DIAGNOSIS — Z7901 Long term (current) use of anticoagulants: Secondary | ICD-10-CM | POA: Diagnosis not present

## 2015-12-24 LAB — POCT INR: INR: 3.9

## 2015-12-24 MED ORDER — AMIODARONE HCL 400 MG PO TABS: 200.0000 mg | ORAL_TABLET | Freq: Every day | ORAL | Status: DC

## 2015-12-24 NOTE — Patient Instructions (Addendum)
Your physician has recommended you make the following change in your medication:   1.) the amiodarone has been decreased from 400 mg daily to 200 mg daily. ( 1/2 tablet)  Your physician recommends that you return for lab work ( BMP) next week when you have your INR checked at [facility].  Keep planned appointment with Dr Percival Spanish in May.

## 2015-12-24 NOTE — Assessment & Plan Note (Signed)
Last GFR 41

## 2015-12-24 NOTE — Assessment & Plan Note (Signed)
Admitted 12/15/15 with AF with RVR- Amiodarone added Decrease to 200 mg today

## 2015-12-24 NOTE — Assessment & Plan Note (Signed)
Presumed ischemic- compensated today

## 2015-12-24 NOTE — Assessment & Plan Note (Signed)
DOI 1/13 A-paced today

## 2015-12-24 NOTE — Progress Notes (Signed)
12/24/2015 [name]   [birth year]  [id number]  Primary Physician Chriss Czar, MD Primary Cardiologist: Dr Percival Spanish  HPI:  Pleasant 82 y/o AA female, lives alone, with a history of AF and SSS, cardiomyopathy, presumably ischemic, with an EF of  20% by echo at [facility] Oct 2012, chronic anticoagulation with Coumadin, and HTN.  She had a MDT pacemaker implanted in Jan 2013. She presented to ED at [facility] 12/15/15 with complaint of weakness and was found to be in atrial fibrillation w/ RVR. IV Amiodarone was started and she converted to NSR according to hospital records. Diltiazem was considered but not added secondary to her history of cardiomyopathy. TSOBSH and LFTs were WNL. She was discharged on 400 mg daily. She is in the office today for follow up. She has been doing well, no unusual SOB.    Current Outpatient Prescriptions  Medication Sig Dispense Refill  . amiodarone (PACERONE) 400 MG tablet Take 1 tablet (400 mg total) by mouth daily. 30 tablet 3  . atorvastatin (LIPITOR) 10 MG tablet TAKE 1 TABLET BY MOUTH AT BEDTIME 90 tablet 2  . furosemide (LASIX) 80 MG tablet TAKE ONE TABLET BY MOUTH EVERY DAY 90 tablet 3  . lisinopril (PRINIVIL,ZESTRIL) 5 MG tablet TAKE 1 TABLET(5 MG) BY MOUTH DAILY 90 tablet 3  . metoprolol (LOPRESSOR) 50 MG tablet Take 1.5 tablets (75 mg total) by mouth 2 (two) times daily. 90 tablet 6  . Multiple Vitamin (MULTIVITAMIN) tablet Take 1 tablet by mouth daily.      . Olopatadine HCl (PATADAY) 0.2 % SOLN Place 1 drop into both eyes daily.     . potassium chloride SA (K-DUR,KLOR-CON) 20 MEQ tablet TAKE ONE TABLET BY MOUTH ONE TIME DAILY 90 tablet 3  . TRAVATAN Z 0.004 % SOLN ophthalmic solution Place 1 drop into both eyes at bedtime.  3  . warfarin (COUMADIN) 2 MG tablet TAKE 1 AND 1/2 TO 2 TABLETS BY MOUTH DAILY AS DIRECTED BY COUMADIN CLINIC (Patient taking differently: Take 1.5 tablets everyday except Wednesday take 2 tablets.) 180 tablet 0   No current  facility-administered medications for this visit.    No Known Allergies  Social History   Social History  . Marital Status: Widowed    Spouse Name: N/A  . Number of Children: N/A  . Years of Education: N/A   Occupational History  .     Social History Main Topics  . Smoking status: Never Smoker   . Smokeless tobacco: Never Used  . Alcohol Use: No  . Drug Use: No  . Sexual Activity: No   Other Topics Concern  . Not on file   Social History Narrative     Review of Systems: General: negative for chills, fever, night sweats or weight changes.  Cardiovascular: negative for chest pain, dyspnea on exertion, edema, orthopnea, palpitations, paroxysmal nocturnal dyspnea or shortness of breath Dermatological: negative for rash Respiratory: negative for cough or wheezing Urologic: negative for hematuria Abdominal: negative for nausea, vomiting, diarrhea, bright red blood per rectum, melena, or hematemesis Neurologic: negative for visual changes, syncope, or dizziness All other systems reviewed and are otherwise negative except as noted above.    Blood pressure 118/80, pulse 81, height 5\' 5"  (1.651 m), weight 139 lb (63.05 kg).  General appearance: alert, cooperative, appears stated age and no distress Lungs: clear to auscultation bilaterally and kyphosis Heart: regular rate and rhythm Extremities: no edema Neurologic: Grossly normal  EKG A paced  ASSESSMENT AND PLAN:   Atrial  fibrillation with RVR ([place]) Admitted 12/15/15 with AF with RVR- Amiodarone added Decrease to 200 mg today  Essential hypertension B/P controlled  Chronic anticoagulation Check INR today since Amiodarone added  Pacemaker- Medtronic DOI 1/13 A-paced today  Cardiomyopathy EF 20% by echo [facility]) Presumed ischemic- compensated today  Chronic renal insufficiency, stage III (moderate) Last GFR 41   PLAN  Coumadin adjusted, f/u INR as well as a BMP next week. Decrease Amiodarone to 200 mg daily.  Keep f/u with [facility] in May.   Kerin Ransom K PA-C 12/24/2015 8:54 AM

## 2015-12-24 NOTE — Assessment & Plan Note (Signed)
Check INR today since Amiodarone added

## 2015-12-24 NOTE — Assessment & Plan Note (Signed)
B/P controlled 

## 2016-01-08 ENCOUNTER — Ambulatory Visit (INDEPENDENT_AMBULATORY_CARE_PROVIDER_SITE_OTHER): Payer: Medicare Other | Admitting: Surgery

## 2016-01-08 DIAGNOSIS — I4891 Unspecified atrial fibrillation: Secondary | ICD-10-CM

## 2016-01-08 DIAGNOSIS — Z5181 Encounter for therapeutic drug level monitoring: Secondary | ICD-10-CM

## 2016-01-08 LAB — POCT INR: INR: 4.3

## 2016-01-13 NOTE — Progress Notes (Signed)
HPI The patient presents after followup of had tachybradycardia syndrome.  Since I last saw her she has done well.  The patient denies any new symptoms such as chest discomfort, neck or arm discomfort. There has been no new shortness of breath, PND or orthopnea. There have been no reported palpitations, presyncope or syncope.   She goes to church and shopping and out to dinner.   Saw in in Feb.  She went into atrial fib in April.  She went to the hospital. I reviewed these hospital records. She was hospitalized and after IV amiodarone went back into sinus rhythm. She was discharged on amiodarone.  Since then she has been seen in the office and had her amio dose reduced.  The patient denies any new symptoms such as chest discomfort, neck or arm discomfort. There has been no new shortness of breath, PND or orthopnea. There have been no reported palpitations, presyncope or syncope.  She is not feeling any of the symptoms that she had with her fibrillation.     No Known Allergies  Current Outpatient Prescriptions  Medication Sig Dispense Refill  . amiodarone (PACERONE) 400 MG tablet Take 0.5 tablets (200 mg total) by mouth daily. 30 tablet 3  . atorvastatin (LIPITOR) 10 MG tablet TAKE 1 TABLET BY MOUTH AT BEDTIME 90 tablet 2  . furosemide (LASIX) 80 MG tablet TAKE ONE TABLET BY MOUTH EVERY DAY 90 tablet 3  . lisinopril (PRINIVIL,ZESTRIL) 5 MG tablet TAKE 1 TABLET(5 MG) BY MOUTH DAILY 90 tablet 3  . metoprolol (LOPRESSOR) 50 MG tablet Take 1.5 tablets (75 mg total) by mouth 2 (two) times daily. 90 tablet 6  . Multiple Vitamin (MULTIVITAMIN) tablet Take 1 tablet by mouth daily.      . Olopatadine HCl (PATADAY) 0.2 % SOLN Place 1 drop into both eyes daily.     . potassium chloride SA (K-DUR,KLOR-CON) 20 MEQ tablet TAKE ONE TABLET BY MOUTH ONE TIME DAILY 90 tablet 3  . TRAVATAN Z 0.004 % SOLN ophthalmic solution Place 1 drop into both eyes at bedtime.  3  . warfarin (COUMADIN) 2 MG tablet TAKE 1 AND  1/2 TO 2 TABLETS BY MOUTH DAILY AS DIRECTED BY COUMADIN CLINIC (Patient taking differently: Take 1.5 tablets everyday except Wednesday take 2 tablets.) 180 tablet 0   No current facility-administered medications for this visit.    Past Medical History  Diagnosis Date  . Malleolar fracture   . Hypertension   . Dementia     mild  . Dyslipidemia   . Glaucoma   . Anemia   . Cardiomyopathy     presumed ischemic in the past with an EF of 35%. The most recent EF in 03-2008, however, was 55 -60%  . Chronic renal insufficiency   . Bradycardia   . Paroxysmal atrial fibrillation (H[facility])   . Arthritis     "knees especially"    Past Surgical History  Procedure Laterality Date  . Hemorrhoid surgery    . Insert / replace / remove pacemaker  09/17/11    initial placement  . Insert / replace / remove pacemaker  09/18/11  . Abdominal hysterectomy    . Tonsillectomy and adenoidectomy    . Fracture surgery  ~ 2007    RLE  . Cardioversion  07/2011  . Eye surgery  2002    "blood clot behind eye"  . Permanent pacemaker insertion N/A 09/17/2011    Procedure: PERMANENT PACEMAKER INSERTION;  Surgeon: Deboraha Sprang, MD;  Location:  [facility] CATH LAB;  Service: Cardiovascular;  Laterality: N/A;  . Pacemaker revision N/A 09/18/2011    Procedure: PACEMAKER REVISION;  Surgeon: Thompson Grayer, MD;  Location: [facility] CATH LAB;  Service: Cardiovascular;  Laterality: N/A;    ROS: As stated in the HPI and negative for all other systems.  PHYSICAL EXAM BP 156/86 mmHg  Pulse 87  Ht 5' (1.524 m)  Wt 137 lb (62.143 kg)  BMI 26.76 kg/m2 GEN:  No distress NECK:  No jugular venous distention at 45 degrees, waveform within normal limits, carotid upstroke brisk and symmetric, no bruits, no thyromegaly LUNGS:  Clear to auscultation bilaterally BACK:  No CVA tenderness CHEST:  Well healed pacemaker pocket. HEART:  S1 and S2 within normal limits, no S3, no clicks, no rubs, soft apical systolic murmur ABD:  Positive bowel sounds  normal in frequency in pitch, no bruits, no rebound, no guarding, no midline pulsatile mass or hepatomeally EXT:  2 plus pulses throughout, mild edema, no cyanosis no clubbing   ASSESSMENT AND PLAN  CARDIOMYOPATHY -  She seems to be euvolemic. At this point, no change in therapy is indicated.     HYPERTENSION -  The blood pressure is mildly elevated but I repeated it and it came down.Marland Kitchen  No change in medications is indicated. We will continue with therapeutic lifestyle changes (TLC).  Tachycardia-bradycardia syndrome -  She's had no symptomatic tachyarrhythmias. She tolerates anticoagulation.  No change in therapy is planned.  She should remain on the amiodarone.  I will check a TSH and liver profile.    Dyslipidemia - She will Lipitor reduced to 5 mg daily. Lab Results  Component Value Date   CHOL 135 10/31/2015   TRIG 55 10/31/2015   HDL 96 10/31/2015   LDLCALC 28 10/31/2015

## 2016-01-14 ENCOUNTER — Ambulatory Visit (INDEPENDENT_AMBULATORY_CARE_PROVIDER_SITE_OTHER): Payer: Medicare Other | Admitting: Pharmacist

## 2016-01-14 ENCOUNTER — Encounter: Payer: Self-pay | Admitting: Cardiology

## 2016-01-14 ENCOUNTER — Ambulatory Visit (INDEPENDENT_AMBULATORY_CARE_PROVIDER_SITE_OTHER): Payer: Medicare Other | Admitting: Cardiology

## 2016-01-14 VITALS — BP 156/86 | HR 87 | Ht 60.0 in | Wt 137.0 lb

## 2016-01-14 DIAGNOSIS — E785 Hyperlipidemia, unspecified: Secondary | ICD-10-CM | POA: Diagnosis not present

## 2016-01-14 DIAGNOSIS — I4891 Unspecified atrial fibrillation: Secondary | ICD-10-CM | POA: Diagnosis not present

## 2016-01-14 DIAGNOSIS — Z79899 Other long term (current) drug therapy: Secondary | ICD-10-CM

## 2016-01-14 DIAGNOSIS — Z5181 Encounter for therapeutic drug level monitoring: Secondary | ICD-10-CM | POA: Diagnosis not present

## 2016-01-14 LAB — TSH: TSH: 2.22 mIU/L

## 2016-01-14 LAB — HEPATIC FUNCTION PANEL
ALT: 20 U/L (ref 6–29)
AST: 20 U/L (ref 10–35)
Albumin: 4.1 g/dL (ref 3.6–5.1)
Alkaline Phosphatase: 52 U/L (ref 33–130)
Bilirubin, Direct: 0.3 mg/dL — ABNORMAL HIGH (ref <=0.2)
Indirect Bilirubin: 0.9 mg/dL (ref 0.2–1.2)
Total Bilirubin: 1.2 mg/dL (ref 0.2–1.2)
Total Protein: 6.5 g/dL (ref 6.1–8.1)

## 2016-01-14 LAB — POCT INR: INR: 3.7

## 2016-01-14 NOTE — Patient Instructions (Addendum)
Medication Instructions:  Decrease Lipitor to 5 mg daily(1/2 tablet)  Labwork: TSH Liver Profile  Testing/Procedures: none  Follow-Up: 2  Months with Dr Percival Spanish  Any Other Special Instructions Will Be Listed Below (If Applicable).   If you need a refill on your cardiac medications before your next appointment, please call your pharmacy.

## 2016-01-23 ENCOUNTER — Other Ambulatory Visit: Payer: Self-pay | Admitting: Cardiology

## 2016-01-23 NOTE — Telephone Encounter (Signed)
Rx(s) sent to pharmacy electronically.  

## 2016-01-28 ENCOUNTER — Ambulatory Visit (INDEPENDENT_AMBULATORY_CARE_PROVIDER_SITE_OTHER): Payer: Medicare Other | Admitting: *Deleted

## 2016-01-28 DIAGNOSIS — Z5181 Encounter for therapeutic drug level monitoring: Secondary | ICD-10-CM

## 2016-01-28 DIAGNOSIS — I4891 Unspecified atrial fibrillation: Secondary | ICD-10-CM | POA: Diagnosis not present

## 2016-01-28 LAB — POCT INR: INR: 4.1

## 2016-02-11 ENCOUNTER — Ambulatory Visit (INDEPENDENT_AMBULATORY_CARE_PROVIDER_SITE_OTHER): Payer: Medicare Other | Admitting: *Deleted

## 2016-02-11 DIAGNOSIS — I4891 Unspecified atrial fibrillation: Secondary | ICD-10-CM | POA: Diagnosis not present

## 2016-02-11 DIAGNOSIS — Z5181 Encounter for therapeutic drug level monitoring: Secondary | ICD-10-CM

## 2016-02-11 LAB — POCT INR: INR: 3.5

## 2016-02-18 DIAGNOSIS — H40062 Primary angle closure without glaucoma damage, left eye: Secondary | ICD-10-CM | POA: Diagnosis not present

## 2016-02-25 ENCOUNTER — Encounter: Payer: Self-pay | Admitting: Internal Medicine

## 2016-02-25 ENCOUNTER — Ambulatory Visit (INDEPENDENT_AMBULATORY_CARE_PROVIDER_SITE_OTHER): Payer: Medicare Other | Admitting: Pharmacist

## 2016-02-25 ENCOUNTER — Ambulatory Visit (INDEPENDENT_AMBULATORY_CARE_PROVIDER_SITE_OTHER): Payer: Medicare Other | Admitting: Internal Medicine

## 2016-02-25 VITALS — BP 144/84 | HR 73 | Ht 60.0 in | Wt 140.0 lb

## 2016-02-25 DIAGNOSIS — I495 Sick sinus syndrome: Secondary | ICD-10-CM | POA: Diagnosis not present

## 2016-02-25 DIAGNOSIS — I4891 Unspecified atrial fibrillation: Secondary | ICD-10-CM

## 2016-02-25 DIAGNOSIS — I481 Persistent atrial fibrillation: Secondary | ICD-10-CM | POA: Diagnosis not present

## 2016-02-25 DIAGNOSIS — Z5181 Encounter for therapeutic drug level monitoring: Secondary | ICD-10-CM | POA: Diagnosis not present

## 2016-02-25 DIAGNOSIS — Z950 Presence of cardiac pacemaker: Secondary | ICD-10-CM

## 2016-02-25 DIAGNOSIS — I4819 Other persistent atrial fibrillation: Secondary | ICD-10-CM

## 2016-02-25 LAB — CUP PACEART INCLINIC DEVICE CHECK
Battery Impedance: 249 Ohm
Battery Remaining Longevity: 112 mo
Battery Voltage: 2.78 V
Brady Statistic AP VP Percent: 2 %
Brady Statistic AP VS Percent: 77 %
Brady Statistic AS VP Percent: 0 %
Brady Statistic AS VS Percent: 21 %
Date Time Interrogation Session: 20170613145100
Implantable Lead Implant Date: 20130103
Implantable Lead Implant Date: 20130103
Implantable Lead Location: 753859
Implantable Lead Location: 753860
Implantable Lead Model: 1944
Implantable Lead Model: 1948
Lead Channel Impedance Value: 402 Ohm
Lead Channel Impedance Value: 715 Ohm
Lead Channel Pacing Threshold Amplitude: 0.5 V
Lead Channel Pacing Threshold Amplitude: 0.75 V
Lead Channel Pacing Threshold Pulse Width: 0.4 ms
Lead Channel Pacing Threshold Pulse Width: 0.4 ms
Lead Channel Sensing Intrinsic Amplitude: 15.67 mV
Lead Channel Sensing Intrinsic Amplitude: 2 mV
Lead Channel Setting Pacing Amplitude: 2 V
Lead Channel Setting Pacing Amplitude: 2.5 V
Lead Channel Setting Pacing Pulse Width: 0.4 ms
Lead Channel Setting Sensing Sensitivity: 5.6 mV

## 2016-02-25 LAB — POCT INR: INR: 2.3

## 2016-02-25 NOTE — Progress Notes (Signed)
Patient Care Team: Chriss Czar, MD as PCP - General (Family Medicine)   HPI  [name] is a 82 y.o. female Seen In followup for pacemaker implanted for tachybradycardia syndrome January 0000000 complicated by atrial lead dislodgment. She had a new lead placed thereafter. She also has atrial fibrillation and is on amiodarone.   He was hospitalized with atrial fibrillation 4/17. Amiodarone was resumed  She has no CP or SOB   SHe's had no cough or nausea   Past Medical History  Diagnosis Date  . Malleolar fracture   . Hypertension   . Dementia     mild  . Dyslipidemia   . Glaucoma   . Anemia   . Cardiomyopathy     presumed ischemic in the past with an EF of 35%. The most recent EF in 03-2008, however, was 55 -60%  . Chronic renal insufficiency   . Bradycardia   . Paroxysmal atrial fibrillation (HCC)   . Arthritis     "knees especially"    Past Surgical History  Procedure Laterality Date  . Hemorrhoid surgery    . Insert / replace / remove pacemaker  09/17/11    initial placement  . Insert / replace / remove pacemaker  09/18/11  . Abdominal hysterectomy    . Tonsillectomy and adenoidectomy    . Fracture surgery  ~ 2007    RLE  . Cardioversion  07/2011  . Eye surgery  2002    "blood clot behind eye"  . Permanent pacemaker insertion N/A 09/17/2011    Procedure: PERMANENT PACEMAKER INSERTION;  Surgeon: Deboraha Sprang, MD;  Location: [facility] CATH LAB;  Service: Cardiovascular;  Laterality: N/A;  . Pacemaker revision N/A 09/18/2011    Procedure: PACEMAKER REVISION;  Surgeon: Thompson Grayer, MD;  Location: [facility] CATH LAB;  Service: Cardiovascular;  Laterality: N/A;    Current Outpatient Prescriptions  Medication Sig Dispense Refill  . amiodarone (PACERONE) 400 MG tablet Take 0.5 tablets (200 mg total) by mouth daily. 30 tablet 3  . atorvastatin (LIPITOR) 10 MG tablet Take 5 mg by mouth daily.    . furosemide (LASIX) 80 MG tablet TAKE ONE TABLET BY MOUTH EVERY DAY 90 tablet 3    . lisinopril (PRINIVIL,ZESTRIL) 5 MG tablet TAKE 1 TABLET(5 MG) BY MOUTH DAILY 90 tablet 3  . metoprolol (LOPRESSOR) 50 MG tablet Take 1.5 tablets (75 mg total) by mouth 2 (two) times daily. 90 tablet 6  . Multiple Vitamin (MULTIVITAMIN) tablet Take 1 tablet by mouth daily.      . Olopatadine HCl (PATADAY) 0.2 % SOLN Place 1 drop into both eyes daily.     . potassium chloride SA (K-DUR,KLOR-CON) 20 MEQ tablet TAKE 1 TABLET BY MOUTH EVERY DAY 90 tablet 3  . TRAVATAN Z 0.004 % SOLN ophthalmic solution Place 1 drop into both eyes at bedtime.  3  . warfarin (COUMADIN) 2 MG tablet Take 2 mg by mouth as directed.     No current facility-administered medications for this visit.    No Known Allergies  Review of Systems negative except from HPI and PMH  Physical Exam BP 144/84 mmHg  Pulse 73  Ht 5' (1.524 m)  Wt 140 lb (63.504 kg)  BMI 27.34 kg/m2 Well developed and well nourished in no acute distress HENT normal E scleral and icterus clear Neck Supple JVP flat; carotids brisk and full Clear to ausculation  Regular rate and rhythm, 2/6 systolic murmur Soft with active bowel sounds No clubbing  cyanosis  Edema Alert and oriented, grossly normal motor and sensory function Skin Warm and Dry  ECG demonstrates A pacing  Intervals-21/17/29 and 5 axis left -25 LVH with repolarization abnormalities and QRS widening  Assessment and  Plan   Atrial fibrillation   persistent  In sinus   Sinus node dysfunction  Pacemaker- Medtronic The patient's device was interrogated.  The information was reviewed. No changes were made in the programming.   Hypertension     blood pressure is normal today    We will continue her on low-dose amiodarone and check her surveillance laboratories today.  She is better

## 2016-02-25 NOTE — Patient Instructions (Signed)
Medication Instructions: - Your physician recommends that you continue on your current medications as directed. Please refer to the Current Medication list given to you today.  Labwork: - none  Procedures/Testing: - none  Follow-Up: - Remote monitoring is used to monitor your Pacemaker of ICD from home. This monitoring reduces the number of office visits required to check your device to one time per year. It allows [place] to keep an eye on the functioning of your device to ensure it is working properly. You are scheduled for a device check from home on 05/26/16. You may send your transmission at any time that day. If you have a wireless device, the transmission will be sent automatically. After your physician reviews your transmission, you will receive a postcard with your next transmission date.  - Your physician wants you to follow-up in: 1 year with Dr. Caryl Comes. You will receive a reminder letter in the mail two months in advance. If you don't receive a letter, please call our office to schedule the follow-up appointment.  Any Additional Special Instructions Will Be Listed Below (If Applicable).     If you need a refill on your cardiac medications before your next appointment, please call your pharmacy.

## 2016-03-02 ENCOUNTER — Telehealth: Payer: Self-pay | Admitting: Cardiology

## 2016-03-02 NOTE — Telephone Encounter (Signed)
New message   Daughter calling   Pt C/O medication issue:  1. Name of Medication: amiodarone   2. How are you currently taking this medication (dosage and times per day)? Bottle  200 mg -1/2 tab by mouth for 5 weeks .   3. Are you having a reaction (difficulty breathing--STAT)? No   4. What is your medication issue? Pharmacy gave only 16 tabs / need clarification on direction and dosage

## 2016-03-02 NOTE — Telephone Encounter (Signed)
Returned call to pt's daughter [facility]) and reviewed Dr. Rosezella [place] instructions for 200mg  daily. Caller states the patient only got 15 tabs last time, so she needed to clarify if dosage was correct. Patient has been taking correctly based on 200mg  daily dosage.  I clarified w/ pharmacy. Med was filled correctly, they got 15 tablets last time through use of a discount card. Rx authorized through July (pt to f/u w Dr. Percival Spanish July 24th).  Daughter aware to f/u w pharmacy today and call us if further concerns or questions.

## 2016-03-18 ENCOUNTER — Other Ambulatory Visit: Payer: Self-pay | Admitting: Cardiology

## 2016-03-18 NOTE — Telephone Encounter (Signed)
Refill Routed

## 2016-03-19 ENCOUNTER — Ambulatory Visit (INDEPENDENT_AMBULATORY_CARE_PROVIDER_SITE_OTHER): Payer: Medicare Other | Admitting: *Deleted

## 2016-03-19 DIAGNOSIS — I4891 Unspecified atrial fibrillation: Secondary | ICD-10-CM

## 2016-03-19 DIAGNOSIS — Z5181 Encounter for therapeutic drug level monitoring: Secondary | ICD-10-CM | POA: Diagnosis not present

## 2016-03-19 LAB — POCT INR: INR: 2.2

## 2016-04-05 NOTE — Progress Notes (Signed)
HPI The patient presents after followup of had tachybradycardia syndrome.  She was in atrial fib in April requiring hospitalization and IV amiodarone.  Since I last saw her she has done well.  The patient denies any new symptoms such as chest discomfort, neck or arm discomfort. There has been no new shortness of breath, PND or orthopnea. There have been no reported palpitations, presyncope or syncope.       No Known Allergies  Current Outpatient Prescriptions  Medication Sig Dispense Refill  . amiodarone (PACERONE) 400 MG tablet Take 0.5 tablets (200 mg total) by mouth daily. 30 tablet 3  . atorvastatin (LIPITOR) 10 MG tablet Take 5 mg by mouth daily.    . furosemide (LASIX) 80 MG tablet TAKE ONE TABLET BY MOUTH EVERY DAY 90 tablet 3  . lisinopril (PRINIVIL,ZESTRIL) 5 MG tablet TAKE 1 TABLET(5 MG) BY MOUTH DAILY 90 tablet 3  . metoprolol (LOPRESSOR) 50 MG tablet Take 1.5 tablets (75 mg total) by mouth 2 (two) times daily. 90 tablet 6  . Multiple Vitamin (MULTIVITAMIN) tablet Take 1 tablet by mouth daily.      . Olopatadine HCl (PATADAY) 0.2 % SOLN Place 1 drop into both eyes daily.     . potassium chloride SA (K-DUR,KLOR-CON) 20 MEQ tablet TAKE 1 TABLET BY MOUTH EVERY DAY 90 tablet 3  . TRAVATAN Z 0.004 % SOLN ophthalmic solution Place 1 drop into both eyes at bedtime.  3  . warfarin (COUMADIN) 2 MG tablet Take 2 mg by mouth as directed.    . warfarin (COUMADIN) 2 MG tablet Take 1-1.5 tablets by mouth daily as directed by coumadin clinic 120 tablet 0  . brimonidine (ALPHAGAN) 0.2 % ophthalmic solution Place 1 drop into both eyes daily.     No current facility-administered medications for this visit.     Past Medical History:  Diagnosis Date  . Anemia   . Arthritis    "knees especially"  . Bradycardia   . Cardiomyopathy    presumed ischemic in the past with an EF of 35%. The most recent EF in 03-2008, however, was 55 -60%  . Chronic renal insufficiency   . Dementia    mild  .  Dyslipidemia   . Glaucoma   . Hypertension   . Malleolar fracture   . Paroxysmal atrial fibrillation [facility])     Past Surgical History:  Procedure Laterality Date  . ABDOMINAL HYSTERECTOMY    . CARDIOVERSION  07/2011  . EYE SURGERY  2002   "blood clot behind eye"  . FRACTURE SURGERY  ~ 2007   RLE  . HEMORRHOID SURGERY    . INSERT / REPLACE / REMOVE PACEMAKER  09/17/11   initial placement  . INSERT / REPLACE / REMOVE PACEMAKER  09/18/11  . PACEMAKER REVISION N/A 09/18/2011   Procedure: PACEMAKER REVISION;  Surgeon: Thompson Grayer, MD;  Location: [facility] CATH LAB;  Service: Cardiovascular;  Laterality: N/A;  . PERMANENT PACEMAKER INSERTION N/A 09/17/2011   Procedure: PERMANENT PACEMAKER INSERTION;  Surgeon: Deboraha Sprang, MD;  Location: [facility] CATH LAB;  Service: Cardiovascular;  Laterality: N/A;  . TONSILLECTOMY AND ADENOIDECTOMY      ROS: As stated in the HPI and negative for all other systems.  PHYSICAL EXAM BP 110/70 (BP Location: Left Arm, Patient Position: Sitting, Cuff Size: Normal)   Pulse 70   Ht 5' (1.524 m)   Wt 142 lb (64.4 kg)   SpO2 99%   BMI 27.73 kg/m  GEN:  No distress  NECK:  No jugular venous distention at 45 degrees, waveform within normal limits, carotid upstroke brisk and symmetric, no bruits, no thyromegaly LUNGS:  Clear to auscultation bilaterally BACK:  No CVA tenderness CHEST:  Well healed pacemaker pocket. HEART:  S1 and S2 within normal limits, no S3, no clicks, no rubs, soft apical systolic murmur ABD:  Positive bowel sounds normal in frequency in pitch, no bruits, no rebound, no guarding, no midline pulsatile mass or hepatomeally EXT:  2 plus pulses throughout, mild edema, no cyanosis no clubbing   Lab Results  Component Value Date   TSH 2.22 01/14/2016   ALT 20 01/14/2016   AST 20 01/14/2016   ALKPHOS 52 01/14/2016   BILITOT 1.2 01/14/2016   PROT 6.5 01/14/2016   ALBUMIN 4.1 01/14/2016     ASSESSMENT AND PLAN  CARDIOMYOPATHY -  She seems to be  euvolemic. At this point, no change in therapy is indicated.     HYPERTENSION -  The blood pressure is mildly elevated but I repeated it and it came down.Marland Kitchen  No change in medications is indicated. We will continue with therapeutic lifestyle changes (TLC).  Tachycardia-bradycardia syndrome -  She's had no symptomatic tachyarrhythmias. She tolerates anticoagulation.  No change in therapy is planned.  She should remain on the amiodarone.  She is up to date as above.      Dyslipidemia - She will Lipitor reduced to 5 mg daily. Lab Results  Component Value Date   CHOL 135 10/31/2015   TRIG 55 10/31/2015   HDL 96 10/31/2015   LDLCALC 28 10/31/2015

## 2016-04-06 ENCOUNTER — Encounter: Payer: Self-pay | Admitting: Cardiology

## 2016-04-06 ENCOUNTER — Ambulatory Visit (INDEPENDENT_AMBULATORY_CARE_PROVIDER_SITE_OTHER): Payer: Medicare Other | Admitting: Pharmacist

## 2016-04-06 ENCOUNTER — Ambulatory Visit (INDEPENDENT_AMBULATORY_CARE_PROVIDER_SITE_OTHER): Payer: Medicare Other | Admitting: Cardiology

## 2016-04-06 VITALS — BP 110/70 | HR 70 | Ht 60.0 in | Wt 142.0 lb

## 2016-04-06 DIAGNOSIS — I4891 Unspecified atrial fibrillation: Secondary | ICD-10-CM | POA: Diagnosis not present

## 2016-04-06 DIAGNOSIS — I481 Persistent atrial fibrillation: Secondary | ICD-10-CM | POA: Diagnosis not present

## 2016-04-06 DIAGNOSIS — I495 Sick sinus syndrome: Secondary | ICD-10-CM

## 2016-04-06 DIAGNOSIS — I4819 Other persistent atrial fibrillation: Secondary | ICD-10-CM

## 2016-04-06 DIAGNOSIS — Z5181 Encounter for therapeutic drug level monitoring: Secondary | ICD-10-CM

## 2016-04-06 LAB — POCT INR: INR: 2

## 2016-04-06 NOTE — Patient Instructions (Signed)
Your physician recommends that you schedule a follow-up appointment in: 4 Months  

## 2016-04-22 ENCOUNTER — Other Ambulatory Visit: Payer: Self-pay | Admitting: Nurse Practitioner

## 2016-05-05 ENCOUNTER — Ambulatory Visit (INDEPENDENT_AMBULATORY_CARE_PROVIDER_SITE_OTHER): Payer: Medicare Other | Admitting: Pharmacist

## 2016-05-05 DIAGNOSIS — Z5181 Encounter for therapeutic drug level monitoring: Secondary | ICD-10-CM

## 2016-05-05 DIAGNOSIS — I4891 Unspecified atrial fibrillation: Secondary | ICD-10-CM | POA: Diagnosis not present

## 2016-05-05 LAB — POCT INR: INR: 2.8

## 2016-05-14 ENCOUNTER — Other Ambulatory Visit: Payer: Self-pay | Admitting: Cardiology

## 2016-05-14 MED ORDER — AMIODARONE HCL 400 MG PO TABS: 200.0000 mg | ORAL_TABLET | Freq: Every day | ORAL | 11 refills | Status: DC

## 2016-05-14 NOTE — Telephone Encounter (Signed)
Rx request sent to pharmacy.  

## 2016-05-15 ENCOUNTER — Other Ambulatory Visit: Payer: Self-pay | Admitting: Cardiology

## 2016-05-15 NOTE — Telephone Encounter (Signed)
Patient states [facility] has not heard from her refill request for Amiodarone 400 mg--.5 tab daily.

## 2016-05-19 ENCOUNTER — Other Ambulatory Visit: Payer: Self-pay | Admitting: Cardiology

## 2016-05-19 MED ORDER — AMIODARONE HCL 400 MG PO TABS: 200.0000 mg | ORAL_TABLET | Freq: Every day | ORAL | 11 refills | Status: DC

## 2016-05-19 NOTE — Telephone Encounter (Signed)
amiodarone (PACERONE) 400 MG tablet 30 tablet 11 05/19/2016    Sig - Route: Take 0.5 tablets (200 mg total) by mouth daily. - Oral   E-Prescribing Status: Receipt confirmed by pharmacy (05/19/2016 3:30 PM EDT)   [facility] [id number] - [place], [place]. ([address]

## 2016-05-19 NOTE — Telephone Encounter (Signed)
Rx request sent to pharmacy.  

## 2016-05-26 ENCOUNTER — Ambulatory Visit (INDEPENDENT_AMBULATORY_CARE_PROVIDER_SITE_OTHER): Payer: Medicare Other | Admitting: *Deleted

## 2016-05-26 DIAGNOSIS — I495 Sick sinus syndrome: Secondary | ICD-10-CM

## 2016-05-26 NOTE — Progress Notes (Signed)
Remote pacemaker transmission.   

## 2016-05-28 ENCOUNTER — Encounter: Payer: Self-pay | Admitting: Cardiology

## 2016-06-02 ENCOUNTER — Ambulatory Visit (INDEPENDENT_AMBULATORY_CARE_PROVIDER_SITE_OTHER): Payer: Medicare Other | Admitting: *Deleted

## 2016-06-02 DIAGNOSIS — Z5181 Encounter for therapeutic drug level monitoring: Secondary | ICD-10-CM | POA: Diagnosis not present

## 2016-06-02 DIAGNOSIS — I4891 Unspecified atrial fibrillation: Secondary | ICD-10-CM | POA: Diagnosis not present

## 2016-06-02 LAB — POCT INR: INR: 2.4

## 2016-06-04 DIAGNOSIS — Z23 Encounter for immunization: Secondary | ICD-10-CM | POA: Diagnosis not present

## 2016-06-04 LAB — CUP PACEART REMOTE DEVICE CHECK
Battery Impedance: 249 Ohm
Battery Remaining Longevity: 109 mo
Battery Voltage: 2.78 V
Brady Statistic AP VP Percent: 0 %
Brady Statistic AP VS Percent: 99 %
Brady Statistic AS VP Percent: 0 %
Brady Statistic AS VS Percent: 1 %
Date Time Interrogation Session: 20170910181506
Implantable Lead Implant Date: 20130103
Implantable Lead Implant Date: 20130103
Implantable Lead Location: 753859
Implantable Lead Location: 753860
Implantable Lead Model: 1944
Implantable Lead Model: 1948
Lead Channel Impedance Value: 391 Ohm
Lead Channel Impedance Value: 783 Ohm
Lead Channel Pacing Threshold Amplitude: 0.25 V
Lead Channel Pacing Threshold Amplitude: 0.625 V
Lead Channel Pacing Threshold Pulse Width: 0.4 ms
Lead Channel Pacing Threshold Pulse Width: 0.4 ms
Lead Channel Sensing Intrinsic Amplitude: 11.2 mV
Lead Channel Setting Pacing Amplitude: 2 V
Lead Channel Setting Pacing Amplitude: 2.5 V
Lead Channel Setting Pacing Pulse Width: 0.4 ms
Lead Channel Setting Sensing Sensitivity: 5.6 mV

## 2016-06-11 ENCOUNTER — Encounter: Payer: Self-pay | Admitting: Cardiology

## 2016-06-30 ENCOUNTER — Ambulatory Visit (INDEPENDENT_AMBULATORY_CARE_PROVIDER_SITE_OTHER): Payer: Medicare Other | Admitting: Pharmacist Clinician (PhC)/ Clinical Pharmacy Specialist

## 2016-06-30 DIAGNOSIS — I4891 Unspecified atrial fibrillation: Secondary | ICD-10-CM | POA: Diagnosis not present

## 2016-06-30 DIAGNOSIS — Z5181 Encounter for therapeutic drug level monitoring: Secondary | ICD-10-CM

## 2016-06-30 LAB — POCT INR: INR: 3.5

## 2016-07-14 ENCOUNTER — Ambulatory Visit (INDEPENDENT_AMBULATORY_CARE_PROVIDER_SITE_OTHER): Payer: Medicare Other | Admitting: Pharmacist Clinician (PhC)/ Clinical Pharmacy Specialist

## 2016-07-14 DIAGNOSIS — I4891 Unspecified atrial fibrillation: Secondary | ICD-10-CM | POA: Diagnosis not present

## 2016-07-14 DIAGNOSIS — Z5181 Encounter for therapeutic drug level monitoring: Secondary | ICD-10-CM

## 2016-07-14 LAB — POCT INR: INR: 2.1

## 2016-07-15 ENCOUNTER — Other Ambulatory Visit: Payer: Self-pay | Admitting: *Deleted

## 2016-07-15 MED ORDER — WARFARIN SODIUM 2 MG PO TABS: ORAL_TABLET | ORAL | 1 refills | Status: DC

## 2016-07-15 MED ORDER — FUROSEMIDE 80 MG PO TABS: 80.0000 mg | ORAL_TABLET | Freq: Every day | ORAL | 1 refills | Status: DC

## 2016-07-15 MED ORDER — AMIODARONE HCL 400 MG PO TABS: 200.0000 mg | ORAL_TABLET | Freq: Every day | ORAL | 1 refills | Status: DC

## 2016-07-26 NOTE — Progress Notes (Signed)
HPI The patient presents after followup of had tachybradycardia syndrome.  She was in atrial fib in April requiring hospitalization and IV amiodarone.  Since then she has done well.   The patient denies any new symptoms such as chest discomfort, neck or arm discomfort. There has been no new shortness of breath, PND or orthopnea. There have been no reported palpitations, presyncope or syncope.       No Known Allergies  Current Outpatient Prescriptions  Medication Sig Dispense Refill  . amiodarone (PACERONE) 400 MG tablet Take 0.5 tablets (200 mg total) by mouth daily. 45 tablet 1  . atorvastatin (LIPITOR) 10 MG tablet Take 5 mg by mouth daily.    . brimonidine (ALPHAGAN) 0.2 % ophthalmic solution Place 1 drop into both eyes daily.    . furosemide (LASIX) 80 MG tablet Take 1 tablet (80 mg total) by mouth daily. 90 tablet 1  . lisinopril (PRINIVIL,ZESTRIL) 5 MG tablet TAKE 1 TABLET(5 MG) BY MOUTH DAILY 90 tablet 3  . metoprolol (LOPRESSOR) 50 MG tablet TAKE 1 AND 1/2 TABLETS(75 MG) BY MOUTH TWICE DAILY 90 tablet 11  . Multiple Vitamin (MULTIVITAMIN) tablet Take 1 tablet by mouth daily.      . Olopatadine HCl (PATADAY) 0.2 % SOLN Place 1 drop into both eyes daily.     . potassium chloride SA (K-DUR,KLOR-CON) 20 MEQ tablet TAKE 1 TABLET BY MOUTH EVERY DAY 90 tablet 3  . TRAVATAN Z 0.004 % SOLN ophthalmic solution Place 1 drop into both eyes at bedtime.  3  . warfarin (COUMADIN) 2 MG tablet Take as directed by coumadin clinic 120 tablet 1   No current facility-administered medications for this visit.     Past Medical History:  Diagnosis Date  . Anemia   . Arthritis    "knees especially"  . Bradycardia   . Cardiomyopathy    presumed ischemic in the past with an EF of 35%. The most recent EF in 03-2008, however, was 55 -60%  . Chronic renal insufficiency   . Dementia    mild  . Dyslipidemia   . Glaucoma   . Hypertension   . Malleolar fracture   . Paroxysmal atrial fibrillation [facility])       Past Surgical History:  Procedure Laterality Date  . ABDOMINAL HYSTERECTOMY    . CARDIOVERSION  07/2011  . EYE SURGERY  2002   "blood clot behind eye"  . FRACTURE SURGERY  ~ 2007   RLE  . HEMORRHOID SURGERY    . INSERT / REPLACE / REMOVE PACEMAKER  09/17/11   initial placement  . INSERT / REPLACE / REMOVE PACEMAKER  09/18/11  . PACEMAKER REVISION N/A 09/18/2011   Procedure: PACEMAKER REVISION;  Surgeon: Thompson Grayer, MD;  Location: [facility] CATH LAB;  Service: Cardiovascular;  Laterality: N/A;  . PERMANENT PACEMAKER INSERTION N/A 09/17/2011   Procedure: PERMANENT PACEMAKER INSERTION;  Surgeon: Deboraha Sprang, MD;  Location: [facility] CATH LAB;  Service: Cardiovascular;  Laterality: N/A;  . TONSILLECTOMY AND ADENOIDECTOMY      ROS:     As stated in the HPI and negative for all other systems.  PHYSICAL EXAM BP (!) 147/86 (BP Location: Right Arm, Patient Position: Sitting, Cuff Size: Normal)   Pulse 74   Ht 5' (1.524 m)   Wt 141 lb (64 kg)   SpO2 97%   BMI 27.54 kg/m  GEN:  No distress NECK:  No jugular venous distention at 45 degrees, waveform within normal limits, carotid upstroke brisk and  symmetric, no bruits, no thyromegaly LUNGS:  Clear to auscultation bilaterally BACK:  No CVA tenderness CHEST:  Well healed pacemaker pocket. HEART:  S1 and S2 within normal limits, no S3, no clicks, no rubs, soft apical systolic murmur ABD:  Positive bowel sounds normal in frequency in pitch, no bruits, no rebound, no guarding, no midline pulsatile mass or hepatomeally EXT:  2 plus pulses throughout, mild edema, no cyanosis no clubbing   Lab Results  Component Value Date   TSH 3.52 07/27/2016   ALT 14 07/27/2016   AST 20 07/27/2016   ALKPHOS 49 07/27/2016   BILITOT 1.0 07/27/2016   PROT 6.5 07/27/2016   ALBUMIN 4.1 07/27/2016    ASSESSMENT AND PLAN  CARDIOMYOPATHY -  She seems to be euvolemic. At this point, no change in therapy is indicated.     HYPERTENSION -  The blood pressure is OK.   No change in medications is indicated. We will continue with therapeutic lifestyle changes (TLC).  Tachycardia-bradycardia syndrome -  She's had no symptomatic tachyarrhythmias. She tolerates anticoagulation.  No change in therapy is planned.  She should remain on the amiodarone.  She will have follow up labs today.   Dyslipidemia - She had her Lipitor reduced to 5 mg daily.  I will follow labs in the future.  Lab Results  Component Value Date   CHOL 135 10/31/2015   TRIG 55 10/31/2015   HDL 96 10/31/2015   LDLCALC 28 10/31/2015

## 2016-07-27 ENCOUNTER — Encounter (INDEPENDENT_AMBULATORY_CARE_PROVIDER_SITE_OTHER): Payer: Medicare Other

## 2016-07-27 ENCOUNTER — Ambulatory Visit (INDEPENDENT_AMBULATORY_CARE_PROVIDER_SITE_OTHER): Payer: Medicare Other | Admitting: Pharmacist Clinician (PhC)/ Clinical Pharmacy Specialist

## 2016-07-27 ENCOUNTER — Ambulatory Visit (INDEPENDENT_AMBULATORY_CARE_PROVIDER_SITE_OTHER): Payer: Medicare Other | Admitting: Cardiology

## 2016-07-27 VITALS — BP 147/86 | HR 74 | Ht 60.0 in | Wt 141.0 lb

## 2016-07-27 DIAGNOSIS — I495 Sick sinus syndrome: Secondary | ICD-10-CM

## 2016-07-27 DIAGNOSIS — I4891 Unspecified atrial fibrillation: Secondary | ICD-10-CM

## 2016-07-27 DIAGNOSIS — Z5181 Encounter for therapeutic drug level monitoring: Secondary | ICD-10-CM

## 2016-07-27 DIAGNOSIS — Z950 Presence of cardiac pacemaker: Secondary | ICD-10-CM

## 2016-07-27 DIAGNOSIS — Z79899 Other long term (current) drug therapy: Secondary | ICD-10-CM

## 2016-07-27 LAB — HEPATIC FUNCTION PANEL
ALT: 14 U/L (ref 6–29)
AST: 20 U/L (ref 10–35)
Albumin: 4.1 g/dL (ref 3.6–5.1)
Alkaline Phosphatase: 49 U/L (ref 33–130)
Bilirubin, Direct: 0.2 mg/dL (ref <=0.2)
Indirect Bilirubin: 0.8 mg/dL (ref 0.2–1.2)
Total Bilirubin: 1 mg/dL (ref 0.2–1.2)
Total Protein: 6.5 g/dL (ref 6.1–8.1)

## 2016-07-27 LAB — POCT INR: INR: 2.5

## 2016-07-27 NOTE — Patient Instructions (Signed)
Medication Instructions:  Continue current medications  Labwork: TSH and Liver Function  Testing/Procedures: None Ordered  Follow-Up: Your physician recommends that you schedule a follow-up appointment in: 4 Months.   Any Other Special Instructions Will Be Listed Below (If Applicable).          Happy Thanksgiving  If you need a refill on your cardiac medications before your next appointment, please call your pharmacy.

## 2016-07-28 LAB — TSH: TSH: 3.52 mIU/L

## 2016-07-29 ENCOUNTER — Encounter: Payer: Self-pay | Admitting: Cardiology

## 2016-08-11 ENCOUNTER — Other Ambulatory Visit: Payer: Self-pay | Admitting: Cardiology

## 2016-08-25 ENCOUNTER — Ambulatory Visit (INDEPENDENT_AMBULATORY_CARE_PROVIDER_SITE_OTHER): Payer: Medicare Other | Admitting: *Deleted

## 2016-08-25 ENCOUNTER — Telehealth: Payer: Self-pay | Admitting: Cardiology

## 2016-08-25 DIAGNOSIS — I495 Sick sinus syndrome: Secondary | ICD-10-CM

## 2016-08-25 DIAGNOSIS — I4891 Unspecified atrial fibrillation: Secondary | ICD-10-CM

## 2016-08-25 DIAGNOSIS — Z5181 Encounter for therapeutic drug level monitoring: Secondary | ICD-10-CM | POA: Diagnosis not present

## 2016-08-25 LAB — POCT INR: INR: 2.5

## 2016-08-25 NOTE — Telephone Encounter (Signed)
Attempted to confirm remote transmission with pt. No answer and was unable to leave a message.   

## 2016-08-25 NOTE — Progress Notes (Signed)
Remote pacemaker transmission.   

## 2016-09-02 ENCOUNTER — Encounter: Payer: Self-pay | Admitting: Cardiology

## 2016-09-08 LAB — CUP PACEART REMOTE DEVICE CHECK
Battery Impedance: 249 Ohm
Battery Remaining Longevity: 110 mo
Battery Voltage: 2.78 V
Brady Statistic AP VP Percent: 0 %
Brady Statistic AP VS Percent: 99 %
Brady Statistic AS VP Percent: 0 %
Brady Statistic AS VS Percent: 1 %
Date Time Interrogation Session: 20171212181403
Implantable Lead Implant Date: 20130103
Implantable Lead Implant Date: 20130103
Implantable Lead Location: 753859
Implantable Lead Location: 753860
Implantable Lead Model: 1944
Implantable Lead Model: 1948
Implantable Pulse Generator Implant Date: 20130103
Lead Channel Impedance Value: 406 Ohm
Lead Channel Impedance Value: 783 Ohm
Lead Channel Pacing Threshold Amplitude: 0.25 V
Lead Channel Pacing Threshold Amplitude: 0.5 V
Lead Channel Pacing Threshold Pulse Width: 0.4 ms
Lead Channel Pacing Threshold Pulse Width: 0.4 ms
Lead Channel Sensing Intrinsic Amplitude: 16 mV
Lead Channel Setting Pacing Amplitude: 2 V
Lead Channel Setting Pacing Amplitude: 2.5 V
Lead Channel Setting Pacing Pulse Width: 0.4 ms
Lead Channel Setting Sensing Sensitivity: 5.6 mV

## 2016-09-18 ENCOUNTER — Encounter: Payer: Self-pay | Admitting: Cardiology

## 2016-09-22 ENCOUNTER — Ambulatory Visit (INDEPENDENT_AMBULATORY_CARE_PROVIDER_SITE_OTHER): Payer: Medicare Other | Admitting: *Deleted

## 2016-09-22 DIAGNOSIS — I4891 Unspecified atrial fibrillation: Secondary | ICD-10-CM | POA: Diagnosis not present

## 2016-09-22 DIAGNOSIS — Z5181 Encounter for therapeutic drug level monitoring: Secondary | ICD-10-CM

## 2016-09-22 LAB — POCT INR: INR: 2.7

## 2016-09-28 DIAGNOSIS — H401134 Primary open-angle glaucoma, bilateral, indeterminate stage: Secondary | ICD-10-CM | POA: Diagnosis not present

## 2016-10-20 ENCOUNTER — Ambulatory Visit (INDEPENDENT_AMBULATORY_CARE_PROVIDER_SITE_OTHER): Payer: Medicare Other

## 2016-10-20 DIAGNOSIS — I4891 Unspecified atrial fibrillation: Secondary | ICD-10-CM

## 2016-10-20 DIAGNOSIS — Z5181 Encounter for therapeutic drug level monitoring: Secondary | ICD-10-CM

## 2016-10-20 LAB — POCT INR: INR: 3.2

## 2016-11-10 ENCOUNTER — Ambulatory Visit (INDEPENDENT_AMBULATORY_CARE_PROVIDER_SITE_OTHER): Payer: Medicare Other | Admitting: *Deleted

## 2016-11-10 DIAGNOSIS — I4891 Unspecified atrial fibrillation: Secondary | ICD-10-CM | POA: Diagnosis not present

## 2016-11-10 DIAGNOSIS — Z5181 Encounter for therapeutic drug level monitoring: Secondary | ICD-10-CM | POA: Diagnosis not present

## 2016-11-10 LAB — POCT INR: INR: 2.6

## 2016-11-19 ENCOUNTER — Encounter: Payer: Self-pay | Admitting: Cardiology

## 2016-11-24 ENCOUNTER — Ambulatory Visit (INDEPENDENT_AMBULATORY_CARE_PROVIDER_SITE_OTHER): Payer: Medicare Other | Admitting: *Deleted

## 2016-11-24 DIAGNOSIS — I495 Sick sinus syndrome: Secondary | ICD-10-CM | POA: Diagnosis not present

## 2016-11-24 NOTE — Progress Notes (Signed)
Remote pacemaker transmission.   

## 2016-11-25 ENCOUNTER — Encounter: Payer: Self-pay | Admitting: Cardiology

## 2016-11-25 NOTE — Progress Notes (Signed)
HPI The patient presents after followup of had tachybradycardia syndrome.  She was in atrial fib in April of last year requiring hospitalization and IV amiodarone.  Since then she has done well.   She returns for follow up.  Since I last saw her she has done well. She does grocery shopping and goes to church goes out to dinner. She does some mild household chores. The patient denies any new symptoms such as chest discomfort, neck or arm discomfort. There has been no new shortness of breath, PND or orthopnea. There have been no reported palpitations, presyncope or syncope.   No Known Allergies  Current Outpatient Prescriptions  Medication Sig Dispense Refill  . amiodarone (PACERONE) 400 MG tablet Take 0.5 tablets (200 mg total) by mouth daily. 45 tablet 1  . atorvastatin (LIPITOR) 10 MG tablet Take 5 mg by mouth daily.    . brimonidine (ALPHAGAN) 0.2 % ophthalmic solution Place 1 drop into both eyes daily.    . furosemide (LASIX) 80 MG tablet Take 1 tablet (80 mg total) by mouth daily. 90 tablet 1  . lisinopril (PRINIVIL,ZESTRIL) 5 MG tablet TAKE 1 TABLET(5 MG) BY MOUTH DAILY 90 tablet 3  . metoprolol (LOPRESSOR) 50 MG tablet TAKE 1 AND 1/2 TABLETS(75 MG) BY MOUTH TWICE DAILY 90 tablet 11  . Multiple Vitamin (MULTIVITAMIN) tablet Take 1 tablet by mouth daily.      . Olopatadine HCl (PATADAY) 0.2 % SOLN Place 1 drop into both eyes daily.     . potassium chloride SA (K-DUR,KLOR-CON) 20 MEQ tablet TAKE 1 TABLET BY MOUTH EVERY DAY 90 tablet 3  . TRAVATAN Z 0.004 % SOLN ophthalmic solution Place 1 drop into both eyes at bedtime.  3  . warfarin (COUMADIN) 2 MG tablet Take as directed by coumadin clinic 120 tablet 1   No current facility-administered medications for this visit.     Past Medical History:  Diagnosis Date  . Anemia   . Arthritis    "knees especially"  . Bradycardia   . Cardiomyopathy    presumed ischemic in the past with an EF of 35%. The most recent EF in 03-2008, however,  was 55 -60%  . Chronic renal insufficiency   . Dementia    mild  . Dyslipidemia   . Glaucoma   . Hypertension   . Malleolar fracture   . Paroxysmal atrial fibrillation [facility]     Past Surgical History:  Procedure Laterality Date  . ABDOMINAL HYSTERECTOMY    . CARDIOVERSION  07/2011  . EYE SURGERY  2002   "blood clot behind eye"  . FRACTURE SURGERY  ~ 2007   RLE  . HEMORRHOID SURGERY    . INSERT / REPLACE / REMOVE PACEMAKER  09/17/11   initial placement  . INSERT / REPLACE / REMOVE PACEMAKER  09/18/11  . PACEMAKER REVISION N/A 09/18/2011   Procedure: PACEMAKER REVISION;  Surgeon: Thompson Grayer, MD;  Location: [facility] CATH LAB;  Service: Cardiovascular;  Laterality: N/A;  . PERMANENT PACEMAKER INSERTION N/A 09/17/2011   Procedure: PERMANENT PACEMAKER INSERTION;  Surgeon: Deboraha Sprang, MD;  Location: [facility] CATH LAB;  Service: Cardiovascular;  Laterality: N/A;  . TONSILLECTOMY AND ADENOIDECTOMY      ROS:      As stated in the HPI and negative for all other systems.  PHYSICAL EXAM BP 138/80 (BP Location: Left Arm, Patient Position: Sitting, Cuff Size: Normal)   Pulse 78   Ht 5' (1.524 m)   Wt 142 lb (64.4  kg)   BMI 27.73 kg/m  GEN:  No distress NECK:  No jugular venous distention at 45 degrees, waveform within normal limits, carotid upstroke brisk and symmetric, no bruits, no thyromegaly LUNGS:  Clear to auscultation bilaterally BACK:  No CVA tenderness CHEST:  Well healed pacemaker pocket. HEART:  S1 and S2 within normal limits, no S3, no clicks, no rubs, soft apical systolic murmur ABD:  Positive bowel sounds normal in frequency in pitch, no bruits, no rebound, no guarding, no midline pulsatile mass or hepatomeally EXT:  2 plus pulses throughout, mild edema, no cyanosis no clubbing  EKG:  Atrial paced rhythm, rate 78, left bundle branch block  11/26/2016   Lab Results  Component Value Date   TSH 3.52 07/27/2016   ALT 14 07/27/2016   AST 20 07/27/2016   ALKPHOS 49 07/27/2016    BILITOT 1.0 07/27/2016   PROT 6.5 07/27/2016   ALBUMIN 4.1 07/27/2016    ASSESSMENT AND PLAN  CARDIOMYOPATHY -  She seems to be euvolemic. At this point, no change in therapy is indicated.     HYPERTENSION -  The blood pressure is OK.  No change in medications is indicated. We will continue with therapeutic lifestyle changes (TLC).    Tachycardia-bradycardia syndrome -  She's had no symptomatic tachyarrhythmias.   She had a pacemaker check a couple days ago but the results aren't in. She's up-to-date with this.  She's up-to-date with labs on her amiodarone.  Dyslipidemia - She needs follow up lipids when she comes back.  Lab Results  Component Value Date   CHOL 135 10/31/2015   TRIG 55 10/31/2015   HDL 96 10/31/2015   LDLCALC 28 10/31/2015

## 2016-11-26 ENCOUNTER — Ambulatory Visit (INDEPENDENT_AMBULATORY_CARE_PROVIDER_SITE_OTHER): Payer: Medicare Other | Admitting: Cardiology

## 2016-11-26 ENCOUNTER — Encounter: Payer: Self-pay | Admitting: Cardiology

## 2016-11-26 VITALS — BP 138/80 | HR 78 | Ht 60.0 in | Wt 142.0 lb

## 2016-11-26 DIAGNOSIS — I495 Sick sinus syndrome: Secondary | ICD-10-CM

## 2016-11-26 DIAGNOSIS — I10 Essential (primary) hypertension: Secondary | ICD-10-CM

## 2016-11-26 DIAGNOSIS — I420 Dilated cardiomyopathy: Secondary | ICD-10-CM | POA: Diagnosis not present

## 2016-11-26 DIAGNOSIS — I481 Persistent atrial fibrillation: Secondary | ICD-10-CM

## 2016-11-26 DIAGNOSIS — I4819 Other persistent atrial fibrillation: Secondary | ICD-10-CM

## 2016-11-26 MED ORDER — AMIODARONE HCL 200 MG PO TABS: 200.0000 mg | ORAL_TABLET | Freq: Every day | ORAL | 3 refills | Status: DC

## 2016-11-26 NOTE — Patient Instructions (Signed)
Medication Instructions:  Continue current medications  Labwork: None Ordered  Testing/Procedures: None Ordered  Follow-Up: Your physician recommends that you schedule a follow-up appointment in: 4 Months   Any Other Special Instructions Will Be Listed Below (If Applicable).   If you need a refill on your cardiac medications before your next appointment, please call your pharmacy.   

## 2016-11-27 LAB — CUP PACEART REMOTE DEVICE CHECK
Battery Impedance: 249 Ohm
Battery Remaining Longevity: 108 mo
Battery Voltage: 2.78 V
Brady Statistic AP VP Percent: 0.2 %
Brady Statistic AP VS Percent: 99.3 %
Brady Statistic AS VP Percent: 0.1 % — CL
Brady Statistic AS VS Percent: 0.5 %
Date Time Interrogation Session: 20180316102733
Implantable Lead Implant Date: 20130103
Implantable Lead Implant Date: 20130103
Implantable Lead Location: 753859
Implantable Lead Location: 753860
Implantable Lead Model: 1944
Implantable Lead Model: 1948
Implantable Pulse Generator Implant Date: 20130103
Lead Channel Impedance Value: 411 Ohm
Lead Channel Impedance Value: 781 Ohm
Lead Channel Pacing Threshold Amplitude: 0.25 V
Lead Channel Pacing Threshold Amplitude: 0.625 V
Lead Channel Pacing Threshold Pulse Width: 0.4 ms
Lead Channel Pacing Threshold Pulse Width: 0.4 ms
Lead Channel Sensing Intrinsic Amplitude: 16 mV
Lead Channel Setting Pacing Amplitude: 2 V
Lead Channel Setting Pacing Amplitude: 2.5 V
Lead Channel Setting Pacing Pulse Width: 0.4 ms
Lead Channel Setting Sensing Sensitivity: 5.6 mV

## 2016-12-08 ENCOUNTER — Ambulatory Visit (INDEPENDENT_AMBULATORY_CARE_PROVIDER_SITE_OTHER): Payer: Medicare Other | Admitting: *Deleted

## 2016-12-08 DIAGNOSIS — Z5181 Encounter for therapeutic drug level monitoring: Secondary | ICD-10-CM

## 2016-12-08 DIAGNOSIS — I4891 Unspecified atrial fibrillation: Secondary | ICD-10-CM | POA: Diagnosis not present

## 2016-12-08 LAB — POCT INR: INR: 3.1

## 2016-12-10 ENCOUNTER — Encounter: Payer: Self-pay | Admitting: Cardiology

## 2016-12-10 NOTE — Progress Notes (Signed)
Letter  

## 2016-12-15 DIAGNOSIS — H401132 Primary open-angle glaucoma, bilateral, moderate stage: Secondary | ICD-10-CM | POA: Diagnosis not present

## 2016-12-18 IMAGING — CR DG CHEST 1V PORT
1 series · 1 of 1 positions shown · non-contrast
Comparison: September 19, 2011.

CLINICAL DATA: Atrial fibrillation.

EXAM:
PORTABLE CHEST 1 VIEW

[AP]
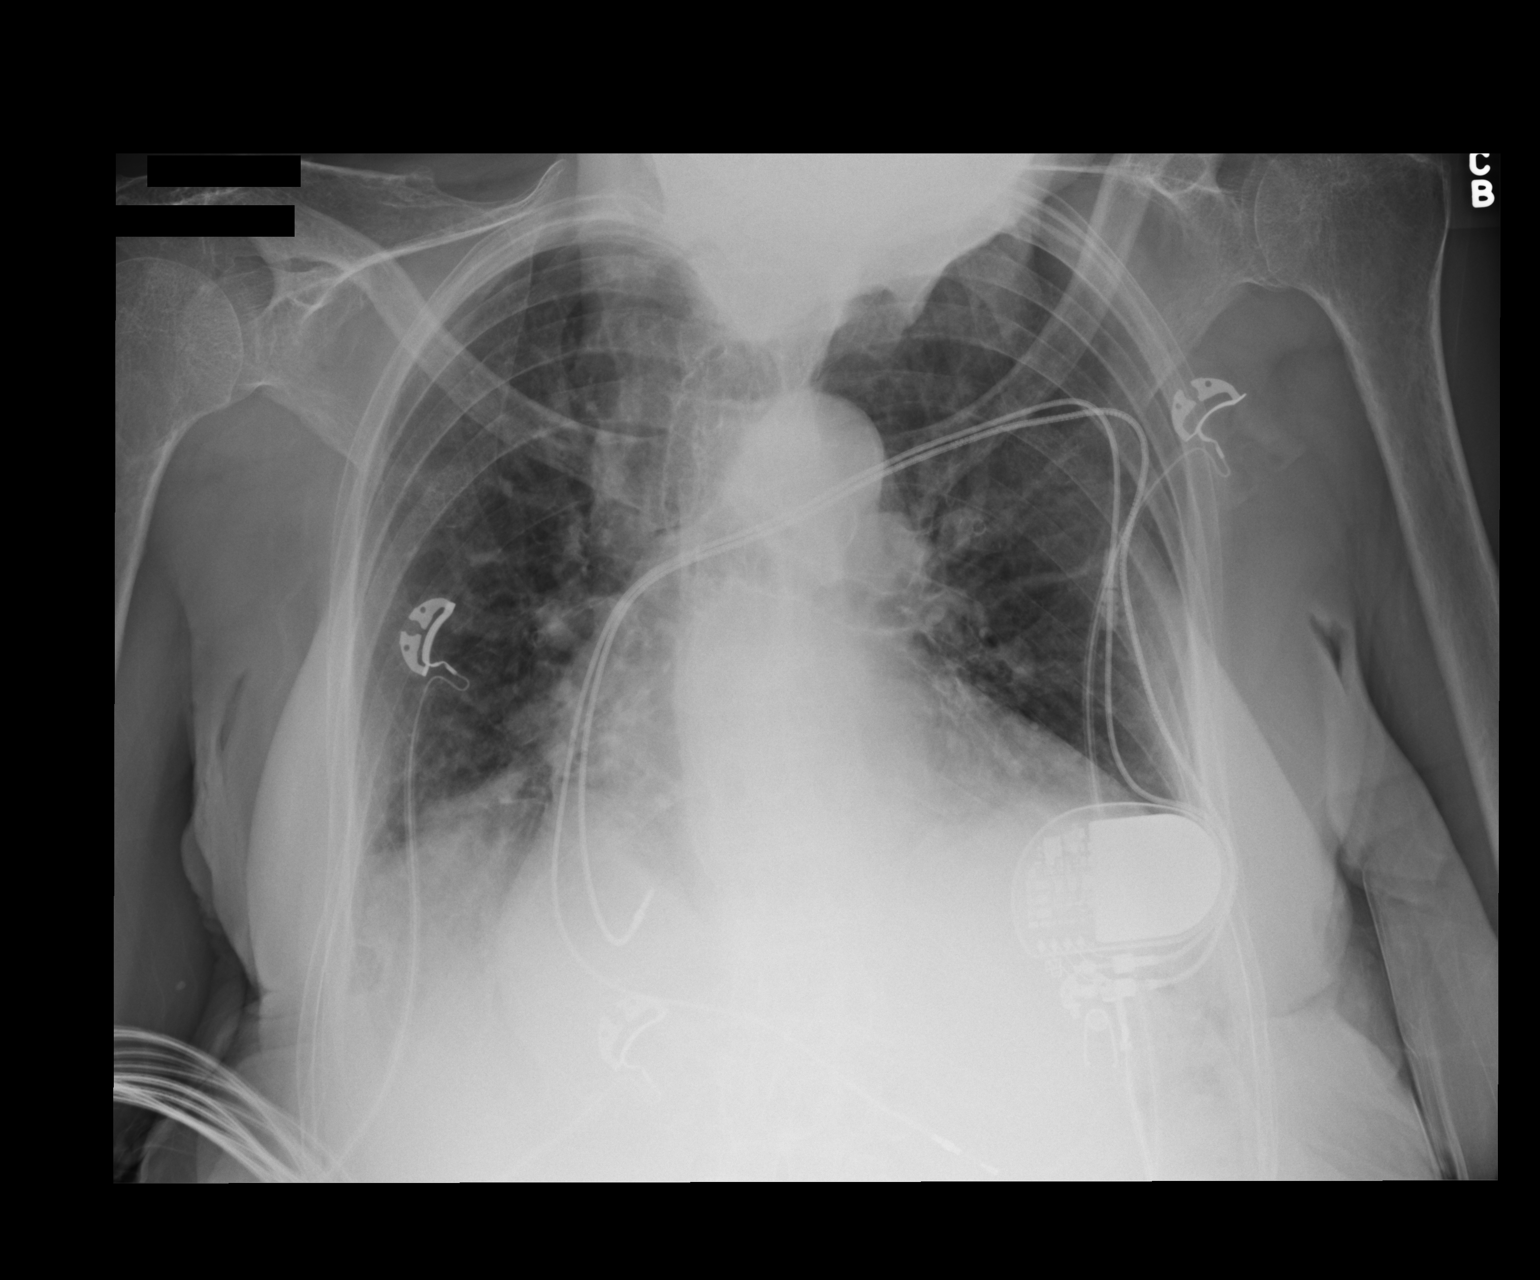

[1 of 1 positions shown; findings below may reference images not displayed]

FINDINGS: Stable cardiomegaly. No pneumothorax is noted. Mild bibasilar
opacities are noted most consistent with subsegmental atelectasis.
Left-sided pacemaker is unchanged in position. Bony thorax is
unremarkable.
IMPRESSION: Mild bibasilar subsegmental atelectasis.  Stable cardiomegaly.

## 2016-12-29 ENCOUNTER — Ambulatory Visit (INDEPENDENT_AMBULATORY_CARE_PROVIDER_SITE_OTHER): Payer: Medicare Other | Admitting: Pharmacist

## 2016-12-29 DIAGNOSIS — I4891 Unspecified atrial fibrillation: Secondary | ICD-10-CM | POA: Diagnosis not present

## 2016-12-29 DIAGNOSIS — Z5181 Encounter for therapeutic drug level monitoring: Secondary | ICD-10-CM | POA: Diagnosis not present

## 2016-12-29 LAB — POCT INR: INR: 4

## 2017-01-08 ENCOUNTER — Other Ambulatory Visit: Payer: Self-pay | Admitting: Cardiology

## 2017-01-08 MED ORDER — FUROSEMIDE 80 MG PO TABS: 80.0000 mg | ORAL_TABLET | Freq: Every day | ORAL | 1 refills | Status: DC

## 2017-01-08 NOTE — Telephone Encounter (Signed)
°*  STAT* If patient is at the pharmacy, call can be transferred to refill team.   1. Which medications need to be refilled? (please list name of each medication and dose if known) Furosemide  2. Which pharmacy/location (including street and city if local pharmacy) is medication to be sent to?[phone]  [phone]. Do they need a 30 day or 90 day supply? 90 and refills

## 2017-01-12 ENCOUNTER — Ambulatory Visit (INDEPENDENT_AMBULATORY_CARE_PROVIDER_SITE_OTHER): Payer: Medicare Other | Admitting: Pharmacist

## 2017-01-12 DIAGNOSIS — I4891 Unspecified atrial fibrillation: Secondary | ICD-10-CM | POA: Diagnosis not present

## 2017-01-12 DIAGNOSIS — Z5181 Encounter for therapeutic drug level monitoring: Secondary | ICD-10-CM | POA: Diagnosis not present

## 2017-01-12 LAB — POCT INR: INR: 3.1

## 2017-02-02 ENCOUNTER — Ambulatory Visit (INDEPENDENT_AMBULATORY_CARE_PROVIDER_SITE_OTHER): Payer: Medicare Other | Admitting: *Deleted

## 2017-02-02 DIAGNOSIS — Z5181 Encounter for therapeutic drug level monitoring: Secondary | ICD-10-CM | POA: Diagnosis not present

## 2017-02-02 DIAGNOSIS — I4891 Unspecified atrial fibrillation: Secondary | ICD-10-CM

## 2017-02-02 LAB — POCT INR: INR: 3.6

## 2017-02-16 ENCOUNTER — Ambulatory Visit (INDEPENDENT_AMBULATORY_CARE_PROVIDER_SITE_OTHER): Payer: Medicare Other

## 2017-02-16 DIAGNOSIS — I4891 Unspecified atrial fibrillation: Secondary | ICD-10-CM

## 2017-02-16 DIAGNOSIS — Z5181 Encounter for therapeutic drug level monitoring: Secondary | ICD-10-CM | POA: Diagnosis not present

## 2017-02-16 LAB — POCT INR: INR: 2.8

## 2017-02-17 ENCOUNTER — Other Ambulatory Visit: Payer: Self-pay | Admitting: Cardiology

## 2017-03-02 ENCOUNTER — Other Ambulatory Visit: Payer: Self-pay | Admitting: Cardiology

## 2017-03-03 NOTE — Telephone Encounter (Signed)
Rx has been sent to the pharmacy electronically. ° °

## 2017-03-04 ENCOUNTER — Other Ambulatory Visit: Payer: Self-pay | Admitting: *Deleted

## 2017-03-04 MED ORDER — WARFARIN SODIUM 2 MG PO TABS: ORAL_TABLET | ORAL | 0 refills | Status: DC

## 2017-03-09 ENCOUNTER — Ambulatory Visit (INDEPENDENT_AMBULATORY_CARE_PROVIDER_SITE_OTHER): Payer: Medicare Other | Admitting: *Deleted

## 2017-03-09 DIAGNOSIS — I4891 Unspecified atrial fibrillation: Secondary | ICD-10-CM

## 2017-03-09 DIAGNOSIS — Z5181 Encounter for therapeutic drug level monitoring: Secondary | ICD-10-CM

## 2017-03-09 LAB — POCT INR: INR: 2.6

## 2017-03-26 ENCOUNTER — Encounter (INDEPENDENT_AMBULATORY_CARE_PROVIDER_SITE_OTHER): Payer: Self-pay

## 2017-03-26 ENCOUNTER — Ambulatory Visit (INDEPENDENT_AMBULATORY_CARE_PROVIDER_SITE_OTHER): Payer: Medicare Other | Admitting: Internal Medicine

## 2017-03-26 VITALS — BP 114/76 | HR 81 | Ht 60.0 in | Wt 141.0 lb

## 2017-03-26 DIAGNOSIS — I480 Paroxysmal atrial fibrillation: Secondary | ICD-10-CM | POA: Diagnosis not present

## 2017-03-26 DIAGNOSIS — I495 Sick sinus syndrome: Secondary | ICD-10-CM | POA: Diagnosis not present

## 2017-03-26 DIAGNOSIS — Z950 Presence of cardiac pacemaker: Secondary | ICD-10-CM

## 2017-03-26 LAB — CBC WITH DIFFERENTIAL/PLATELET
Basophils Absolute: 0 10*3/uL (ref 0.0–0.2)
Basos: 0 %
EOS (ABSOLUTE): 0.1 10*3/uL (ref 0.0–0.4)
Eos: 1 %
Hematocrit: 33.3 % — ABNORMAL LOW (ref 34.0–46.6)
Hemoglobin: 11.1 g/dL (ref 11.1–15.9)
Immature Grans (Abs): 0 10*3/uL (ref 0.0–0.1)
Immature Granulocytes: 0 %
Lymphocytes Absolute: 1.3 10*3/uL (ref 0.7–3.1)
Lymphs: 12 %
MCH: 31.9 pg (ref 26.6–33.0)
MCHC: 33.3 g/dL (ref 31.5–35.7)
MCV: 96 fL (ref 79–97)
Monocytes Absolute: 0.6 10*3/uL (ref 0.1–0.9)
Monocytes: 6 %
Neutrophils Absolute: 8.1 10*3/uL — ABNORMAL HIGH (ref 1.4–7.0)
Neutrophils: 81 %
Platelets: 193 10*3/uL (ref 150–379)
RBC: 3.48 x10E6/uL — ABNORMAL LOW (ref 3.77–5.28)
RDW: 14.2 % (ref 12.3–15.4)
WBC: 10.1 10*3/uL (ref 3.4–10.8)

## 2017-03-26 LAB — COMPREHENSIVE METABOLIC PANEL
ALT: 34 IU/L — ABNORMAL HIGH (ref 0–32)
AST: 31 IU/L (ref 0–40)
Albumin/Globulin Ratio: 1.8 (ref 1.2–2.2)
Albumin: 4.2 g/dL (ref 3.2–4.6)
Alkaline Phosphatase: 60 IU/L (ref 39–117)
BUN/Creatinine Ratio: 20 (ref 12–28)
BUN: 29 mg/dL (ref 10–36)
Bilirubin Total: 0.9 mg/dL (ref 0.0–1.2)
CO2: 29 mmol/L (ref 20–29)
Calcium: 10 mg/dL (ref 8.7–10.3)
Chloride: 100 mmol/L (ref 96–106)
Creatinine, Ser: 1.46 mg/dL — ABNORMAL HIGH (ref 0.57–1.00)
GFR calc Af Amer: 36 mL/min/{1.73_m2} — ABNORMAL LOW (ref >59)
GFR calc non Af Amer: 31 mL/min/{1.73_m2} — ABNORMAL LOW (ref >59)
Globulin, Total: 2.3 g/dL (ref 1.5–4.5)
Glucose: 99 mg/dL (ref 65–99)
Potassium: 4.3 mmol/L (ref 3.5–5.2)
Sodium: 144 mmol/L (ref 134–144)
Total Protein: 6.5 g/dL (ref 6.0–8.5)

## 2017-03-26 LAB — CUP PACEART INCLINIC DEVICE CHECK
Battery Impedance: 296 Ohm
Battery Remaining Longevity: 104 mo
Battery Voltage: 2.78 V
Brady Statistic AP VP Percent: 0 %
Brady Statistic AP VS Percent: 99 %
Brady Statistic AS VP Percent: 0 %
Brady Statistic AS VS Percent: 0 %
Date Time Interrogation Session: 20180713115911
Implantable Lead Implant Date: 20130103
Implantable Lead Implant Date: 20130103
Implantable Lead Location: 753859
Implantable Lead Location: 753860
Implantable Lead Model: 1944
Implantable Lead Model: 1948
Implantable Pulse Generator Implant Date: 20130103
Lead Channel Impedance Value: 406 Ohm
Lead Channel Impedance Value: 704 Ohm
Lead Channel Pacing Threshold Amplitude: 0.375 V
Lead Channel Pacing Threshold Amplitude: 0.5 V
Lead Channel Pacing Threshold Amplitude: 0.5 V
Lead Channel Pacing Threshold Amplitude: 0.625 V
Lead Channel Pacing Threshold Pulse Width: 0.4 ms
Lead Channel Pacing Threshold Pulse Width: 0.4 ms
Lead Channel Pacing Threshold Pulse Width: 0.4 ms
Lead Channel Pacing Threshold Pulse Width: 0.4 ms
Lead Channel Sensing Intrinsic Amplitude: 15.67 mV
Lead Channel Setting Pacing Amplitude: 2 V
Lead Channel Setting Pacing Amplitude: 2.5 V
Lead Channel Setting Pacing Pulse Width: 0.4 ms
Lead Channel Setting Sensing Sensitivity: 5.6 mV

## 2017-03-26 LAB — TSH: TSH: 4.67 u[IU]/mL — ABNORMAL HIGH (ref 0.450–4.500)

## 2017-03-26 NOTE — Patient Instructions (Signed)
Medication Instructions: - Your physician recommends that you continue on your current medications as directed. Please refer to the Current Medication list given to you today.  Labwork: - Your physician recommends that you have lab work today: CMET/ TSH/ CBC  Procedures/Testing: - none ordered  Follow-Up: - Remote monitoring is used to monitor your Pacemaker of ICD from home. This monitoring reduces the number of office visits required to check your device to one time per year. It allows [place] to keep an eye on the functioning of your device to ensure it is working properly. You are scheduled for a device check from home on 06/28/17. You may send your transmission at any time that day. If you have a wireless device, the transmission will be sent automatically. After your physician reviews your transmission, you will receive a postcard with your next transmission date.  - Your physician wants you to follow-up in: 6 months with Tommye Standard, PA for Dr. Caryl Comes & 1 year with Dr. Caryl Comes. You will receive a reminder letter in the mail two months in advance. If you don't receive a letter, please call our office to schedule the follow-up appointment.   Any Additional Special Instructions Will Be Listed Below (If Applicable).     If you need a refill on your cardiac medications before your next appointment, please call your pharmacy.

## 2017-03-26 NOTE — Progress Notes (Signed)
Patient Care Team: Chriss Czar, MD as PCP - General (Family Medicine)   HPI  [name] is a 83 y.o. female Seen In followup for pacemaker implanted for tachybradycardia syndrome January 3295 complicated by atrial lead dislodgment. She had a new lead placed thereafter. She also has atrial fibrillation and is on amiodarone.   Date TSH LFTs CBC  11/17  3.52 26+ 12(4/17)         The patient denies chest pain, shortness of breath, nocturnal dyspnea, orthopnea or peripheral edema.  There have been no palpitations, lightheadedness or syncope.     No cough or nausea.  She colors to pass the time  Past Medical History:  Diagnosis Date  . Anemia   . Arthritis    "knees especially"  . Bradycardia   . Cardiomyopathy    presumed ischemic in the past with an EF of 35%. The most recent EF in 03-2008, however, was 55 -60%  . Chronic renal insufficiency   . Dementia    mild  . Dyslipidemia   . Glaucoma   . Hypertension   . Malleolar fracture   . Paroxysmal atrial fibrillation [facility])     Past Surgical History:  Procedure Laterality Date  . ABDOMINAL HYSTERECTOMY    . CARDIOVERSION  07/2011  . EYE SURGERY  2002   "blood clot behind eye"  . FRACTURE SURGERY  ~ 2007   RLE  . HEMORRHOID SURGERY    . INSERT / REPLACE / REMOVE PACEMAKER  09/17/11   initial placement  . INSERT / REPLACE / REMOVE PACEMAKER  09/18/11  . PACEMAKER REVISION N/A 09/18/2011   Procedure: PACEMAKER REVISION;  Surgeon: Thompson Grayer, MD;  Location: [facility] CATH LAB;  Service: Cardiovascular;  Laterality: N/A;  . PERMANENT PACEMAKER INSERTION N/A 09/17/2011   Procedure: PERMANENT PACEMAKER INSERTION;  Surgeon: Deboraha Sprang, MD;  Location: [facility] CATH LAB;  Service: Cardiovascular;  Laterality: N/A;  . TONSILLECTOMY AND ADENOIDECTOMY      Current Outpatient Prescriptions  Medication Sig Dispense Refill  . amiodarone (PACERONE) 200 MG tablet Take 1 tablet (200 mg total) by mouth daily. 90 tablet 3  .  atorvastatin (LIPITOR) 10 MG tablet Take 5 mg by mouth daily.    Marland Kitchen atorvastatin (LIPITOR) 10 MG tablet TAKE 1 TABLET BY MOUTH AT BEDTIME 90 tablet 2  . brimonidine (ALPHAGAN) 0.2 % ophthalmic solution Place 1 drop into both eyes daily.    . furosemide (LASIX) 80 MG tablet Take 1 tablet (80 mg total) by mouth daily. 90 tablet 1  . lisinopril (PRINIVIL,ZESTRIL) 5 MG tablet TAKE 1 TABLET(5 MG) BY MOUTH DAILY 90 tablet 3  . metoprolol (LOPRESSOR) 50 MG tablet TAKE 1 AND 1/2 TABLETS(75 MG) BY MOUTH TWICE DAILY 90 tablet 11  . Multiple Vitamin (MULTIVITAMIN) tablet Take 1 tablet by mouth daily.      . Olopatadine HCl (PATADAY) 0.2 % SOLN Place 1 drop into both eyes daily.     . potassium chloride SA (K-DUR,KLOR-CON) 20 MEQ tablet TAKE 1 TABLET BY MOUTH EVERY DAY 90 tablet 3  . TRAVATAN Z 0.004 % SOLN ophthalmic solution Place 1 drop into both eyes at bedtime.  3  . warfarin (COUMADIN) 2 MG tablet Take as directed by coumadin clinic 120 tablet 0   No current facility-administered medications for this visit.     No Known Allergies  Review of Systems negative except from HPI and PMH  Physical Exam BP 114/76   Pulse 81  Ht 5' (1.524 m)   Wt 141 lb (64 kg)   SpO2 97%   BMI 27.54 kg/m  Well developed and nourished in no acute distress HENT normal Neck supple with JVP-flat Clear Device pocket well healed; without hematoma or erythema.  There is no tethering  Regular rate and rhythm, no murmurs or gallops Abd-soft with active BS No Clubbing cyanosis edema Skin-warm and dry A & Oriented  Grossly normal sensory and motor function   ECG demonstrates Apacing 65 17/18/48 LBBB   Assessment and  Plan   Atrial fibrillation--persistent    Sinus node dysfunction  Pacemaker- [employer] The patient's device was interrogated.  The information was reviewed. No changes were made in the programming.   Hypertension    BP well controlled and without dizziness  On Anticoagulation;  No bleeding  issues Will check CBC  No intercurrent atrial fibrillation or flutter  Will continue amiodarone--check labs   Daughter reminds me that efforts to downtitrate before were interrupted by recurrent Afib

## 2017-03-29 ENCOUNTER — Telehealth: Payer: Self-pay

## 2017-03-29 DIAGNOSIS — Z79899 Other long term (current) drug therapy: Secondary | ICD-10-CM

## 2017-03-29 DIAGNOSIS — R7989 Other specified abnormal findings of blood chemistry: Secondary | ICD-10-CM

## 2017-03-29 NOTE — Telephone Encounter (Signed)
lmtcb for reuslts

## 2017-03-29 NOTE — Telephone Encounter (Signed)
Pt is aware and agreeable to results. Patient has agreed to come back 06/29/17 for her TSH recheck. I have scheduled lab appt and linked orders.

## 2017-03-29 NOTE — Telephone Encounter (Signed)
Follow UP   Pt calling back for results

## 2017-04-02 ENCOUNTER — Encounter: Payer: Self-pay | Admitting: *Deleted

## 2017-04-06 ENCOUNTER — Ambulatory Visit (INDEPENDENT_AMBULATORY_CARE_PROVIDER_SITE_OTHER): Payer: Medicare Other | Admitting: *Deleted

## 2017-04-06 DIAGNOSIS — Z5181 Encounter for therapeutic drug level monitoring: Secondary | ICD-10-CM

## 2017-04-06 DIAGNOSIS — I4891 Unspecified atrial fibrillation: Secondary | ICD-10-CM | POA: Diagnosis not present

## 2017-04-06 LAB — POCT INR: INR: 2.5

## 2017-04-18 NOTE — Progress Notes (Signed)
HPI The patient presents after followup of had tachybradycardia syndrome.  Since I last saw her she saw Dr. Caryl Comes.  She had a device interrogation and she has had no episodes of atrial fib.   She has done well.  The patient denies any new symptoms such as chest discomfort, neck or arm discomfort. There has been no new shortness of breath, PND or orthopnea. There have been no reported palpitations, presyncope or syncope.     No Known Allergies  Current Outpatient Prescriptions  Medication Sig Dispense Refill  . amiodarone (PACERONE) 200 MG tablet Take 1 tablet (200 mg total) by mouth daily. 90 tablet 3  . atorvastatin (LIPITOR) 10 MG tablet Take 5 mg by mouth daily.    Marland Kitchen atorvastatin (LIPITOR) 10 MG tablet TAKE 1 TABLET BY MOUTH AT BEDTIME 90 tablet 2  . brimonidine (ALPHAGAN) 0.2 % ophthalmic solution Place 1 drop into both eyes daily.    . furosemide (LASIX) 80 MG tablet Take 1 tablet (80 mg total) by mouth daily. 90 tablet 1  . lisinopril (PRINIVIL,ZESTRIL) 5 MG tablet TAKE 1 TABLET(5 MG) BY MOUTH DAILY 90 tablet 3  . metoprolol (LOPRESSOR) 50 MG tablet TAKE 1 AND 1/2 TABLETS(75 MG) BY MOUTH TWICE DAILY 90 tablet 11  . Multiple Vitamin (MULTIVITAMIN) tablet Take 1 tablet by mouth daily.      . Olopatadine HCl (PATADAY) 0.2 % SOLN Place 1 drop into both eyes daily.     . potassium chloride SA (K-DUR,KLOR-CON) 20 MEQ tablet TAKE 1 TABLET BY MOUTH EVERY DAY 90 tablet 3  . TRAVATAN Z 0.004 % SOLN ophthalmic solution Place 1 drop into both eyes at bedtime.  3  . warfarin (COUMADIN) 2 MG tablet Take as directed by coumadin clinic 120 tablet 0   No current facility-administered medications for this visit.     Past Medical History:  Diagnosis Date  . Anemia   . Arthritis    "knees especially"  . Bradycardia   . Cardiomyopathy    presumed ischemic in the past with an EF of 35%. The most recent EF in 03-2008, however, was 55 -60%  . Chronic renal insufficiency   . Dementia    mild  .  Dyslipidemia   . Glaucoma   . Hypertension   . Malleolar fracture   . Paroxysmal atrial fibrillation [facility])     Past Surgical History:  Procedure Laterality Date  . ABDOMINAL HYSTERECTOMY    . CARDIOVERSION  07/2011  . EYE SURGERY  2002   "blood clot behind eye"  . FRACTURE SURGERY  ~ 2007   RLE  . HEMORRHOID SURGERY    . INSERT / REPLACE / REMOVE PACEMAKER  09/17/11   initial placement  . INSERT / REPLACE / REMOVE PACEMAKER  09/18/11  . PACEMAKER REVISION N/A 09/18/2011   Procedure: PACEMAKER REVISION;  Surgeon: Thompson Grayer, MD;  Location: [facility] CATH LAB;  Service: Cardiovascular;  Laterality: N/A;  . PERMANENT PACEMAKER INSERTION N/A 09/17/2011   Procedure: PERMANENT PACEMAKER INSERTION;  Surgeon: Deboraha Sprang, MD;  Location: [facility] CATH LAB;  Service: Cardiovascular;  Laterality: N/A;  . TONSILLECTOMY AND ADENOIDECTOMY      ROS:    As stated in the HPI and negative for all other systems.  PHYSICAL EXAM BP 130/90   Pulse 80   Ht 5' (1.524 m)   Wt 140 lb (63.5 kg)   BMI 27.34 kg/m   GENERAL:  Well appearing NECK:  No jugular venous distention, waveform  within normal limits, carotid upstroke brisk and symmetric, no bruits, no thyromegaly LUNGS:  Clear to auscultation bilaterally BACK:  No CVA tenderness CHEST:  Unremarkable HEART:  PMI not displaced or sustained,S1 and S2 within normal limits, no S3, no S4, no clicks, no rubs, soft apical systolic murmur, no diastolic murmurs ABD:  Flat, positive bowel sounds normal in frequency in pitch, no bruits, no rebound, no guarding, no midline pulsatile mass, no hepatomegaly, no splenomegaly EXT:  2 plus pulses throughout, no edema, no cyanosis no clubbing    Lab Results  Component Value Date   TSH 4.670 (H) 03/26/2017   ALT 34 (H) 03/26/2017   AST 31 03/26/2017   ALKPHOS 60 03/26/2017   BILITOT 0.9 03/26/2017   PROT 6.5 03/26/2017   ALBUMIN 4.2 03/26/2017    ASSESSMENT AND PLAN  CARDIOMYOPATHY -  She seems to be euvolemic.  No  change in therapy is indicated.   HYPERTENSION -  The blood pressure is at target.    Tachycardia-bradycardia syndrome -  She has had no symptoms and is up to date with lab follow up.   She will continue the meds as listed.  Dyslipidemia - She will come back for lipid profile.

## 2017-04-19 ENCOUNTER — Encounter: Payer: Self-pay | Admitting: Cardiology

## 2017-04-19 ENCOUNTER — Ambulatory Visit (INDEPENDENT_AMBULATORY_CARE_PROVIDER_SITE_OTHER): Payer: Medicare Other | Admitting: Cardiology

## 2017-04-19 VITALS — BP 130/90 | HR 80 | Ht 60.0 in | Wt 140.0 lb

## 2017-04-19 DIAGNOSIS — I480 Paroxysmal atrial fibrillation: Secondary | ICD-10-CM

## 2017-04-19 DIAGNOSIS — E785 Hyperlipidemia, unspecified: Secondary | ICD-10-CM

## 2017-04-19 DIAGNOSIS — Z79899 Other long term (current) drug therapy: Secondary | ICD-10-CM | POA: Diagnosis not present

## 2017-04-19 HISTORY — DX: Paroxysmal atrial fibrillation: I48.0

## 2017-04-19 NOTE — Patient Instructions (Addendum)
Medication Instructions:  Continue current medications  Labwork: Fasting Lipid Liver  Testing/Procedures: None Ordered  Follow-Up: Your physician recommends that you schedule a follow-up appointment in: 4 Months with Dr Percival Spanish.   Any Other Special Instructions Will Be Listed Below (If Applicable).   If you need a refill on your cardiac medications before your next appointment, please call your pharmacy.    Thank you for choosing [facility] at [facility]!!

## 2017-04-23 ENCOUNTER — Other Ambulatory Visit: Payer: Self-pay | Admitting: Nurse Practitioner

## 2017-05-04 ENCOUNTER — Other Ambulatory Visit: Payer: Medicare Other | Admitting: *Deleted

## 2017-05-04 ENCOUNTER — Ambulatory Visit (INDEPENDENT_AMBULATORY_CARE_PROVIDER_SITE_OTHER): Payer: Medicare Other | Admitting: *Deleted

## 2017-05-04 DIAGNOSIS — R7989 Other specified abnormal findings of blood chemistry: Secondary | ICD-10-CM

## 2017-05-04 DIAGNOSIS — Z5181 Encounter for therapeutic drug level monitoring: Secondary | ICD-10-CM

## 2017-05-04 DIAGNOSIS — I4891 Unspecified atrial fibrillation: Secondary | ICD-10-CM

## 2017-05-04 DIAGNOSIS — Z79899 Other long term (current) drug therapy: Secondary | ICD-10-CM

## 2017-05-04 LAB — POCT INR: INR: 2.4

## 2017-05-04 LAB — TSH: TSH: 5.5 u[IU]/mL — ABNORMAL HIGH (ref 0.450–4.500)

## 2017-05-20 ENCOUNTER — Telehealth: Payer: Self-pay | Admitting: Internal Medicine

## 2017-05-20 DIAGNOSIS — R7989 Other specified abnormal findings of blood chemistry: Secondary | ICD-10-CM

## 2017-05-20 NOTE — Telephone Encounter (Signed)
New Message '    Pt returning call from 8/31 for blood work results

## 2017-05-20 NOTE — Telephone Encounter (Signed)
I spoke with the patient- she is aware of her repeat TSH that was done and Dr. Olin Pia recommendations for free t3/ free t4 to be done. She is coming to the office on 06/01/17 for a coumadin check. Will draw additional thyroid studies at that time.

## 2017-06-01 ENCOUNTER — Other Ambulatory Visit: Payer: Medicare Other

## 2017-06-01 ENCOUNTER — Ambulatory Visit (INDEPENDENT_AMBULATORY_CARE_PROVIDER_SITE_OTHER): Payer: Medicare Other | Admitting: Pharmacist

## 2017-06-01 DIAGNOSIS — I4891 Unspecified atrial fibrillation: Secondary | ICD-10-CM | POA: Diagnosis not present

## 2017-06-01 DIAGNOSIS — R7989 Other specified abnormal findings of blood chemistry: Secondary | ICD-10-CM

## 2017-06-01 DIAGNOSIS — Z5181 Encounter for therapeutic drug level monitoring: Secondary | ICD-10-CM | POA: Diagnosis not present

## 2017-06-01 LAB — POCT INR: INR: 2.3

## 2017-06-01 LAB — T3, FREE: T3, Free: 1.7 pg/mL — ABNORMAL LOW (ref 2.0–4.4)

## 2017-06-01 LAB — T4, FREE: Free T4: 1.63 ng/dL (ref 0.82–1.77)

## 2017-06-20 ENCOUNTER — Other Ambulatory Visit: Payer: Self-pay | Admitting: Cardiology

## 2017-06-28 ENCOUNTER — Encounter: Payer: Medicare Other | Admitting: *Deleted

## 2017-06-28 ENCOUNTER — Telehealth: Payer: Self-pay | Admitting: Cardiology

## 2017-06-28 NOTE — Telephone Encounter (Signed)
Attempted to confirm remote transmission with pt. No answer and was unable to leave a message.   

## 2017-06-29 ENCOUNTER — Other Ambulatory Visit: Payer: Medicare Other

## 2017-07-02 ENCOUNTER — Encounter: Payer: Self-pay | Admitting: Cardiology

## 2017-07-05 ENCOUNTER — Ambulatory Visit (INDEPENDENT_AMBULATORY_CARE_PROVIDER_SITE_OTHER): Payer: Medicare Other | Admitting: *Deleted

## 2017-07-05 DIAGNOSIS — I495 Sick sinus syndrome: Secondary | ICD-10-CM

## 2017-07-05 NOTE — Progress Notes (Signed)
Remote pacemaker transmission.   

## 2017-07-06 ENCOUNTER — Ambulatory Visit (INDEPENDENT_AMBULATORY_CARE_PROVIDER_SITE_OTHER): Payer: Medicare Other | Admitting: *Deleted

## 2017-07-06 DIAGNOSIS — I4891 Unspecified atrial fibrillation: Secondary | ICD-10-CM | POA: Diagnosis not present

## 2017-07-06 DIAGNOSIS — Z5181 Encounter for therapeutic drug level monitoring: Secondary | ICD-10-CM

## 2017-07-06 LAB — POCT INR: INR: 4.2

## 2017-07-07 ENCOUNTER — Other Ambulatory Visit: Payer: Self-pay | Admitting: *Deleted

## 2017-07-07 LAB — CUP PACEART REMOTE DEVICE CHECK
Battery Impedance: 344 Ohm
Battery Remaining Longevity: 99 mo
Battery Voltage: 2.78 V
Brady Statistic AP VP Percent: 0 %
Brady Statistic AP VS Percent: 99 %
Brady Statistic AS VP Percent: 0 %
Brady Statistic AS VS Percent: 0 %
Date Time Interrogation Session: 20181021191902
Implantable Lead Implant Date: 20130103
Implantable Lead Implant Date: 20130103
Implantable Lead Location: 753859
Implantable Lead Location: 753860
Implantable Lead Model: 1944
Implantable Lead Model: 1948
Implantable Pulse Generator Implant Date: 20130103
Lead Channel Impedance Value: 411 Ohm
Lead Channel Impedance Value: 719 Ohm
Lead Channel Pacing Threshold Amplitude: 0.375 V
Lead Channel Pacing Threshold Amplitude: 0.625 V
Lead Channel Pacing Threshold Pulse Width: 0.4 ms
Lead Channel Pacing Threshold Pulse Width: 0.4 ms
Lead Channel Setting Pacing Amplitude: 2 V
Lead Channel Setting Pacing Amplitude: 2.5 V
Lead Channel Setting Pacing Pulse Width: 0.4 ms
Lead Channel Setting Sensing Sensitivity: 5.6 mV

## 2017-07-07 MED ORDER — WARFARIN SODIUM 2 MG PO TABS: ORAL_TABLET | ORAL | 1 refills | Status: DC

## 2017-07-08 ENCOUNTER — Telehealth: Payer: Self-pay | Admitting: Cardiology

## 2017-07-08 NOTE — Telephone Encounter (Signed)
New message    Pt daughter is calling. She has two questions. She said pt has a CNA and are supposed to take her BP when they come to see pt. She is asking if it's doctors order that the CNA do it because the pt states she does it herself.

## 2017-07-08 NOTE — Telephone Encounter (Signed)
Returned call to [name], daughter. Patient has a home health CNA which is "private pay". No MD ordered for patient to have home health/CNA care. She would like to know if MD has an order for the CNA to take patient's BP or if patient can use her own BP cuff to take BP. Advised that since our MD did not write orders for this, then it is up to them/patient to direct the CNA on how to care for patient.

## 2017-07-09 ENCOUNTER — Encounter: Payer: Self-pay | Admitting: Cardiology

## 2017-07-20 ENCOUNTER — Ambulatory Visit (INDEPENDENT_AMBULATORY_CARE_PROVIDER_SITE_OTHER): Payer: Medicare Other | Admitting: Pharmacist

## 2017-07-20 ENCOUNTER — Encounter (INDEPENDENT_AMBULATORY_CARE_PROVIDER_SITE_OTHER): Payer: Self-pay

## 2017-07-20 DIAGNOSIS — Z5181 Encounter for therapeutic drug level monitoring: Secondary | ICD-10-CM | POA: Diagnosis not present

## 2017-07-20 DIAGNOSIS — I4891 Unspecified atrial fibrillation: Secondary | ICD-10-CM | POA: Diagnosis not present

## 2017-07-20 LAB — POCT INR: INR: 2.8

## 2017-07-28 ENCOUNTER — Other Ambulatory Visit: Payer: Self-pay | Admitting: Cardiology

## 2017-08-11 ENCOUNTER — Encounter: Payer: Self-pay | Admitting: Cardiology

## 2017-08-18 NOTE — Progress Notes (Signed)
HPI The patient presents after followup of had tachybradycardia syndrome.  Since I last saw her she has done well.  The patient denies any new symptoms such as chest discomfort, neck or arm discomfort. There has been no new shortness of breath, PND or orthopnea. There have been no reported palpitations, presyncope or syncope.      No Known Allergies  Current Outpatient Medications  Medication Sig Dispense Refill  . amiodarone (PACERONE) 200 MG tablet Take 1 tablet (200 mg total) by mouth daily. 90 tablet 3  . atorvastatin (LIPITOR) 10 MG tablet Take 5 mg by mouth daily.    . brimonidine (ALPHAGAN) 0.2 % ophthalmic solution Place 1 drop into both eyes daily.    . furosemide (LASIX) 80 MG tablet TAKE 1 TABLET(80 MG) BY MOUTH DAILY 90 tablet 0  . lisinopril (PRINIVIL,ZESTRIL) 5 MG tablet TAKE 1 TABLET(5 MG) BY MOUTH DAILY 90 tablet 0  . metoprolol tartrate (LOPRESSOR) 50 MG tablet TAKE 1 AND 1/2 TABLETS(75 MG) BY MOUTH TWICE DAILY 90 tablet 10  . Multiple Vitamin (MULTIVITAMIN) tablet Take 1 tablet by mouth daily.      . Olopatadine HCl (PATADAY) 0.2 % SOLN Place 1 drop into both eyes daily.     . potassium chloride SA (K-DUR,KLOR-CON) 20 MEQ tablet TAKE 1 TABLET BY MOUTH EVERY DAY 90 tablet 3  . TRAVATAN Z 0.004 % SOLN ophthalmic solution Place 1 drop into both eyes at bedtime.  3  . warfarin (COUMADIN) 2 MG tablet Take as directed by coumadin clinic 120 tablet 1   No current facility-administered medications for this visit.     Past Medical History:  Diagnosis Date  . Anemia   . Arthritis    "knees especially"  . Bradycardia   . Cardiomyopathy    presumed ischemic in the past with an EF of 35%. The most recent EF in 03-2008, however, was 55 -60%  . Chronic renal insufficiency   . Dementia    mild  . Dyslipidemia   . Glaucoma   . Hypertension   . Malleolar fracture   . Paroxysmal atrial fibrillation [facility])     Past Surgical History:  Procedure Laterality Date  .  ABDOMINAL HYSTERECTOMY    . CARDIOVERSION  07/2011  . EYE SURGERY  2002   "blood clot behind eye"  . FRACTURE SURGERY  ~ 2007   RLE  . HEMORRHOID SURGERY    . INSERT / REPLACE / REMOVE PACEMAKER  09/17/11   initial placement  . INSERT / REPLACE / REMOVE PACEMAKER  09/18/11  . PACEMAKER REVISION N/A 09/18/2011   Procedure: PACEMAKER REVISION;  Surgeon: Thompson Grayer, MD;  Location: [facility] CATH LAB;  Service: Cardiovascular;  Laterality: N/A;  . PERMANENT PACEMAKER INSERTION N/A 09/17/2011   Procedure: PERMANENT PACEMAKER INSERTION;  Surgeon: Deboraha Sprang, MD;  Location: [facility] CATH LAB;  Service: Cardiovascular;  Laterality: N/A;  . TONSILLECTOMY AND ADENOIDECTOMY      ROS:    As stated in the HPI and negative for all other systems.   PHYSICAL EXAM BP 133/87   Pulse 87   Ht 5' (1.524 m)   Wt 136 lb 12.8 oz (62.1 kg)   BMI 26.72 kg/m   GENERAL:  Well appearing NECK:  No jugular venous distention, waveform within normal limits, carotid upstroke brisk and symmetric, no bruits, no thyromegaly LUNGS:  Clear to auscultation bilaterally CHEST:  Well healed pacer pocket.   HEART:  PMI not displaced or sustained,S1 and  S2 within normal limits, no S3, no S4, no clicks, no rubs, soft apical systolic murmur, no diastolic murmurs ABD:  Flat, positive bowel sounds normal in frequency in pitch, no bruits, no rebound, no guarding, no midline pulsatile mass, no hepatomegaly, no splenomegaly EXT:  2 plus pulses throughout, no edema, no cyanosis no clubbing    Lab Results  Component Value Date   TSH 5.500 (H) 05/04/2017   ALT 34 (H) 03/26/2017   AST 31 03/26/2017   ALKPHOS 60 03/26/2017   BILITOT 0.9 03/26/2017   PROT 6.5 03/26/2017   ALBUMIN 4.2 03/26/2017    ASSESSMENT AND PLAN   CARDIOMYOPATHY -  She seems to be euvolemic.  No change in therapy.   HYPERTENSION -  The blood pressure is at target. No change in medications is indicated. We will continue with therapeutic lifestyle changes  (TLC).  Tachycardia-bradycardia syndrome -  No change in therapy.   Dyslipidemia - She was to have a lipid profile after the last visit.  I will arrange this.

## 2017-08-19 ENCOUNTER — Ambulatory Visit (INDEPENDENT_AMBULATORY_CARE_PROVIDER_SITE_OTHER): Payer: Medicare Other | Admitting: Pharmacist Clinician (PhC)/ Clinical Pharmacy Specialist

## 2017-08-19 ENCOUNTER — Encounter: Payer: Self-pay | Admitting: Cardiology

## 2017-08-19 ENCOUNTER — Ambulatory Visit (INDEPENDENT_AMBULATORY_CARE_PROVIDER_SITE_OTHER): Payer: Medicare Other | Admitting: Cardiology

## 2017-08-19 VITALS — BP 133/87 | HR 87 | Ht 60.0 in | Wt 136.8 lb

## 2017-08-19 DIAGNOSIS — Z5181 Encounter for therapeutic drug level monitoring: Secondary | ICD-10-CM

## 2017-08-19 DIAGNOSIS — I4891 Unspecified atrial fibrillation: Secondary | ICD-10-CM

## 2017-08-19 DIAGNOSIS — Z950 Presence of cardiac pacemaker: Secondary | ICD-10-CM

## 2017-08-19 DIAGNOSIS — I420 Dilated cardiomyopathy: Secondary | ICD-10-CM | POA: Diagnosis not present

## 2017-08-19 LAB — POCT INR: INR: 2.6

## 2017-08-19 NOTE — Patient Instructions (Signed)
Medication Instructions:  Continue current medications  If you need a refill on your cardiac medications before your next appointment, please call your pharmacy.  Labwork: None Ordered  Testing/Procedures: None Ordered  Follow-Up: Your physician wants you to follow-up in: 4 Months.    Thank you for choosing [facility] at [facility]!!      

## 2017-09-02 ENCOUNTER — Other Ambulatory Visit: Payer: Self-pay | Admitting: Cardiology

## 2017-09-21 ENCOUNTER — Ambulatory Visit (INDEPENDENT_AMBULATORY_CARE_PROVIDER_SITE_OTHER): Payer: Medicare Other | Admitting: *Deleted

## 2017-09-21 DIAGNOSIS — I4891 Unspecified atrial fibrillation: Secondary | ICD-10-CM

## 2017-09-21 DIAGNOSIS — Z5181 Encounter for therapeutic drug level monitoring: Secondary | ICD-10-CM

## 2017-09-21 LAB — POCT INR: INR: 2.8

## 2017-09-21 NOTE — Patient Instructions (Signed)
Description   Continue on same dosage 1 tablet daily except 1/2 tablet on Sundays. Continue eating 2 servings of dark leafy green weekly.  Recheck INR in 4 weeks.  Call our office if you have any concerns [phone]. Please call if you decide to do dental extractions.

## 2017-10-04 ENCOUNTER — Encounter: Payer: Medicare Other | Admitting: *Deleted

## 2017-10-04 ENCOUNTER — Telehealth: Payer: Self-pay | Admitting: Cardiology

## 2017-10-04 NOTE — Telephone Encounter (Signed)
LMOVM reminding pt to send remote transmission.   

## 2017-10-07 ENCOUNTER — Encounter: Payer: Self-pay | Admitting: Cardiology

## 2017-10-07 NOTE — Progress Notes (Signed)
Letter  

## 2017-10-11 ENCOUNTER — Ambulatory Visit (INDEPENDENT_AMBULATORY_CARE_PROVIDER_SITE_OTHER): Payer: Medicare Other | Admitting: *Deleted

## 2017-10-11 DIAGNOSIS — I495 Sick sinus syndrome: Secondary | ICD-10-CM | POA: Diagnosis not present

## 2017-10-11 NOTE — Progress Notes (Signed)
Remote pacemaker transmission.   

## 2017-10-12 LAB — CUP PACEART REMOTE DEVICE CHECK
Battery Impedance: 344 Ohm
Battery Remaining Longevity: 98 mo
Battery Voltage: 2.78 V
Brady Statistic AP VP Percent: 0 %
Brady Statistic AP VS Percent: 99 %
Brady Statistic AS VP Percent: 0 %
Brady Statistic AS VS Percent: 0 %
Date Time Interrogation Session: 20190127205607
Implantable Lead Implant Date: 20130103
Implantable Lead Implant Date: 20130103
Implantable Lead Location: 753859
Implantable Lead Location: 753860
Implantable Lead Model: 1944
Implantable Lead Model: 1948
Implantable Pulse Generator Implant Date: 20130103
Lead Channel Impedance Value: 391 Ohm
Lead Channel Impedance Value: 701 Ohm
Lead Channel Pacing Threshold Amplitude: 0.375 V
Lead Channel Pacing Threshold Amplitude: 0.625 V
Lead Channel Pacing Threshold Pulse Width: 0.4 ms
Lead Channel Pacing Threshold Pulse Width: 0.4 ms
Lead Channel Sensing Intrinsic Amplitude: 16 mV
Lead Channel Setting Pacing Amplitude: 2 V
Lead Channel Setting Pacing Amplitude: 2.5 V
Lead Channel Setting Pacing Pulse Width: 0.4 ms
Lead Channel Setting Sensing Sensitivity: 5.6 mV

## 2017-10-13 ENCOUNTER — Encounter: Payer: Self-pay | Admitting: Cardiology

## 2017-10-19 ENCOUNTER — Ambulatory Visit (INDEPENDENT_AMBULATORY_CARE_PROVIDER_SITE_OTHER): Payer: Medicare Other

## 2017-10-19 DIAGNOSIS — I4891 Unspecified atrial fibrillation: Secondary | ICD-10-CM

## 2017-10-19 DIAGNOSIS — Z5181 Encounter for therapeutic drug level monitoring: Secondary | ICD-10-CM

## 2017-10-19 LAB — POCT INR: INR: 2.6

## 2017-10-19 NOTE — Patient Instructions (Signed)
Description   Continue on same dosage 1 tablet daily except 1/2 tablet on Sundays. Continue eating 2 servings of dark leafy green weekly.  Recheck INR in 4 weeks. Pt prefers 4 week follow up.  Call our office if you have any concerns [phone]. Please call if you decide to do dental extractions.

## 2017-10-27 ENCOUNTER — Other Ambulatory Visit: Payer: Self-pay | Admitting: Cardiology

## 2017-11-16 ENCOUNTER — Ambulatory Visit (INDEPENDENT_AMBULATORY_CARE_PROVIDER_SITE_OTHER): Payer: Medicare Other | Admitting: *Deleted

## 2017-11-16 DIAGNOSIS — Z5181 Encounter for therapeutic drug level monitoring: Secondary | ICD-10-CM | POA: Diagnosis not present

## 2017-11-16 DIAGNOSIS — I4891 Unspecified atrial fibrillation: Secondary | ICD-10-CM | POA: Diagnosis not present

## 2017-11-16 LAB — POCT INR: INR: 2.6

## 2017-11-16 NOTE — Patient Instructions (Signed)
Description   Continue on same dosage 1 tablet daily except 1/2 tablet on Sundays. Continue eating 2 servings of dark leafy green weekly.  Recheck INR in 4 weeks. Pt prefers 4 week follow up.  Call our office if you have any concerns [phone]. Please call if you decide to do dental extractions.

## 2017-11-25 ENCOUNTER — Other Ambulatory Visit: Payer: Self-pay | Admitting: Cardiology

## 2017-11-29 ENCOUNTER — Other Ambulatory Visit: Payer: Self-pay | Admitting: Pharmacist

## 2017-11-29 MED ORDER — WARFARIN SODIUM 2 MG PO TABS: ORAL_TABLET | ORAL | 0 refills | Status: DC

## 2017-12-10 DIAGNOSIS — Z950 Presence of cardiac pacemaker: Secondary | ICD-10-CM | POA: Diagnosis not present

## 2017-12-10 DIAGNOSIS — S32511D Fracture of superior rim of right pubis, subsequent encounter for fracture with routine healing: Secondary | ICD-10-CM | POA: Diagnosis not present

## 2017-12-10 DIAGNOSIS — S32602D Unspecified fracture of left ischium, subsequent encounter for fracture with routine healing: Secondary | ICD-10-CM | POA: Diagnosis not present

## 2017-12-10 DIAGNOSIS — I5022 Chronic systolic (congestive) heart failure: Secondary | ICD-10-CM | POA: Diagnosis present

## 2017-12-10 DIAGNOSIS — I4891 Unspecified atrial fibrillation: Secondary | ICD-10-CM | POA: Diagnosis present

## 2017-12-10 DIAGNOSIS — I130 Hypertensive heart and chronic kidney disease with heart failure and stage 1 through stage 4 chronic kidney disease, or unspecified chronic kidney disease: Secondary | ICD-10-CM | POA: Diagnosis present

## 2017-12-10 DIAGNOSIS — S32401D Unspecified fracture of right acetabulum, subsequent encounter for fracture with routine healing: Secondary | ICD-10-CM | POA: Diagnosis not present

## 2017-12-10 DIAGNOSIS — N189 Chronic kidney disease, unspecified: Secondary | ICD-10-CM | POA: Diagnosis present

## 2017-12-20 ENCOUNTER — Telehealth: Payer: Self-pay | Admitting: *Deleted

## 2017-12-20 NOTE — Telephone Encounter (Signed)
Patient daughter([name]) calling,  States that her mother is currently in the hospital and has been since 12-10-17. Patient has not been able to send in her transmission

## 2017-12-20 NOTE — Telephone Encounter (Signed)
LMOVM advising that next remote is not scheduled until 01/10/18.  [place] phone number for any questions/concerns.

## 2018-01-03 ENCOUNTER — Telehealth: Payer: Self-pay | Admitting: Cardiology

## 2018-01-03 ENCOUNTER — Ambulatory Visit (INDEPENDENT_AMBULATORY_CARE_PROVIDER_SITE_OTHER): Payer: Medicare Other | Admitting: Cardiovascular Disease

## 2018-01-03 DIAGNOSIS — I4891 Unspecified atrial fibrillation: Secondary | ICD-10-CM

## 2018-01-03 DIAGNOSIS — Z5181 Encounter for therapeutic drug level monitoring: Secondary | ICD-10-CM

## 2018-01-03 LAB — POCT INR: INR: 2

## 2018-01-03 NOTE — Telephone Encounter (Signed)
Returned call to [employer] with [facility]. Please refer to Anticoagulation Encounter regarding INR management & Warfarin/Coumadin dosing.

## 2018-01-03 NOTE — Telephone Encounter (Signed)
Returned call to [employer] RN with [facility].She was calling to report patient's INR today 2.0.Patient is currently taking coumadin 2 mg daily.She wants to ask Dr.Hochrein if ok to apply a tele monitor that will monitor patient's B/P,pulse,weight,02 sat daily.She will fax order for Dr.Hochrein to sign.Advised I will send message to Dr.Hochrein and pharmacy.

## 2018-01-03 NOTE — Telephone Encounter (Signed)
[employer] patient. Will forward message to them.

## 2018-01-03 NOTE — Telephone Encounter (Signed)
Want to know if Dr Percival Spanish will sign the home health orders and want to give her inr check for today to someone. Please call Chrys Racer

## 2018-01-03 NOTE — Patient Instructions (Signed)
Description   Spoke with Chrys Racer RN with [facility] and instructed to pt to continue taking 1 tablet daily which is the dose pt has been taking since hospital d/c (previous dose tablet daily except 1/2 tablet on Sundays). Continue eating 2 servings of dark leafy green weekly.  Recheck INR in on Friday.  Call our office if you have any concerns [phone]. Please call if you decide to do dental extractions.

## 2018-01-07 ENCOUNTER — Ambulatory Visit (INDEPENDENT_AMBULATORY_CARE_PROVIDER_SITE_OTHER): Payer: Medicare Other | Admitting: Internal Medicine

## 2018-01-07 DIAGNOSIS — I4891 Unspecified atrial fibrillation: Secondary | ICD-10-CM | POA: Diagnosis not present

## 2018-01-07 DIAGNOSIS — I480 Paroxysmal atrial fibrillation: Secondary | ICD-10-CM

## 2018-01-07 DIAGNOSIS — Z5181 Encounter for therapeutic drug level monitoring: Secondary | ICD-10-CM

## 2018-01-07 LAB — POCT INR: INR: 2.1

## 2018-01-07 NOTE — Patient Instructions (Signed)
Description   Spoke with Chrys Racer RN with [facility] and instructed to have pt  continue taking 1 tablet daily Chrys Racer states she will call pt's daughter and give her above instructions . Continue eating 2 servings of dark leafy green weekly.  Recheck INR in 2 weeks.  Call our office if you have any concerns [phone]. Please call if you decide to do dental extractions.

## 2018-01-10 ENCOUNTER — Ambulatory Visit (INDEPENDENT_AMBULATORY_CARE_PROVIDER_SITE_OTHER): Payer: Medicare Other | Admitting: *Deleted

## 2018-01-10 ENCOUNTER — Telehealth: Payer: Self-pay | Admitting: Cardiology

## 2018-01-10 DIAGNOSIS — I495 Sick sinus syndrome: Secondary | ICD-10-CM

## 2018-01-10 NOTE — Telephone Encounter (Signed)
Spoke with pt and reminded pt of remote transmission that is due today. Pt verbalized understanding.   

## 2018-01-11 ENCOUNTER — Encounter: Payer: Self-pay | Admitting: Cardiology

## 2018-01-11 NOTE — Progress Notes (Signed)
Remote pacemaker transmission.   

## 2018-01-12 LAB — CUP PACEART REMOTE DEVICE CHECK
Battery Impedance: 416 Ohm
Battery Remaining Longevity: 92 mo
Battery Voltage: 2.78 V
Brady Statistic AP VP Percent: 0 %
Brady Statistic AP VS Percent: 99 %
Brady Statistic AS VP Percent: 0 %
Brady Statistic AS VS Percent: 0 %
Date Time Interrogation Session: 20190430032813
Implantable Lead Implant Date: 20130103
Implantable Lead Implant Date: 20130103
Implantable Lead Location: 753859
Implantable Lead Location: 753860
Implantable Lead Model: 1944
Implantable Lead Model: 1948
Implantable Pulse Generator Implant Date: 20130103
Lead Channel Impedance Value: 401 Ohm
Lead Channel Impedance Value: 716 Ohm
Lead Channel Pacing Threshold Amplitude: 0.375 V
Lead Channel Pacing Threshold Amplitude: 0.625 V
Lead Channel Pacing Threshold Pulse Width: 0.4 ms
Lead Channel Pacing Threshold Pulse Width: 0.4 ms
Lead Channel Setting Pacing Amplitude: 2 V
Lead Channel Setting Pacing Amplitude: 2.5 V
Lead Channel Setting Pacing Pulse Width: 0.4 ms
Lead Channel Setting Sensing Sensitivity: 5.6 mV

## 2018-01-24 ENCOUNTER — Ambulatory Visit (INDEPENDENT_AMBULATORY_CARE_PROVIDER_SITE_OTHER): Payer: Medicare Other | Admitting: Internal Medicine

## 2018-01-24 DIAGNOSIS — I4891 Unspecified atrial fibrillation: Secondary | ICD-10-CM

## 2018-01-24 DIAGNOSIS — Z5181 Encounter for therapeutic drug level monitoring: Secondary | ICD-10-CM

## 2018-01-24 LAB — POCT INR: INR: 4.1

## 2018-01-31 ENCOUNTER — Ambulatory Visit (INDEPENDENT_AMBULATORY_CARE_PROVIDER_SITE_OTHER): Payer: Medicare Other | Admitting: Cardiology

## 2018-01-31 DIAGNOSIS — Z5181 Encounter for therapeutic drug level monitoring: Secondary | ICD-10-CM | POA: Diagnosis not present

## 2018-01-31 DIAGNOSIS — I4891 Unspecified atrial fibrillation: Secondary | ICD-10-CM | POA: Diagnosis not present

## 2018-01-31 LAB — POCT INR: INR: 2.9 (ref 2.0–3.0)

## 2018-02-08 ENCOUNTER — Telehealth: Payer: Self-pay | Admitting: Cardiology

## 2018-02-08 ENCOUNTER — Ambulatory Visit (INDEPENDENT_AMBULATORY_CARE_PROVIDER_SITE_OTHER): Payer: Medicare Other | Admitting: Cardiovascular Disease

## 2018-02-08 DIAGNOSIS — I4891 Unspecified atrial fibrillation: Secondary | ICD-10-CM

## 2018-02-08 DIAGNOSIS — Z5181 Encounter for therapeutic drug level monitoring: Secondary | ICD-10-CM | POA: Diagnosis not present

## 2018-02-08 DIAGNOSIS — I480 Paroxysmal atrial fibrillation: Secondary | ICD-10-CM | POA: Diagnosis not present

## 2018-02-08 LAB — POCT INR: INR: 4 — AB (ref 2.0–3.0)

## 2018-02-08 NOTE — Telephone Encounter (Signed)
New Message   Pt c/o medication issue:  1. Name of Medication: atorvastatin (LIPITOR) 10 MG tablet  2. How are you currently taking this medication (dosage and times per day)?Take 0.5 tablets (5 mg total) by mouth at bedtime.  3. Are you having a reaction (difficulty breathing--STAT)? no  4. What is your medication issue? Pt's daughter is calling, wanting to know if the pt is suppose to take half a tablet or a whole tablet. Please call

## 2018-02-08 NOTE — Telephone Encounter (Signed)
Returned call to daughter (ok per DPR)-aware per last OV note patient is taking 5mg  atorvastatin (1/2 tablet).   She is aware and verbalized understanding.

## 2018-02-08 NOTE — Patient Instructions (Signed)
Description   Spoke with Hoyle Sauer Nurse with [facility] while in the home with the pt and instructed to have her hold coumadin on May 29th as she has already taken today and then  continue taking 1 tablet daily - also gave verbal order to Chrys Racer from [place] to recheck on Monday June3rd Increase intake of dark leafy greens to 4 servings  weekly.  Recheck INR in 1 week.  Call our office if you have any concerns [phone]. Please call if you decide to do dental extractions.

## 2018-02-14 ENCOUNTER — Other Ambulatory Visit: Payer: Self-pay | Admitting: Cardiology

## 2018-02-14 ENCOUNTER — Telehealth: Payer: Self-pay | Admitting: Cardiology

## 2018-02-14 ENCOUNTER — Ambulatory Visit (INDEPENDENT_AMBULATORY_CARE_PROVIDER_SITE_OTHER): Payer: Medicare Other | Admitting: Cardiology

## 2018-02-14 DIAGNOSIS — I4891 Unspecified atrial fibrillation: Secondary | ICD-10-CM | POA: Diagnosis not present

## 2018-02-14 DIAGNOSIS — Z5181 Encounter for therapeutic drug level monitoring: Secondary | ICD-10-CM | POA: Diagnosis not present

## 2018-02-14 LAB — POCT INR: INR: 3.1 — AB (ref 2.0–3.0)

## 2018-02-14 NOTE — Telephone Encounter (Signed)
Rx sent to pharmacy   

## 2018-02-14 NOTE — Telephone Encounter (Signed)
Entered in error

## 2018-02-24 ENCOUNTER — Ambulatory Visit (INDEPENDENT_AMBULATORY_CARE_PROVIDER_SITE_OTHER): Payer: Medicare Other | Admitting: Internal Medicine

## 2018-02-24 DIAGNOSIS — Z5181 Encounter for therapeutic drug level monitoring: Secondary | ICD-10-CM

## 2018-02-24 DIAGNOSIS — I4891 Unspecified atrial fibrillation: Secondary | ICD-10-CM | POA: Diagnosis not present

## 2018-02-24 DIAGNOSIS — I480 Paroxysmal atrial fibrillation: Secondary | ICD-10-CM

## 2018-02-24 LAB — POCT INR: INR: 2.3 (ref 2.0–3.0)

## 2018-02-24 NOTE — Patient Instructions (Signed)
Description   Spoke with [place] Nurse with [facility] while in the home with the pt and instructed to continue same  dose of coumadin  2mg  daily except 1mg  on Mondays.  Recheck INR in 2 weeks.  Call our office if you have any concerns [phone]. Please call if you decide to do dental extractions.

## 2018-03-03 NOTE — Progress Notes (Signed)
HPI The patient presents after followup of had tachybradycardia syndrome.  Since I last saw her she had a f and fractured her pelvis.all I was able to review the labs from her local hospital and reviewed the notes.  There were no acute cardiac complaints.  Apparently she did have some excess volume noted on chest x-ray.  Her INR was slightly elevated at one point.  She was given a couple of days of increased diuretic.  She had her warfarin regulated.  She went to rehab.  She is getting around slower than before and still uses a walker.  She did not have presyncope or syncope.  This was a mechanical fall.  She has not had any new shortness of breath, PND or orthopnea.  She has not had any weight gain or edema.  She has a little pain in both of her legs and joints.   No Known Allergies  Current Outpatient Medications  Medication Sig Dispense Refill  . amiodarone (PACERONE) 200 MG tablet TAKE 1 TABLET(200 MG) BY MOUTH DAILY 90 tablet 1  . atorvastatin (LIPITOR) 10 MG tablet Take 0.5 tablets (5 mg total) by mouth at bedtime. 90 tablet 0  . brimonidine (ALPHAGAN) 0.2 % ophthalmic solution Place 1 drop into both eyes daily.    . furosemide (LASIX) 80 MG tablet TAKE 1 TABLET(80 MG) BY MOUTH DAILY 90 tablet 1  . lisinopril (PRINIVIL,ZESTRIL) 5 MG tablet TAKE 1 TABLET(5 MG) BY MOUTH DAILY 90 tablet 0  . metoprolol tartrate (LOPRESSOR) 50 MG tablet TAKE 1 AND 1/2 TABLETS(75 MG) BY MOUTH TWICE DAILY 90 tablet 10  . Multiple Vitamin (MULTIVITAMIN) tablet Take 1 tablet by mouth daily.      . Olopatadine HCl (PATADAY) 0.2 % SOLN Place 1 drop into both eyes daily.     . potassium chloride SA (K-DUR,KLOR-CON) 20 MEQ tablet TAKE 1 TABLET BY MOUTH EVERY DAY 90 tablet 3  . TRAVATAN Z 0.004 % SOLN ophthalmic solution Place 1 drop into both eyes at bedtime.  3  . warfarin (COUMADIN) 2 MG tablet Take 1/2 to 1 tablet daily as directed by coumadin clinic 90 tablet 0   No current facility-administered  medications for this visit.     Past Medical History:  Diagnosis Date  . Anemia   . Arthritis    "knees especially"  . Bradycardia   . Cardiomyopathy    presumed ischemic in the past with an EF of 35%. The most recent EF in 03-2008, however, was 55 -60%  . Chronic renal insufficiency   . Dementia    mild  . Dyslipidemia   . Glaucoma   . Hypertension   . Malleolar fracture   . Paroxysmal atrial fibrillation [facility])     Past Surgical History:  Procedure Laterality Date  . ABDOMINAL HYSTERECTOMY    . CARDIOVERSION  07/2011  . EYE SURGERY  2002   "blood clot behind eye"  . FRACTURE SURGERY  ~ 2007   RLE  . HEMORRHOID SURGERY    . INSERT / REPLACE / REMOVE PACEMAKER  09/17/11   initial placement  . INSERT / REPLACE / REMOVE PACEMAKER  09/18/11  . PACEMAKER REVISION N/A 09/18/2011   Procedure: PACEMAKER REVISION;  Surgeon: Thompson Grayer, MD;  Location: [facility] CATH LAB;  Service: Cardiovascular;  Laterality: N/A;  . PERMANENT PACEMAKER INSERTION N/A 09/17/2011   Procedure: PERMANENT PACEMAKER INSERTION;  Surgeon: Deboraha Sprang, MD;  Location: [facility] CATH LAB;  Service: Cardiovascular;  Laterality: N/A;  . TONSILLECTOMY AND ADENOIDECTOMY      ROS:    As stated in the HPI and negative for all other systems.   PHYSICAL EXAM BP 110/72   Pulse 71   Ht 5' (1.524 m)   Wt 128 lb 6.4 oz (58.2 kg)   BMI 25.08 kg/m   GEN:  No distress but increasing frail with weight loss.  NECK:  No jugular venous distention at 90 degrees, waveform within normal limits, carotid upstroke brisk and symmetric, no bruits, no thyromegalyy LUNGS:  Clear to auscultation bilaterally BACK:  No CVA tenderness CHEST:  Well healed pacemaker.  HEART:  S1 and S2 within normal limits, no S3, no S4, no clicks, no rubs, no murmurs ABD:  Positive bowel sounds normal in frequency in pitch, no bruits, no rebound, no guarding, unable to assess midline mass or bruit with the patient seated. EXT:  2 plus pulses throughout, moderate  edema, no cyanosis no clubbing    Lab Results  Component Value Date   TSH 5.500 (H) 05/04/2017   ALT 34 (H) 03/26/2017   AST 31 03/26/2017   ALKPHOS 60 03/26/2017   BILITOT 0.9 03/26/2017   PROT 6.5 03/26/2017   ALBUMIN 4.2 03/26/2017    ASSESSMENT AND PLAN   CARDIOMYOPATHY -  She seems to be euvolemic.  I reviewed the hospital records.  No change in therapy.    HYPERTENSION -  The blood pressure is at target.  No change in therapy.   Tachycardia-bradycardia syndrome -  She is up to date with device follow up.  I will check a TSH with the amio.  Also, I note that the AST/ALT were mildly elevated so I will repeat these.   Dyslipidemia - This was OK last year and I will repeat this in the future.

## 2018-03-04 ENCOUNTER — Encounter: Payer: Self-pay | Admitting: Cardiology

## 2018-03-04 ENCOUNTER — Ambulatory Visit (INDEPENDENT_AMBULATORY_CARE_PROVIDER_SITE_OTHER): Payer: Medicare Other | Admitting: Cardiology

## 2018-03-04 VITALS — BP 110/72 | HR 71 | Ht 60.0 in | Wt 128.4 lb

## 2018-03-04 DIAGNOSIS — I495 Sick sinus syndrome: Secondary | ICD-10-CM

## 2018-03-04 DIAGNOSIS — Z79899 Other long term (current) drug therapy: Secondary | ICD-10-CM

## 2018-03-04 DIAGNOSIS — I10 Essential (primary) hypertension: Secondary | ICD-10-CM

## 2018-03-04 DIAGNOSIS — I5023 Acute on chronic systolic (congestive) heart failure: Secondary | ICD-10-CM

## 2018-03-04 HISTORY — DX: Acute on chronic systolic (congestive) heart failure: I50.23

## 2018-03-04 NOTE — Patient Instructions (Signed)
Medication Instructions:  Continue current medications  If you need a refill on your cardiac medications before your next appointment, please call your pharmacy.  Labwork: TSH and Liver enyzme HERE IN OUR OFFICE AT [facility]  Take the provided lab slips with you to the lab for your blood draw.   You will NOT need to fast   Testing/Procedures: None Ordered  Follow-Up: Your physician wants you to follow-up in: 3 Months. You should receive a reminder letter in the mail two months in advance. If you do not receive a letter, please call our office [phone].    Thank you for choosing [facility] at [facility]!!

## 2018-03-05 LAB — HEPATIC FUNCTION PANEL
ALT: 25 IU/L (ref 0–32)
AST: 23 IU/L (ref 0–40)
Albumin: 4.2 g/dL (ref 3.2–4.6)
Alkaline Phosphatase: 82 IU/L (ref 39–117)
Bilirubin Total: 0.8 mg/dL (ref 0.0–1.2)
Bilirubin, Direct: 0.27 mg/dL (ref 0.00–0.40)
Total Protein: 6.5 g/dL (ref 6.0–8.5)

## 2018-03-05 LAB — TSH: TSH: 5.53 u[IU]/mL — ABNORMAL HIGH (ref 0.450–4.500)

## 2018-03-11 ENCOUNTER — Ambulatory Visit (INDEPENDENT_AMBULATORY_CARE_PROVIDER_SITE_OTHER): Payer: Medicare Other | Admitting: Internal Medicine

## 2018-03-11 DIAGNOSIS — Z5181 Encounter for therapeutic drug level monitoring: Secondary | ICD-10-CM | POA: Diagnosis not present

## 2018-03-11 DIAGNOSIS — I4891 Unspecified atrial fibrillation: Secondary | ICD-10-CM | POA: Diagnosis not present

## 2018-03-11 LAB — POCT INR: INR: 2.5 (ref 2.0–3.0)

## 2018-03-11 NOTE — Patient Instructions (Signed)
Description   Spoke with Chrys Racer Nurse with [facility] while in the home with the pt and instructed to continue same  dose of coumadin  2mg  daily except 1mg  on Mondays.  Recheck INR in 3 weeks.  Call our office if you have any concerns [phone]. Please call if you decide to do dental extractions.

## 2018-03-24 ENCOUNTER — Other Ambulatory Visit: Payer: Self-pay | Admitting: Cardiology

## 2018-03-29 ENCOUNTER — Encounter: Payer: Medicare Other | Admitting: Internal Medicine

## 2018-03-29 ENCOUNTER — Other Ambulatory Visit: Payer: Self-pay | Admitting: Cardiology

## 2018-04-01 ENCOUNTER — Telehealth: Payer: Self-pay | Admitting: Cardiology

## 2018-04-01 NOTE — Telephone Encounter (Signed)
New message    Mickel Baas from [facility] calling to request lab draw for INR. Patient did not have enough blood to do fingerstick. Please call [phone]

## 2018-04-01 NOTE — Telephone Encounter (Signed)
On phone with [facility] clinic when called back told her they are best to talk with.

## 2018-04-04 ENCOUNTER — Ambulatory Visit (INDEPENDENT_AMBULATORY_CARE_PROVIDER_SITE_OTHER): Payer: Medicare Other | Admitting: Internal Medicine

## 2018-04-04 DIAGNOSIS — I4891 Unspecified atrial fibrillation: Secondary | ICD-10-CM | POA: Diagnosis not present

## 2018-04-04 DIAGNOSIS — Z5181 Encounter for therapeutic drug level monitoring: Secondary | ICD-10-CM | POA: Diagnosis not present

## 2018-04-04 LAB — POCT INR: INR: 3.3 — AB (ref 2.0–3.0)

## 2018-04-10 ENCOUNTER — Other Ambulatory Visit: Payer: Self-pay | Admitting: Cardiology

## 2018-04-11 ENCOUNTER — Ambulatory Visit (INDEPENDENT_AMBULATORY_CARE_PROVIDER_SITE_OTHER): Payer: Medicare Other | Admitting: *Deleted

## 2018-04-11 DIAGNOSIS — I495 Sick sinus syndrome: Secondary | ICD-10-CM | POA: Diagnosis not present

## 2018-04-11 NOTE — Progress Notes (Signed)
Remote pacemaker transmission.   

## 2018-04-12 ENCOUNTER — Encounter: Payer: Self-pay | Admitting: Cardiology

## 2018-04-15 LAB — CUP PACEART REMOTE DEVICE CHECK
Battery Impedance: 417 Ohm
Battery Remaining Longevity: 93 mo
Battery Voltage: 2.78 V
Brady Statistic AP VP Percent: 0 %
Brady Statistic AP VS Percent: 99 %
Brady Statistic AS VP Percent: 0 %
Brady Statistic AS VS Percent: 0 %
Date Time Interrogation Session: 20190728195443
Implantable Lead Implant Date: 20130103
Implantable Lead Implant Date: 20130103
Implantable Lead Location: 753859
Implantable Lead Location: 753860
Implantable Lead Model: 1944
Implantable Lead Model: 1948
Implantable Pulse Generator Implant Date: 20130103
Lead Channel Impedance Value: 406 Ohm
Lead Channel Impedance Value: 716 Ohm
Lead Channel Pacing Threshold Amplitude: 0.25 V
Lead Channel Pacing Threshold Amplitude: 0.625 V
Lead Channel Pacing Threshold Pulse Width: 0.4 ms
Lead Channel Pacing Threshold Pulse Width: 0.4 ms
Lead Channel Sensing Intrinsic Amplitude: 16 mV
Lead Channel Setting Pacing Amplitude: 2 V
Lead Channel Setting Pacing Amplitude: 2.5 V
Lead Channel Setting Pacing Pulse Width: 0.4 ms
Lead Channel Setting Sensing Sensitivity: 5.6 mV

## 2018-04-18 ENCOUNTER — Ambulatory Visit (INDEPENDENT_AMBULATORY_CARE_PROVIDER_SITE_OTHER): Payer: Medicare Other | Admitting: Internal Medicine

## 2018-04-18 DIAGNOSIS — Z5181 Encounter for therapeutic drug level monitoring: Secondary | ICD-10-CM

## 2018-04-18 DIAGNOSIS — I4891 Unspecified atrial fibrillation: Secondary | ICD-10-CM

## 2018-04-18 LAB — POCT INR: INR: 2.1 (ref 2.0–3.0)

## 2018-04-18 NOTE — Patient Instructions (Signed)
Description   Spoke Chrys Racer Nurse with [facility] while in the home with the pt and instructed to continue same  dose of coumadin  2mg  daily except 1mg  on Mondays.  Recheck INR in 3 weeks.  Call our office if you have any concerns [phone]. Please call if you decide to do dental extractions.

## 2018-05-09 ENCOUNTER — Ambulatory Visit (INDEPENDENT_AMBULATORY_CARE_PROVIDER_SITE_OTHER): Payer: Medicare Other | Admitting: Cardiovascular Disease

## 2018-05-09 DIAGNOSIS — Z5181 Encounter for therapeutic drug level monitoring: Secondary | ICD-10-CM

## 2018-05-09 DIAGNOSIS — I4891 Unspecified atrial fibrillation: Secondary | ICD-10-CM

## 2018-05-09 LAB — POCT INR: INR: 2.3 (ref 2.0–3.0)

## 2018-05-09 NOTE — Patient Instructions (Signed)
Description   Spoke [name] Nurse with [facility] while in the home with the pt and instructed to continue same  dose of coumadin  2mg  daily except 1mg  on Mondays.  Recheck INR in 4 weeks.  Call our office if you have any concerns [phone]. Please call if you decide to do dental extractions.

## 2018-05-11 ENCOUNTER — Other Ambulatory Visit: Payer: Self-pay | Admitting: Cardiology

## 2018-05-17 ENCOUNTER — Encounter: Payer: Self-pay | Admitting: *Deleted

## 2018-05-24 DIAGNOSIS — M2011 Hallux valgus (acquired), right foot: Secondary | ICD-10-CM | POA: Diagnosis not present

## 2018-05-24 DIAGNOSIS — L84 Corns and callosities: Secondary | ICD-10-CM | POA: Diagnosis not present

## 2018-05-24 DIAGNOSIS — B351 Tinea unguium: Secondary | ICD-10-CM | POA: Diagnosis not present

## 2018-05-24 DIAGNOSIS — M2012 Hallux valgus (acquired), left foot: Secondary | ICD-10-CM | POA: Diagnosis not present

## 2018-05-25 ENCOUNTER — Ambulatory Visit (INDEPENDENT_AMBULATORY_CARE_PROVIDER_SITE_OTHER): Payer: Medicare PPO | Admitting: Internal Medicine

## 2018-05-25 ENCOUNTER — Encounter: Payer: Self-pay | Admitting: Internal Medicine

## 2018-05-25 VITALS — BP 120/78 | HR 67 | Ht 60.0 in | Wt 129.0 lb

## 2018-05-25 DIAGNOSIS — I495 Sick sinus syndrome: Secondary | ICD-10-CM | POA: Diagnosis not present

## 2018-05-25 DIAGNOSIS — I480 Paroxysmal atrial fibrillation: Secondary | ICD-10-CM | POA: Diagnosis not present

## 2018-05-25 DIAGNOSIS — I420 Dilated cardiomyopathy: Secondary | ICD-10-CM

## 2018-05-25 DIAGNOSIS — Z950 Presence of cardiac pacemaker: Secondary | ICD-10-CM

## 2018-05-25 LAB — HEPATIC FUNCTION PANEL
ALT: 24 IU/L (ref 0–32)
AST: 25 IU/L (ref 0–40)
Albumin: 4.2 g/dL (ref 3.2–4.6)
Alkaline Phosphatase: 78 IU/L (ref 39–117)
Bilirubin Total: 1 mg/dL (ref 0.0–1.2)
Bilirubin, Direct: 0.31 mg/dL (ref 0.00–0.40)
Total Protein: 6.3 g/dL (ref 6.0–8.5)

## 2018-05-25 LAB — CUP PACEART INCLINIC DEVICE CHECK
Battery Impedance: 464 Ohm
Battery Remaining Longevity: 89 mo
Battery Voltage: 2.78 V
Brady Statistic AP VP Percent: 0 %
Brady Statistic AP VS Percent: 99 %
Brady Statistic AS VP Percent: 0 %
Brady Statistic AS VS Percent: 0 %
Date Time Interrogation Session: 20190911150122
Implantable Lead Implant Date: 20130103
Implantable Lead Implant Date: 20130103
Implantable Lead Location: 753859
Implantable Lead Location: 753860
Implantable Lead Model: 1944
Implantable Lead Model: 1948
Implantable Pulse Generator Implant Date: 20130103
Lead Channel Impedance Value: 401 Ohm
Lead Channel Impedance Value: 683 Ohm
Lead Channel Pacing Threshold Amplitude: 0.5 V
Lead Channel Pacing Threshold Amplitude: 0.5 V
Lead Channel Pacing Threshold Pulse Width: 0.4 ms
Lead Channel Pacing Threshold Pulse Width: 0.4 ms
Lead Channel Sensing Intrinsic Amplitude: 22.4 mV
Lead Channel Setting Pacing Amplitude: 2 V
Lead Channel Setting Pacing Amplitude: 2.5 V
Lead Channel Setting Pacing Pulse Width: 0.4 ms
Lead Channel Setting Sensing Sensitivity: 5.6 mV

## 2018-05-25 LAB — TSH: TSH: 5.58 u[IU]/mL — ABNORMAL HIGH (ref 0.450–4.500)

## 2018-05-25 LAB — CBC
Hematocrit: 33.3 % — ABNORMAL LOW (ref 34.0–46.6)
Hemoglobin: 11.1 g/dL (ref 11.1–15.9)
MCH: 32.1 pg (ref 26.6–33.0)
MCHC: 33.3 g/dL (ref 31.5–35.7)
MCV: 96 fL (ref 79–97)
Platelets: 178 10*3/uL (ref 150–450)
RBC: 3.46 x10E6/uL — ABNORMAL LOW (ref 3.77–5.28)
RDW: 14.2 % (ref 12.3–15.4)
WBC: 9.5 10*3/uL (ref 3.4–10.8)

## 2018-05-25 LAB — BASIC METABOLIC PANEL
BUN/Creatinine Ratio: 27 (ref 12–28)
BUN: 41 mg/dL — ABNORMAL HIGH (ref 10–36)
CO2: 31 mmol/L — ABNORMAL HIGH (ref 20–29)
Calcium: 9.6 mg/dL (ref 8.7–10.3)
Chloride: 100 mmol/L (ref 96–106)
Creatinine, Ser: 1.53 mg/dL — ABNORMAL HIGH (ref 0.57–1.00)
GFR calc Af Amer: 34 mL/min/{1.73_m2} — ABNORMAL LOW (ref >59)
GFR calc non Af Amer: 29 mL/min/{1.73_m2} — ABNORMAL LOW (ref >59)
Glucose: 106 mg/dL — ABNORMAL HIGH (ref 65–99)
Potassium: 4.3 mmol/L (ref 3.5–5.2)
Sodium: 144 mmol/L (ref 134–144)

## 2018-05-25 MED ORDER — AMIODARONE HCL 200 MG PO TABS: 200.0000 mg | ORAL_TABLET | Freq: Every day | ORAL | 1 refills | Status: DC

## 2018-05-25 NOTE — Patient Instructions (Signed)
Medication Instructions:  Your physician has recommended you make the following change in your medication:   1. Begin taking Amiodarone, 200mg , 5 days per week instead of 7 days per week.  Labwork: You will have labs drawn today: CBC, BMP, LFT, and TSH   Testing/Procedures: None ordered.  Follow-Up: Your physician recommends that you schedule a follow-up appointment in:   6 months with [name]  Any Other Special Instructions Will Be Listed Below (If Applicable).     If you need a refill on your cardiac medications before your next appointment, please call your pharmacy.

## 2018-05-25 NOTE — Progress Notes (Signed)
Patient Care Team: Chriss Czar, MD as PCP - General (Family Medicine)   HPI  [name] is a 84 y.o. female Seen In followup for pacemaker implanted for tachybradycardia syndrome January 2202 complicated by atrial lead dislodgment. She had a new lead placed thereafter. She also has atrial fibrillation and is on amiodarone.   Date TSH LFTs Hgb  11/17  3.52 26+ 12(4/17)  6/19  5.53 25 8.9 (4/19)   She was hospitalized in [place] following a fall and pelvic fracture 4/19  She denies chest pain or shortness of breath.  She is walking with a walker.  Trace peripheral edema  Past Medical History:  Diagnosis Date  . Anemia   . Arthritis    "knees especially"  . Bradycardia   . Cardiomyopathy    presumed ischemic in the past with an EF of 35%. The most recent EF in 03-2008, however, was 55 -60%  . Chronic renal insufficiency   . Dementia    mild  . Dyslipidemia   . Glaucoma   . Hypertension   . Malleolar fracture   . Paroxysmal atrial fibrillation [facility])     Past Surgical History:  Procedure Laterality Date  . ABDOMINAL HYSTERECTOMY    . CARDIOVERSION  07/2011  . EYE SURGERY  2002   "blood clot behind eye"  . FRACTURE SURGERY  ~ 2007   RLE  . HEMORRHOID SURGERY    . INSERT / REPLACE / REMOVE PACEMAKER  09/17/11   initial placement  . INSERT / REPLACE / REMOVE PACEMAKER  09/18/11  . PACEMAKER REVISION N/A 09/18/2011   Procedure: PACEMAKER REVISION;  Surgeon: Thompson Grayer, MD;  Location: [facility] C[facility] LAB;  Service: Cardiovascular;  Laterality: N/A;  . PERMANENT PACEMAKER INSERTION N/A 09/17/2011   Procedure: PERMANENT PACEMAKER INSERTION;  Surgeon: Deboraha Sprang, MD;  Location: [facility] CATH LAB;  Service: Cardiovascular;  Laterality: N/A;  . TONSILLECTOMY AND ADENOIDECTOMY      Current Outpatient Medications  Medication Sig Dispense Refill  . amiodarone (PACERONE) 200 MG tablet TAKE 1 TABLET(200 MG) BY MOUTH DAILY 90 tablet 1  . atorvastatin (LIPITOR) 10 MG tablet TAKE  1/2 TABLET BY MOUTH EVERY NIGHT AT BEDTIME 45 tablet 1  . brimonidine (ALPHAGAN) 0.2 % ophthalmic solution Place 1 drop into both eyes daily.    . furosemide (LASIX) 80 MG tablet TAKE 1 TABLET(80 MG) BY MOUTH DAILY 90 tablet 3  . lisinopril (PRINIVIL,ZESTRIL) 5 MG tablet TAKE 1 TABLET(5 MG) BY MOUTH DAILY 90 tablet 2  . metoprolol tartrate (LOPRESSOR) 50 MG tablet TAKE 1 AND 1/2 TABLETS(75 MG) BY MOUTH TWICE DAILY 90 tablet 10  . Multiple Vitamin (MULTIVITAMIN) tablet Take 1 tablet by mouth daily.      . Olopatadine HCl (PATADAY) 0.2 % SOLN Place 1 drop into both eyes daily.     . potassium chloride SA (K-DUR,KLOR-CON) 20 MEQ tablet TAKE 1 TABLET BY MOUTH EVERY DAY 90 tablet 2  . TRAVATAN Z 0.004 % SOLN ophthalmic solution Place 1 drop into both eyes at bedtime.  3  . warfarin (COUMADIN) 2 MG tablet Take 1/2 to 1 tablet daily as directed by coumadin clinic 90 tablet 0   No current facility-administered medications for this visit.     No Known Allergies  Review of Systems negative except from HPI and PMH  Physical Exam BP 120/78   Pulse 67   Ht 5' (1.524 m)   Wt 129 lb (58.5 kg)   SpO2  97%   BMI 25.19 kg/m  Well developed and nourished in no acute distress HENT normal Neck supple with JVP-flat Clear Regular rate and rhythm, no murmurs or gallops Abd-soft with active BS No Clubbing cyanosis edema Skin-warm and dry A & Oriented  Grossly normal sensory and motor function Device pocket well healed; without hematoma or erythema.  There is no tethering     ECG atrial pacing at 67 Intervals 20/19/48 Left bundle branch block   Assessment and  Plan   Atrial fibrillation--persistent    Sinus node dysfunction  LBBB  Pacemaker- Medtronic  The patient's device was interrogated.  The information was reviewed. No changes were made in the programming.     Anemia   Elevated TSH  Heart rate excursion is adequate.  We will check hemoglobin today.  Recheck TSH as it was  borderline elevated.  Continue current medications  Reviewed labs from [place]  We spent more than 50% of our >25 min visit in face to face counseling regarding the above

## 2018-06-02 ENCOUNTER — Ambulatory Visit: Payer: Medicare Other | Admitting: Cardiology

## 2018-06-05 ENCOUNTER — Other Ambulatory Visit: Payer: Self-pay | Admitting: Cardiology

## 2018-06-06 ENCOUNTER — Other Ambulatory Visit: Payer: Self-pay | Admitting: Cardiology

## 2018-06-06 ENCOUNTER — Ambulatory Visit (INDEPENDENT_AMBULATORY_CARE_PROVIDER_SITE_OTHER): Payer: Medicare PPO | Admitting: Cardiology

## 2018-06-06 DIAGNOSIS — I4891 Unspecified atrial fibrillation: Secondary | ICD-10-CM

## 2018-06-06 DIAGNOSIS — Z5181 Encounter for therapeutic drug level monitoring: Secondary | ICD-10-CM | POA: Diagnosis not present

## 2018-06-06 LAB — POCT INR: INR: 2.1 (ref 2.0–3.0)

## 2018-06-06 MED ORDER — METOPROLOL TARTRATE 50 MG PO TABS: ORAL_TABLET | ORAL | 10 refills | Status: DC

## 2018-06-06 NOTE — Telephone Encounter (Signed)
°*  STAT* If patient is at the pharmacy, call can be transferred to refill team.   1. Which medications need to be refilled? (please list name of each medication and dose if known) metoprolol  Tart tarte 50 mg 2. Which pharmacy/location (including street and city if local pharmacy) is medication to be sent to? [facility].   3. Do they need a 30 day or 90 day supply? Not sure but she thinks its 90 days.

## 2018-06-29 ENCOUNTER — Ambulatory Visit (INDEPENDENT_AMBULATORY_CARE_PROVIDER_SITE_OTHER): Payer: Medicare PPO | Admitting: Cardiovascular Disease

## 2018-06-29 DIAGNOSIS — Z5181 Encounter for therapeutic drug level monitoring: Secondary | ICD-10-CM | POA: Diagnosis not present

## 2018-06-29 DIAGNOSIS — I4891 Unspecified atrial fibrillation: Secondary | ICD-10-CM

## 2018-06-29 LAB — POCT INR: INR: 1.8 — AB (ref 2.0–3.0)

## 2018-07-11 ENCOUNTER — Telehealth: Payer: Self-pay | Admitting: Cardiology

## 2018-07-11 ENCOUNTER — Encounter: Payer: Medicare PPO | Admitting: *Deleted

## 2018-07-11 NOTE — Telephone Encounter (Signed)
Spoke with pt and reminded pt of remote transmission that is due today. Pt verbalized understanding.   

## 2018-07-12 ENCOUNTER — Encounter: Payer: Self-pay | Admitting: Cardiology

## 2018-07-13 ENCOUNTER — Ambulatory Visit (INDEPENDENT_AMBULATORY_CARE_PROVIDER_SITE_OTHER): Payer: Medicare PPO | Admitting: Cardiology

## 2018-07-13 DIAGNOSIS — Z5181 Encounter for therapeutic drug level monitoring: Secondary | ICD-10-CM

## 2018-07-13 DIAGNOSIS — I4891 Unspecified atrial fibrillation: Secondary | ICD-10-CM

## 2018-07-13 LAB — POCT INR: INR: 2.5 (ref 2.0–3.0)

## 2018-07-13 NOTE — Progress Notes (Signed)
HPI The patient presents after followup of had tachybradycardia syndrome.  Since I last saw her she has had no new complaints.  She has some joint problems.  She saw Dr. Caryl Comes and he did reduce her amiodarone to 5 times per week.  She is had no palpitations, presyncope or syncope.  She has no chest pressure, neck or arm discomfort.  She said no weight gain or edema.  She gets around with her walker.    No Known Allergies  Current Outpatient Medications  Medication Sig Dispense Refill  . amiodarone (PACERONE) 200 MG tablet Take 1 tablet (200 mg total) by mouth daily. Take 1 tab (200mg ) 5 days per week. 90 tablet 1  . atorvastatin (LIPITOR) 10 MG tablet TAKE 1/2 TABLET BY MOUTH EVERY NIGHT AT BEDTIME 45 tablet 1  . brimonidine (ALPHAGAN) 0.2 % ophthalmic solution Place 1 drop into both eyes daily.    . furosemide (LASIX) 80 MG tablet TAKE 1 TABLET(80 MG) BY MOUTH DAILY 90 tablet 3  . lisinopril (PRINIVIL,ZESTRIL) 5 MG tablet TAKE 1 TABLET(5 MG) BY MOUTH DAILY 90 tablet 2  . metoprolol tartrate (LOPRESSOR) 50 MG tablet TAKE 1 AND 1/2 TABLETS(75 MG) BY MOUTH TWICE DAILY 90 tablet 10  . Multiple Vitamin (MULTIVITAMIN) tablet Take 1 tablet by mouth daily.      . Olopatadine HCl (PATADAY) 0.2 % SOLN Place 1 drop into both eyes daily.     . potassium chloride SA (K-DUR,KLOR-CON) 20 MEQ tablet TAKE 1 TABLET BY MOUTH EVERY DAY 90 tablet 2  . TRAVATAN Z 0.004 % SOLN ophthalmic solution Place 1 drop into both eyes at bedtime.  3  . warfarin (COUMADIN) 2 MG tablet Take 1/2 to 1 tablet daily as directed by coumadin clinic 90 tablet 0   No current facility-administered medications for this visit.     Past Medical History:  Diagnosis Date  . Anemia   . Arthritis    "knees especially"  . Bradycardia   . Cardiomyopathy    presumed ischemic in the past with an EF of 35%. The most recent EF in 03-2008, however, was 55 -60%  . Chronic renal insufficiency   . Dementia ([place])    mild  .  Dyslipidemia   . Glaucoma   . Hypertension   . Malleolar fracture   . Paroxysmal atrial fibrillation [facility])     Past Surgical History:  Procedure Laterality Date  . ABDOMINAL HYSTERECTOMY    . CARDIOVERSION  07/2011  . EYE SURGERY  2002   "blood clot behind eye"  . FRACTURE SURGERY  ~ 2007   RLE  . HEMORRHOID SURGERY    . INSERT / REPLACE / REMOVE PACEMAKER  09/17/11   initial placement  . INSERT / REPLACE / REMOVE PACEMAKER  09/18/11  . PACEMAKER REVISION N/A 09/18/2011   Procedure: PACEMAKER REVISION;  Surgeon: Thompson Grayer, MD;  Location: [facility] CATH LAB;  Service: Cardiovascular;  Laterality: N/A;  . PERMANENT PACEMAKER INSERTION N/A 09/17/2011   Procedure: PERMANENT PACEMAKER INSERTION;  Surgeon: Deboraha Sprang, MD;  Location: [facility] CATH LAB;  Service: Cardiovascular;  Laterality: N/A;  . TONSILLECTOMY AND ADENOIDECTOMY      ROS:    As stated in the HPI and negative for all other systems.   PHYSICAL EXAM BP 134/80   Pulse 81   Ht 5' (1.524 m)   Wt 128 lb 12.8 oz (58.4 kg)   BMI 25.15 kg/m   PHYSICAL EXAM GEN:  No distress NECK:  No jugular venous distention at 90 degrees, waveform within normal limits, carotid upstroke brisk and symmetric, no bruits, no thyromegaly LUNGS:  Clear to auscultation bilaterally CHEST:  Well healed pacemaker pocket. HEART:  S1 and S2 within normal limits, no S3, no S4, no clicks, no rubs, no murmurs ABD:  Positive bowel sounds normal in frequency in pitch, no bruits, no rebound, no guarding, unable to assess midline mass or bruit with the patient seated. EXT:  2 plus pulses throughout, trace ankle edema, no cyanosis no clubbing and time   Lab Results  Component Value Date   TSH 5.580 (H) 05/25/2018   ALT 24 05/25/2018   AST 25 05/25/2018   ALKPHOS 78 05/25/2018   BILITOT 1.0 05/25/2018   PROT 6.3 05/25/2018   ALBUMIN 4.2 05/25/2018   EKG:  NA  ASSESSMENT AND PLAN   CARDIOMYOPATHY -  She is euvolemic.  No change in  therapy.  HYPERTENSION -  The blood pressure is at target.  No change in therapy.   Tachycardia-bradycardia syndrome -  She is up to date with device follow up.  She is up-to-date with labs.  She is on a lower dose of amiodarone and I agree with this.  No change in therapy.

## 2018-07-13 NOTE — Patient Instructions (Signed)
Description   Spoke Chrys Racer Nurse with [facility] and instructed to continue same dose of coumadin  2mg  daily except 1mg  on Mondays.  Recheck INR in 3 weeks.  Call our office if you have any concerns [phone]. Please call if you decide to do dental extractions.

## 2018-07-15 ENCOUNTER — Ambulatory Visit (INDEPENDENT_AMBULATORY_CARE_PROVIDER_SITE_OTHER): Payer: Medicare PPO | Admitting: Cardiology

## 2018-07-15 ENCOUNTER — Encounter: Payer: Self-pay | Admitting: Cardiology

## 2018-07-15 VITALS — BP 134/80 | HR 81 | Ht 60.0 in | Wt 128.8 lb

## 2018-07-15 DIAGNOSIS — I428 Other cardiomyopathies: Secondary | ICD-10-CM | POA: Diagnosis not present

## 2018-07-15 DIAGNOSIS — I10 Essential (primary) hypertension: Secondary | ICD-10-CM

## 2018-07-15 DIAGNOSIS — I495 Sick sinus syndrome: Secondary | ICD-10-CM

## 2018-07-15 NOTE — Patient Instructions (Signed)
Medication Instructions:  Continue current medications  If you need a refill on your cardiac medications before your next appointment, please call your pharmacy.  Labwork: None Ordered   If you have labs (blood work) drawn today and your tests are completely normal, you will receive your results only by: Marland Kitchen MyChart Message (if you have MyChart) OR . A paper copy in the mail If you have any lab test that is abnormal or we need to change your treatment, we will call you to review the results.  Testing/Procedures: None Ordered  Follow-Up: . You will need a follow up appointment in 4 Months with Dr Percival Spanish.    At [facility], you and your health needs are our priority.  As part of our continuing mission to provide you with exceptional heart care, we have created designated Provider Care Teams.  These Care Teams include your primary Cardiologist (physician) and Advanced Practice Providers (APPs -  Physician Assistants and Nurse Practitioners) who all work together to provide you with the care you need, when you need it.   Thank you for choosing [facility] at [facility]!!

## 2018-08-03 ENCOUNTER — Ambulatory Visit (INDEPENDENT_AMBULATORY_CARE_PROVIDER_SITE_OTHER): Payer: Medicare PPO

## 2018-08-03 DIAGNOSIS — I4891 Unspecified atrial fibrillation: Secondary | ICD-10-CM

## 2018-08-03 DIAGNOSIS — Z5181 Encounter for therapeutic drug level monitoring: Secondary | ICD-10-CM

## 2018-08-03 LAB — POCT INR: INR: 2 (ref 2.0–3.0)

## 2018-08-08 NOTE — Progress Notes (Signed)
This encounter was created in error - please disregard.

## 2018-08-12 ENCOUNTER — Other Ambulatory Visit: Payer: Self-pay | Admitting: Cardiology

## 2018-08-25 ENCOUNTER — Ambulatory Visit (INDEPENDENT_AMBULATORY_CARE_PROVIDER_SITE_OTHER): Payer: Medicare PPO | Admitting: Cardiovascular Disease

## 2018-08-25 DIAGNOSIS — I4891 Unspecified atrial fibrillation: Secondary | ICD-10-CM | POA: Diagnosis not present

## 2018-08-25 DIAGNOSIS — Z5181 Encounter for therapeutic drug level monitoring: Secondary | ICD-10-CM | POA: Diagnosis not present

## 2018-09-09 ENCOUNTER — Ambulatory Visit (INDEPENDENT_AMBULATORY_CARE_PROVIDER_SITE_OTHER): Payer: Medicare PPO | Admitting: Cardiovascular Disease

## 2018-09-09 DIAGNOSIS — Z5181 Encounter for therapeutic drug level monitoring: Secondary | ICD-10-CM

## 2018-09-09 DIAGNOSIS — I4891 Unspecified atrial fibrillation: Secondary | ICD-10-CM

## 2018-09-09 LAB — POCT INR: INR: 3.8 — AB (ref 2.0–3.0)

## 2018-09-20 ENCOUNTER — Ambulatory Visit (INDEPENDENT_AMBULATORY_CARE_PROVIDER_SITE_OTHER): Payer: Medicare PPO

## 2018-09-20 DIAGNOSIS — I4891 Unspecified atrial fibrillation: Secondary | ICD-10-CM

## 2018-09-20 DIAGNOSIS — Z5181 Encounter for therapeutic drug level monitoring: Secondary | ICD-10-CM | POA: Diagnosis not present

## 2018-09-20 LAB — POCT INR: INR: 2.6 (ref 2.0–3.0)

## 2018-09-20 NOTE — Patient Instructions (Signed)
Description   Spoke Chrys Racer Nurse with [facility] while in pt's home, advised to have pt continue on same dosage 2mg  daily.  Recheck INR in 2 weeks.  Call our office if you have any concerns [phone]. Please call if you decide to do dental extractions.

## 2018-10-02 ENCOUNTER — Other Ambulatory Visit: Payer: Self-pay | Admitting: Cardiology

## 2018-10-03 NOTE — Telephone Encounter (Signed)
Rx has been sent to the pharmacy electronically. ° °

## 2018-10-12 ENCOUNTER — Other Ambulatory Visit (INDEPENDENT_AMBULATORY_CARE_PROVIDER_SITE_OTHER): Payer: Medicare PPO

## 2018-10-12 DIAGNOSIS — I4891 Unspecified atrial fibrillation: Secondary | ICD-10-CM

## 2018-10-12 DIAGNOSIS — I495 Sick sinus syndrome: Secondary | ICD-10-CM

## 2018-10-17 ENCOUNTER — Ambulatory Visit (INDEPENDENT_AMBULATORY_CARE_PROVIDER_SITE_OTHER): Payer: Medicare PPO

## 2018-10-17 DIAGNOSIS — I428 Other cardiomyopathies: Secondary | ICD-10-CM

## 2018-10-17 DIAGNOSIS — I495 Sick sinus syndrome: Secondary | ICD-10-CM

## 2018-10-19 ENCOUNTER — Ambulatory Visit (INDEPENDENT_AMBULATORY_CARE_PROVIDER_SITE_OTHER): Payer: Medicare PPO

## 2018-10-19 DIAGNOSIS — Z5181 Encounter for therapeutic drug level monitoring: Secondary | ICD-10-CM

## 2018-10-19 DIAGNOSIS — I4891 Unspecified atrial fibrillation: Secondary | ICD-10-CM

## 2018-10-19 LAB — CUP PACEART REMOTE DEVICE CHECK
Battery Impedance: 538 Ohm
Battery Remaining Longevity: 82 mo
Battery Voltage: 2.78 V
Brady Statistic AP VP Percent: 0 %
Brady Statistic AP VS Percent: 99 %
Brady Statistic AS VP Percent: 0 %
Brady Statistic AS VS Percent: 0 %
Date Time Interrogation Session: 20200202195702
Implantable Lead Implant Date: 20130103
Implantable Lead Implant Date: 20130103
Implantable Lead Location: 753859
Implantable Lead Location: 753860
Implantable Lead Model: 1944
Implantable Lead Model: 1948
Implantable Pulse Generator Implant Date: 20130103
Lead Channel Impedance Value: 391 Ohm
Lead Channel Impedance Value: 655 Ohm
Lead Channel Pacing Threshold Amplitude: 0.375 V
Lead Channel Pacing Threshold Amplitude: 0.625 V
Lead Channel Pacing Threshold Pulse Width: 0.4 ms
Lead Channel Pacing Threshold Pulse Width: 0.4 ms
Lead Channel Setting Pacing Amplitude: 2 V
Lead Channel Setting Pacing Amplitude: 2.5 V
Lead Channel Setting Pacing Pulse Width: 0.4 ms
Lead Channel Setting Sensing Sensitivity: 5.6 mV

## 2018-10-19 LAB — POCT INR: INR: 2.1 (ref 2.0–3.0)

## 2018-10-19 NOTE — Patient Instructions (Signed)
Description   Continue on same dosage 2mg  daily.  Recheck INR in 2 weeks.  Call our office if you have any concerns [phone]. Please call if you decide to do dental extractions.

## 2018-10-25 NOTE — Progress Notes (Signed)
Remote pacemaker transmission.   

## 2018-11-02 ENCOUNTER — Encounter: Payer: Self-pay | Admitting: Cardiology

## 2018-11-20 NOTE — Progress Notes (Deleted)
HPI The patient presents after followup of had tachybradycardia syndrome.  Since I last saw her ***  she has had no new complaints.  She has some joint problems.  She saw Dr. Caryl Comes and he did reduce her amiodarone to 5 times per week.  She is had no palpitations, presyncope or syncope.  She has no chest pressure, neck or arm discomfort.  She said no weight gain or edema.  She gets around with her walker.    No Known Allergies  Current Outpatient Medications  Medication Sig Dispense Refill  . amiodarone (PACERONE) 200 MG tablet Take 1 tablet (200 mg total) by mouth daily. Take 1 tab (200mg ) 5 days per week. 90 tablet 1  . atorvastatin (LIPITOR) 10 MG tablet TAKE 1/2 TABLET BY MOUTH EVERY NIGHT AT BEDTIME 45 tablet 3  . brimonidine (ALPHAGAN) 0.2 % ophthalmic solution Place 1 drop into both eyes daily.    . furosemide (LASIX) 80 MG tablet TAKE 1 TABLET(80 MG) BY MOUTH DAILY 90 tablet 3  . lisinopril (PRINIVIL,ZESTRIL) 5 MG tablet TAKE 1 TABLET(5 MG) BY MOUTH DAILY 90 tablet 2  . metoprolol tartrate (LOPRESSOR) 50 MG tablet TAKE 1 AND 1/2 TABLETS(75 MG) BY MOUTH TWICE DAILY 90 tablet 10  . Multiple Vitamin (MULTIVITAMIN) tablet Take 1 tablet by mouth daily.      . Olopatadine HCl (PATADAY) 0.2 % SOLN Place 1 drop into both eyes daily.     . potassium chloride SA (K-DUR,KLOR-CON) 20 MEQ tablet TAKE 1 TABLET BY MOUTH EVERY DAY 90 tablet 2  . TRAVATAN Z 0.004 % SOLN ophthalmic solution Place 1 drop into both eyes at bedtime.  3  . warfarin (COUMADIN) 2 MG tablet TAKE 1/2 TO 1 TABLET BY MOUTH DAILY AS DIRECTED BY COUMADIN CLINIC 90 tablet 1   No current facility-administered medications for this visit.     Past Medical History:  Diagnosis Date  . Anemia   . Arthritis    "knees especially"  . Bradycardia   . Cardiomyopathy    presumed ischemic in the past with an EF of 35%. The most recent EF in 03-2008, however, was 55 -60%  . Chronic renal insufficiency   . Dementia ([place])    mild  . Dyslipidemia   . Glaucoma   . Hypertension   . Malleolar fracture   . Paroxysmal atrial fibrillation [facility])     Past Surgical History:  Procedure Laterality Date  . ABDOMINAL HYSTERECTOMY    . CARDIOVERSION  07/2011  . EYE SURGERY  2002   "blood clot behind eye"  . FRACTURE SURGERY  ~ 2007   RLE  . HEMORRHOID SURGERY    . INSERT / REPLACE / REMOVE PACEMAKER  09/17/11   initial placement  . INSERT / REPLACE / REMOVE PACEMAKER  09/18/11  . PACEMAKER REVISION N/A 09/18/2011   Procedure: PACEMAKER REVISION;  Surgeon: Thompson Grayer, MD;  Location: [facility] CATH LAB;  Service: Cardiovascular;  Laterality: N/A;  . PERMANENT PACEMAKER INSERTION N/A 09/17/2011   Procedure: PERMANENT PACEMAKER INSERTION;  Surgeon: Deboraha Sprang, MD;  Location: [facility] CATH LAB;  Service: Cardiovascular;  Laterality: N/A;  . TONSILLECTOMY AND ADENOIDECTOMY      ROS:    As stated in the HPI and negative for all other systems.   PHYSICAL EXAM There were no vitals taken for this visit.  PHYSICAL EXAM GEN:  No distress NECK:  No jugular venous distention at 90 degrees, waveform within normal limits, carotid  upstroke brisk and symmetric, no bruits, no thyromegaly LUNGS:  Clear to auscultation bilaterally CHEST:  Well healed pacemaker pocket. HEART:  S1 and S2 within normal limits, no S3, no S4, no clicks, no rubs, no murmurs ABD:  Positive bowel sounds normal in frequency in pitch, no bruits, no rebound, no guarding, unable to assess midline mass or bruit with the patient seated. EXT:  2 plus pulses throughout, trace ankle edema, no cyanosis no clubbing and time   Lab Results  Component Value Date   TSH 5.580 (H) 05/25/2018   ALT 24 05/25/2018   AST 25 05/25/2018   ALKPHOS 78 05/25/2018   BILITOT 1.0 05/25/2018   PROT 6.3 05/25/2018   ALBUMIN 4.2 05/25/2018     EKG:  ***  ASSESSMENT AND PLAN   CARDIOMYOPATHY -  ***  She is euvolemic.  No change in therapy.  HYPERTENSION -  The blood pressure is ***  at target.  No change in therapy.   Tachycardia-bradycardia syndrome -  She is up to date with device follow up. ***   She is up-to-date with labs.  She is on a lower dose of amiodarone and I agree with this.  No change in therapy.

## 2018-11-21 ENCOUNTER — Ambulatory Visit: Payer: Medicare PPO | Admitting: Cardiology

## 2018-11-22 ENCOUNTER — Ambulatory Visit: Payer: Medicare PPO | Admitting: Cardiology

## 2018-11-23 ENCOUNTER — Other Ambulatory Visit: Payer: Self-pay

## 2018-11-23 ENCOUNTER — Ambulatory Visit (INDEPENDENT_AMBULATORY_CARE_PROVIDER_SITE_OTHER): Payer: Medicare Other | Admitting: *Deleted

## 2018-11-23 DIAGNOSIS — Z5181 Encounter for therapeutic drug level monitoring: Secondary | ICD-10-CM | POA: Diagnosis not present

## 2018-11-23 DIAGNOSIS — I4891 Unspecified atrial fibrillation: Secondary | ICD-10-CM

## 2018-11-23 LAB — POCT INR: INR: 3.1 — AB (ref 2.0–3.0)

## 2018-11-23 NOTE — Patient Instructions (Signed)
Description   Today take 1mg  then continue on same dosage 2mg  daily.  Recheck INR in 2 weeks with MD appt.  Call our office if you have any concerns [phone]. Please call if you decide to do dental extractions.

## 2018-11-29 ENCOUNTER — Other Ambulatory Visit: Payer: Self-pay | Admitting: Cardiology

## 2018-11-29 ENCOUNTER — Encounter: Payer: Medicare PPO | Admitting: Physician Assistant

## 2018-12-01 ENCOUNTER — Telehealth: Payer: Self-pay

## 2018-12-01 ENCOUNTER — Telehealth: Payer: Self-pay | Admitting: Cardiology

## 2018-12-01 NOTE — Telephone Encounter (Signed)
Called pt to assess needs. She states she is doing well. She denies SOB, edema, palpitations or CP. Her last remote check was normal. She states she does not need any prescription refills at this time. She has agreed to postpone her appt with Dr Caryl Comes. She understands scheduling will call her in the future to reschedule. She understands she may call the office with any concerns or needs in the meantime.

## 2018-12-01 NOTE — Telephone Encounter (Signed)
I do not think that it is a good idea during this pandemic to encourage elective home PT

## 2018-12-01 NOTE — Telephone Encounter (Addendum)
Spoke to daughter she is aware patient needs to keep appointment with coumadin clinic

## 2018-12-01 NOTE — Telephone Encounter (Signed)
New Message:    Pt's daughter called and wanted to know if you would sen an order for pt's PT. She would like this, so pt will not have to come in the office. Please send order to [facility] in [facility].

## 2018-12-02 ENCOUNTER — Encounter: Payer: Medicare PPO | Admitting: Physician Assistant

## 2018-12-06 ENCOUNTER — Telehealth: Payer: Self-pay

## 2018-12-06 ENCOUNTER — Ambulatory Visit: Payer: Medicare PPO | Admitting: Cardiology

## 2018-12-06 NOTE — Telephone Encounter (Signed)
[place]

## 2018-12-07 NOTE — Telephone Encounter (Signed)

## 2018-12-08 ENCOUNTER — Other Ambulatory Visit: Payer: Self-pay

## 2018-12-08 ENCOUNTER — Encounter: Payer: Medicare PPO | Admitting: Internal Medicine

## 2018-12-08 ENCOUNTER — Ambulatory Visit (INDEPENDENT_AMBULATORY_CARE_PROVIDER_SITE_OTHER): Payer: Medicare Other | Admitting: Pharmacist

## 2018-12-08 DIAGNOSIS — I4891 Unspecified atrial fibrillation: Secondary | ICD-10-CM | POA: Diagnosis not present

## 2018-12-08 DIAGNOSIS — Z5181 Encounter for therapeutic drug level monitoring: Secondary | ICD-10-CM

## 2018-12-08 LAB — POCT INR: INR: 2.7 (ref 2.0–3.0)

## 2018-12-18 ENCOUNTER — Other Ambulatory Visit: Payer: Self-pay | Admitting: Cardiology

## 2018-12-23 ENCOUNTER — Telehealth: Payer: Self-pay

## 2018-12-23 NOTE — Telephone Encounter (Signed)
LEFT MESSAGE FOR PT TO RETURN CALL TO OFFICE FOR VIRTUAL CALL AT 4PM

## 2018-12-23 NOTE — Telephone Encounter (Signed)
Virtual Visit Pre-Appointment Phone Call  Steps For Call: [place] PHONE   1. Confirm consent - "In the setting of the current Covid19 crisis, you are scheduled for a (phone or video) visit with your provider on (date) at (time).  Just as we do with many in-office visits, in order for you to participate in this visit, we must obtain consent.  If you'd like, I can send this to your mychart (if signed up) or email for you to review.  Otherwise, I can obtain your verbal consent now.  All virtual visits are billed to your insurance company just like a normal visit would be.  By agreeing to a virtual visit, we'd like you to understand that the technology does not allow for your provider to perform an examination, and thus may limit your provider's ability to fully assess your condition.  Finally, though the technology is pretty good, we cannot assure that it will always work on either your or our end, and in the setting of a video visit, we may have to convert it to a phone-only visit.  In either situation, we cannot ensure that we have a secure connection.  Are you willing to proceed?"  2. Give patient instructions for WebEx download to smartphone as below if video visit  3. Advise patient to be prepared with any vital sign or heart rhythm information, their current medicines, and a piece of paper and pen handy for any instructions they may receive the day of their visit  4. Inform patient they will receive a phone call 15 minutes prior to their appointment time (may be from unknown caller ID) so they should be prepared to answer  5. Confirm that appointment type is correct in Epic appointment notes (video vs telephone)    TELEPHONE CALL NOTE  [name] has been deemed a candidate for a follow-up tele-health visit to limit community exposure during the Covid-19 pandemic. I spoke with the patient via phone to ensure availability of phone/video source, confirm preferred email & phone  number, and discuss instructions and expectations.  I reminded [name] to be prepared with any vital sign and/or heart rhythm information that could potentially be obtained via home monitoring, at the time of her visit. I reminded [name] to expect a phone call at the time of her visit if her visit.  Did the patient verbally acknowledge consent to treatment?   Zebedee Iba, CMA 12/23/2018 1:49 PM   DOWNLOADING THE [place]  - If Apple, go to [employer] and type in WebEx in the search bar. [employer], the blue/[place]leigh [place]queira circle. The app is free but as with any other app downloads, their phone may require them to verify saved payment information or Apple password. The patient does NOT have to create an account.  - If Android, ask patient to go to [employer] and type in WebEx in the search bar. [employer], the blue/Alitza Cowman circle. The app is free but as with any other app downloads, their phone may require them to verify saved payment information or Android password. The patient does NOT have to create an account.   CONSENT FOR TELE-HEALTH VISIT - PLEASE REVIEW  I hereby voluntarily request, consent and authorize [facility] and its employed or contracted physicians, physician assistants, nurse practitioners or other licensed health care professionals (the Practitioner), to provide me with telemedicine health care services (the "Services") as deemed necessary by the  treating Practitioner. I acknowledge and consent to receive the Services by the Practitioner via telemedicine. I understand that the telemedicine visit will involve communicating with the Practitioner through live audiovisual communication technology and the disclosure of certain medical information by electronic transmission. I acknowledge that I have been given the opportunity to request an in-person assessment or other available alternative prior to the telemedicine  visit and am voluntarily participating in the telemedicine visit.  I understand that I have the right to withhold or withdraw my consent to the use of telemedicine in the course of my care at any time, without affecting my right to future care or treatment, and that the Practitioner or I may terminate the telemedicine visit at any time. I understand that I have the right to inspect all information obtained and/or recorded in the course of the telemedicine visit and may receive copies of available information for a reasonable fee.  I understand that some of the potential risks of receiving the Services via telemedicine include:  Marland Kitchen Delay or interruption in medical evaluation due to technological equipment failure or disruption; . Information transmitted may not be sufficient (e.g. poor resolution of images) to allow for appropriate medical decision making by the Practitioner; and/or  . In rare instances, security protocols could fail, causing a breach of personal health information.  Furthermore, I acknowledge that it is my responsibility to provide information about my medical history, conditions and care that is complete and accurate to the best of my ability. I acknowledge that Practitioner's advice, recommendations, and/or decision may be based on factors not within their control, such as incomplete or inaccurate data provided by me or distortions of diagnostic images or specimens that may result from electronic transmissions. I understand that the practice of medicine is not an exact science and that Practitioner makes no warranties or guarantees regarding treatment outcomes. I acknowledge that I will receive a copy of this consent concurrently upon execution via email to the email address I last provided but may also request a printed copy by calling the office of [place].    I understand that my insurance will be billed for this visit.   I have read or had this consent read to me. . I  understand the contents of this consent, which adequately explains the benefits and risks of the Services being provided via telemedicine.  . I have been provided ample opportunity to ask questions regarding this consent and the Services and have had my questions answered to my satisfaction. . I give my informed consent for the services to be provided through the use of telemedicine in my medical care  By participating in this telemedicine visit I agree to the above.

## 2018-12-26 NOTE — Telephone Encounter (Signed)
Called pt.

## 2018-12-28 ENCOUNTER — Telehealth: Payer: Self-pay

## 2018-12-28 NOTE — Telephone Encounter (Signed)
lmom for prescreen  

## 2019-01-02 ENCOUNTER — Telehealth: Payer: Self-pay

## 2019-01-02 NOTE — Telephone Encounter (Signed)
lmom for prescreen  

## 2019-01-03 ENCOUNTER — Other Ambulatory Visit: Payer: Self-pay

## 2019-01-03 ENCOUNTER — Telehealth: Payer: Self-pay | Admitting: Cardiology

## 2019-01-03 ENCOUNTER — Ambulatory Visit (INDEPENDENT_AMBULATORY_CARE_PROVIDER_SITE_OTHER): Payer: Medicare Other | Admitting: Pharmacist

## 2019-01-03 DIAGNOSIS — Z5181 Encounter for therapeutic drug level monitoring: Secondary | ICD-10-CM | POA: Diagnosis not present

## 2019-01-03 DIAGNOSIS — I4891 Unspecified atrial fibrillation: Secondary | ICD-10-CM

## 2019-01-03 LAB — POCT INR: INR: 2.7 (ref 2.0–3.0)

## 2019-01-03 NOTE — Telephone Encounter (Signed)
New Message:   Patient daughter calling concering her mothers results. Please call back.

## 2019-01-03 NOTE — Telephone Encounter (Signed)
Already addressed in separate anticoag encounter.

## 2019-01-09 NOTE — Progress Notes (Signed)
Virtual Visit via Telephone Note   This visit type was conducted due to national recommendations for restrictions regarding the COVID-19 Pandemic (e.g. social distancing) in an effort to limit this patient's exposure and mitigate transmission in our community.  Due to her co-morbid illnesses, this patient is at least at moderate risk for complications without adequate follow up.  This format is felt to be most appropriate for this patient at this time.  The patient did not have access to video technology/had technical difficulties with video requiring transitioning to audio format only (telephone).  All issues noted in this document were discussed and addressed.  No physical exam could be performed with this format.  Please refer to the patient's chart for her  consent to telehealth for [facility].   Evaluation Performed:  Follow-up visit  Date:  01/10/2019   ID:  [name], DOB 01-15-1923, MRN [id number]  Patient Location: Home Provider Location: Home  PCP:  Chriss Czar, MD  Cardiologist:  Minus Breeding, MD  Electrophysiologist:  None   Chief Complaint:  SOB  History of Present Illness:    [name] is a 85 y.o. female who presents for follow up of tachybrady syndrome.  Since I last saw her she has done well.  The patient denies any new symptoms such as chest discomfort, neck or arm discomfort. There has been no new shortness of breath, PND or orthopnea. There have been no reported palpitations, presyncope or syncope.  She has been staying in and staying safe  The patient does not have symptoms concerning for COVID-19 infection (fever, chills, cough, or new shortness of breath).    Past Medical History:  Diagnosis Date  . Anemia   . Arthritis    "knees especially"  . Bradycardia   . Cardiomyopathy    presumed ischemic in the past with an EF of 35%. The most recent EF in 03-2008, however, was 55 -60%  . Chronic renal insufficiency   . Dementia ([place])    mild  .  Dyslipidemia   . Glaucoma   . Hypertension   . Malleolar fracture   . Paroxysmal atrial fibrillation [facility])    Past Surgical History:  Procedure Laterality Date  . ABDOMINAL HYSTERECTOMY    . CARDIOVERSION  07/2011  . EYE SURGERY  2002   "blood clot behind eye"  . FRACTURE SURGERY  ~ 2007   RLE  . HEMORRHOID SURGERY    . INSERT / REPLACE / REMOVE PACEMAKER  09/17/11   initial placement  . INSERT / REPLACE / REMOVE PACEMAKER  09/18/11  . PACEMAKER REVISION N/A 09/18/2011   Procedure: PACEMAKER REVISION;  Surgeon: Thompson Grayer, MD;  Location[facility] LAB;  Service: Cardiovascular;  Laterality: N/A;  . PERMANENT PACEMAKER INSERTION N/A 09/17/2011   Procedure: PERMANENT PACEMAKER INSERTION;  Surgeon: Deboraha Sprang, MD;  Location: [facility] CATH LAB;  Service: Cardiovascular;  Laterality: N/A;  . TONSILLECTOMY AND ADENOIDECTOMY       Current Meds  Medication Sig  . amiodarone (PACERONE) 200 MG tablet Take 1 tablet (200 mg total) by mouth daily. Take 1 tab (200mg ) 5 days per week.  Marland Kitchen atorvastatin (LIPITOR) 10 MG tablet TAKE 1/2 TABLET BY MOUTH EVERY NIGHT AT BEDTIME  . brimonidine (ALPHAGAN) 0.2 % ophthalmic solution Place 1 drop into both eyes daily.  . furosemide (LASIX) 80 MG tablet TAKE 1 TABLET(80 MG) BY MOUTH DAILY  . lisinopril (PRINIVIL,ZESTRIL) 5 MG tablet TAKE 1 TABLET(5 MG) BY MOUTH DAILY  . metoprolol  tartrate (LOPRESSOR) 50 MG tablet TAKE 1 AND 1/2 TABLETS(75 MG) BY MOUTH TWICE DAILY  . Multiple Vitamin (MULTIVITAMIN) tablet Take 1 tablet by mouth daily.    . Olopatadine HCl (PATADAY) 0.2 % SOLN Place 1 drop into both eyes daily.   . potassium chloride SA (K-DUR,KLOR-CON) 20 MEQ tablet TAKE 1 TABLET BY MOUTH EVERY DAY  . TRAVATAN Z 0.004 % SOLN ophthalmic solution Place 1 drop into both eyes at bedtime.  Marland Kitchen warfarin (COUMADIN) 2 MG tablet TAKE 1/2 TO 1 TABLET BY MOUTH DAILY AS DIRECTED BY COUMADIN CLINIC     Allergies:   Patient has no known allergies.   Social History   Tobacco  Use  . Smoking status: Never Smoker  . Smokeless tobacco: Never Used  Substance Use Topics  . Alcohol use: No  . Drug use: No     Family Hx: The patient's family history includes Other in an other family member.  ROS:   Please see the history of present illness.    As stated in the HPI and negative for all other systems.   Prior CV studies:   The following studies were reviewed today:    Labs/Other Tests and Data Reviewed:    EKG:  No ECG reviewed.  Recent Labs: 05/25/2018: ALT 24; BUN 41; Creatinine, Ser 1.53; Hemoglobin 11.1; Platelets 178; Potassium 4.3; Sodium 144; TSH 5.580   Recent Lipid Panel Lab Results  Component Value Date/Time   CHOL 135 10/31/2015 12:02 PM   TRIG 55 10/31/2015 12:02 PM   HDL 96 10/31/2015 12:02 PM   CHOLHDL 1.4 10/31/2015 12:02 PM   [place] 28 10/31/2015 12:02 PM    Wt Readings from Last 3 Encounters:  01/10/19 126 lb (57.2 kg)  07/15/18 128 lb 12.8 oz (58.4 kg)  05/25/18 129 lb (58.5 kg)     Objective:    Vital Signs:  BP 136/79   Pulse 64   Ht 5' (1.524 m)   Wt 126 lb (57.2 kg)   BMI 24.61 kg/m    VITAL SIGNS:  reviewed NA  ASSESSMENT & PLAN:    CARDIOMYOPATHY -  By history she is euvolemic.  No change in therapy.    HYPERTENSION -  The blood pressure is at target. No change in medications is indicated. We will continue with therapeutic lifestyle changes (TLC).  Tachycardia-bradycardia syndrome -  She is not having palpitations.  No change in therapy.  Call Ms. Bobbye Charleston with the results and send results to Chriss Czar, MD   [id number] Education: The signs and symptoms of COVID-19 were discussed with the patient and how to seek care for testing (follow up with PCP or arrange E-visit).  The importance of social distancing was discussed today.  Time:   Today, I have spent 23minutes with the patient with telehealth technology discussing the above problems.     Medication Adjustments/Labs and Tests Ordered:  Current medicines are reviewed at length with the patient today.  Concerns regarding medicines are outlined above.   Tests Ordered: No orders of the defined types were placed in this encounter.   Medication Changes: No orders of the defined types were placed in this encounter.   Disposition:  Follow up in three months.   Signed, Minus Breeding, MD  01/10/2019 1:56 PM    [place]

## 2019-01-10 ENCOUNTER — Telehealth (INDEPENDENT_AMBULATORY_CARE_PROVIDER_SITE_OTHER): Payer: Medicare Other | Admitting: Cardiology

## 2019-01-10 ENCOUNTER — Encounter: Payer: Self-pay | Admitting: Cardiology

## 2019-01-10 VITALS — BP 136/79 | HR 64 | Ht 60.0 in | Wt 126.0 lb

## 2019-01-10 DIAGNOSIS — I10 Essential (primary) hypertension: Secondary | ICD-10-CM

## 2019-01-10 DIAGNOSIS — I428 Other cardiomyopathies: Secondary | ICD-10-CM

## 2019-01-10 DIAGNOSIS — I4891 Unspecified atrial fibrillation: Secondary | ICD-10-CM

## 2019-01-10 NOTE — Patient Instructions (Addendum)
Medication Instructions:  Continue current medications  If you need a refill on your cardiac medications before your next appointment, please call your pharmacy.  Labwork: None Ordered   Testing/Procedures: None Ordered   Follow-Up: You will need a follow up appointment in 3 months.  Please call our office 2 months in advance to schedule this appointment.  You may see James Hochrein, MD or one of the following Advanced Practice Providers on your designated Care Team:   Rhonda Barrett, PA-C . Kathryn Lawrence, DNP, ANP     At [facility], you and your health needs are our priority.  As part of our continuing mission to provide you with exceptional heart care, we have created designated Provider Care Teams.  These Care Teams include your primary Cardiologist (physician) and Advanced Practice Providers (APPs -  Physician Assistants and Nurse Practitioners) who all work together to provide you with the care you need, when you need it.  Thank you for choosing [facility] at [facility]!!     

## 2019-01-13 ENCOUNTER — Other Ambulatory Visit: Payer: Self-pay

## 2019-01-13 ENCOUNTER — Telehealth: Payer: Self-pay | Admitting: Internal Medicine

## 2019-01-13 ENCOUNTER — Telehealth (INDEPENDENT_AMBULATORY_CARE_PROVIDER_SITE_OTHER): Payer: Medicare Other | Admitting: Internal Medicine

## 2019-01-13 ENCOUNTER — Telehealth: Payer: Self-pay | Admitting: Cardiology

## 2019-01-13 VITALS — BP 127/80 | HR 60 | Ht 60.0 in | Wt 129.0 lb

## 2019-01-13 DIAGNOSIS — Z950 Presence of cardiac pacemaker: Secondary | ICD-10-CM

## 2019-01-13 DIAGNOSIS — I4891 Unspecified atrial fibrillation: Secondary | ICD-10-CM

## 2019-01-13 DIAGNOSIS — I428 Other cardiomyopathies: Secondary | ICD-10-CM

## 2019-01-13 DIAGNOSIS — I495 Sick sinus syndrome: Secondary | ICD-10-CM

## 2019-01-13 MED ORDER — AMIODARONE HCL 200 MG PO TABS: 100.0000 mg | ORAL_TABLET | Freq: Every day | ORAL | 1 refills | Status: DC

## 2019-01-13 NOTE — Telephone Encounter (Signed)
Pharmacy calling to clarify medication change: Amiodarone, 100mg  half tablet, 5 days per week.

## 2019-01-13 NOTE — Telephone Encounter (Signed)
New Message:    Pharmacy concerning some medications on a patient. Please call back.

## 2019-01-13 NOTE — Telephone Encounter (Signed)
Sure

## 2019-01-13 NOTE — Progress Notes (Signed)
Electrophysiology TeleHealth Note   Due to national recommendations of social distancing due to COVID 19, an audio/video telehealth visit is felt to be most appropriate for this patient at this time.  See MyChart message from today for the patient's consent to telehealth for [facility].   Date:  01/13/2019   ID:  [name], DOB March 15, 1923, MRN [id number]  Location: patient's home  Provider location: [address] [place]  Evaluation Performed: Follow-up visit  PCP:  Chriss Czar, MD  Cardiologist:   [facility] Electrophysiologist:  SK   Chief Complaint:  Atrial fibrillation and sinus bradycardia  History of Present Illness:    [name] is a 85 y.o. female who presents via audio/video conferencing for a telehealth visit today.  Since last being seen in our clinic for atrial fibrillation for which she takes amiodarone and warfarin,  the patient reports doing well  The patient denies chest pain, shortness of breath, nocturnal dyspnea, orthopnea or peripheral edema.  There have been no palpitations, lightheadedness or syncope.   Patient denies symptoms of GI intolerance, sun sensitivity, neurological symptoms attributable to amiodarone.  o   Date TSH LFTs Hgb  11/17  3.52 26+ 12(4/17)  6/19  5.53 25 8.9 (4/19)  9/19 5.58 25 11.1      The patient denies symptoms of fevers, chills, cough, or new SOB worrisome for COVID 19. *  Past Medical History:  Diagnosis Date  . Anemia   . Arthritis    "knees especially"  . Bradycardia   . Cardiomyopathy    presumed ischemic in the past with an EF of 35%. The most recent EF in 03-2008, however, was 55 -60%  . Chronic renal insufficiency   . Dementia ([place])    mild  . Dyslipidemia   . Glaucoma   . Hypertension   . Malleolar fracture   . Paroxysmal atrial fibrillation [facility])     Past Surgical History:  Procedure Laterality Date  . ABDOMINAL HYSTERECTOMY    . CARDIOVERSION  07/2011  . EYE SURGERY  2002    "blood clot behind eye"  . FRACTURE SURGERY  ~ 2007   RLE  . HEMORRHOID SURGERY    . INSERT / REPLACE / REMOVE PACEMAKER  09/17/11   initial placement  . INSERT / REPLACE / REMOVE PACEMAKER  09/18/11  . PACEMAKER REVISION N/A 09/18/2011   Procedure: PACEMAKER REVISION;  Surgeon: Thompson Grayer, MD;  Location: [facility] CATH LAB;  Service: Cardiovascular;  Laterality: N/A;  . PERMANENT PACEMAKER INSERTION N/A 09/17/2011   Procedure: PERMANENT PACEMAKER INSERTION;  Surgeon: Deboraha Sprang, MD;  Location: [facility] CATH LAB;  Service: Cardiovascular;  Laterality: N/A;  . TONSILLECTOMY AND ADENOIDECTOMY      Current Outpatient Medications  Medication Sig Dispense Refill  . amiodarone (PACERONE) 200 MG tablet Take 1 tablet (200 mg total) by mouth daily. Take 1 tab (200mg ) 5 days per week. 90 tablet 1  . atorvastatin (LIPITOR) 10 MG tablet TAKE 1/2 TABLET BY MOUTH EVERY NIGHT AT BEDTIME 45 tablet 3  . brimonidine (ALPHAGAN) 0.2 % ophthalmic solution Place 1 drop into both eyes daily.    . furosemide (LASIX) 80 MG tablet TAKE 1 TABLET(80 MG) BY MOUTH DAILY 90 tablet 3  . lisinopril (PRINIVIL,ZESTRIL) 5 MG tablet TAKE 1 TABLET(5 MG) BY MOUTH DAILY 90 tablet 0  . metoprolol tartrate (LOPRESSOR) 50 MG tablet TAKE 1 AND 1/2 TABLETS(75 MG) BY MOUTH TWICE DAILY 90 tablet 10  . Multiple Vitamin (  MULTIVITAMIN) tablet Take 1 tablet by mouth daily.      . Olopatadine HCl (PATADAY) 0.2 % SOLN Place 1 drop into both eyes daily.     . potassium chloride SA (K-DUR,KLOR-CON) 20 MEQ tablet TAKE 1 TABLET BY MOUTH EVERY DAY 90 tablet 1  . TRAVATAN Z 0.004 % SOLN ophthalmic solution Place 1 drop into both eyes at bedtime.  3  . warfarin (COUMADIN) 2 MG tablet TAKE 1/2 TO 1 TABLET BY MOUTH DAILY AS DIRECTED BY COUMADIN CLINIC 90 tablet 1   No current facility-administered medications for this visit.     Allergies:   Patient has no known allergies.   Social History:  The patient  reports that she has never smoked. She has never used  smokeless tobacco. She reports that she does not drink alcohol or use drugs.   Family History:  The patient's   family history includes Other in an other family member.   ROS:  Please see the history of present illness.   All other systems are personally reviewed and negative.    Exam:    Vital Signs:  BP 127/80   Pulse 60   Ht 5' (1.524 m)   Wt 129 lb (58.5 kg)   BMI 25.19 kg/m     Labs/Other Tests and Data Reviewed:    Recent Labs: 05/25/2018: ALT 24; BUN 41; Creatinine, Ser 1.53; Hemoglobin 11.1; Platelets 178; Potassium 4.3; Sodium 144; TSH 5.580  Personally reviewed    Wt Readings from Last 3 Encounters:  01/13/19 129 lb (58.5 kg)  01/10/19 126 lb (57.2 kg)  07/15/18 128 lb 12.8 oz (58.4 kg)     Other studies personally reviewed: Additional studies/ records that were reviewed today include:As above     Last device remote is reviewed from [facility] PDF dated 2/20  which reveals normal device function,   arrhythmias - non sustained atrial fib   ASSESSMENT & PLAN:    Atrial fibrillation--persistent    Sinus node dysfunction  LBBB  Hypertension  Pacemaker- [employer]      Anemia   Elevated TSH   Will need recheck of TSH, CBC   Decrease amiodarone to 100 mg daily  No amio intolerance symptoms  On Anticoagulation;  No bleeding issues   BP well controlled     COVID 19 screen The patient denies symptoms of COVID 19 at this time.  The importance of social distancing was discussed today.  Follow-up:  1 year Next remote: As Scheduled   Current medicines are reviewed at length with the patient today.   The patient does not have concerns regarding her medicines.  The following changes were made today:  none  Labs/ tests ordered today include: As above  No orders of the defined types were placed in this encounter.   Future tests ( post COVID )     Patient Risk:  after full review of this patients clinical status, I feel that they are at moderate  risk at this time.  Today, I have spent 5  minutes with the patient with telehealth technology discussing the above.  Signed, Virl Axe, MD  01/13/2019 9:22 AM     [facility] [address] [address] [phone] (office) [phone] (fax)

## 2019-01-13 NOTE — Telephone Encounter (Signed)
° ° °  Pt c/o medication issue:  1. Name of Medication: amiodarone (PACERONE) 200 MG tablet  2. How are you currently taking this medication (dosage and times per day)?  3. Are you having a reaction (difficulty breathing--STAT)? no  4. What is your medication issue? Patient wants to discuss changes with Dr Percival Spanish that Dr Caryl Comes made

## 2019-01-13 NOTE — Telephone Encounter (Signed)
Patient daughter called stating that her mother had tele visit with Dr.Klein today and he cut amiodarone to 1/2 tablet for 5 days, and she wanted to make sure that was okay.   Routed to MD.

## 2019-01-16 NOTE — Telephone Encounter (Signed)
Called patient daughter, LVM that Dr.Hochrein is aware of change, advised to call back if questions.

## 2019-01-16 NOTE — Telephone Encounter (Signed)
Pt's daughter wanted to make sure pt was to only take Amiodarone 5 days per week .Daughter aware this is correct ./cy

## 2019-01-16 NOTE — Telephone Encounter (Signed)
° °  Please return call to daughter, she wants to know if medication should only be taken 5 days.

## 2019-01-19 ENCOUNTER — Telehealth: Payer: Self-pay | Admitting: Internal Medicine

## 2019-01-19 NOTE — Telephone Encounter (Signed)
Pt daughter states that [employer] is sending her a new monitor and it will take 7-10 business days to receive the monitor. She also states that she was wrong about the pt weight. The pt weight is 127 lbs. The pt daughter would like Lorren to give her a call back because she needs to know what to do about mom

## 2019-01-19 NOTE — Telephone Encounter (Signed)
Spoke with pt's daughter, [name]. She has agreed to increase her mother's Amiodarone to 200mg , qd, starting with 100mg  this evening (which would total 200mg  for the day.) She understands I will contact her after I speak with the device team regarding interrogation of her mother's device.   Pts daughter states she did not give the extra half dose of lasix today as her mother stated she had already been urinating a lot. She is down to 127lbs today. I advised her to weigh her in the AM, if she is down in her weight and feeling better, she does not need to give the extra half dose. If pt is up in weight and feeling SOB, she may give the extra half dose in the morning.

## 2019-01-19 NOTE — Telephone Encounter (Signed)
Pt daughter states she was trying to send a transmission with the pt home monitor but received the error code [id number] and [id number]. I gave her the number to [place] support to get additional help. Pt daughter agreed to call tech support.

## 2019-01-19 NOTE — Telephone Encounter (Signed)
Spoke with pt and her daughter. Pt is having trouble laying down and feels SOB. She has some increased fatigue. She has had some weight gain of about 3lbs in the last 3 days. Pt states this correlates to around the same time her Amiodarone was decreased to 100mg  qd x 5days/wk.   I advised pt and her daughter to send in a manual transmission to determine if she is currently in AF after her amiodarone dose change.   Pt will take an extra 40mg , half tablet, of furosemide to ease symptoms of fluid overload as well.   Forwarding to device clinic to review pt's remote transmission, and Dr Caryl Comes for review.

## 2019-01-19 NOTE — Telephone Encounter (Signed)
New Message   Pt c/o medication issue:  1. Name of Medication: amiodarone (PACERONE) 200 MG tablet    2. How are you currently taking this medication (dosage and times per day)?   3. Are you having a reaction (difficulty breathing--STAT)?   4. What is your medication issue? Patients daughter called on her behalf. She states that her mother told her that she is not feeling well at all and not sleeping well. The mother mentioned to the daughter that she is not breathing well. The daughter is about 45 minutes away from the mom.

## 2019-01-19 NOTE — Telephone Encounter (Signed)
Lets increase amio to 200 daily and arrange for in office interrogation next week to see whether in fact she is in afib  If so would anticpate urgent DCCV

## 2019-01-20 ENCOUNTER — Emergency Department (HOSPITAL_COMMUNITY): Payer: Medicare Other

## 2019-01-20 ENCOUNTER — Encounter (HOSPITAL_COMMUNITY): Payer: Self-pay

## 2019-01-20 ENCOUNTER — Inpatient Hospital Stay (HOSPITAL_COMMUNITY)
Admission: EM | Admit: 2019-01-20 | Discharge: 2019-01-23 | DRG: 291 | Disposition: A | Discharge status: Home Health | Payer: Medicare Other | Attending: Internal Medicine | Admitting: Internal Medicine

## 2019-01-20 ENCOUNTER — Other Ambulatory Visit: Payer: Self-pay

## 2019-01-20 DIAGNOSIS — F0390 Unspecified dementia without behavioral disturbance: Secondary | ICD-10-CM | POA: Diagnosis present

## 2019-01-20 DIAGNOSIS — E876 Hypokalemia: Secondary | ICD-10-CM | POA: Diagnosis present

## 2019-01-20 DIAGNOSIS — Z7901 Long term (current) use of anticoagulants: Secondary | ICD-10-CM

## 2019-01-20 DIAGNOSIS — I5043 Acute on chronic combined systolic (congestive) and diastolic (congestive) heart failure: Secondary | ICD-10-CM | POA: Diagnosis present

## 2019-01-20 DIAGNOSIS — T502X5A Adverse effect of carbonic-anhydrase inhibitors, benzothiadiazides and other diuretics, initial encounter: Secondary | ICD-10-CM | POA: Diagnosis present

## 2019-01-20 DIAGNOSIS — H409 Unspecified glaucoma: Secondary | ICD-10-CM | POA: Diagnosis present

## 2019-01-20 DIAGNOSIS — E785 Hyperlipidemia, unspecified: Secondary | ICD-10-CM | POA: Diagnosis present

## 2019-01-20 DIAGNOSIS — I5033 Acute on chronic diastolic (congestive) heart failure: Secondary | ICD-10-CM | POA: Diagnosis not present

## 2019-01-20 DIAGNOSIS — Z90710 Acquired absence of both cervix and uterus: Secondary | ICD-10-CM

## 2019-01-20 DIAGNOSIS — I447 Left bundle-branch block, unspecified: Secondary | ICD-10-CM | POA: Diagnosis present

## 2019-01-20 DIAGNOSIS — I480 Paroxysmal atrial fibrillation: Secondary | ICD-10-CM | POA: Diagnosis present

## 2019-01-20 DIAGNOSIS — Z79899 Other long term (current) drug therapy: Secondary | ICD-10-CM

## 2019-01-20 DIAGNOSIS — N183 Chronic kidney disease, stage 3 unspecified: Secondary | ICD-10-CM | POA: Diagnosis present

## 2019-01-20 DIAGNOSIS — I130 Hypertensive heart and chronic kidney disease with heart failure and stage 1 through stage 4 chronic kidney disease, or unspecified chronic kidney disease: Principal | ICD-10-CM | POA: Diagnosis present

## 2019-01-20 DIAGNOSIS — I429 Cardiomyopathy, unspecified: Secondary | ICD-10-CM

## 2019-01-20 DIAGNOSIS — H9190 Unspecified hearing loss, unspecified ear: Secondary | ICD-10-CM | POA: Diagnosis present

## 2019-01-20 DIAGNOSIS — I10 Essential (primary) hypertension: Secondary | ICD-10-CM | POA: Diagnosis present

## 2019-01-20 DIAGNOSIS — I509 Heart failure, unspecified: Secondary | ICD-10-CM

## 2019-01-20 DIAGNOSIS — R0600 Dyspnea, unspecified: Secondary | ICD-10-CM

## 2019-01-20 DIAGNOSIS — I5023 Acute on chronic systolic (congestive) heart failure: Secondary | ICD-10-CM

## 2019-01-20 DIAGNOSIS — T501X5A Adverse effect of loop [high-ceiling] diuretics, initial encounter: Secondary | ICD-10-CM | POA: Diagnosis present

## 2019-01-20 DIAGNOSIS — Z20828 Contact with and (suspected) exposure to other viral communicable diseases: Secondary | ICD-10-CM | POA: Diagnosis present

## 2019-01-20 DIAGNOSIS — Z950 Presence of cardiac pacemaker: Secondary | ICD-10-CM

## 2019-01-20 DIAGNOSIS — I495 Sick sinus syndrome: Secondary | ICD-10-CM

## 2019-01-20 LAB — BASIC METABOLIC PANEL
Anion gap: 16 — ABNORMAL HIGH (ref 5–15)
BUN: 32 mg/dL — ABNORMAL HIGH (ref 8–23)
CO2: 24 mmol/L (ref 22–32)
Calcium: 9.2 mg/dL (ref 8.9–10.3)
Chloride: 103 mmol/L (ref 98–111)
Creatinine, Ser: 1.56 mg/dL — ABNORMAL HIGH (ref 0.44–1.00)
GFR calc Af Amer: 33 mL/min — ABNORMAL LOW (ref >60)
GFR calc non Af Amer: 28 mL/min — ABNORMAL LOW (ref >60)
Glucose, Bld: 98 mg/dL (ref 70–99)
Potassium: 3.5 mmol/L (ref 3.5–5.1)
Sodium: 143 mmol/L (ref 135–145)

## 2019-01-20 LAB — CBC WITH DIFFERENTIAL/PLATELET
Abs Immature Granulocytes: 0.03 10*3/uL (ref 0.00–0.07)
Basophils Absolute: 0 10*3/uL (ref 0.0–0.1)
Basophils Relative: 0 %
Eosinophils Absolute: 0 10*3/uL (ref 0.0–0.5)
Eosinophils Relative: 0 %
HCT: 33.8 % — ABNORMAL LOW (ref 36.0–46.0)
Hemoglobin: 10.7 g/dL — ABNORMAL LOW (ref 12.0–15.0)
Immature Granulocytes: 0 %
Lymphocytes Relative: 8 %
Lymphs Abs: 0.8 10*3/uL (ref 0.7–4.0)
MCH: 31.9 pg (ref 26.0–34.0)
MCHC: 31.7 g/dL (ref 30.0–36.0)
MCV: 100.9 fL — ABNORMAL HIGH (ref 80.0–100.0)
Monocytes Absolute: 0.6 10*3/uL (ref 0.1–1.0)
Monocytes Relative: 6 %
Neutro Abs: 8.5 10*3/uL — ABNORMAL HIGH (ref 1.7–7.7)
Neutrophils Relative %: 86 %
Platelets: 164 10*3/uL (ref 150–400)
RBC: 3.35 MIL/uL — ABNORMAL LOW (ref 3.87–5.11)
RDW: 13.5 % (ref 11.5–15.5)
WBC: 9.9 10*3/uL (ref 4.0–10.5)
nRBC: 0 % (ref 0.0–0.2)

## 2019-01-20 LAB — PROTIME-INR
INR: 2.6 — ABNORMAL HIGH (ref 0.8–1.2)
Prothrombin Time: 27.7 seconds — ABNORMAL HIGH (ref 11.4–15.2)

## 2019-01-20 LAB — BRAIN NATRIURETIC PEPTIDE: B Natriuretic Peptide: 2283.5 pg/mL — ABNORMAL HIGH (ref 0.0–100.0)

## 2019-01-20 LAB — SARS CORONAVIRUS 2 BY RT PCR (HOSPITAL ORDER, PERFORMED IN ~~LOC~~ HOSPITAL LAB): SARS Coronavirus 2: NEGATIVE

## 2019-01-20 LAB — TROPONIN I: Troponin I: <0.03 ng/mL (ref <0.03)

## 2019-01-20 MED ORDER — FUROSEMIDE 10 MG/ML IJ SOLN
40.0000 mg | Freq: Once | INTRAMUSCULAR | Status: AC
  Administered 2019-01-20: 40 mg via INTRAVENOUS
  Filled 2019-01-20: qty 4

## 2019-01-20 MED ORDER — METOPROLOL TARTRATE 50 MG PO TABS
75.0000 mg | ORAL_TABLET | Freq: Two times a day (BID) | ORAL | Status: DC
  Administered 2019-01-20 – 2019-01-23 (×6): 75 mg via ORAL
  Filled 2019-01-20 (×6): qty 1

## 2019-01-20 MED ORDER — BRIMONIDINE TARTRATE 0.2 % OP SOLN
1.0000 [drp] | Freq: Every day | OPHTHALMIC | Status: DC
  Administered 2019-01-21 – 2019-01-23 (×3): 1 [drp] via OPHTHALMIC
  Filled 2019-01-20: qty 5

## 2019-01-20 MED ORDER — ACETAMINOPHEN 325 MG PO TABS
650.0000 mg | ORAL_TABLET | Freq: Four times a day (QID) | ORAL | Status: DC | PRN
  Administered 2019-01-21: 650 mg via ORAL
  Filled 2019-01-20: qty 2

## 2019-01-20 MED ORDER — OLOPATADINE HCL 0.1 % OP SOLN
1.0000 [drp] | Freq: Two times a day (BID) | OPHTHALMIC | Status: DC
  Administered 2019-01-20 – 2019-01-23 (×6): 1 [drp] via OPHTHALMIC
  Filled 2019-01-20: qty 5

## 2019-01-20 MED ORDER — ONDANSETRON HCL 4 MG PO TABS: 4.0000 mg | ORAL_TABLET | Freq: Four times a day (QID) | ORAL | Status: DC | PRN

## 2019-01-20 MED ORDER — ATORVASTATIN CALCIUM 10 MG PO TABS
5.0000 mg | ORAL_TABLET | Freq: Every day | ORAL | Status: DC
  Administered 2019-01-20 – 2019-01-22 (×3): 5 mg via ORAL
  Filled 2019-01-20 (×3): qty 1

## 2019-01-20 MED ORDER — ONDANSETRON HCL 4 MG/2ML IJ SOLN: 4.0000 mg | Freq: Four times a day (QID) | INTRAMUSCULAR | Status: DC | PRN

## 2019-01-20 MED ORDER — SENNOSIDES-DOCUSATE SODIUM 8.6-50 MG PO TABS: 1.0000 | ORAL_TABLET | Freq: Every evening | ORAL | Status: DC | PRN

## 2019-01-20 MED ORDER — WARFARIN - PHARMACIST DOSING INPATIENT: Freq: Every day | Status: DC

## 2019-01-20 MED ORDER — AMIODARONE HCL 200 MG PO TABS
200.0000 mg | ORAL_TABLET | Freq: Every day | ORAL | Status: DC
  Administered 2019-01-21 – 2019-01-23 (×3): 200 mg via ORAL
  Filled 2019-01-20 (×3): qty 1

## 2019-01-20 MED ORDER — FUROSEMIDE 10 MG/ML IJ SOLN
40.0000 mg | Freq: Two times a day (BID) | INTRAMUSCULAR | Status: DC
  Administered 2019-01-21: 40 mg via INTRAVENOUS
  Filled 2019-01-20: qty 4

## 2019-01-20 MED ORDER — ACETAMINOPHEN 650 MG RE SUPP: 650.0000 mg | Freq: Four times a day (QID) | RECTAL | Status: DC | PRN

## 2019-01-20 MED ORDER — LATANOPROST 0.005 % OP SOLN
1.0000 [drp] | Freq: Every day | OPHTHALMIC | Status: DC
  Administered 2019-01-20 – 2019-01-22 (×3): 1 [drp] via OPHTHALMIC
  Filled 2019-01-20: qty 2.5

## 2019-01-20 NOTE — ED Notes (Signed)
Daughter: Darryll Capers [phone]

## 2019-01-20 NOTE — Progress Notes (Addendum)
ANTICOAGULATION CONSULT NOTE - Initial Consult  Pharmacy Consult for Warfarin Indication: atrial fibrillation  No Known Allergies  Patient Measurements: Height: 5\' 5"  (165.1 cm) Weight: 123 lb (55.8 kg) IBW/kg (Calculated) : 57  Vital Signs: Temp: 97.5 F (36.4 C) (05/08 1359) Temp Source: Oral (05/08 1359) BP: 112/78 (05/08 1945) Pulse Rate: 74 (05/08 1945)  Labs: Recent Labs    01/20/19 1407  HGB 10.7*  HCT 33.8*  PLT 164  LABPROT 27.7*  INR 2.6*  CREATININE 1.56*  TROPONINI <0.03    Estimated Creatinine Clearance: 19.8 mL/min (A) (by C-G formula based on SCr of 1.56 mg/dL (H)).   Medical History: Past Medical History:  Diagnosis Date  . Anemia   . Arthritis    "knees especially"  . Bradycardia   . Cardiomyopathy    presumed ischemic in the past with an EF of 35%. The most recent EF in 03-2008, however, was 55 -60%  . Chronic renal insufficiency   . Dementia ([place])    mild  . Dyslipidemia   . Glaucoma   . Hypertension   . Malleolar fracture   . Paroxysmal atrial fibrillation [facility])     Assessment:  85 year old female admitted for acute on chronic systolic CHF and renal insufficiency on CKD stage III. Pt with hx of a fib, treated with warfarin, continuing warfarin on admission (last dose of warfarin was at 0830 AM on 01/20/19, per family), INR therapeutic (2.6).  Goal of Therapy:  INR 2-3   Plan:  Initiate inpatient warfarin regimen on Saturday, 01/21/19 (pt already rec'd dose today, with therapeutic INR on admission) Check daily INR; monitor H/H  Gillermina Hu, PharmD, BCPS, [facility] Clinical Pharmacist 01/20/2019,8:03 PM

## 2019-01-20 NOTE — Telephone Encounter (Addendum)
Spoke with patient's daughter. Pt is currently at the ED due to [facility]. Per daughter, EMTs did EKG and thought that PPM was "misfiring." Advised that if the ED MD feels a PPM interrogation is warranted during admission, they will order one. Otherwise, will check in clinic on 5/13 as previously scheduled.  Advised I have discussed with coumadin clinic--moved up her appointment for INR and labs from 5/19 to 5/13 to minimize trips to the office. Pt's daughter verbalizes understanding and thanked me for my assistance.

## 2019-01-20 NOTE — ED Provider Notes (Signed)
[place] [facility] EMERGENCY DEPARTMENT Provider Note   CSN: [id number] Arrival date & time: 01/20/19  1342    History   Chief Complaint Chief Complaint  Patient presents with  . Shortness of Breath    HPI [name] is a 85 y.o. female.     85 year old female with prior medical history as detailed below presents for evaluation of shortness of breath.  Patient is reporting that she has had symptoms for the last 3 to 4 days.  She denies fever or cough.  She reports mild dyspnea.  She is not O2 dependent at home.  She denies associated chest pain, cough, nausea, vomiting, or other acute complaint.  She denies known sick contacts.   Of note, patient does have a history of mild dementia.  She also has a prior history of atrial fibrillation and is currently taking both Coumadin and amiodarone.  The history is provided by the patient and medical records.  Shortness of Breath  Severity:  Mild Onset quality:  Gradual Duration:  3 days Timing:  Constant Progression:  Unchanged Chronicity:  New Relieved by:  Nothing Worsened by:  Nothing Ineffective treatments:  None tried Associated symptoms: no chest pain and no fever     Past Medical History:  Diagnosis Date  . Anemia   . Arthritis    "knees especially"  . Bradycardia   . Cardiomyopathy    presumed ischemic in the past with an EF of 35%. The most recent EF in 03-2008, however, was 55 -60%  . Chronic renal insufficiency   . Dementia ([place])    mild  . Dyslipidemia   . Glaucoma   . Hypertension   . Malleolar fracture   . Paroxysmal atrial fibrillation [facility])     Patient Active Problem List   Diagnosis Date Noted  . Acute on chronic systolic HF (heart failure) ([place]) 03/04/2018  . PAF (paroxysmal atrial fibrillation) ([place]) 04/19/2017  . Chronic anticoagulation 12/15/2015  . Encounter for therapeutic drug monitoring 12/25/2013  . Pacemaker infection ([place]) 08/18/2012  . Pacemaker- [employer]tr[employer] 12/25/2011  .  Chronic renal insufficiency, stage III (moderate) ([place]) 03/12/2010  . Dyslipidemia 12/28/2007  . ANEMIA 12/28/2007  . DEMENTIA, MILD 12/28/2007  . GLAUCOMA ASSOCIATED W/UNSPEC OCULAR DISORDER 12/28/2007  . Essential hypertension 12/28/2007  . Cardiomyopathy EF 20% by echo [facility]) 12/28/2007  . Atrial fibrillation with RVR ([place]) 12/28/2007  . Sinus node dysfunction ([place]) 12/28/2007  . CHF 12/28/2007    Past Surgical History:  Procedure Laterality Date  . ABDOMINAL HYSTERECTOMY    . CARDIOVERSION  07/2011  . EYE SURGERY  2002   "blood clot behind eye"  . FRACTURE SURGERY  ~ 2007   RLE  . HEMORRHOID SURGERY    . INSERT / REPLACE / REMOVE PACEMAKER  09/17/11   initial placement  . INSERT / REPLACE / REMOVE PACEMAKER  09/18/11  . PACEMAKER REVISION N/A 09/18/2011   Procedure: PACEMAKER REVISION;  Surgeon: Thompson Grayer, MD;  [facility] C[facility] LAB;  Service: Cardiovascular;  Laterality: N/A;  . PERMANENT PACEMAKER INSERTION N/A 09/17/2011   Procedure: PERMANENT PACEMAKER INSERTION;  Surgeon: Deboraha Sprang, MD;  Location: [facility] CATH LAB;  Service: Cardiovascular;  Laterality: N/A;  . TONSILLECTOMY AND ADENOIDECTOMY       OB History   No obstetric history on file.      Home Medications    Prior to Admission medications   Medication Sig Start Date End Date Taking? Authorizing Provider  amiodarone (PACERONE) 200 MG  tablet Take 0.5 tablets (100 mg total) by mouth daily. Take 1 tab (200mg ) 5 days per week. 01/13/19   Deboraha Sprang, MD  atorvastatin (LIPITOR) 10 MG tablet TAKE 1/2 TABLET BY MOUTH EVERY NIGHT AT BEDTIME 10/03/18   Minus Breeding, MD  brimonidine (ALPHAGAN) 0.2 % ophthalmic solution Place 1 drop into both eyes daily. 04/02/16   [provider]  furosemide (LASIX) 80 MG tablet TAKE 1 TABLET(80 MG) BY MOUTH DAILY 03/24/18   Minus Breeding, MD  lisinopril (PRINIVIL,ZESTRIL) 5 MG tablet TAKE 1 TABLET(5 MG) BY MOUTH DAILY 11/30/18   Minus Breeding, MD  metoprolol tartrate  (LOPRESSOR) 50 MG tablet TAKE 1 AND 1/2 TABLETS(75 MG) BY MOUTH TWICE DAILY 06/06/18   Minus Breeding, MD  Multiple Vitamin (MULTIVITAMIN) tablet Take 1 tablet by mouth daily.      [provider]  Olopatadine HCl (PATADAY) 0.2 % SOLN Place 1 drop into both eyes daily.     [provider]  potassium chloride SA (K-DUR,KLOR-CON) 20 MEQ tablet TAKE 1 TABLET BY MOUTH EVERY DAY 12/19/18   Minus Breeding, MD  TRAVATAN Z 0.004 % SOLN ophthalmic solution Place 1 drop into both eyes at bedtime. 07/09/15   [provider]  warfarin (COUMADIN) 2 MG tablet TAKE 1/2 TO 1 TABLET BY MOUTH DAILY AS DIRECTED BY COUMADIN CLINIC 08/15/18   Minus Breeding, MD    Family History Family History  Problem Relation Age of Onset  . Other Other        Noncontributory    Social History Social History   Tobacco Use  . Smoking status: Never Smoker  . Smokeless tobacco: Never Used  Substance Use Topics  . Alcohol use: No  . Drug use: No     Allergies   Patient has no known allergies.   Review of Systems Review of Systems  Constitutional: Negative for fever.  Respiratory: Positive for shortness of breath.   Cardiovascular: Negative for chest pain.  All other systems reviewed and are negative.    Physical Exam Updated Vital Signs BP 119/89   Pulse 65   Temp (!) 97.5 F (36.4 C) (Oral)   Resp (!) 24   Ht 5\' 5"  (1.651 m)   Wt 55.8 kg   SpO2 96%   BMI 20.47 kg/m   Physical Exam Vitals signs and nursing note reviewed.  Constitutional:      General: She is not in acute distress.    Appearance: She is well-developed.  HENT:     Head: Normocephalic and atraumatic.  Eyes:     Conjunctiva/sclera: Conjunctivae normal.     Pupils: Pupils are equal, round, and reactive to light.  Neck:     Musculoskeletal: Normal range of motion and neck supple.  Cardiovascular:     Rate and Rhythm: Normal rate and regular rhythm.     Heart sounds: Normal heart sounds.  Pulmonary:      Effort: Pulmonary effort is normal. No respiratory distress.     Breath sounds: Normal breath sounds.  Abdominal:     General: There is no distension.     Palpations: Abdomen is soft.     Tenderness: There is no abdominal tenderness.  Musculoskeletal: Normal range of motion.        General: No deformity.  Skin:    General: Skin is warm and dry.  Neurological:     Mental Status: She is alert and oriented to person, place, and time.      ED Treatments /  Results  Labs (all labs ordered are listed, but only abnormal results are displayed) Labs Reviewed  BASIC METABOLIC PANEL  CBC WITH DIFFERENTIAL/PLATELET  BRAIN NATRIURETIC PEPTIDE  TROPONIN I  PROTIME-INR    EKG None  Radiology No results found.  Procedures Procedures (including critical care time)  Medications Ordered in ED Medications - No data to display   Initial Impression / Assessment and Plan / ED Course  I have reviewed the triage vital signs and the nursing notes.  Pertinent labs & imaging results that were available during my care of the patient were reviewed by me and considered in my medical decision making (see chart for details).       MDM  Screen complete  Towanda Hornstein was evaluated in Emergency Department on 01/20/2019 for the symptoms described in the history of present illness. She was evaluated in the context of the global COVID-19 pandemic, which necessitated consideration that the patient might be at risk for infection with the SARS-CoV-2 virus that causes COVID-19. Institutional protocols and algorithms that pertain to the evaluation of patients at risk for COVID-19 are in a state of rapid change based on information released by regulatory bodies including the CDC and federal and state organizations. These policies and algorithms were followed during the patient's care in the ED.  Patient is presenting with complaint of shortness of breath.  Patient without overt evidence on exam of  significant respiratory distress.  Room air pulse ox is in the 96 to 97% range.  Initial exam shows clear lungs.  We will obtain screening labs and screening chest x-ray to check for pathology such as pneumonia, congestive heart failure exacerbation, anemia, or other acute process.  Dr. Sherry Ruffing aware of pending studies and disposition.     Final Clinical Impressions(s) / ED Diagnoses   Final diagnoses:  Dyspnea, unspecified type    ED Discharge Orders    None       Valarie Merino, MD 01/20/19 1413

## 2019-01-20 NOTE — ED Triage Notes (Signed)
Pt states that she has felt SOB for three days, arrived to ED 96% on RA, afebrile, A/Ox4.

## 2019-01-20 NOTE — Telephone Encounter (Signed)
Spoke w/ paramedic Lyles w/ the [facility]. He wanted to know if pt device was programmed to fire at all times or specific zone. I informed paramedic that only when an arrhythmia is detected. He verbalized understanding.

## 2019-01-20 NOTE — ED Notes (Signed)
ED TO INPATIENT HANDOFF REPORT  ED Nurse Name and Phone #:  [phone] Patty, RN  S Name/Age/Gender [name] 85 y.o. female Room/Bed: 020C/020C  Code Status   Code Status: Full Code  Home/SNF/Other Home Patient oriented to: self, place, time and situation Is this baseline? Yes   Triage Complete: Triage complete  Chief Complaint sob  Triage Note Pt states that she has felt SOB for three days, arrived to ED 96% on RA, afebrile, A/Ox4.   Allergies No Known Allergies  Level of Care/Admitting Diagnosis ED Disposition    ED Disposition Condition [facility] Area: [place] [[id number]]  Level of Care: Telemetry Cardiac [103]  I expect the patient will be discharged within 24 hours: No (not a candidate for 5C-Observation unit)  Covid Evaluation: Screening Protocol (No Symptoms)  Diagnosis: Acute on chronic diastolic CHF (congestive heart failure) [facility]) [295621]  Admitting Physician: Lavina Hamman [[id number]]  Attending Physician: Lavina Hamman [[id number]]  PT Class (Do Not Modify): Observation [104]  PT Acc Code (Do Not Modify): Observation [10022]       B Medical/Surgery History Past Medical History:  Diagnosis Date  . Anemia   . Arthritis    "knees especially"  . Bradycardia   . Cardiomyopathy    presumed ischemic in the past with an EF of 35%. The most recent EF in 03-2008, however, was 55 -60%  . Chronic renal insufficiency   . Dementia ([place])    mild  . Dyslipidemia   . Glaucoma   . Hypertension   . Malleolar fracture   . Paroxysmal atrial fibrillation [facility])    Past Surgical History:  Procedure Laterality Date  . ABDOMINAL HYSTERECTOMY    . CARDIOVERSION  07/2011  . EYE SURGERY  2002   "blood clot behind eye"  . FRACTURE SURGERY  ~ 2007   RLE  . HEMORRHOID SURGERY    . INSERT / REPLACE / REMOVE PACEMAKER  09/17/11   initial placement  . INSERT / REPLACE / REMOVE PACEMAKER  09/18/11  . PACEMAKER REVISION N/A 09/18/2011   Procedure: PACEMAKER REVISION;  Surgeon: Thompson Grayer, MD;  Location[facility] CATH LAB;  Service: Cardiovascular;  Laterality: N/A;  . PERMANENT PACEMAKER INSERTION N/A 09/17/2011   Procedure: PERMANENT PACEMAKER INSERTION;  Surgeon: Deboraha Sprang, MD;  Location: [facility] CATH LAB;  Service: Cardiovascular;  Laterality: N/A;  . TONSILLECTOMY AND ADENOIDECTOMY       A IV Location/Drains/Wounds Patient Lines/Drains/Airways Status   Active Line/Drains/Airways    Name:   Placement date:   Placement time:   Site:   Days:   Peripheral IV 01/20/19 Left Antecubital   01/20/19    1404    Antecubital   less than 1   External Urinary Catheter   01/20/19    -    -   less than 1          Intake/Output Last 24 hours No intake or output data in the 24 hours ending 01/20/19 1950  Labs/Imaging Results for orders placed or performed during the hospital encounter of 01/20/19 (from the past 48 hour(s))  Basic metabolic panel     Status: Abnormal   Collection Time: 01/20/19  2:07 PM  Result Value Ref Range   Sodium 143 135 - 145 mmol/L   Potassium 3.5 3.5 - 5.1 mmol/L   Chloride 103 98 - 111 mmol/L   CO2 24 22 - 32 mmol/L   Glucose, Bld 98 70 -  99 mg/dL   BUN 32 (H) 8 - 23 mg/dL   Creatinine, Ser 1.56 (H) 0.44 - 1.00 mg/dL   Calcium 9.2 8.9 - 10.3 mg/dL   GFR calc non Af Amer 28 (L) >60 mL/min   GFR calc Af Amer 33 (L) >60 mL/min   Anion gap 16 (H) 5 - 15    Comment: Performed at [facility] [address], [place], [place] [address]  CBC with Differential     Status: Abnormal   Collection Time: 01/20/19  2:07 PM  Result Value Ref Range   WBC 9.9 4.0 - 10.5 K/uL   RBC 3.35 (L) 3.87 - 5.11 MIL/uL   Hemoglobin 10.7 (L) 12.0 - 15.0 g/dL   HCT 33.8 (L) 36.0 - 46.0 %   MCV 100.9 (H) 80.0 - 100.0 fL   MCH 31.9 26.0 - 34.0 pg   MCHC 31.7 30.0 - 36.0 g/dL   RDW 13.5 11.5 - 15.5 %   Platelets 164 150 - 400 K/uL   nRBC 0.0 0.0 - 0.2 %   Neutrophils Relative % 86 %   Neutro Abs 8.5 (H) 1.7 - 7.7 K/uL    Lymphocytes Relative 8 %   Lymphs Abs 0.8 0.7 - 4.0 K/uL   Monocytes Relative 6 %   Monocytes Absolute 0.6 0.1 - 1.0 K/uL   Eosinophils Relative 0 %   Eosinophils Absolute 0.0 0.0 - 0.5 K/uL   Basophils Relative 0 %   Basophils Absolute 0.0 0.0 - 0.1 K/uL   Immature Granulocytes 0 %   Abs Immature Granulocytes 0.03 0.00 - 0.07 K/uL    Comment: Performed at [facility], [place] [address] [place], [place] [address]  Brain natriuretic peptide     Status: Abnormal   Collection Time: 01/20/19  2:07 PM  Result Value Ref Range   B Natriuretic Peptide 2,283.5 (H) 0.0 - 100.0 pg/mL    Comment: Performed at [facility] [address] [place], [place] [address]  Troponin I - Once     Status: None   Collection Time: 01/20/19  2:07 PM  Result Value Ref Range   Troponin I <0.03 <0.03 ng/mL    Comment: Performed at [facility] [address] [place], [place] [address]  Protime-INR     Status: Abnormal   Collection Time: 01/20/19  2:07 PM  Result Value Ref Range   Prothrombin Time 27.7 (H) 11.4 - 15.2 seconds   INR 2.6 (H) 0.8 - 1.2    Comment: (NOTE) INR goal varies based on device and disease states. Performed at [facility], [place] [address], [place], [place] [address]    Dg Chest Port 1 View  Result Date: 01/20/2019 CLINICAL DATA:  Shortness of breath, chest pain. EXAM: PORTABLE CHEST 1 VIEW COMPARISON:  Radiographs of December 31, 2017. FINDINGS: Stable cardiomegaly. Atherosclerosis of thoracic aorta is noted. Left-sided pacemaker is unchanged in position. No pneumothorax is noted. Right lung is clear. Mild left basilar subsegmental atelectasis is noted with minimal left pleural effusion. Bony thorax is unremarkable. IMPRESSION: Mild left basilar subsegmental atelectasis with minimal left pleural effusion. Aortic Atherosclerosis (ICD10-I70.0). Electronically Signed   By: Marijo Conception M.D.   On: 01/20/2019 16:12    Pending Labs Unresulted Labs (From admission,  onward)    Start     Ordered   01/21/19 0500  Comprehensive metabolic panel  Tomorrow morning,   R     01/20/19 1943  01/21/19 0500  CBC  Tomorrow morning,   R     01/20/19 1943   01/21/19 0500  Protime-INR  Tomorrow morning,   R     01/20/19 1943   01/20/19 1726  SARS Coronavirus 2 [facility] order, Performed in [place] hospital lab)  (Novel Coronavirus, [facility] Order))  Once,   R     01/20/19 1725          Vitals/Pain Today's Vitals   01/20/19 1815 01/20/19 1830 01/20/19 1845 01/20/19 1900  BP: 121/73 119/66 120/62 125/60  Pulse: 70 63 61 63  Resp: 17 (!) 23 (!) 22 19  Temp:      TempSrc:      SpO2: 97% 93% 96% 96%  Weight:      Height:      PainSc:    0-No pain    Isolation Precautions Droplet and Contact precautions  Medications Medications  amiodarone (PACERONE) tablet 200 mg (has no administration in time range)  atorvastatin (LIPITOR) tablet 5 mg (has no administration in time range)  brimonidine (ALPHAGAN) 0.2 % ophthalmic solution 1 drop (has no administration in time range)  furosemide (LASIX) injection 40 mg (has no administration in time range)  metoprolol tartrate (LOPRESSOR) tablet 75 mg (has no administration in time range)  olopatadine (PATANOL) 0.1 % ophthalmic solution 1 drop (has no administration in time range)  latanoprost (XALATAN) 0.005 % ophthalmic solution 1 drop (has no administration in time range)  acetaminophen (TYLENOL) tablet 650 mg (has no administration in time range)    Or  acetaminophen (TYLENOL) suppository 650 mg (has no administration in time range)  ondansetron (ZOFRAN) tablet 4 mg (has no administration in time range)    Or  ondansetron (ZOFRAN) injection 4 mg (has no administration in time range)  senna-docusate (Senokot-S) tablet 1 tablet (has no administration in time range)  furosemide (LASIX) injection 40 mg (40 mg Intravenous Given 01/20/19 1705)    Mobility walks with device Low fall risk   Focused  Assessments Pulmonary Assessment Handoff:  Lung sounds: Bilateral Breath Sounds: Clear O2 Device: Room Air        R Recommendations: See Admitting Provider Note  Report given to:   Additional Notes:

## 2019-01-20 NOTE — ED Notes (Signed)
Spoke with the daughter, [name] about the pt on the phone, she is updated (with pt's permission).

## 2019-01-20 NOTE — H&P (Signed)
[facility] History and Physical   Patient: [name] OEV:[id number]   PCP: Chriss Czar, MD DOB: [birth year]   DOA: 01/20/2019   DOS: 01/20/2019   DOS: the patient was seen and examined on 01/20/2019  Patient coming from: The patient is coming from home.  Chief Complaint: shortnes of breath   HPI: [name] is a 85 y.o. female with Past medical history of .  Chronic systolic CHF, persistent A. fib, chronic kidney disease, mild dementia, dyslipidemia, HTN Come to the hospital with complaints of shortness of breath ongoing for last 3 days. No nausea no vomiting no fever no chills no chest pain no chest tightness. No diarrhea no cough no fever. Patient actually has been communicating with her PCP as well as cardiology and EP about this symptoms. Has actually been taking extra dose of Lasix. Patient was recently asked to reduce her amiodarone dose from 200 mg to 100 mg and went back on 200 mg dose. Notes from EP suggest that the patient was scheduled for an urgent pacemaker interrogation. Patient persistently symptomatic with shortness of breath and mild dizziness.  ED Course: Patient was given IV Lasix.  Due to persistent symptoms patient was referred for admission  At her baseline ambulates with support And is independent for most of her ADL; does not manages her medication on her own.  Review of Systems: as mentioned in the history of present illness.  All other systems reviewed and are negative.  Past Medical History:  Diagnosis Date  . Anemia   . Arthritis    "knees especially"  . Bradycardia   . Cardiomyopathy    presumed ischemic in the past with an EF of 35%. The most recent EF in 03-2008, however, was 55 -60%  . Chronic renal insufficiency   . Dementia ([place])    mild  . Dyslipidemia   . Glaucoma   . Hypertension   . Malleolar fracture   . Paroxysmal atrial fibrillation [facility])    Past Surgical History:  Procedure Laterality Date  . ABDOMINAL HYSTERECTOMY     . CARDIOVERSION  07/2011  . EYE SURGERY  2002   "blood clot behind eye"  . FRACTURE SURGERY  ~ 2007   RLE  . HEMORRHOID SURGERY    . INSERT / REPLACE / REMOVE PACEMAKER  09/17/11   initial placement  . INSERT / REPLACE / REMOVE PACEMAKER  09/18/11  . PACEMAKER REVISION N/A 09/18/2011   Procedure: PACEMAKER REVISION;  Surgeon: Thompson Grayer, MD;  Location: [name] CATH LAB;  Service: Cardiovascular;  Laterality: N/A;  . PERMANENT PACEMAKER INSERTION N/A 09/17/2011   Procedure: PERMANENT PACEMAKER INSERTION;  Surgeon: Deboraha Sprang, MD;  Location: [facility] CATH LAB;  Service: Cardiovascular;  Laterality: N/A;  . TONSILLECTOMY AND ADENOIDECTOMY     Social History:  reports that she has never smoked. She has never used smokeless tobacco. She reports that she does not drink alcohol or use drugs.  No Known Allergies  Family History  Problem Relation Age of Onset  . Other Other        Noncontributory     Prior to Admission medications   Medication Sig Start Date End Date Taking? Authorizing Provider  acetaminophen (TYLENOL) 500 MG tablet Take 500 mg by mouth every 6 (six) hours as needed for headache (pain).   Yes [provider]  amiodarone (PACERONE) 200 MG tablet Take 0.5 tablets (100 mg total) by mouth daily. Take 1 tab (200mg ) 5 days per week. Patient taking differently: Take  200 mg by mouth daily.  01/13/19  Yes Deboraha Sprang, MD  atorvastatin (LIPITOR) 10 MG tablet TAKE 1/2 TABLET BY MOUTH EVERY NIGHT AT BEDTIME Patient taking differently: Take 5 mg by mouth at bedtime.  10/03/18  Yes Minus Breeding, MD  brimonidine (ALPHAGAN) 0.2 % ophthalmic solution Place 1 drop into both eyes daily. 04/02/16  Yes [provider]  furosemide (LASIX) 80 MG tablet TAKE 1 TABLET(80 MG) BY MOUTH DAILY Patient taking differently: Take 80 mg by mouth daily.  03/24/18  Yes Hochrein, Jeneen Rinks, MD  lisinopril (PRINIVIL,ZESTRIL) 5 MG tablet TAKE 1 TABLET(5 MG) BY MOUTH DAILY Patient taking differently:  Take 5 mg by mouth daily.  11/30/18  Yes Minus Breeding, MD  metoprolol tartrate (LOPRESSOR) 50 MG tablet TAKE 1 AND 1/2 TABLETS(75 MG) BY MOUTH TWICE DAILY Patient taking differently: Take 75 mg by mouth 2 (two) times daily.  06/06/18  Yes Minus Breeding, MD  Multiple Vitamin (MULTIVITAMIN WITH MINERALS) TABS tablet Take 1 tablet by mouth daily.   Yes [provider]  Olopatadine HCl (PATADAY) 0.2 % SOLN Place 1 drop into both eyes daily.    Yes [provider]  potassium chloride SA (K-DUR,KLOR-CON) 20 MEQ tablet TAKE 1 TABLET BY MOUTH EVERY DAY Patient taking differently: Take 20 mEq by mouth daily.  12/19/18  Yes Minus Breeding, MD  senna (SENOKOT) 8.6 MG TABS tablet Take 1 tablet by mouth at bedtime as needed for mild constipation.   Yes [provider]  Travoprost, BAK Free, (TRAVATAN) 0.004 % SOLN ophthalmic solution Place 1 drop into both eyes at bedtime.   Yes [provider]  warfarin (COUMADIN) 2 MG tablet TAKE 1/2 TO 1 TABLET BY MOUTH DAILY AS DIRECTED BY COUMADIN CLINIC Patient taking differently: Take 2 mg by mouth daily. Or as directed by coumadin clinic 08/15/18  Yes Minus Breeding, MD    Physical Exam: Vitals:   01/20/19 1815 01/20/19 1830 01/20/19 1845 01/20/19 1900  BP: 121/73 119/66 120/62 125/60  Pulse: 70 63 61 63  Resp: 17 (!) 23 (!) 22 19  Temp:      TempSrc:      SpO2: 97% 93% 96% 96%  Weight:      Height:        General: Alert, Awake and Oriented to Time, Place and Person. Appear in mild distress, affect appropriate Eyes: PERRL, Conjunctiva normal ENT: Oral Mucosa clear moist. Neck: no JVD, no Abnormal Mass Or lumps Cardiovascular: S1 and S2 Present, n Murmur, Peripheral Pulses Present Respiratory: normal respiratory effort, Bilateral Air entry equal and Decreased, no use of accessory muscle, bilateral  Crackles, no wheezes Abdomen: Bowel Sound present, Soft and no tenderness, no hernia Skin: no redness, no Rash, no  induration Extremities: bilateral  Pedal edema, no calf tenderness Neurologic: Grossly no focal neuro deficit. Bilaterally Equal motor strength  Labs on Admission:  CBC: Recent Labs  Lab 01/20/19 1407  WBC 9.9  NEUTROABS 8.5*  HGB 10.7*  HCT 33.8*  MCV 100.9*  PLT 768   Basic Metabolic Panel: Recent Labs  Lab 01/20/19 1407  NA 143  K 3.5  CL 103  CO2 24  GLUCOSE 98  BUN 32*  CREATININE 1.56*  CALCIUM 9.2   GFR: Estimated Creatinine Clearance: 19.8 mL/min (A) (by C-G formula based on SCr of 1.56 mg/dL (H)). Liver Function Tests: No results for input(s): AST, ALT, ALKPHOS, BILITOT, PROT, ALBUMIN in the last 168 hours. No results for input(s): LIPASE, AMYLASE in the last  168 hours. No results for input(s): AMMONIA in the last 168 hours. Coagulation Profile: Recent Labs  Lab 01/20/19 1407  INR 2.6*   Cardiac Enzymes: Recent Labs  Lab 01/20/19 1407  TROPONINI <0.03   BNP (last 3 results) No results for input(s): PROBNP in the last 8760 hours. HbA1C: No results for input(s): HGBA1C in the last 72 hours. CBG: No results for input(s): GLUCAP in the last 168 hours. Lipid Profile: No results for input(s): CHOL, HDL, LDLCALC, TRIG, CHOLHDL, LDLDIRECT in the last 72 hours. Thyroid Function Tests: No results for input(s): TSH, T4TOTAL, FREET4, T3FREE, THYROIDAB in the last 72 hours. Anemia Panel: No results for input(s): VITAMINB12, FOLATE, FERRITIN, TIBC, IRON, RETICCTPCT in the last 72 hours. Urine analysis:    Component Value Date/Time   COLORURINE YELLOW 01/21/2010 [place] 01/21/2010 1424   LABSPEC 1.013 01/21/2010 1424   PHURINE 5.5 01/21/2010 1424   GLUCOSEU NEGATIVE 01/21/2010 [place] 01/21/2010 [place] 01/21/2010 [place] 01/21/2010 1424   PROTEINUR NEGATIVE 01/21/2010 1424   UROBILINOGEN 0.2 01/21/2010 1424   NITRITE NEGATIVE 01/21/2010 [place] 01/21/2010 1424     Radiological Exams on Admission: Dg Chest Port 1 View  Result Date: 01/20/2019 CLINICAL DATA:  Shortness of breath, chest pain. EXAM: PORTABLE CHEST 1 VIEW COMPARISON:  Radiographs of December 31, 2017. FINDINGS: Stable cardiomegaly. Atherosclerosis of thoracic aorta is noted. Left-sided pacemaker is unchanged in position. No pneumothorax is noted. Right lung is clear. Mild left basilar subsegmental atelectasis is noted with minimal left pleural effusion. Bony thorax is unremarkable. IMPRESSION: Mild left basilar subsegmental atelectasis with minimal left pleural effusion. Aortic Atherosclerosis (ICD10-I70.0). Electronically Signed   By: Marijo Conception M.D.   On: 01/20/2019 16:12   EKG: Independently reviewed.  Paced rhythm.  Assessment/Plan 1. Acute on chronic systolic CHF (congestive heart failure) (HCC) Mildly volume overloaded.  Weight up as well. We will give IV Lasix and monitor. Primary concern is her renal function. Suspect that the pacemaker may be contributing to the problems as well.  2.  [employer] pacemaker. Paroxysmal A. fib. Pacemaker interrogation ordered by ED. Cardiology consulted. Continue amiodarone. Continue Lopressor. We will monitor recommendation per cardiology. Continue warfarin for chronic anticoagulation.  3.  Mild renal insufficiency on chronic kidney disease stage III. Renal function mildly worsening. While the patient is receiving IV Lasix will monitor renal function closely here in the hospital. Cardiology consult for further assistance.  4.  Glaucoma. Continue home medication.  Nutrition: Cardiac diet DVT Prophylaxis: on therapeutic anticoagulation.  Advance goals of care discussion: full code   Consults: cardiology via inbasket  Family Communication: no family was present at bedside, at the time of interview.  Disposition: Admitted as observation, telemetry unit. Likely to be discharged home, in 1-2 days.  Severity of Illness: The  appropriate patient status for this patient is OBSERVATION. Observation status is judged to be reasonable and necessary in order to provide the required intensity of service to ensure the patient's safety. The patient's presenting symptoms, physical exam findings, and initial radiographic and laboratory data in the context of their medical condition is felt to place them at decreased risk for further clinical deterioration. Furthermore, it is anticipated that the patient will be medically stable for discharge from the hospital within 2 midnights of admission. The following factors support the patient status of observation.   " The patient's presenting symptoms include shortnes of breath on  exertion. " The physical exam findings include bilateral crackles, weight gain. " The initial radiographic and laboratory data are BNP in 2000, chest x-ray showing pleural effusion Aki.     Author: Berle Mull, MD [facility] 01/20/2019  To reach On-call, see care teams to locate the attending and reach out to them via [email or web address]. If 7PM-7AM, please contact night-coverage If you still have difficulty reaching the attending provider, please page the [facility] (Director on Call) for [facility] on amion for assistance.

## 2019-01-20 NOTE — ED Provider Notes (Signed)
Care assumed from Dr. Francia Greaves.  At time of transfer care, patient is awaiting results of work-up for shortness of breath.  Patient has a history of CHF.  Patient's BNP is elevated at 2283 and chest x-ray shows a small effusion.  Suspect CHF exacerbation is the cause of her symptoms of shortness of breath.  Patient's troponin is undetectable.  Plan of care is to admit if CHF exacerbation was discovered.  Patient agrees to admission due to CHF exacerbation.  She will be given IV Lasix and will be admitted.   [name], [name], MD 01/20/19 [phone]

## 2019-01-20 NOTE — Telephone Encounter (Signed)
Called to check up on pt today. Pt's daughter her weight is doen to 123lbs today. She is feeling much better. She will only take her normal dose of lasix today. A device check appt was set for 5/13 @ 1000. Pt's daughter, [name], understands she will not be able to come in for the appt, but would like to be called on the phone during her appt. Her phone number is listed in the appt notes and in pt's contacts on demographics page.   Pt's daughter will call next week if she begins to have signs of fluid overload again.   She had no additional questions or needs.

## 2019-01-21 ENCOUNTER — Observation Stay (HOSPITAL_BASED_OUTPATIENT_CLINIC_OR_DEPARTMENT_OTHER): Payer: Medicare Other

## 2019-01-21 DIAGNOSIS — I340 Nonrheumatic mitral (valve) insufficiency: Secondary | ICD-10-CM | POA: Diagnosis not present

## 2019-01-21 DIAGNOSIS — Z7901 Long term (current) use of anticoagulants: Secondary | ICD-10-CM

## 2019-01-21 DIAGNOSIS — Z950 Presence of cardiac pacemaker: Secondary | ICD-10-CM

## 2019-01-21 DIAGNOSIS — I429 Cardiomyopathy, unspecified: Secondary | ICD-10-CM

## 2019-01-21 DIAGNOSIS — F0390 Unspecified dementia without behavioral disturbance: Secondary | ICD-10-CM | POA: Diagnosis not present

## 2019-01-21 DIAGNOSIS — I495 Sick sinus syndrome: Secondary | ICD-10-CM

## 2019-01-21 DIAGNOSIS — N183 Chronic kidney disease, stage 3 (moderate): Secondary | ICD-10-CM

## 2019-01-21 DIAGNOSIS — I10 Essential (primary) hypertension: Secondary | ICD-10-CM

## 2019-01-21 DIAGNOSIS — I509 Heart failure, unspecified: Secondary | ICD-10-CM

## 2019-01-21 DIAGNOSIS — I361 Nonrheumatic tricuspid (valve) insufficiency: Secondary | ICD-10-CM

## 2019-01-21 DIAGNOSIS — I480 Paroxysmal atrial fibrillation: Secondary | ICD-10-CM

## 2019-01-21 DIAGNOSIS — I5023 Acute on chronic systolic (congestive) heart failure: Secondary | ICD-10-CM | POA: Diagnosis not present

## 2019-01-21 LAB — COMPREHENSIVE METABOLIC PANEL
ALT: 25 U/L (ref 0–44)
AST: 20 U/L (ref 15–41)
Albumin: 3.5 g/dL (ref 3.5–5.0)
Alkaline Phosphatase: 45 U/L (ref 38–126)
Anion gap: 12 (ref 5–15)
BUN: 32 mg/dL — ABNORMAL HIGH (ref 8–23)
CO2: 29 mmol/L (ref 22–32)
Calcium: 9 mg/dL (ref 8.9–10.3)
Chloride: 103 mmol/L (ref 98–111)
Creatinine, Ser: 1.61 mg/dL — ABNORMAL HIGH (ref 0.44–1.00)
GFR calc Af Amer: 32 mL/min — ABNORMAL LOW (ref >60)
GFR calc non Af Amer: 27 mL/min — ABNORMAL LOW (ref >60)
Glucose, Bld: 118 mg/dL — ABNORMAL HIGH (ref 70–99)
Potassium: 2.8 mmol/L — ABNORMAL LOW (ref 3.5–5.1)
Sodium: 144 mmol/L (ref 135–145)
Total Bilirubin: 2 mg/dL — ABNORMAL HIGH (ref 0.3–1.2)
Total Protein: 5.9 g/dL — ABNORMAL LOW (ref 6.5–8.1)

## 2019-01-21 LAB — CBC
HCT: 33 % — ABNORMAL LOW (ref 36.0–46.0)
Hemoglobin: 10.5 g/dL — ABNORMAL LOW (ref 12.0–15.0)
MCH: 32 pg (ref 26.0–34.0)
MCHC: 31.8 g/dL (ref 30.0–36.0)
MCV: 100.6 fL — ABNORMAL HIGH (ref 80.0–100.0)
Platelets: 147 10*3/uL — ABNORMAL LOW (ref 150–400)
RBC: 3.28 MIL/uL — ABNORMAL LOW (ref 3.87–5.11)
RDW: 13.4 % (ref 11.5–15.5)
WBC: 7.6 10*3/uL (ref 4.0–10.5)
nRBC: 0 % (ref 0.0–0.2)

## 2019-01-21 LAB — MAGNESIUM: Magnesium: 2.1 mg/dL (ref 1.7–2.4)

## 2019-01-21 LAB — ECHOCARDIOGRAM LIMITED
Height: 65 in
Weight: 1984 oz

## 2019-01-21 LAB — POTASSIUM: Potassium: 3.6 mmol/L (ref 3.5–5.1)

## 2019-01-21 LAB — PROTIME-INR
INR: 2.7 — ABNORMAL HIGH (ref 0.8–1.2)
Prothrombin Time: 28.4 seconds — ABNORMAL HIGH (ref 11.4–15.2)

## 2019-01-21 MED ORDER — POTASSIUM CHLORIDE CRYS ER 20 MEQ PO TBCR
40.0000 meq | EXTENDED_RELEASE_TABLET | Freq: Two times a day (BID) | ORAL | Status: DC
  Administered 2019-01-21: 40 meq via ORAL
  Filled 2019-01-21: qty 2

## 2019-01-21 MED ORDER — POTASSIUM CHLORIDE CRYS ER 20 MEQ PO TBCR
40.0000 meq | EXTENDED_RELEASE_TABLET | Freq: Every day | ORAL | Status: DC
  Administered 2019-01-22 – 2019-01-23 (×2): 40 meq via ORAL
  Filled 2019-01-21 (×2): qty 2

## 2019-01-21 MED ORDER — WARFARIN SODIUM 2 MG PO TABS
2.0000 mg | ORAL_TABLET | Freq: Once | ORAL | Status: AC
  Administered 2019-01-21: 2 mg via ORAL
  Filled 2019-01-21: qty 1

## 2019-01-21 MED ORDER — POTASSIUM CHLORIDE CRYS ER 20 MEQ PO TBCR
40.0000 meq | EXTENDED_RELEASE_TABLET | ORAL | Status: AC
  Administered 2019-01-21: 40 meq via ORAL
  Filled 2019-01-21: qty 2

## 2019-01-21 MED ORDER — FUROSEMIDE 80 MG PO TABS
80.0000 mg | ORAL_TABLET | Freq: Every day | ORAL | Status: DC
  Administered 2019-01-22: 80 mg via ORAL
  Filled 2019-01-21 (×2): qty 1

## 2019-01-21 NOTE — Evaluation (Signed)
Physical Therapy Evaluation Patient Details Name: [name] MRN: [id number] DOB: August 30, 1923 Today's Date: 01/21/2019   History of Present Illness  [name] is a 85 y.o. female with Past medical history of .  Chronic systolic CHF, persistent A. fib, chronic kidney disease, mild dementia, dyslipidemia, HTN. Admitted with SOB, COVID negative, pt with CHF exacerbation  Clinical Impression  Pt admitted with above. Pt currently lives alone with [facility] aide 4 hours in morning and 2 hours in afternoon Monday-Saturday and reports he children come on Sundays. Pt currently requiring close min guard for safe mobility at this time and would benefit from 24/7 assist/supervision as pt is at increased falls risk. Pt does have assist for bathing and cooking however is alone throughout out the night and in the afternoon. Pt required max v/c's for safe walker management to minimize falls risk. Acute PT to cont to follow.    Follow Up Recommendations Home health PT;Supervision/Assistance - 24 hour(24/7 initially)    Equipment Recommendations  None recommended by PT    Recommendations for Other Services       Precautions / Restrictions Precautions Precautions: Fall Precaution Comments: watch O2 Restrictions Weight Bearing Restrictions: No      Mobility  Bed Mobility Overal bed mobility: Needs Assistance Bed Mobility: Supine to Sit     Supine to sit: Supervision     General bed mobility comments: HOB elevated, increased time, no physical assist needed  Transfers Overall transfer level: Needs assistance Equipment used: Rolling walker (2 wheeled) Transfers: Sit to/from Stand Sit to Stand: Min assist         General transfer comment: minA to power up and steady during transition of hands from bed to RW, increased time  Ambulation/Gait Ambulation/Gait assistance: Min guard Gait Distance (Feet): 15 Feet(x2 (to/from bathroom)) Assistive device: Rolling walker (2 wheeled) Gait  Pattern/deviations: Step-through pattern;Decreased stride length;Trunk flexed;Shuffle Gait velocity: dec   General Gait Details: minA for walker management, especially in bathroom and during turning. v/c's for walker management at sink  Stairs            Wheelchair Mobility    Modified Rankin (Stroke Patients Only)       Balance Overall balance assessment: Needs assistance Sitting-balance support: Feet supported;No upper extremity supported Sitting balance-Leahy Scale: Fair     Standing balance support: No upper extremity supported Standing balance-Leahy Scale: Fair Standing balance comment: pt stood at sink to wash hands and face and leaned up against counter, requiring unilateral UE support to perform pericare s/p tolieting                             Pertinent Vitals/Pain Pain Assessment: No/denies pain    Home Living Family/patient expects to be discharged to:: Private residence Living Arrangements: Alone Available Help at Discharge: Family;Personal care attendant;Available 24 hours/day Type of Home: Apartment Home Access: Level entry     Home Layout: One level Home Equipment: Walker - 4 wheels;Walker - 2 wheels;Cane - single point;Bedside commode Additional Comments: pt reports having an aide in the morning from 8-12/1 mon-saturday and then from 4-6 to assist with dinner. familywith her on sunday, pt is home alone during the night and afternoon    Prior Function Level of Independence: Needs assistance   Gait / Transfers Assistance Needed: uses RW and cane, more of walker recently  ADL's / Homemaking Assistance Needed: aide assists with bathing, cooking, son does grocery shopping  Hand Dominance   Dominant Hand: Right    Extremity/Trunk Assessment   Upper Extremity Assessment Upper Extremity Assessment: Generalized weakness    Lower Extremity Assessment Lower Extremity Assessment: Generalized weakness    Cervical / Trunk  Assessment Cervical / Trunk Assessment: Kyphotic  Communication   Communication: HOH  Cognition Arousal/Alertness: Awake/alert Behavior During Therapy: WFL for tasks assessed/performed Overall Cognitive Status: Within Functional Limits for tasks assessed                                 General Comments: slow to response however most likely due to [place] comments (skin integrity, edema, etc.): Pt SPO2 >90% on 2LO2 t/o session.    Exercises     Assessment/Plan    PT Assessment Patient needs continued PT services  PT Problem List Decreased strength;Decreased balance;Decreased mobility;Decreased activity tolerance       PT Treatment Interventions DME instruction;Gait training;Functional mobility training;Therapeutic activities;Therapeutic exercise;Balance training    PT Goals (Current goals can be found in the Care Plan section)  Acute Rehab PT Goals Patient Stated Goal: get stronger to go home PT Goal Formulation: With patient Time For Goal Achievement: 02/04/19 Potential to Achieve Goals: Good    Frequency Min 3X/week   Barriers to discharge Decreased caregiver support(doesn't have 24/7, has aide majority of time during the day)      Co-evaluation               AM-PAC PT "6 Clicks" Mobility  Outcome Measure Help needed turning from your back to your side while in a flat bed without using bedrails?: A Little Help needed moving from lying on your back to sitting on the side of a flat bed without using bedrails?: A Little Help needed moving to and from a bed to a chair (including a wheelchair)?: A Little Help needed standing up from a chair using your arms (e.g., wheelchair or bedside chair)?: A Little Help needed to walk in hospital room?: A Little Help needed climbing 3-5 steps with a railing? : A Lot 6 Click Score: 17    End of Session Equipment Utilized During Treatment: Gait belt;Oxygen Activity Tolerance: Patient  tolerated treatment well Patient left: in chair;with call bell/phone within reach;with chair alarm set Nurse Communication: Mobility status PT Visit Diagnosis: Unsteadiness on feet (R26.81);Muscle weakness (generalized) (M62.81)    Time: 1610-9604 PT Time Calculation (min) (ACUTE ONLY): 31 min   Charges:   PT Evaluation $PT Eval Moderate Complexity: 1 Mod PT Treatments $Therapeutic Activity: 8-22 mins        Kittie Plater, PT, DPT Acute Rehabilitation Services Pager #: [phone] Office #: [phone]   Berline Lopes 01/21/2019, 9:57 AM

## 2019-01-21 NOTE — Progress Notes (Signed)
ED nurse called RN informing that they interrogated pt pacemaker in ED. ED RN states that they will call ED RN informing them if pacemaker was interrogated successfully

## 2019-01-21 NOTE — Progress Notes (Signed)
ANTICOAGULATION CONSULT NOTE - Initial Consult  Pharmacy Consult for Warfarin Indication: atrial fibrillation  No Known Allergies  Patient Measurements: Height: 5\' 5"  (165.1 cm) Weight: 124 lb (56.2 kg) IBW/kg (Calculated) : 57  Vital Signs: Temp: 98 F (36.7 C) (05/09 1134) Temp Source: Oral (05/09 0853) BP: 107/66 (05/09 1134) Pulse Rate: 62 (05/09 1134)  Labs: Recent Labs    01/20/19 1407 01/21/19 0235  HGB 10.7* 10.5*  HCT 33.8* 33.0*  PLT 164 147*  LABPROT 27.7* 28.4*  INR 2.6* 2.7*  CREATININE 1.56* 1.61*  TROPONINI <0.03  --     Estimated Creatinine Clearance: 19.4 mL/min (A) (by C-G formula based on SCr of 1.61 mg/dL (H)).   Medical History: Past Medical History:  Diagnosis Date  . Anemia   . Arthritis    "knees especially"  . Bradycardia   . Cardiomyopathy    presumed ischemic in the past with an EF of 35%. The most recent EF in 03-2008, however, was 55 -60%  . Chronic renal insufficiency   . Dementia ([place])    mild  . Dyslipidemia   . Glaucoma   . Hypertension   . Malleolar fracture   . Paroxysmal atrial fibrillation [facility])     Assessment: 85 year old female admitted for acute on chronic systolic CHF and renal insufficiency on CKD stage III. Pt with hx of a fib, treated with warfarin, continuing warfarin on admission (last dose of warfarin was at 0830 AM on 01/20/19, per family).  Of note, amiodarone was increased from 100mg  5days/week to 200mg  daily on 5/7.  INR therapeutic at 2.7. No signs of bleeding noted. Hemoglobin low stable at 10.5.   PTA regimen: 2mg  daily   Goal of Therapy:  INR 2-3   Plan:  Warfarin 2mg  tonight Check daily INR monitor H/H and symptoms of bleeding   Isaias Sakai, Pharm D PGY1 Pharmacy Resident  Phone [phone] Please use AMION for clinical pharmacists numbers  01/21/2019      12:04 PM

## 2019-01-21 NOTE — TOC Initial Note (Signed)
Transition of Care [facility]) - Initial/Assessment Note    Patient Details  Name: [name] MRN: [id number] Date of Birth: [birth year]  [facility] CM/SW Contact:    Claudie Leach, RN Phone Number: 01/21/2019, 5:58 PM  Clinical Narrative:     Discussed therapy recommendations with daughter, [name].  Daughter states she is able to stay with patient 24/7 as long as she needs it.  The patient's home health aide through [employer] is there mornings and afternoons.  Pt has all necessary DME.  The patient has used [facility] services in the past.  Daughter would like to use them again if necessary.             Expected Discharge Plan: [place] Barriers to Discharge: No Barriers Identified   Patient Goals and CMS Choice Patient states their goals for this hospitalization and ongoing recovery are:: to go home CMS Medicare.gov Compare Post Acute Care list provided to:: Patient Represenative (must comment)(daughter, [name]) Choice offered to / list presented to : Adult Children  Expected Discharge Plan and Services Expected Discharge Plan: [place]   Discharge Planning Services: CM Consult   Living arrangements for the past 2 months: Apartment                                      Prior Living Arrangements/Services Living arrangements for the past 2 months: Apartment Lives with:: Self(has [employer] aide morning and afternoons)              Current home services: Homehealth aide    Activities of Daily Living Home Assistive Devices/Equipment: None ADL Screening (condition at time of admission) Patient's cognitive ability adequate to safely complete daily activities?: Yes Is the patient deaf or have difficulty hearing?: No Does the patient have difficulty seeing, even when wearing glasses/contacts?: No Does the patient have difficulty concentrating, remembering, or making decisions?: No Patient able to express need for  assistance with ADLs?: Yes Does the patient have difficulty dressing or bathing?: No Independently performs ADLs?: Yes (appropriate for developmental age)(home health aides visit) Does the patient have difficulty walking or climbing stairs?: No Weakness of Legs: Both Weakness of Arms/Hands: Both  Permission Sought/Granted                  Emotional Assessment              Admission diagnosis:  Dyspnea, unspecified type [R06.00] Acute on chronic congestive heart failure, unspecified heart failure type ([place]) [I50.9] Patient Active Problem List   Diagnosis Date Noted  . Acute on chronic systolic CHF (congestive heart failure) ([place]) 03/04/2018  . PAF (paroxysmal atrial fibrillation) ([place]) 04/19/2017  . Chronic anticoagulation 12/15/2015  . Encounter for therapeutic drug monitoring 12/25/2013  . Pacemaker infection ([place]) 08/18/2012  . Pacemaker- [employer]tr[employer] 12/25/2011  . Tachycardia-bradycardia syndrome ([place]) 09/19/2011  . Chronic renal insufficiency, stage III (moderate) ([place]) 03/12/2010  . Dyslipidemia 12/28/2007  . ANEMIA 12/28/2007  . Dementia without behavioral disturbance ([place]) 12/28/2007  . Glaucoma 12/28/2007  . Essential hypertension 12/28/2007  . Cardiomyopathy EF 20% by echo [facility]) 12/28/2007  . Atrial fibrillation with RVR ([place]) 12/28/2007  . Sinus node dysfunction ([place]) 12/28/2007  . CHF 25/63/8937   PCP:  Chriss Czar, MD Pharmacy:   [facility] [address] [place] - [address] [place] [address] Phone: [phone]  Fax: [facility] [place], [place] - [place] [place] [address] Phone: [phone] Fax: [phone]     Social Determinants of Health (SDOH) Interventions    Readmission Risk Interventions No flowsheet data found.

## 2019-01-21 NOTE — Progress Notes (Addendum)
[facility]                                                                              Patient Demographics  [name], is a 85 y.o. female, DOB - Aug 17, 1923, JGG:[id number]  Admit date - 01/20/2019   Admitting Physician Lavina Hamman, MD  Outpatient Primary MD for the patient is Chriss Czar, MD  Outpatient specialists:   LOS - 0  days   Medical records reviewed and are as summarized below:    Chief Complaint  Patient presents with  . Shortness of Breath       Brief summary   Patient is a 85 year old female with chronic systolic CHF, persistent A. fib, CKD, mild dementia, hip hypertension, dyslipidemia presented with shortness of breath and mild dizziness ongoing for 3 days prior to admission.  No fever chills coughing, chest pain, nausea vomiting.  Patient had been communicating with her PCP and cardiology about her symptoms and had been taking extra dose of Lasix.  She has been making adjustment in her amiodarone dose per her cardiologist recommendations.   Patient was given IV Lasix in ED and pacemaker was interrogated.  She was admitted for acute on chronic CHF.  Assessment & Plan    Principal Problem:   Acute on chronic systolic CHF (congestive heart failure) ([place]), cardiomyopathy -BNP was markedly elevated, chest x-ray with no significant pulmonary edema -Patient received IV Lasix for diuresis, I's and O's with +480 cc -Weight at the time of admission 126lbs, down to 124lbs today -Cardiology consulted, follow 2D echocardiogram -Transition to oral Lasix today, continue beta-blocker   Active Problems: Hypokalemia -Potassium 2.8 this morning -Likely due to IV Lasix.  Potassium 2.8, magnesium 2.1 -Received K-Dur 40 meq 2 doses, will recheck potassium later today    PAF (paroxysmal atrial fibrillation) (HCC) -Currently stable, continue amiodarone and beta-blocker -Continue Coumadin per pharmacy   History of sick sinus syndrome with  pacemaker -Cardiology following, pacemaker interrogated, normal device function, no ventricular pacing    Dyslipidemia -Continue Lipitor    Dementia without behavioral disturbance (HCC) -Currently stable, no acute issues     Essential hypertension -Stable, continue beta-blocker  CKD stage III Baseline creatinine 1.5, currently 1.6, likely slightly worsened due to diuresis Hold lisinopril  Code Status: Full CODE STATUS DVT Prophylaxis: Coumadin family Communication: Discussed in detail with the patient, all imaging results, lab results explained to the patient   Disposition Plan: 2D echo pending.  Feels very weak, does not feel safe going home today. Will continue current work-up, DC home in a.m.   Time Spent in minutes 35 minutes  Procedures:  None  Consultants:   Cardiology  Antimicrobials:   Anti-infectives (From admission, onward)   None          Medications  Scheduled Meds: . amiodarone  200 mg Oral Daily  . atorvastatin  5 mg Oral QHS  . brimonidine  1 drop Both Eyes Daily  . [START ON 01/22/2019] furosemide  80 mg Oral Daily  . latanoprost  1 drop Both Eyes QHS  . metoprolol tartrate  75 mg Oral BID  . olopatadine  1  drop Both Eyes BID  . [START ON 01/22/2019] potassium chloride  40 mEq Oral Daily  . Warfarin - Pharmacist Dosing Inpatient   Does not apply q1800   Continuous Infusions: PRN Meds:.acetaminophen **OR** acetaminophen, ondansetron **OR** ondansetron (ZOFRAN) IV, senna-docusate      Subjective:   [name] was seen and examined today.  Currently no acute complaints, no chest pain, palpitations.  No fevers.  Just feeling weak and not at baseline yet.  Patient denies dizziness, abdominal pain, N/V/D/C, new weakness, numbess, tingling. No acute events overnight.    Objective:   Vitals:   01/21/19 0407 01/21/19 0438 01/21/19 0853 01/21/19 1134  BP: 121/62  107/61 107/66  Pulse: (!) 59  61 62  Resp: (!) 24 18 18 20   Temp: 97.6 F  (36.4 C)  98.4 F (36.9 C) 98 F (36.7 C)  TempSrc: Oral  Oral   SpO2: 96%  100% 100%  Weight:      Height:        Intake/Output Summary (Last 24 hours) at 01/21/2019 1144 Last data filed at 01/21/2019 0900 Gross per 24 hour  Intake 480 ml  Output -  Net 480 ml     Wt Readings from Last 3 Encounters:  01/21/19 56.2 kg  01/13/19 58.5 kg  01/10/19 57.2 kg     Exam  General: Alert and oriented x 3, NAD  Eyes:   HEENT:    Cardiovascular: S1 S2 auscultated, 3/6 SEM . Regular rate and rhythm.  Respiratory: Mild bibasilar crackles, no wheezing  Gastrointestinal: Soft, nontender, nondistended, + bowel sounds  Ext: no pedal edema bilaterally  Neuro: No new deficits  Musculoskeletal: No digital cyanosis, clubbing  Skin: No rashes  Psych: Normal affect and demeanor, alert and oriented x3    Data Reviewed:  I have personally reviewed following labs and imaging studies  Micro Results Recent Results (from the past 240 hour(s))  SARS Coronavirus 2 [facility] order, Performed in [place] hospital lab)     Status: None   Collection Time: 01/20/19  6:46 PM  Result Value Ref Range Status   SARS Coronavirus 2 NEGATIVE NEGATIVE Final    Comment: (NOTE) If result is NEGATIVE SARS-CoV-2 target nucleic acids are NOT DETECTED. The SARS-CoV-2 RNA is generally detectable in upper and lower  respiratory specimens during the acute phase of infection. The lowest  concentration of SARS-CoV-2 viral copies this assay can detect is 250  copies / mL. A negative result does not preclude SARS-CoV-2 infection  and should not be used as the sole basis for treatment or other  patient management decisions.  A negative result may occur with  improper specimen collection / handling, submission of specimen other  than nasopharyngeal swab, presence of viral mutation(s) within the  areas targeted by this assay, and inadequate number of viral copies  (<250 copies / mL). A negative result must  be combined with clinical  observations, patient history, and epidemiological information. If result is POSITIVE SARS-CoV-2 target nucleic acids are DETECTED. The SARS-CoV-2 RNA is generally detectable in upper and lower  respiratory specimens dur ing the acute phase of infection.  Positive  results are indicative of active infection with SARS-CoV-2.  Clinical  correlation with patient history and other diagnostic information is  necessary to determine patient infection status.  Positive results do  not rule out bacterial infection or co-infection with other viruses. If result is PRESUMPTIVE POSTIVE SARS-CoV-2 nucleic acids MAY BE PRESENT.   A presumptive positive result was  obtained on the submitted specimen  and confirmed on repeat testing.  While 2019 novel coronavirus  (SARS-CoV-2) nucleic acids may be present in the submitted sample  additional confirmatory testing may be necessary for epidemiological  and / or clinical management purposes  to differentiate between  SARS-CoV-2 and other Sarbecovirus currently known to infect humans.  If clinically indicated additional testing with an alternate test  methodology [id number]) is advised. The SARS-CoV-2 RNA is generally  detectable in upper and lower respiratory sp ecimens during the acute  phase of infection. The expected result is Negative. Fact Sheet for Patients:  StrictlyIdeas.no Fact Sheet for Healthcare Providers: BankingDealers.co.za This test is not yet approved or cleared by the [place] FDA and has been authorized for detection and/or diagnosis of SARS-CoV-2 by FDA under an Emergency Use Authorization (EUA).  This EUA will remain in effect (meaning this test can be used) for the duration of the COVID-19 declaration under Section 564(b)(1) of the Act, 21 U.S.C. section 360bbb-3(b)(1), unless the authorization is terminated or revoked sooner. Performed at [facility], [place] [address] [place], [place] [address]     Radiology Reports Dg Chest [place] 1 View  Result Date: 01/20/2019 CLINICAL DATA:  Shortness of breath, chest pain. EXAM: PORTABLE CHEST 1 VIEW COMPARISON:  Radiographs of December 31, 2017. FINDINGS: Stable cardiomegaly. Atherosclerosis of thoracic aorta is noted. Left-sided pacemaker is unchanged in position. No pneumothorax is noted. Right lung is clear. Mild left basilar subsegmental atelectasis is noted with minimal left pleural effusion. Bony thorax is unremarkable. IMPRESSION: Mild left basilar subsegmental atelectasis with minimal left pleural effusion. Aortic Atherosclerosis (ICD10-I70.0). Electronically Signed   By: Marijo Conception M.D.   On: 01/20/2019 16:12    Lab Data:  CBC: Recent Labs  Lab 01/20/19 1407 01/21/19 0235  WBC 9.9 7.6  NEUTROABS 8.5*  --   HGB 10.7* 10.5*  HCT 33.8* 33.0*  MCV 100.9* 100.6*  PLT 164 007*   Basic Metabolic Panel: Recent Labs  Lab 01/20/19 1407 01/21/19 0235 01/21/19 0645  NA 143 144  --   K 3.5 2.8*  --   CL 103 103  --   CO2 24 29  --   GLUCOSE 98 118*  --   BUN 32* 32*  --   CREATININE 1.56* 1.61*  --   CALCIUM 9.2 9.0  --   MG  --   --  2.1   GFR: Estimated Creatinine Clearance: 19.4 mL/min (A) (by C-G formula based on SCr of 1.61 mg/dL (H)). Liver Function Tests: Recent Labs  Lab 01/21/19 0235  AST 20  ALT 25  ALKPHOS 45  BILITOT 2.0*  PROT 5.9*  ALBUMIN 3.5   No results for input(s): LIPASE, AMYLASE in the last 168 hours. No results for input(s): AMMONIA in the last 168 hours. Coagulation Profile: Recent Labs  Lab 01/20/19 1407 01/21/19 0235  INR 2.6* 2.7*   Cardiac Enzymes: Recent Labs  Lab 01/20/19 1407  TROPONINI <0.03   BNP (last 3 results) No results for input(s): PROBNP in the last 8760 hours. HbA1C: No results for input(s): HGBA1C in the last 72 hours. CBG: No results for input(s): GLUCAP in the last 168 hours. Lipid Profile: No results  for input(s): CHOL, HDL, LDLCALC, TRIG, CHOLHDL, LDLDIRECT in the last 72 hours. Thyroid Function Tests: No results for input(s): TSH, T4TOTAL, FREET4, T3FREE, THYROIDAB in the last 72 hours. Anemia Panel: No results for input(s): VITAMINB12, FOLATE, FERRITIN, TIBC, IRON, RETICCTPCT in the  last 72 hours. Urine analysis:    Component Value Date/Time   COLORURINE YELLOW 01/21/2010 1424   APPEARANCEUR CLEAR 01/21/2010 1424   LABSPEC 1.013 01/21/2010 1424   PHURINE 5.5 01/21/2010 [place] 01/21/2010 [place] 01/21/2010 [place] 01/21/2010 [place] 01/21/2010 1424   PROTEINUR NEGATIVE 01/21/2010 1424   UROBILINOGEN 0.2 01/21/2010 1424   NITRITE NEGATIVE 01/21/2010 [place] 01/21/2010 [place] M.D. [facility] 01/21/2019, 11:44 AM  Pager: [phone] Between 7am to 7pm - call Pager - [phone]  After 7pm go to [email or web address] - password [email or web address]  Call night coverage person covering after 7pm

## 2019-01-21 NOTE — Consult Note (Signed)
Cardiology Consultation:   Patient ID: [name] MRN: [id number]; DOB: 17-Mar-1923  Admit date: 01/20/2019 Date of Consult: 01/21/2019  Primary Care Provider: Chriss Czar, MD Primary Cardiologist: Minus Breeding, MD ;  Primary Electrophysiologist: Olin Pia, MD   Patient Profile:   [name] is a 85 y.o. female with a hx of CHF, Parox AFib who is being seen today for the evaluation of dyspnea/HF exacerbation at the request of Dr. Tana Coast.  History of Present Illness:   [name] has a history of tachycardia-bradycardia syndrome, paroxysmal atrial fibrillation, dual-chamber permanent pacemaker ([employer]tronic), history of cardiomyopathy of uncertain etiology (EF in 2007 was 35%, but EF in 2009, 55-60%), CKD stage III, mild dementia, very hard of hearing, essential hypertension and dyslipidemia.  She recently had telemedicine visits with both Dr. Percival Spanish and Dr. Caryl Comes and was doing reasonably well.  Her home monitor was no longer able to do remote transmissions from her pacemaker and a new one was ordered.  Her download for May was not performed yet, but it was not deemed to be urgent (as has been reflected in the chart).  Her dose of amiodarone was reduced since she was doing so well with a very low burden of arrhythmia, to reduce the likelihood of side effects (it was not reduced due to side effects).  According to her daughter [name] she was doing well on Thursday and on Friday morning, then around noon on Friday she began complaining of shortness of breath and orthopnea.  She did not have palpitations, dizziness or syncope or any angina pectoris.  She has not had leg edema.  Her usual "dry weight" is estimated to be 126 pounds on her home scale and today she weighs 124 pounds on the hospital scale.  Comprehensive interrogation of her dual-chamber Medtronic permanent pacemaker today shows normal device function.  Anticipated device longevity of 6.5 years.  She has virtually 100% atrial  pacing and never requires ventricular pacing.  The burden of atrial fibrillation is very low, pretty much limited to a 35-hour episode of paroxysmal atrial fibrillation that occurred in early February.  Although there is brief rapid ventricular rate, on the average the ventricular rates are well controlled during the spells of atrial fibrillation.  No arrhythmia has been detected in the last 48 hours.  Past Medical History:  Diagnosis Date  . Anemia   . Arthritis    "knees especially"  . Bradycardia   . Cardiomyopathy    presumed ischemic in the past with an EF of 35%. The most recent EF in 03-2008, however, was 55 -60%  . Chronic renal insufficiency   . Dementia ([place])    mild  . Dyslipidemia   . Glaucoma   . Hypertension   . Malleolar fracture   . Paroxysmal atrial fibrillation [facility])     Past Surgical History:  Procedure Laterality Date  . ABDOMINAL HYSTERECTOMY    . CARDIOVERSION  07/2011  . EYE SURGERY  2002   "blood clot behind eye"  . FRACTURE SURGERY  ~ 2007   RLE  . HEMORRHOID SURGERY    . INSERT / REPLACE / REMOVE PACEMAKER  09/17/11   initial placement  . INSERT / REPLACE / REMOVE PACEMAKER  09/18/11  . PACEMAKER REVISION N/A 09/18/2011   Procedure: PACEMAKER REVISION;  Surgeon: Thompson Grayer, MD;  Location: [facility] C[facility] LAB;  Service: Cardiovascular;  Laterality: N/A;  . PERMANENT PACEMAKER INSERTION N/A 09/17/2011   Procedure: PERMANENT PACEMAKER INSERTION;  Surgeon: Deboraha Sprang, MD;  Location:  [facility] CATH LAB;  Service: Cardiovascular;  Laterality: N/A;  . TONSILLECTOMY AND ADENOIDECTOMY       Home Medications:  Prior to Admission medications   Medication Sig Start Date End Date Taking? Authorizing Provider  acetaminophen (TYLENOL) 500 MG tablet Take 500 mg by mouth every 6 (six) hours as needed for headache (pain).   Yes [provider]  amiodarone (PACERONE) 200 MG tablet Take 0.5 tablets (100 mg total) by mouth daily. Take 1 tab (200mg ) 5 days per week. Patient  taking differently: Take 200 mg by mouth daily.  01/13/19  Yes Deboraha Sprang, MD  atorvastatin (LIPITOR) 10 MG tablet TAKE 1/2 TABLET BY MOUTH EVERY NIGHT AT BEDTIME Patient taking differently: Take 5 mg by mouth at bedtime.  10/03/18  Yes Minus Breeding, MD  brimonidine (ALPHAGAN) 0.2 % ophthalmic solution Place 1 drop into both eyes daily. 04/02/16  Yes [provider]  furosemide (LASIX) 80 MG tablet TAKE 1 TABLET(80 MG) BY MOUTH DAILY Patient taking differently: Take 80 mg by mouth daily.  03/24/18  Yes Hochrein, Jeneen Rinks, MD  lisinopril (PRINIVIL,ZESTRIL) 5 MG tablet TAKE 1 TABLET(5 MG) BY MOUTH DAILY Patient taking differently: Take 5 mg by mouth daily.  11/30/18  Yes Minus Breeding, MD  metoprolol tartrate (LOPRESSOR) 50 MG tablet TAKE 1 AND 1/2 TABLETS(75 MG) BY MOUTH TWICE DAILY Patient taking differently: Take 75 mg by mouth 2 (two) times daily.  06/06/18  Yes Minus Breeding, MD  Multiple Vitamin (MULTIVITAMIN WITH MINERALS) TABS tablet Take 1 tablet by mouth daily.   Yes [provider]  Olopatadine HCl (PATADAY) 0.2 % SOLN Place 1 drop into both eyes daily.    Yes [provider]  potassium chloride SA (K-DUR,KLOR-CON) 20 MEQ tablet TAKE 1 TABLET BY MOUTH EVERY DAY Patient taking differently: Take 20 mEq by mouth daily.  12/19/18  Yes Minus Breeding, MD  senna (SENOKOT) 8.6 MG TABS tablet Take 1 tablet by mouth at bedtime as needed for mild constipation.   Yes [provider]  Travoprost, BAK Free, (TRAVATAN) 0.004 % SOLN ophthalmic solution Place 1 drop into both eyes at bedtime.   Yes [provider]  warfarin (COUMADIN) 2 MG tablet TAKE 1/2 TO 1 TABLET BY MOUTH DAILY AS DIRECTED BY COUMADIN CLINIC Patient taking differently: Take 2 mg by mouth daily. Or as directed by coumadin clinic 08/15/18  Yes Minus Breeding, MD    Inpatient Medications: Scheduled Meds: . amiodarone  200 mg Oral Daily  . atorvastatin  5 mg Oral QHS  . brimonidine  1  drop Both Eyes Daily  . furosemide  40 mg Intravenous BID  . latanoprost  1 drop Both Eyes QHS  . metoprolol tartrate  75 mg Oral BID  . olopatadine  1 drop Both Eyes BID  . [START ON 01/22/2019] potassium chloride  40 mEq Oral Daily  . Warfarin - Pharmacist Dosing Inpatient   Does not apply q1800   Continuous Infusions:  PRN Meds: acetaminophen **OR** acetaminophen, ondansetron **OR** ondansetron (ZOFRAN) IV, senna-docusate  Allergies:   No Known Allergies  Social History:   Social History   Socioeconomic History  . Marital status: Widowed    Spouse name: Not on file  . Number of children: Not on file  . Years of education: Not on file  . Highest education level: Not on file  Occupational History    Employer: UNEMPLOYED  Social Needs  . Financial resource strain: Not on file  . Food insecurity:  Worry: Not on file    Inability: Not on file  . Transportation needs:    Medical: Not on file    Non-medical: Not on file  Tobacco Use  . Smoking status: Never Smoker  . Smokeless tobacco: Never Used  Substance and Sexual Activity  . Alcohol use: No  . Drug use: No  . Sexual activity: Never  Lifestyle  . Physical activity:    Days per week: Not on file    Minutes per session: Not on file  . Stress: Not on file  Relationships  . Social connections:    Talks on phone: Not on file    Gets together: Not on file    Attends religious service: Not on file    Active member of club or organization: Not on file    Attends meetings of clubs or organizations: Not on file    Relationship status: Not on file  . Intimate partner violence:    Fear of current or ex partner: Not on file    Emotionally abused: Not on file    Physically abused: Not on file    Forced sexual activity: Not on file  Other Topics Concern  . Not on file  Social History Narrative  . Not on file    Family History:    Family History  Problem Relation Age of Onset  . Other Other        Noncontributory      ROS:  Please see the history of present illness.  She is very hard of hearing and this makes it a little challenging to get an accurate review of systems. All other ROS reviewed and negative.     Physical Exam/Data:   Vitals:   01/21/19 0010 01/21/19 0407 01/21/19 0438 01/21/19 0853  BP: 116/67 121/62  107/61  Pulse: 60 (!) 59  61  Resp: 16 (!) 24 18 18   Temp: 97.7 F (36.5 C) 97.6 F (36.4 C)  98.4 F (36.9 C)  TempSrc: Oral Oral  Oral  SpO2: 95% 96%  100%  Weight: 56.2 kg     Height:        Intake/Output Summary (Last 24 hours) at 01/21/2019 1020 Last data filed at 01/21/2019 0900 Gross per 24 hour  Intake 480 ml  Output -  Net 480 ml   Last 3 Weights 01/21/2019 01/20/2019 01/20/2019  Weight (lbs) 124 lb 124 lb 8 oz 126 lb 1.7 oz  Weight (kg) 56.246 kg 56.473 kg 57.2 kg     Body mass index is 20.63 kg/m.  General:  Well nourished, well developed, in no acute distress, appears elderly and frail, but quite comfortable HEENT: normal Lymph: no adenopathy Neck: JVP roughly 6-8 cm Endocrine:  No thryomegaly Vascular: No carotid bruits; FA pulses 2+ bilaterally without bruits  Cardiac:  normal S1, paradoxically split S2; RRR; 3/6 early to mid peaking systolic ejection murmur with a raspy quality is heard both in the aortic focus and at the left lower sternal border, no diastolic murmur Lungs: She has a few crackles in the left base that mostly clear with cough, otherwise clear to auscultation bilaterally, no wheezing, rhonchi or rales  Abd: soft, nontender, no hepatomegaly  Ext: no edema Musculoskeletal:  No deformities, BUE and BLE strength normal and equal Skin: warm and dry  Neuro:  CNs 2-12 intact, no focal abnormalities noted Psych:  Normal affect   EKG:  The EKG was personally reviewed and demonstrates: Atrial paced, ventricular sensed rhythm, long AV  delay, left bundle branch block Telemetry:  Telemetry was personally reviewed and demonstrates: Atrial paced,  ventricular sensed rhythm  Relevant CV Studies: Comprehensive pacemaker check described above. No echo performed since 2009, when reported EF was 55 to 60% with inferoposterior hypokinesis, without meaningful valvular abnormalities.  Laboratory Data:  Chemistry Recent Labs  Lab 01/20/19 1407 01/21/19 0235  NA 143 144  K 3.5 2.8*  CL 103 103  CO2 24 29  GLUCOSE 98 118*  BUN 32* 32*  CREATININE 1.56* 1.61*  CALCIUM 9.2 9.0  GFRNONAA 28* 27*  GFRAA 33* 32*  ANIONGAP 16* 12    Recent Labs  Lab 01/21/19 0235  PROT 5.9*  ALBUMIN 3.5  AST 20  ALT 25  ALKPHOS 45  BILITOT 2.0*   Hematology Recent Labs  Lab 01/20/19 1407 01/21/19 0235  WBC 9.9 7.6  RBC 3.35* 3.28*  HGB 10.7* 10.5*  HCT 33.8* 33.0*  MCV 100.9* 100.6*  MCH 31.9 32.0  MCHC 31.7 31.8  RDW 13.5 13.4  PLT 164 147*   Cardiac Enzymes Recent Labs  Lab 01/20/19 1407  TROPONINI <0.03   No results for input(s): TROPIPOC in the last 168 hours.  BNP Recent Labs  Lab 01/20/19 1407  BNP 2,283.5*    DDimer No results for input(s): DDIMER in the last 168 hours.  Radiology/Studies:  Dg Chest Port 1 View  Result Date: 01/20/2019 CLINICAL DATA:  Shortness of breath, chest pain. EXAM: PORTABLE CHEST 1 VIEW COMPARISON:  Radiographs of December 31, 2017. FINDINGS: Stable cardiomegaly. Atherosclerosis of thoracic aorta is noted. Left-sided pacemaker is unchanged in position. No pneumothorax is noted. Right lung is clear. Mild left basilar subsegmental atelectasis is noted with minimal left pleural effusion. Bony thorax is unremarkable. IMPRESSION: Mild left basilar subsegmental atelectasis with minimal left pleural effusion. Aortic Atherosclerosis (ICD10-I70.0). Electronically Signed   By: Marijo Conception M.D.   On: 01/20/2019 16:12    Assessment and Plan:   1. CHF: Appears comfortable sitting up and by clinical exam she appears to be euvolemic, although reportedly she had orthopnea yesterday.  Her weight is lower  than her previously estimated "dry weight".  Her chest x-ray does not show pulmonary edema.  Her BNP is markedly elevated but there is no baseline available.  I think she can be managed with oral diuretics at this point.  Repeat echocardiogram. 2. AFib: Very low burden of arrhythmia, no significant events since February 7. CHADSVasc 5-6 (age 51, HF, HTN, gender, +/-CAD). 3. Anticoagulation: Well-tolerated no active bleeding problems, therapeutic INR without fail over the last 6 months. 4. Amiodarone: The dose changed 2 days ago has not yet had any meaningful impact and her current symptoms are not related to arrhythmia.  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 5. SSS: Virtually 100% atrial paced with good heart rate histogram distribution on current sensor settings. 6. PM: Normal device function.  Virtually no ventricular pacing.  She is receiving a new monitor for remote downloads, the next one will be due in early August. 7. Murmur: None to tell by physical exam whether she has a murmur of aortic valve sclerosis/stenosis or just tricuspid insufficiency or both.  Check echocardiogram. 8. CKD 3-4: Usual creatinine clearance is around 35, now creatinine slightly higher at 1.6 (baseline roughly 1.4), following diuresis. 9. Hypokalemia: I wonder what degree this is contributing to her current residual weakness.  Replete.      For questions or updates, please contact [facility] Please consult [email or web address] for contact info under     Signed, Sanda Klein, MD  01/21/2019 10:20 AM

## 2019-01-21 NOTE — Plan of Care (Signed)

## 2019-01-21 NOTE — Progress Notes (Signed)
  Echocardiogram 2D Echocardiogram has been performed.  [name] 01/21/2019, 2:47 PM

## 2019-01-21 NOTE — Care Management Obs Status (Addendum)
[place] NOTIFICATION   Patient Details  Name: [name] MRN: [id number] Date of Birth: 05-10-1923   Medicare Observation Status Notification Given:  Yes   RN to deliver to patient's room.   Claudie Leach, RN 01/21/2019, 5:55 PM

## 2019-01-22 DIAGNOSIS — F0390 Unspecified dementia without behavioral disturbance: Secondary | ICD-10-CM | POA: Diagnosis present

## 2019-01-22 DIAGNOSIS — E876 Hypokalemia: Secondary | ICD-10-CM | POA: Diagnosis present

## 2019-01-22 DIAGNOSIS — I5033 Acute on chronic diastolic (congestive) heart failure: Secondary | ICD-10-CM

## 2019-01-22 DIAGNOSIS — I255 Ischemic cardiomyopathy: Secondary | ICD-10-CM | POA: Diagnosis not present

## 2019-01-22 DIAGNOSIS — I5043 Acute on chronic combined systolic (congestive) and diastolic (congestive) heart failure: Secondary | ICD-10-CM | POA: Diagnosis present

## 2019-01-22 DIAGNOSIS — T501X5A Adverse effect of loop [high-ceiling] diuretics, initial encounter: Secondary | ICD-10-CM | POA: Diagnosis present

## 2019-01-22 DIAGNOSIS — Z90710 Acquired absence of both cervix and uterus: Secondary | ICD-10-CM | POA: Diagnosis not present

## 2019-01-22 DIAGNOSIS — I447 Left bundle-branch block, unspecified: Secondary | ICD-10-CM | POA: Diagnosis present

## 2019-01-22 DIAGNOSIS — Z79899 Other long term (current) drug therapy: Secondary | ICD-10-CM | POA: Diagnosis not present

## 2019-01-22 DIAGNOSIS — N189 Chronic kidney disease, unspecified: Secondary | ICD-10-CM

## 2019-01-22 DIAGNOSIS — N289 Disorder of kidney and ureter, unspecified: Secondary | ICD-10-CM

## 2019-01-22 DIAGNOSIS — T502X5A Adverse effect of carbonic-anhydrase inhibitors, benzothiadiazides and other diuretics, initial encounter: Secondary | ICD-10-CM | POA: Diagnosis present

## 2019-01-22 DIAGNOSIS — Z950 Presence of cardiac pacemaker: Secondary | ICD-10-CM | POA: Diagnosis not present

## 2019-01-22 DIAGNOSIS — N183 Chronic kidney disease, stage 3 (moderate): Secondary | ICD-10-CM | POA: Diagnosis present

## 2019-01-22 DIAGNOSIS — Z7901 Long term (current) use of anticoagulants: Secondary | ICD-10-CM | POA: Diagnosis not present

## 2019-01-22 DIAGNOSIS — Z20828 Contact with and (suspected) exposure to other viral communicable diseases: Secondary | ICD-10-CM | POA: Diagnosis present

## 2019-01-22 DIAGNOSIS — I509 Heart failure, unspecified: Secondary | ICD-10-CM | POA: Diagnosis not present

## 2019-01-22 DIAGNOSIS — I130 Hypertensive heart and chronic kidney disease with heart failure and stage 1 through stage 4 chronic kidney disease, or unspecified chronic kidney disease: Secondary | ICD-10-CM | POA: Diagnosis present

## 2019-01-22 DIAGNOSIS — I5023 Acute on chronic systolic (congestive) heart failure: Secondary | ICD-10-CM | POA: Diagnosis not present

## 2019-01-22 DIAGNOSIS — E785 Hyperlipidemia, unspecified: Secondary | ICD-10-CM | POA: Diagnosis present

## 2019-01-22 DIAGNOSIS — I429 Cardiomyopathy, unspecified: Secondary | ICD-10-CM | POA: Diagnosis not present

## 2019-01-22 DIAGNOSIS — H9190 Unspecified hearing loss, unspecified ear: Secondary | ICD-10-CM | POA: Diagnosis present

## 2019-01-22 DIAGNOSIS — I480 Paroxysmal atrial fibrillation: Secondary | ICD-10-CM | POA: Diagnosis present

## 2019-01-22 DIAGNOSIS — H409 Unspecified glaucoma: Secondary | ICD-10-CM | POA: Diagnosis present

## 2019-01-22 HISTORY — DX: Acute on chronic diastolic (congestive) heart failure: I50.33

## 2019-01-22 LAB — BASIC METABOLIC PANEL
Anion gap: 13 (ref 5–15)
BUN: 32 mg/dL — ABNORMAL HIGH (ref 8–23)
CO2: 29 mmol/L (ref 22–32)
Calcium: 9.2 mg/dL (ref 8.9–10.3)
Chloride: 101 mmol/L (ref 98–111)
Creatinine, Ser: 1.76 mg/dL — ABNORMAL HIGH (ref 0.44–1.00)
GFR calc Af Amer: 28 mL/min — ABNORMAL LOW (ref >60)
GFR calc non Af Amer: 24 mL/min — ABNORMAL LOW (ref >60)
Glucose, Bld: 132 mg/dL — ABNORMAL HIGH (ref 70–99)
Potassium: 4.1 mmol/L (ref 3.5–5.1)
Sodium: 143 mmol/L (ref 135–145)

## 2019-01-22 LAB — PROTIME-INR
INR: 2.8 — ABNORMAL HIGH (ref 0.8–1.2)
Prothrombin Time: 28.7 seconds — ABNORMAL HIGH (ref 11.4–15.2)

## 2019-01-22 LAB — CBC
HCT: 34.6 % — ABNORMAL LOW (ref 36.0–46.0)
Hemoglobin: 11 g/dL — ABNORMAL LOW (ref 12.0–15.0)
MCH: 32.2 pg (ref 26.0–34.0)
MCHC: 31.8 g/dL (ref 30.0–36.0)
MCV: 101.2 fL — ABNORMAL HIGH (ref 80.0–100.0)
Platelets: 176 10*3/uL (ref 150–400)
RBC: 3.42 MIL/uL — ABNORMAL LOW (ref 3.87–5.11)
RDW: 13.6 % (ref 11.5–15.5)
WBC: 8.2 10*3/uL (ref 4.0–10.5)
nRBC: 0 % (ref 0.0–0.2)

## 2019-01-22 LAB — MAGNESIUM: Magnesium: 2 mg/dL (ref 1.7–2.4)

## 2019-01-22 MED ORDER — WARFARIN SODIUM 2 MG PO TABS
2.0000 mg | ORAL_TABLET | Freq: Once | ORAL | Status: AC
  Administered 2019-01-22: 2 mg via ORAL
  Filled 2019-01-22: qty 1

## 2019-01-22 NOTE — Progress Notes (Signed)
Progress Note  Patient Name: [name] Date of Encounter: 01/22/2019  Primary Cardiologist: Minus Breeding, MD   Subjective   Feels markedly improved.  Able to sleep supine overnight. Potassium repleted. Echocardiogram shows severely decreased left ventricular systolic function and severely elevated left atrial filling pressures.   Inpatient Medications    Scheduled Meds: . amiodarone  200 mg Oral Daily  . atorvastatin  5 mg Oral QHS  . brimonidine  1 drop Both Eyes Daily  . furosemide  80 mg Oral Daily  . latanoprost  1 drop Both Eyes QHS  . metoprolol tartrate  75 mg Oral BID  . olopatadine  1 drop Both Eyes BID  . potassium chloride  40 mEq Oral Daily  . Warfarin - Pharmacist Dosing Inpatient   Does not apply q1800   Continuous Infusions:  PRN Meds: acetaminophen **OR** acetaminophen, ondansetron **OR** ondansetron (ZOFRAN) IV, senna-docusate   Vital Signs    Vitals:   01/21/19 1938 01/22/19 0031 01/22/19 0420 01/22/19 0849  BP: 97/60 112/72 118/77 118/77  Pulse: 60 60 70 70  Resp: 20 18 18    Temp: (!) 97 F (36.1 C) (!) 97 F (36.1 C) (!) 97.4 F (36.3 C)   TempSrc:      SpO2: 100% 100% 100%   Weight:   56.7 kg   Height:        Intake/Output Summary (Last 24 hours) at 01/22/2019 0935 Last data filed at 01/22/2019 0700 Gross per 24 hour  Intake 480 ml  Output 1200 ml  Net -720 ml   Last 3 Weights 01/22/2019 01/21/2019 01/20/2019  Weight (lbs) 125 lb 1.6 oz 124 lb 124 lb 8 oz  Weight (kg) 56.745 kg 56.246 kg 56.473 kg      Telemetry    Atrial paced, ventricular sensed rhythm- Personally Reviewed  ECG    No new tracing- Personally Reviewed  Physical Exam  Appears comfortable and is smiling.  Extremely hard of hearing. GEN: No acute distress.   Neck: 4-5 cm JVD Cardiac: RRR, paradoxically split S2, 1-6/9 holosystolic murmur heard at the left lower sternal border as well as the right upper sternal border, no diastolic murmurs, rubs, or gallops.   Respiratory: Clear to auscultation bilaterally. GI: Soft, nontender, non-distended  MS: No edema; No deformity. Neuro:  Nonfocal  Psych: Normal affect   Labs    Chemistry Recent Labs  Lab 01/20/19 1407 01/21/19 0235 01/21/19 1434 01/22/19 0449  NA 143 144  --  143  K 3.5 2.8* 3.6 4.1  CL 103 103  --  101  CO2 24 29  --  29  GLUCOSE 98 118*  --  132*  BUN 32* 32*  --  32*  CREATININE 1.56* 1.61*  --  1.76*  CALCIUM 9.2 9.0  --  9.2  PROT  --  5.9*  --   --   ALBUMIN  --  3.5  --   --   AST  --  20  --   --   ALT  --  25  --   --   ALKPHOS  --  45  --   --   BILITOT  --  2.0*  --   --   GFRNONAA 28* 27*  --  24*  GFRAA 33* 32*  --  28*  ANIONGAP 16* 12  --  13     Hematology Recent Labs  Lab 01/20/19 1407 01/21/19 0235 01/22/19 0449  WBC 9.9 7.6 8.2  RBC 3.35*  3.28* 3.42*  HGB 10.7* 10.5* 11.0*  HCT 33.8* 33.0* 34.6*  MCV 100.9* 100.6* 101.2*  MCH 31.9 32.0 32.2  MCHC 31.7 31.8 31.8  RDW 13.5 13.4 13.6  PLT 164 147* 176    Cardiac Enzymes Recent Labs  Lab 01/20/19 1407  TROPONINI <0.03   No results for input(s): TROPIPOC in the last 168 hours.   BNP Recent Labs  Lab 01/20/19 1407  BNP 2,283.5*     DDimer No results for input(s): DDIMER in the last 168 hours.   Radiology    Dg Chest Port 1 View  Result Date: 01/20/2019 CLINICAL DATA:  Shortness of breath, chest pain. EXAM: PORTABLE CHEST 1 VIEW COMPARISON:  Radiographs of December 31, 2017. FINDINGS: Stable cardiomegaly. Atherosclerosis of thoracic aorta is noted. Left-sided pacemaker is unchanged in position. No pneumothorax is noted. Right lung is clear. Mild left basilar subsegmental atelectasis is noted with minimal left pleural effusion. Bony thorax is unremarkable. IMPRESSION: Mild left basilar subsegmental atelectasis with minimal left pleural effusion. Aortic Atherosclerosis (ICD10-I70.0). Electronically Signed   By: Marijo Conception M.D.   On: 01/20/2019 16:12    Cardiac Studies   Echo  01/21/2019  1. The left ventricle has severely reduced systolic function, with an ejection fraction of 20-25%. The cavity size was mildly dilated. Left ventricular diastolic Doppler parameters are consistent with restrictive filling. Elevated mean left atrial  pressure There is abnormal septal motion consistent with left bundle branch block. Left ventrical global hypokinesis without regional wall motion abnormalities.  2. Right ventricular systolic pressure is moderately elevated with an estimated pressure of 55 mmHg.  3. There is mild mitral annular calcification present. The MR jet is centrally-directed.  4. Tricuspid valve regurgitation is mild-moderate.  5. Mild thickening of the aortic valve Sclerosis without any evidence of stenosis of the aortic valve. Aortic valve regurgitation is trivial by color flow Doppler.  6. Left atrial size was severely dilated.  7. Right atrial size was mildly dilated.  8. When compared to the prior study: Previous images from 2009 are not available for comparison, but reported normal LVEF 55-60%. Studies from 2007 reported LVEF 35-40%.  Patient Profile     85 y.o. female with history of chronic systolic and diastolic heart failure, paroxysmal atrial fib with tachycardia-bradycardia syndrome, dual-chamber permanent pacemaker,, mild dementia, essential hypertension, dyslipidemia, CKD stage III, admitted with heart failure exacerbation.  Assessment & Plan    1. CHF:  She is no longer orthopneic and does not have overt findings of hypervolemia on physical exam.    Her weight is lower than her previously estimated "dry weight", which needs to be "recalibrated". Her BNP is markedly elevated but there is no baseline available.  Has been switched to oral diuretics.  To monitor for at least 24 hours.  Already on ACE inhibitor and beta-blocker, I doubt she would tolerate titration of these medications due to low blood pressure. 2. AFib: Very low burden of arrhythmia, no  significant events since February 7. CHADSVasc 5-6 (age 54, HF, HTN, gender, +/-CAD). 3. Anticoagulation: Well-tolerated no active bleeding problems, therapeutic INR without fail over the last 6 months. 4. Amiodarone: The dose changed just before admission has not yet had any meaningful impact and her current symptoms are not related to arrhythmia.  Normal liver function tests with a mild increase in bilirubin probably Gilbert syndrome. 5. SSS: Virtually 100% atrial paced with good heart rate histogram distribution on current sensor settings. 6. PM: Normal device function.  Virtually  no ventricular pacing.  She is receiving a new monitor for remote downloads, the next one will be due in early August. 7. Murmur:  Appears to be related to tricuspid regurgitation, there is no meaningful aortic valve disease. 8. CKD 3-4: Usual creatinine clearance is around 35, now creatinine slightly higher at 1.76 (baseline roughly 1.4), following diuresis.  Has been switched to oral diuretics and anticipate that this will improve. 9. Hypokalemia:  Has been repleted.     For questions or updates, please contact [place] Please consult [email or web address] for contact info under        Signed, Sanda Klein, MD  01/22/2019, 9:35 AM

## 2019-01-22 NOTE — Progress Notes (Signed)
[facility]                                                                              Patient Demographics  [name], is a 85 y.o. female, DOB - May 27, 1923, OAC:[id number]  Admit date - 01/20/2019   Admitting Physician Lavina Hamman, MD  Outpatient Primary MD for the patient is Chriss Czar, MD  Outpatient specialists:   LOS - 0  days   Medical records reviewed and are as summarized below:    Chief Complaint  Patient presents with  . Shortness of Breath       Brief summary   Patient is a 85 year old female with chronic systolic CHF, persistent A. fib, CKD, mild dementia, hip hypertension, dyslipidemia presented with shortness of breath and mild dizziness ongoing for 3 days prior to admission.  No fever chills coughing, chest pain, nausea vomiting.  Patient had been communicating with her PCP and cardiology about her symptoms and had been taking extra dose of Lasix.  She has been making adjustment in her amiodarone dose per her cardiologist recommendations.   Patient was given IV Lasix in ED and pacemaker was interrogated.  She was admitted for acute on chronic CHF.  Assessment & Plan    Principal Problem:   Acute on chronic systolic CHF (congestive heart failure) ([place]), cardiomyopathy -BNP was markedly elevated, chest x-ray with no significant pulmonary edema -Patient was initially placed on IV Lasix for diuresis, creatinine started trending up, hence Lasix was transitioned to oral.  Weight has not improved significantly -2D echo showed severely depressed systolic function EF of 20 to 25%, global left ventricular hypokinesis without regional wall motion abnormalities, previous studies in 2007 had shown EF of 35 to 40%. -Cardiology following, patient will need repeat echocardiogram.   -To oral diuretic however creatinine still trending up.  Cardiology recommended to monitor for at least 24 hours.   -BP soft, continue Lopressor.  If BP tolerates,  will restart lisinopril 5 mg daily, doubt she will be able to titrate up the medications due to BP   Active Problems: Hypokalemia -Resolved 4.1 today    PAF (paroxysmal atrial fibrillation) (HCC) -Rate controlled, continue amiodarone and beta-blocker -Continue Coumadin per pharmacy - [place] VASC 5, continue Coumadin   History of sick sinus syndrome with pacemaker -Cardiology following, pacemaker interrogated, normal device function, no ventricular pacing    Dyslipidemia -Continue Lipitor    Dementia without behavioral disturbance (HCC) -Currently stable, no acute issues     Essential hypertension -Stable, continue beta-blocker  CKD stage III Baseline creatinine 1.5, currently trending up, 1.7, likely worsened due to diuresis Lisinopril is held, transitioned to oral Lasix Monitor overnight, hopefully creatinine will improve.  Code Status: Full CODE STATUS DVT Prophylaxis: Coumadin family Communication: Discussed in detail with the patient, all imaging results, lab results explained to the patient   Disposition Plan:   Patient has been seen by cardiology, as creatinine is trending up, recommended monitoring for 24 hours.  Holding ACE inhibitor, Lasix has been transitioned to oral   Time Spent in minutes 25 minutes  Procedures:  2D Echo  Consultants:   Cardiology  Antimicrobials:   Anti-infectives (From admission, onward)   None         Medications  Scheduled Meds: . amiodarone  200 mg Oral Daily  . atorvastatin  5 mg Oral QHS  . brimonidine  1 drop Both Eyes Daily  . furosemide  80 mg Oral Daily  . latanoprost  1 drop Both Eyes QHS  . metoprolol tartrate  75 mg Oral BID  . olopatadine  1 drop Both Eyes BID  . potassium chloride  40 mEq Oral Daily  . warfarin  2 mg Oral ONCE-1800  . Warfarin - Pharmacist Dosing Inpatient   Does not apply q1800   Continuous Infusions: PRN Meds:.acetaminophen **OR** acetaminophen, ondansetron **OR** ondansetron (ZOFRAN)  IV, senna-docusate      Subjective:   [name] was seen and examined today.  Feeling somewhat better from yesterday.  No chest pain, shortness of breath is improving.  Still somewhat weak and not at baseline yet.  Patient denies dizziness, abdominal pain, N/V/D/C, new weakness, numbess, tingling. No acute events overnight.    Objective:   Vitals:   01/22/19 0420 01/22/19 0849 01/22/19 1137 01/22/19 1258  BP: 118/77 118/77 110/70 106/68  Pulse: 70 70 (!) 59 67  Resp: 18  20   Temp: (!) 97.4 F (36.3 C)  98 F (36.7 C)   TempSrc:   Oral   SpO2: 100%  100% 100%  Weight: 56.7 kg     Height:        Intake/Output Summary (Last 24 hours) at 01/22/2019 1410 Last data filed at 01/22/2019 0900 Gross per 24 hour  Intake 600 ml  Output 900 ml  Net -300 ml     Wt Readings from Last 3 Encounters:  01/22/19 56.7 kg  01/13/19 58.5 kg  01/10/19 57.2 kg   Physical Exam  General: Alert and oriented x 3, NAD  Eyes: ,  HEENT:    Cardiovascular: S1 S2 clear, 3/6 SEM, no pedal edema b/l  Respiratory: CTAB, no wheezing, rales or rhonchi  Gastrointestinal: Soft, nontender, nondistended, NBS  Ext: no pedal edema bilaterally  Neuro: no new deficits  Musculoskeletal: No cyanosis, clubbing  Skin: No rashes  Psych: Normal affect and demeanor, alert and oriented x3     Data Reviewed:  I have personally reviewed following labs and imaging studies  Micro Results Recent Results (from the past 240 hour(s))  SARS Coronavirus 2 [facility] order, Performed in [place] hospital lab)     Status: None   Collection Time: 01/20/19  6:46 PM  Result Value Ref Range Status   SARS Coronavirus 2 NEGATIVE NEGATIVE Final    Comment: (NOTE) If result is NEGATIVE SARS-CoV-2 target nucleic acids are NOT DETECTED. The SARS-CoV-2 RNA is generally detectable in upper and lower  respiratory specimens during the acute phase of infection. The lowest  concentration of SARS-CoV-2 viral copies  this assay can detect is 250  copies / mL. A negative result does not preclude SARS-CoV-2 infection  and should not be used as the sole basis for treatment or other  patient management decisions.  A negative result may occur with  improper specimen collection / handling, submission of specimen other  than nasopharyngeal swab, presence of viral mutation(s) within the  areas targeted by this assay, and inadequate number of viral copies  (<250 copies / mL). A negative result must be combined with clinical  observations, patient history, and epidemiological information. If result is POSITIVE SARS-CoV-2 target nucleic acids are DETECTED. The SARS-CoV-2  RNA is generally detectable in upper and lower  respiratory specimens dur ing the acute phase of infection.  Positive  results are indicative of active infection with SARS-CoV-2.  Clinical  correlation with patient history and other diagnostic information is  necessary to determine patient infection status.  Positive results do  not rule out bacterial infection or co-infection with other viruses. If result is PRESUMPTIVE POSTIVE SARS-CoV-2 nucleic acids MAY BE PRESENT.   A presumptive positive result was obtained on the submitted specimen  and confirmed on repeat testing.  While 2019 novel coronavirus  (SARS-CoV-2) nucleic acids may be present in the submitted sample  additional confirmatory testing may be necessary for epidemiological  and / or clinical management purposes  to differentiate between  SARS-CoV-2 and other Sarbecovirus currently known to infect humans.  If clinically indicated additional testing with an alternate test  methodology [phone]) is advised. The SARS-CoV-2 RNA is generally  detectable in upper and lower respiratory sp ecimens during the acute  phase of infection. The expected result is Negative. Fact Sheet for Patients:  StrictlyIdeas.no Fact Sheet for Healthcare  Providers: BankingDealers.co.za This test is not yet approved or cleared by the [place] FDA and has been authorized for detection and/or diagnosis of SARS-CoV-2 by FDA under an Emergency Use Authorization (EUA).  This EUA will remain in effect (meaning this test can be used) for the duration of the COVID-19 declaration under Section 564(b)(1) of the Act, 21 U.S.C. section 360bbb-3(b)(1), unless the authorization is terminated or revoked sooner. Performed at [facility], [place] [address] [place], [place] [address]     Radiology Reports Dg Chest [place] 1 View  Result Date: 01/20/2019 CLINICAL DATA:  Shortness of breath, chest pain. EXAM: PORTABLE CHEST 1 VIEW COMPARISON:  Radiographs of December 31, 2017. FINDINGS: Stable cardiomegaly. Atherosclerosis of thoracic aorta is noted. Left-sided pacemaker is unchanged in position. No pneumothorax is noted. Right lung is clear. Mild left basilar subsegmental atelectasis is noted with minimal left pleural effusion. Bony thorax is unremarkable. IMPRESSION: Mild left basilar subsegmental atelectasis with minimal left pleural effusion. Aortic Atherosclerosis (ICD10-I70.0). Electronically Signed   By: Marijo Conception M.D.   On: 01/20/2019 16:12    Lab Data:  CBC: Recent Labs  Lab 01/20/19 1407 01/21/19 0235 01/22/19 0449  WBC 9.9 7.6 8.2  NEUTROABS 8.5*  --   --   HGB 10.7* 10.5* 11.0*  HCT 33.8* 33.0* 34.6*  MCV 100.9* 100.6* 101.2*  PLT 164 147* 562   Basic Metabolic Panel: Recent Labs  Lab 01/20/19 1407 01/21/19 0235 01/21/19 0645 01/21/19 1434 01/22/19 0449  NA 143 144  --   --  143  K 3.5 2.8*  --  3.6 4.1  CL 103 103  --   --  101  CO2 24 29  --   --  29  GLUCOSE 98 118*  --   --  132*  BUN 32* 32*  --   --  32*  CREATININE 1.56* 1.61*  --   --  1.76*  CALCIUM 9.2 9.0  --   --  9.2  MG  --   --  2.1  --  2.0   GFR: Estimated Creatinine Clearance: 17.9 mL/min (A) (by C-G formula based on SCr of  1.76 mg/dL (H)). Liver Function Tests: Recent Labs  Lab 01/21/19 0235  AST 20  ALT 25  ALKPHOS 45  BILITOT 2.0*  PROT 5.9*  ALBUMIN 3.5   No results for input(s): LIPASE,  AMYLASE in the last 168 hours. No results for input(s): AMMONIA in the last 168 hours. Coagulation Profile: Recent Labs  Lab 01/20/19 1407 01/21/19 0235 01/22/19 0449  INR 2.6* 2.7* 2.8*   Cardiac Enzymes: Recent Labs  Lab 01/20/19 1407  TROPONINI <0.03   BNP (last 3 results) No results for input(s): PROBNP in the last 8760 hours. HbA1C: No results for input(s): HGBA1C in the last 72 hours. CBG: No results for input(s): GLUCAP in the last 168 hours. Lipid Profile: No results for input(s): CHOL, HDL, LDLCALC, TRIG, CHOLHDL, LDLDIRECT in the last 72 hours. Thyroid Function Tests: No results for input(s): TSH, T4TOTAL, FREET4, T3FREE, THYROIDAB in the last 72 hours. Anemia Panel: No results for input(s): VITAMINB12, FOLATE, FERRITIN, TIBC, IRON, RETICCTPCT in the last 72 hours. Urine analysis:    Component Value Date/Time   COLORURINE YELLOW 01/21/2010 1424   APPEARANCEUR CLEAR 01/21/2010 1424   LABSPEC 1.013 01/21/2010 1424   PHURINE 5.5 01/21/2010 [place] 01/21/2010 [place] 01/21/2010 [place] 01/21/2010 [place] 01/21/2010 1424   PROTEINUR NEGATIVE 01/21/2010 1424   UROBILINOGEN 0.2 01/21/2010 1424   NITRITE NEGATIVE 01/21/2010 [place] 01/21/2010 [place] M.D. [facility] 01/22/2019, 2:10 PM  Pager: [phone] Between 7am to 7pm - call Pager - [phone]  After 7pm go to [email or web address] - password [email or web address]  Call night coverage person covering after 7pm

## 2019-01-22 NOTE — Progress Notes (Signed)
ANTICOAGULATION CONSULT NOTE - Initial Consult  Pharmacy Consult for Warfarin Indication: atrial fibrillation  No Known Allergies  Patient Measurements: Height: 5\' 5"  (165.1 cm) Weight: 125 lb 1.6 oz (56.7 kg)(scale a) IBW/kg (Calculated) : 57  Vital Signs: Temp: 97.4 F (36.3 C) (05/10 0420) BP: 110/70 (05/10 1137) Pulse Rate: 59 (05/10 1137)  Labs: Recent Labs    01/20/19 1407 01/21/19 0235 01/22/19 0449  HGB 10.7* 10.5* 11.0*  HCT 33.8* 33.0* 34.6*  PLT 164 147* 176  LABPROT 27.7* 28.4* 28.7*  INR 2.6* 2.7* 2.8*  CREATININE 1.56* 1.61* 1.76*  TROPONINI <0.03  --   --     Estimated Creatinine Clearance: 17.9 mL/min (A) (by C-G formula based on SCr of 1.76 mg/dL (H)).   Medical History: Past Medical History:  Diagnosis Date  . Anemia   . Arthritis    "knees especially"  . Bradycardia   . Cardiomyopathy    presumed ischemic in the past with an EF of 35%. The most recent EF in 03-2008, however, was 55 -60%  . Chronic renal insufficiency   . Dementia ([place])    mild  . Dyslipidemia   . Glaucoma   . Hypertension   . Malleolar fracture   . Paroxysmal atrial fibrillation [facility])     Assessment: 85 year old female admitted for acute on chronic systolic CHF and renal insufficiency on CKD stage III. Pt with hx of a fib, treated with warfarin, continuing warfarin on admission (last dose of warfarin was at 0830 AM on 01/20/19, per family).  Of note, amiodarone was increased from 100mg  5days/week to 200mg  daily on 5/7.  INR therapeutic at 2.8. No signs of bleeding noted. Hemoglobin low stable at 11.   PTA regimen: 2mg  daily. Of note - per [facility]madin Clinic she can get 3 month supply of apixaban for $8.95. Patient declined switch at 01/03/19 visit but may be worth revisiting while inpatient.   Goal of Therapy:  INR 2-3   Plan:  Warfarin 2mg  tonight Check daily INR monitor H/H and symptoms of bleeding   Isaias Sakai, Pharm D PGY1 Pharmacy Resident  Phone [phone] Please use AMION for clinical pharmacists numbers  01/22/2019      11:43 AM

## 2019-01-23 LAB — CBC
HCT: 34.5 % — ABNORMAL LOW (ref 36.0–46.0)
Hemoglobin: 10.9 g/dL — ABNORMAL LOW (ref 12.0–15.0)
MCH: 31.8 pg (ref 26.0–34.0)
MCHC: 31.6 g/dL (ref 30.0–36.0)
MCV: 100.6 fL — ABNORMAL HIGH (ref 80.0–100.0)
Platelets: 182 10*3/uL (ref 150–400)
RBC: 3.43 MIL/uL — ABNORMAL LOW (ref 3.87–5.11)
RDW: 13.5 % (ref 11.5–15.5)
WBC: 9.7 10*3/uL (ref 4.0–10.5)
nRBC: 0 % (ref 0.0–0.2)

## 2019-01-23 LAB — BASIC METABOLIC PANEL
Anion gap: 9 (ref 5–15)
BUN: 31 mg/dL — ABNORMAL HIGH (ref 8–23)
CO2: 29 mmol/L (ref 22–32)
Calcium: 9.3 mg/dL (ref 8.9–10.3)
Chloride: 105 mmol/L (ref 98–111)
Creatinine, Ser: 1.57 mg/dL — ABNORMAL HIGH (ref 0.44–1.00)
GFR calc Af Amer: 33 mL/min — ABNORMAL LOW (ref >60)
GFR calc non Af Amer: 28 mL/min — ABNORMAL LOW (ref >60)
Glucose, Bld: 118 mg/dL — ABNORMAL HIGH (ref 70–99)
Potassium: 3.6 mmol/L (ref 3.5–5.1)
Sodium: 143 mmol/L (ref 135–145)

## 2019-01-23 LAB — PROTIME-INR
INR: 3.1 — ABNORMAL HIGH (ref 0.8–1.2)
Prothrombin Time: 31.5 seconds — ABNORMAL HIGH (ref 11.4–15.2)

## 2019-01-23 MED ORDER — LISINOPRIL 5 MG PO TABS: ORAL_TABLET | ORAL | 0 refills | Status: DC

## 2019-01-23 MED ORDER — AMIODARONE HCL 200 MG PO TABS: 200.0000 mg | ORAL_TABLET | Freq: Every day | ORAL | 3 refills | Status: DC

## 2019-01-23 MED ORDER — LISINOPRIL 5 MG PO TABS: 5.0000 mg | ORAL_TABLET | Freq: Every day | ORAL | 0 refills | Status: DC

## 2019-01-23 MED ORDER — WARFARIN SODIUM 1 MG PO TABS
1.0000 mg | ORAL_TABLET | Freq: Once | ORAL | Status: DC
  Filled 2019-01-23: qty 1

## 2019-01-23 NOTE — Progress Notes (Signed)
Orders faxed to [facility] as requested. Fax [phone]; Mindi Slicker RN,MHA,BSn [phone]

## 2019-01-23 NOTE — Progress Notes (Signed)
Progress Note  Patient Name: [name] Date of Encounter: 01/23/2019  Primary Cardiologist: Minus Breeding, MD   Subjective   Doing well, awaiting help changing into her clothes to go home. No complaints this AM.  Inpatient Medications    Scheduled Meds: . amiodarone  200 mg Oral Daily  . atorvastatin  5 mg Oral QHS  . brimonidine  1 drop Both Eyes Daily  . furosemide  80 mg Oral Daily  . latanoprost  1 drop Both Eyes QHS  . metoprolol tartrate  75 mg Oral BID  . olopatadine  1 drop Both Eyes BID  . potassium chloride  40 mEq Oral Daily  . warfarin  1 mg Oral ONCE-1800  . Warfarin - Pharmacist Dosing Inpatient   Does not apply q1800   Continuous Infusions:  PRN Meds: acetaminophen **OR** acetaminophen, ondansetron **OR** ondansetron (ZOFRAN) IV, senna-docusate   Vital Signs    Vitals:   01/22/19 1258 01/22/19 2052 01/23/19 0430 01/23/19 0850  BP: 106/68 116/69 127/77 116/63  Pulse: 67 60 63 62  Resp: 20 16 16    Temp: 98 F (36.7 C) (!) 97.5 F (36.4 C) 98.2 F (36.8 C)   TempSrc: Oral Oral    SpO2: 100% 100% 100%   Weight:   56.1 kg   Height:        Intake/Output Summary (Last 24 hours) at 01/23/2019 1042 Last data filed at 01/23/2019 0851 Gross per 24 hour  Intake 480 ml  Output 1826 ml  Net -1346 ml   Last 3 Weights 01/23/2019 01/22/2019 01/21/2019  Weight (lbs) 123 lb 9.6 oz 125 lb 1.6 oz 124 lb  Weight (kg) 56.065 kg 56.745 kg 56.246 kg      Telemetry    Atrial paced, ventricular sensed rhythm- Personally Reviewed  ECG    No new tracing- Personally Reviewed  Physical Exam   GEN: No acute distress.  Very hard of hearing. Neck: flat at 90 degress Cardiac: RRR, paradoxically split S2, 2-8/7 holosystolic murmur heard at the left lower sternal border as well as the right upper sternal border, no diastolic murmurs, rubs, or gallops.  Respiratory: Clear to auscultation bilaterally. GI: Soft, nontender, non-distended  MS: No edema; No  deformity. Neuro:  Nonfocal  Psych: Normal affect   Labs    Chemistry Recent Labs  Lab 01/21/19 0235 01/21/19 1434 01/22/19 0449 01/23/19 0706  NA 144  --  143 143  K 2.8* 3.6 4.1 3.6  CL 103  --  101 105  CO2 29  --  29 29  GLUCOSE 118*  --  132* 118*  BUN 32*  --  32* 31*  CREATININE 1.61*  --  1.76* 1.57*  CALCIUM 9.0  --  9.2 9.3  PROT 5.9*  --   --   --   ALBUMIN 3.5  --   --   --   AST 20  --   --   --   ALT 25  --   --   --   ALKPHOS 45  --   --   --   BILITOT 2.0*  --   --   --   GFRNONAA 27*  --  24* 28*  GFRAA 32*  --  28* 33*  ANIONGAP 12  --  13 9     Hematology Recent Labs  Lab 01/21/19 0235 01/22/19 0449 01/23/19 0706  WBC 7.6 8.2 9.7  RBC 3.28* 3.42* 3.43*  HGB 10.5* 11.0* 10.9*  HCT 33.0* 34.6*  34.5*  MCV 100.6* 101.2* 100.6*  MCH 32.0 32.2 31.8  MCHC 31.8 31.8 31.6  RDW 13.4 13.6 13.5  PLT 147* 176 182    Cardiac Enzymes Recent Labs  Lab 01/20/19 1407  TROPONINI <0.03   No results for input(s): TROPIPOC in the last 168 hours.   BNP Recent Labs  Lab 01/20/19 1407  BNP 2,283.5*     DDimer No results for input(s): DDIMER in the last 168 hours.   Radiology    No results found.  Cardiac Studies   Echo 01/21/2019  1. The left ventricle has severely reduced systolic function, with an ejection fraction of 20-25%. The cavity size was mildly dilated. Left ventricular diastolic Doppler parameters are consistent with restrictive filling. Elevated mean left atrial  pressure There is abnormal septal motion consistent with left bundle branch block. Left ventrical global hypokinesis without regional wall motion abnormalities.  2. Right ventricular systolic pressure is moderately elevated with an estimated pressure of 55 mmHg.  3. There is mild mitral annular calcification present. The MR jet is centrally-directed.  4. Tricuspid valve regurgitation is mild-moderate.  5. Mild thickening of the aortic valve Sclerosis without any evidence of  stenosis of the aortic valve. Aortic valve regurgitation is trivial by color flow Doppler.  6. Left atrial size was severely dilated.  7. Right atrial size was mildly dilated.  8. When compared to the prior study: Previous images from 2009 are not available for comparison, but reported normal LVEF 55-60%. Studies from 2007 reported LVEF 35-40%.  Patient Profile     85 y.o. female with history of chronic systolic and diastolic heart failure, paroxysmal atrial fib with tachycardia-bradycardia syndrome, dual-chamber permanent pacemaker,, mild dementia, essential hypertension, dyslipidemia, CKD stage III, admitted with heart failure exacerbation.  Assessment & Plan    1. Acute on chronic systolic and diastolic heart failure:  Much improved symptomatically.  -lisinopril 5 mg ordered as an outpatient, borderline BP prevents uptitration  -continue metoprolol; given low EF, would consolidate to 150 mg metoprolol succinate daily.  -net negative 2.2 L, discharge weight 56.1 kg  2. AFib: Very low burden of arrhythmia.   -CHADSVasc 5-6 (age 29, HF, HTN, gender, +/-CAD).   -Continue amiodarone.  -Well-tolerated AC without active bleeding, continue coumadin.   -Of note, had been offered to switch to apixaban (3 mos cost of 8.95) in 12/2018, declined. With plans for long term amiodarone, would consider re-discussing this change at post-hospital follow up.  3. SSS s/p PPM: Virtually 100% atrial paced with good heart rate histogram distribution on current sensor settings. Normal device function.  She is receiving a new monitor for remote downloads, the next one will be due in early August.  4. CKD 3-4: Cr trending back down today, 1.57 from 1.76 yesterday, prior baseline around 1.4  [facility] will sign off in anticipation of discharge.   Medication Recommendations:  As ordered, with the exception of changing 75 mg metoprolol tartrate BID to 150 mg metoprolol succinate daily. Other recommendations  (labs, testing, etc):  INR per coumadin clinic Follow up as an outpatient:  We will arrange follow up with Dr. Rosezella [place] office and the anticoagulation clinic.  For questions or updates, please contact [place] Please consult [email or web address] for contact info under     Signed, Buford Dresser, MD  01/23/2019, 10:42 AM

## 2019-01-23 NOTE — Progress Notes (Addendum)
[place] for Warfarin Indication: atrial fibrillation  Patient Measurements: Height: 5\' 5"  (165.1 cm) Weight: 123 lb 9.6 oz (56.1 kg) IBW/kg (Calculated) : 57  Vital Signs: Temp: 98.2 F (36.8 C) (05/11 0430) Temp Source: Oral (05/10 2052) BP: 127/77 (05/11 0430) Pulse Rate: 63 (05/11 0430)  Labs: Recent Labs    01/20/19 1407 01/21/19 0235 01/22/19 0449 01/23/19 0706  HGB 10.7* 10.5* 11.0* 10.9*  HCT 33.8* 33.0* 34.6* 34.5*  PLT 164 147* 176 182  LABPROT 27.7* 28.4* 28.7* 31.5*  INR 2.6* 2.7* 2.8* 3.1*  CREATININE 1.56* 1.61* 1.76* 1.57*  TROPONINI <0.03  --   --   --      Assessment: 85 year old female admitted for acute on chronic systolic CHF and renal insufficiency on CKD stage III. Pt with hx of a fib, treated with warfarin, continuing warfarin on admission (last dose of warfarin was on 01/20/19, per family).  Of note, amiodarone was increased from 100mg  5days/week to 200mg  daily on 5/7.  INR slightly supratherapeutic at 3.1.  PTA regimen: 2mg  daily. Of note - per Coumadin Clinic she can get 3 month supply of apixaban for $8.95. Patient declined switch at 01/03/19 visit but may be worth revisiting while inpatient.    Goal of Therapy:  INR 2-3    Plan:  Warfarin 1 mg x1 Check daily INR monitor H/H and symptoms of bleeding    Harvel Quale 01/23/2019 8:50 AM

## 2019-01-23 NOTE — Discharge Summary (Signed)
Physician Discharge Summary   Patient ID: [name] MRN: [id number] DOB/AGE: 1923-02-14 85 y.o.  Admit date: 01/20/2019 Discharge date: 01/23/2019  Primary Care Physician:  Chriss Czar, MD   Recommendations for Outpatient Follow-up:  1. Follow up with PCP in 1-2 weeks 2.   Home Health: Home health PT OT Equipment/Devices:   Discharge Condition: stable  CODE STATUS: FULL Diet recommendation: Diet   Discharge Diagnoses:    Acute on chronic systolic and diastolic CHF exacerbation Hypokalemia . Dyslipidemia . PAF (paroxysmal atrial fibrillation) ([facility]) . Chronic renal insufficiency, stage III (moderate) (HCC) . History of SSS, status post pacemaker- [employer] . Essential hypertension . Glaucoma . Dementia without behavioral disturbance ([facility]) . Mild acute on CKD stage III   Consults: Cardiology    Allergies:  No Known Allergies   DISCHARGE MEDICATIONS: Allergies as of 01/23/2019   No Known Allergies     Medication List    TAKE these medications   acetaminophen 500 MG tablet Commonly known as:  TYLENOL Take 500 mg by mouth every 6 (six) hours as needed for headache (pain).   amiodarone 200 MG tablet Commonly known as:  PACERONE Take 1 tablet (200 mg total) by mouth daily.   atorvastatin 10 MG tablet Commonly known as:  LIPITOR TAKE 1/2 TABLET BY MOUTH EVERY NIGHT AT BEDTIME What changed:  See the new instructions.   brimonidine 0.2 % ophthalmic solution Commonly known as:  ALPHAGAN Place 1 drop into both eyes daily.   furosemide 80 MG tablet Commonly known as:  LASIX TAKE 1 TABLET(80 MG) BY MOUTH DAILY What changed:  See the new instructions.   lisinopril 5 MG tablet Commonly known as:  ZESTRIL Take 1 tablet (5 mg total) by mouth daily. Start taking on:  Jan 24, 2019 What changed:  See the new instructions.   metoprolol tartrate 50 MG tablet Commonly known as:  LOPRESSOR TAKE 1 AND 1/2 TABLETS(75 MG) BY MOUTH TWICE DAILY What changed:     how much to take  how to take this  when to take this  additional instructions   multivitamin with minerals Tabs tablet Take 1 tablet by mouth daily.   Pataday 0.2 % Soln Generic drug:  Olopatadine HCl Place 1 drop into both eyes daily.   potassium chloride SA 20 MEQ tablet Commonly known as:  K-DUR TAKE 1 TABLET BY MOUTH EVERY DAY   senna 8.6 MG Tabs tablet Commonly known as:  SENOKOT Take 1 tablet by mouth at bedtime as needed for mild constipation.   Travoprost (BAK Free) 0.004 % Soln ophthalmic solution Commonly known as:  TRAVATAN Place 1 drop into both eyes at bedtime.   warfarin 2 MG tablet Commonly known as:  COUMADIN Take as directed. If you are unsure how to take this medication, talk to your nurse or doctor. Original instructions:  TAKE 1/2 TO 1 TABLET BY MOUTH DAILY AS DIRECTED BY COUMADIN CLINIC What changed:    how much to take  how to take this  when to take this  additional instructions        Brief H and P: For complete details please refer to admission H and P, but in brief Patient is a 85 year old female with chronic systolic CHF, persistent A. fib, CKD, mild dementia, hip hypertension, dyslipidemia presented with shortness of breath and mild dizziness ongoing for 3 days prior to admission.  No fever chills coughing, chest pain, nausea vomiting.  Patient had been communicating with her PCP and cardiology about her  symptoms and had been taking extra dose of Lasix.  She has been making adjustment in her amiodarone dose per her cardiologist recommendations.   Patient was given IV Lasix in ED and pacemaker was interrogated.  She was admitted for acute on chronic CHF.  Hospital Course:   Acute on chronic systolic and diastolic CHF (congestive heart failure) ([place]), cardiomyopathy -BNP was markedly elevated at the time of admission 2283.5 however chest x-ray with no significant pulmonary edema -Patient was placed on IV Lasix for diuresis, negative  balance of 2.2 L at the time of discharge.   -Weight at the time of admission 126lbs, down to 123lbs today -Patient was transitioned to oral Lasix, continue beta-blocker -Patient was followed closely by cardiology, she has been transitioned to oral Lasix.  Creatinine trended up to 1.7 on 5/10 hence patient was monitored overnight.  Today creatinine function improving to 1.5 -2D echo showed EF of 20 to 25%, global hypokinesis without regional wall motion abnormalities, left ventricular diastolic parameters consistent with restrictive filling.  Hypokalemia -Improved likely due to IV diuresis -Potassium 3.6 at the time of discharge, patient will continue oral replacement with lead 6    PAF (paroxysmal atrial fibrillation) (HCC) -Currently stable, continue amiodarone and beta-blocker -Continue Coumadin   History of sick sinus syndrome with pacemaker -Patient was seen by cardiology, pacemaker was interrogated.  Normal device function, no ventricular pacing    Dyslipidemia -Continue Lipitor    Dementia without behavioral disturbance (HCC) -Currently stable, no acute issues    Essential hypertension -Stable, continue beta-blocker  CKD stage III Baseline creatinine 1.5, currently 1.6, likely slightly worsened due to diuresis Lisinopril was held.  Patient is now back to baseline 1.5  Day of Discharge S: Hoping to go home today, feels better.  No acute issues overnight  BP 116/63   Pulse 62   Temp 98.2 F (36.8 C)   Resp 16   Ht 5\' 5"  (1.651 m)   Wt 56.1 kg   SpO2 100%   BMI 20.57 kg/m   Physical Exam: General: Alert and awake oriented x3 not in any acute distress. HEENT: anicteric sclera, pupils reactive to light and accommodation CVS: S1-S2 clear no murmur rubs or gallops Chest: clear to auscultation bilaterally, no wheezing rales or rhonchi Abdomen: soft nontender, nondistended, normal bowel sounds Extremities: no cyanosis, clubbing or edema noted  bilaterally Neuro: Cranial nerves II-XII intact, no focal neurological deficits   The results of significant diagnostics from this hospitalization (including imaging, microbiology, ancillary and laboratory) are listed below for reference.      Procedures/Studies:  2D Echo 5/11 IMPRESSIONS    1. The left ventricle has severely reduced systolic function, with an ejection fraction of 20-25%. The cavity size was mildly dilated. Left ventricular diastolic Doppler parameters are consistent with restrictive filling. Elevated mean left atrial  pressure There is abnormal septal motion consistent with left bundle branch block. Left ventrical global hypokinesis without regional wall motion abnormalities.   Dg Chest Port 1 View  Result Date: 01/20/2019 CLINICAL DATA:  Shortness of breath, chest pain. EXAM: PORTABLE CHEST 1 VIEW COMPARISON:  Radiographs of December 31, 2017. FINDINGS: Stable cardiomegaly. Atherosclerosis of thoracic aorta is noted. Left-sided pacemaker is unchanged in position. No pneumothorax is noted. Right lung is clear. Mild left basilar subsegmental atelectasis is noted with minimal left pleural effusion. Bony thorax is unremarkable. IMPRESSION: Mild left basilar subsegmental atelectasis with minimal left pleural effusion. Aortic Atherosclerosis (ICD10-I70.0). Electronically Signed   By: Marijo Conception  M.D.   On: 01/20/2019 16:12       LAB RESULTS: Basic Metabolic Panel: Recent Labs  Lab 01/22/19 0449 01/23/19 0706  NA 143 143  K 4.1 3.6  CL 101 105  CO2 29 29  GLUCOSE 132* 118*  BUN 32* 31*  CREATININE 1.76* 1.57*  CALCIUM 9.2 9.3  MG 2.0  --    Liver Function Tests: Recent Labs  Lab 01/21/19 0235  AST 20  ALT 25  ALKPHOS 45  BILITOT 2.0*  PROT 5.9*  ALBUMIN 3.5   No results for input(s): LIPASE, AMYLASE in the last 168 hours. No results for input(s): AMMONIA in the last 168 hours. CBC: Recent Labs  Lab 01/20/19 1407  01/22/19 0449 01/23/19 0706   WBC 9.9   < > 8.2 9.7  NEUTROABS 8.5*  --   --   --   HGB 10.7*   < > 11.0* 10.9*  HCT 33.8*   < > 34.6* 34.5*  MCV 100.9*   < > 101.2* 100.6*  PLT 164   < > 176 182   < > = values in this interval not displayed.   Cardiac Enzymes: Recent Labs  Lab 01/20/19 1407  TROPONINI <0.03   BNP: Invalid input(s): POCBNP CBG: No results for input(s): GLUCAP in the last 168 hours.    Disposition and Follow-up: Discharge Instructions    (HEART FAILURE PATIENTS) Call MD:  Anytime you have any of the following symptoms: 1) 3 pound weight gain in 24 hours or 5 pounds in 1 week 2) shortness of breath, with or without a dry hacking cough 3) swelling in the hands, feet or stomach 4) if you have to sleep on extra pillows at night in order to breathe.   Complete by:  As directed    Diet - low sodium heart healthy   Complete by:  As directed    Increase activity slowly   Complete by:  As directed        DISPOSITION: Home with home health  [facility]    Chriss Czar, MD.   Specialty:  Family Medicine Contact information: [place] [address] [phone]        Minus Breeding, MD. Schedule an appointment as soon as possible for a visit in 1 week(s).   Specialty:  Cardiology Why:  or tele visit Contact information: [place] [address] [phone]        Deboraha Sprang, MD. Schedule an appointment as soon as possible for a visit in 1 week(s).   Specialty:  Cardiology Why:  or tele visit Contact information: [address] [place] [address] [phone]            Time coordinating discharge:  76mins   Signed:   Estill Cotta M.D. [facility] 01/23/2019, 11:13 AM

## 2019-01-23 NOTE — Discharge Instructions (Signed)
YOUR CARDIOLOGY TEAM HAS ARRANGED FOR AN E-VISIT FOR YOUR APPOINTMENT - PLEASE REVIEW IMPORTANT INFORMATION BELOW SEVERAL DAYS PRIOR TO YOUR APPOINTMENT  Due to the recent COVID-19 pandemic, we are transitioning in-person office visits to tele-medicine visits in an effort to decrease unnecessary exposure to our patients, their families, and staff. These visits are billed to your insurance just like a normal visit is. We also encourage you to sign up for MyChart if you have not already done so. You will need a smartphone if possible. For patients that do not have this, we can still complete the visit using a regular telephone but do prefer a smartphone to enable video when possible. You may have a family member that lives with you that can help. If possible, we also ask that you have a blood pressure cuff and scale at home to measure your blood pressure, heart rate and weight prior to your scheduled appointment. Patients with clinical needs that need an in-person evaluation and testing will still be able to come to the office if absolutely necessary. If you have any questions, feel free to call our office.   YOUR PROVIDER WILL BE USING ONE OF THE FOLLOWING PLATFORM TO COMPLETE YOUR VISIT: MyChart/Doximity/[email or web address] (our office will contact you with more information)   IF USING MYCHART - How to Download the MyChart App to Your SmartPhone   - If Apple, go to [employer] and type in MyChart in the search bar and download the app. If Android, ask patient to go to [employer] and type in [place] in the search bar and download the app. The app is free but as with any other app downloads, your phone may require you to verify saved payment information or Apple/Android password.  - You will need to then log into the app with your MyChart username and password, and select [facility] as your healthcare provider to link the account.  - When it is time for your visit, go to the MyChart app, find appointments, and  click Begin Video Visit. Be sure to Select Allow for your device to access the Microphone and Camera for your visit. You will then be connected, and your provider will be with you shortly.  **If you have any issues connecting or need assistance, please contact MyChart service desk [phone])**  **If using a computer, in order to ensure the best quality for your visit, you will need to use either of the following Internet Browsers: Insurance underwriter or Microsoft Edge**   IF USING DOXIMITY or [email or web address] - The staff will give you instructions on receiving your link to join the meeting the day of your visit.      2-3 DAYS BEFORE YOUR APPOINTMENT  You will receive a telephone call from one of our [facility] team members - your caller ID may say "Unknown caller." If this is a video visit, we will walk you through how to get the video launched on your phone. We will remind you check your blood pressure, heart rate and weight prior to your scheduled appointment. If you have an Apple Watch or Kardia, please upload any pertinent ECG strips the day before or morning of your appointment to [facility]. Our staff will also make sure you have reviewed the consent and agree to move forward with your scheduled tele-health visit.     THE DAY OF YOUR APPOINTMENT  Approximately 15 minutes prior to your scheduled appointment, you will receive a telephone call from one of [facility] team -  your caller ID may say "Unknown caller."  Our staff will confirm medications, vital signs for the day and any symptoms you may be experiencing. Please have this information available prior to the time of visit start. It may also be helpful for you to have a pad of paper and pen handy for any instructions given during your visit. They will also walk you through joining the smartphone meeting if this is a video visit.    CONSENT FOR TELE-HEALTH VISIT - PLEASE REVIEW  I hereby voluntarily request, consent and authorize [facility] and its employed or contracted physicians, physician assistants, nurse practitioners or other licensed health care professionals (the Practitioner), to provide me with telemedicine health care services (the Services") as deemed necessary by the treating Practitioner. I acknowledge and consent to receive the Services by the Practitioner via telemedicine. I understand that the telemedicine visit will involve communicating with the Practitioner through live audiovisual communication technology and the disclosure of certain medical information by electronic transmission. I acknowledge that I have been given the opportunity to request an in-person assessment or other available alternative prior to the telemedicine visit and am voluntarily participating in the telemedicine visit.  I understand that I have the right to withhold or withdraw my consent to the use of telemedicine in the course of my care at any time, without affecting my right to future care or treatment, and that the Practitioner or I may terminate the telemedicine visit at any time. I understand that I have the right to inspect all information obtained and/or recorded in the course of the telemedicine visit and may receive copies of available information for a reasonable fee.  I understand that some of the potential risks of receiving the Services via telemedicine include:   Delay or interruption in medical evaluation due to technological equipment failure or disruption;  Information transmitted may not be sufficient (e.g. poor resolution of images) to allow for appropriate medical decision making by the Practitioner; and/or   In rare instances, security protocols could fail, causing a breach of personal health information.  Furthermore, I acknowledge that it is my responsibility to provide information about my medical history, conditions and care that is complete and accurate to the best of my ability. I acknowledge that Practitioner's  advice, recommendations, and/or decision may be based on factors not within their control, such as incomplete or inaccurate data provided by me or distortions of diagnostic images or specimens that may result from electronic transmissions. I understand that the practice of medicine is not an exact science and that Practitioner makes no warranties or guarantees regarding treatment outcomes. I acknowledge that I will receive a copy of this consent concurrently upon execution via email to the email address I last provided but may also request a printed copy by calling the office of [facility].    I understand that my insurance will be billed for this visit.   I have read or had this consent read to me.  I understand the contents of this consent, which adequately explains the benefits and risks of the Services being provided via telemedicine.   I have been provided ample opportunity to ask questions regarding this consent and the Services and have had my questions answered to my satisfaction.  I give my informed consent for the services to be provided through the use of telemedicine in my medical care  By participating in this telemedicine visit I agree to the above.

## 2019-01-24 ENCOUNTER — Ambulatory Visit (INDEPENDENT_AMBULATORY_CARE_PROVIDER_SITE_OTHER): Payer: Medicare Other | Admitting: *Deleted

## 2019-01-24 ENCOUNTER — Other Ambulatory Visit: Payer: Self-pay

## 2019-01-24 ENCOUNTER — Telehealth: Payer: Self-pay

## 2019-01-24 ENCOUNTER — Telehealth: Payer: Self-pay | Admitting: Pharmacist

## 2019-01-24 DIAGNOSIS — I4891 Unspecified atrial fibrillation: Secondary | ICD-10-CM

## 2019-01-24 DIAGNOSIS — I428 Other cardiomyopathies: Secondary | ICD-10-CM

## 2019-01-24 NOTE — Telephone Encounter (Signed)
Spoke with pt's daughter regarding her upcoming TSH lab. I rescheduled her to have it drawn on the same day as her next coumadin check. She states medtronic helped her set up the remote check and it went through. Pt does not have to come in for a device check tomorrow. We reviewed discharge medication instructions for potassium, 37mEq daily.   Pt's daughter has verbalized understanding and has no additional questions.

## 2019-01-24 NOTE — Telephone Encounter (Signed)
Spoke with patient's daughter. Moved coumadin clinic appointment since patient was recently in hospital and we have an INR from yesterday. Rescheduled for 3 weeks due to increased amiodarone dose. 02/14/2019  Patients daughter asking if she still needs to come tomorrow for appointment for device check and lab. I will send to device clinic and Dr. Jens Som RN for further instruction

## 2019-01-24 NOTE — Telephone Encounter (Signed)
Transmission successful. Normal device function. Lead trends stable. No episodes since last check on 01/21/19. Next transmission on 04/26/19.

## 2019-01-24 NOTE — Telephone Encounter (Signed)
DOESN'T ACCEPT BLOCKED CALLS

## 2019-01-25 ENCOUNTER — Other Ambulatory Visit: Payer: Medicare Other

## 2019-01-25 LAB — CUP PACEART REMOTE DEVICE CHECK
Battery Impedance: 612 Ohm
Battery Remaining Longevity: 79 mo
Battery Voltage: 2.77 V
Brady Statistic AP VP Percent: 0 %
Brady Statistic AP VS Percent: 99 %
Brady Statistic AS VP Percent: 0 %
Brady Statistic AS VS Percent: 0 %
Date Time Interrogation Session: 20200512164001
Implantable Lead Implant Date: 20130103
Implantable Lead Implant Date: 20130103
Implantable Lead Location: 753859
Implantable Lead Location: 753860
Implantable Lead Model: 1944
Implantable Lead Model: 1948
Implantable Pulse Generator Implant Date: 20130103
Lead Channel Impedance Value: 411 Ohm
Lead Channel Impedance Value: 738 Ohm
Lead Channel Pacing Threshold Amplitude: 0.375 V
Lead Channel Pacing Threshold Amplitude: 0.625 V
Lead Channel Pacing Threshold Pulse Width: 0.4 ms
Lead Channel Pacing Threshold Pulse Width: 0.4 ms
Lead Channel Setting Pacing Amplitude: 2 V
Lead Channel Setting Pacing Amplitude: 2.5 V
Lead Channel Setting Pacing Pulse Width: 0.4 ms
Lead Channel Setting Sensing Sensitivity: 5.6 mV

## 2019-01-31 ENCOUNTER — Other Ambulatory Visit: Payer: Medicare Other

## 2019-02-02 ENCOUNTER — Telehealth: Payer: Self-pay | Admitting: Cardiology

## 2019-02-03 NOTE — Telephone Encounter (Signed)
call daughter's phone [phone] consent/ my chart via emailed/ pre reg completed

## 2019-02-06 ENCOUNTER — Other Ambulatory Visit: Payer: Self-pay | Admitting: Cardiology

## 2019-02-06 NOTE — Progress Notes (Signed)
Virtual Visit via Video Note   This visit type was conducted due to national recommendations for restrictions regarding the COVID-19 Pandemic (e.g. social distancing) in an effort to limit this patient's exposure and mitigate transmission in our community.  Due to her co-morbid illnesses, this patient is at least at moderate risk for complications without adequate follow up.  This format is felt to be most appropriate for this patient at this time.  All issues noted in this document were discussed and addressed.  A limited physical exam was performed with this format.  Please refer to the patient's chart for her consent to telehealth for [facility].   Date:  02/07/2019   ID:  [name], DOB 12-26-1922, MRN [id number]  Patient Location: Home Provider Location: Home  PCP:  Chriss Czar, MD  Cardiologist:  Minus Breeding, MD  Electrophysiologist:  Virl Axe, MD   Evaluation Performed:  Follow-Up Visit  Chief Complaint:  SOB  History of Present Illness:    [name] is a 85 y.o. female who presents for follow up of tachybrady syndrome.  Since I last saw her she was in the hospital with acute on chronic systolic and diastolic HF.  She was diuresed.    I did review her records and I do not see that she was in fibrillation or that this was a rhythm issue that precipitated this.  She had some dose changes of her amiodarone down and then back up to 200 mg.  She has had some paroxysmal atrial fibrillation but the last seem to be recorded in February.  In the hospital she was diuresed.  She was sent home with a slightly different timing of her warfarin which was taken in the evening mild.  Her metoprolol timing was changed a little bit.  I reviewed her hospital records and I do not see any profound precipitating factors.  Her daughter says she was good about her salt.  Her weights have been relatively the same.  She had exhibited some symptoms and suggestion that she was short of  breath a couple of days before.  She was leading up a little bit to sleep.  She had not had any significant increased swelling.  Now she feels back to baseline.  She is not having any chest pain, neck or arm discomfort.  She is not having any palpitations, presyncope or syncope.  As usual she says "I am blessed."  The patient does not have symptoms concerning for COVID-19 infection (fever, chills, cough, or new shortness of breath).    Past Medical History:  Diagnosis Date  . Anemia   . Arthritis    "knees especially"  . Bradycardia   . Cardiomyopathy    presumed ischemic in the past with an EF of 35%. The most recent EF in 03-2008, however, was 55 -60%  . Chronic renal insufficiency   . Dementia ([place])    mild  . Dyslipidemia   . Glaucoma   . Hypertension   . Malleolar fracture   . Paroxysmal atrial fibrillation [facility])    Past Surgical History:  Procedure Laterality Date  . ABDOMINAL HYSTERECTOMY    . CARDIOVERSION  07/2011  . EYE SURGERY  2002   "blood clot behind eye"  . FRACTURE SURGERY  ~ 2007   RLE  . HEMORRHOID SURGERY    . INSERT / REPLACE / REMOVE PACEMAKER  09/17/11   initial placement  . INSERT / REPLACE / REMOVE PACEMAKER  09/18/11  . PACEMAKER REVISION  N/A 09/18/2011   Procedure: PACEMAKER REVISION;  Surgeon: Thompson Grayer, MD;  Location: [facility] CATH LAB;  Service: Cardiovascular;  Laterality: N/A;  . PERMANENT PACEMAKER INSERTION N/A 09/17/2011   Procedure: PERMANENT PACEMAKER INSERTION;  Surgeon: Deboraha Sprang, MD;  Location: [facility] CATH LAB;  Service: Cardiovascular;  Laterality: N/A;  . TONSILLECTOMY AND ADENOIDECTOMY      Prior to Admission medications   Medication Sig Start Date End Date Taking? Authorizing Provider  acetaminophen (TYLENOL) 500 MG tablet Take 500 mg by mouth every 6 (six) hours as needed for headache (pain).   Yes [provider]  amiodarone (PACERONE) 200 MG tablet Take 1 tablet (200 mg total) by mouth daily. 01/23/19  Yes Rai, Ripudeep K, MD   atorvastatin (LIPITOR) 10 MG tablet TAKE 1/2 TABLET BY MOUTH EVERY NIGHT AT BEDTIME Patient taking differently: Take 5 mg by mouth at bedtime.  10/03/18  Yes Minus Breeding, MD  brimonidine (ALPHAGAN) 0.2 % ophthalmic solution Place 1 drop into both eyes daily. 04/02/16  Yes [provider]  furosemide (LASIX) 80 MG tablet TAKE 1 TABLET(80 MG) BY MOUTH DAILY Patient taking differently: Take 80 mg by mouth daily.  03/24/18  Yes Minus Breeding, MD  lisinopril (ZESTRIL) 5 MG tablet Take 1 tablet (5 mg total) by mouth daily. 01/24/19  Yes Rai, Ripudeep K, MD  metoprolol tartrate (LOPRESSOR) 50 MG tablet TAKE 1 AND 1/2 TABLETS(75 MG) BY MOUTH TWICE DAILY Patient taking differently: Take 75 mg by mouth 2 (two) times daily.  06/06/18  Yes Minus Breeding, MD  Multiple Vitamin (MULTIVITAMIN WITH MINERALS) TABS tablet Take 1 tablet by mouth daily.   Yes [provider]  Olopatadine HCl (PATADAY) 0.2 % SOLN Place 1 drop into both eyes daily.    Yes [provider]  potassium chloride SA (K-DUR,KLOR-CON) 20 MEQ tablet TAKE 1 TABLET BY MOUTH EVERY DAY Patient taking differently: Take 20 mEq by mouth daily.  12/19/18  Yes Minus Breeding, MD  senna (SENOKOT) 8.6 MG TABS tablet Take 1 tablet by mouth at bedtime as needed for mild constipation.   Yes [provider]  Travoprost, BAK Free, (TRAVATAN) 0.004 % SOLN ophthalmic solution Place 1 drop into both eyes at bedtime.   Yes [provider]  warfarin (COUMADIN) 2 MG tablet TAKE 1 TABLET BY MOUTH DAILY OR AS DIRECTED BY COUMADIN CLINIC 02/07/19  Yes Minus Breeding, MD     Allergies:   Patient has no known allergies.   Social History   Tobacco Use  . Smoking status: Never Smoker  . Smokeless tobacco: Never Used  Substance Use Topics  . Alcohol use: No  . Drug use: No     Family Hx: The patient's family history includes Other in an other family member.  ROS:   Please see the history of present illness.     As stated in the HPI and negative for all other systems.   Prior CV studies:   The following studies were reviewed today:    Labs/Other Tests and Data Reviewed:    EKG:  01/20/19  AV pacing rate 77.   Recent Labs: 05/25/2018: TSH 5.580 01/20/2019: B Natriuretic Peptide 2,283.5 01/21/2019: ALT 25 01/22/2019: Magnesium 2.0 01/23/2019: BUN 31; Creatinine, Ser 1.57; Hemoglobin 10.9; Platelets 182; Potassium 3.6; Sodium 143   Recent Lipid Panel Lab Results  Component Value Date/Time   CHOL 135 10/31/2015 12:02 PM   TRIG 55 10/31/2015 12:02 PM   HDL 96 10/31/2015 12:02 PM   CHOLHDL 1.4  10/31/2015 12:02 PM   LDLCALC 28 10/31/2015 12:02 PM    Wt Readings from Last 3 Encounters:  02/07/19 121 lb 8 oz (55.1 kg)  01/23/19 123 lb 9.6 oz (56.1 kg)  01/13/19 129 lb (58.5 kg)     Objective:    Vital Signs:  BP 140/79   Pulse 65   Wt 121 lb 8 oz (55.1 kg)   BMI 20.22 kg/m    VITAL SIGNS:  reviewed GEN:  no acute distress EYES:  sclerae anicteric, EOMI - Extraocular Movements Intact NEURO:  alert and oriented x 3, no obvious focal deficit PSYCH:  normal affect  ASSESSMENT & PLAN:    Acute on chronic systolic and diastolic CHF (congestive heart failure) ([place]), -    I discussed this at length with her daughter.  She did have a few signs that she might of been volume overloaded.  We discussed again PRN dosing of her diuretic not just dependent on her weight.  When she comes back for blood work in a couple of weeks I will get a basic metabolic profile.  PAF (paroxysmal atrial fibrillation) ([place]) -   I wait for the next device interrogation but currently think she is maintaining sinus rhythm and will continue the 20 mg amiodarone.   History of sick sinus syndrome with pacemaker   she is up-to-date with follow-up.  Dyslipidemia    continue current Lipitor.  CKD stage III     she will get a basic metabolic profile on June 2.  COVID-19 Education: The signs and symptoms of COVID-19  were discussed with the patient and how to seek care for testing (follow up with PCP or arrange E-visit).  The importance of social distancing was discussed today.  Time:   Today, I have spent 30 minutes with the patient with telehealth technology discussing the above problems.     Medication Adjustments/Labs and Tests Ordered: Current medicines are reviewed at length with the patient today.  Concerns regarding medicines are outlined above.   Tests Ordered: No orders of the defined types were placed in this encounter.   Medication Changes: No orders of the defined types were placed in this encounter.   Disposition:  Follow up with me in the office in 2 months.  Signed, Minus Breeding, MD  02/07/2019 10:48 AM    [facility]

## 2019-02-07 ENCOUNTER — Telehealth (INDEPENDENT_AMBULATORY_CARE_PROVIDER_SITE_OTHER): Payer: Medicare Other | Admitting: Cardiology

## 2019-02-07 ENCOUNTER — Encounter: Payer: Self-pay | Admitting: Cardiology

## 2019-02-07 VITALS — BP 140/79 | HR 65 | Wt 121.5 lb

## 2019-02-07 DIAGNOSIS — I5023 Acute on chronic systolic (congestive) heart failure: Secondary | ICD-10-CM | POA: Diagnosis not present

## 2019-02-07 DIAGNOSIS — Z7189 Other specified counseling: Secondary | ICD-10-CM

## 2019-02-07 DIAGNOSIS — I4891 Unspecified atrial fibrillation: Secondary | ICD-10-CM

## 2019-02-07 NOTE — Patient Instructions (Signed)
Medication Instructions:  Continue current medications  If you need a refill on your cardiac medications before your next appointment, please call your pharmacy.  Labwork: None Ordered  Testing/Procedures: None Ordered  Follow-Up:  . Your physician recommends that you schedule a follow-up appointment in: 2 Months   At [facility], you and your health needs are our priority.  As part of our continuing mission to provide you with exceptional heart care, we have created designated Provider Care Teams.  These Care Teams include your primary Cardiologist (physician) and Advanced Practice Providers (APPs -  Physician Assistants and Nurse Practitioners) who all work together to provide you with the care you need, when you need it.  Thank you for choosing [facility] at [facility]!!

## 2019-02-07 NOTE — Addendum Note (Signed)
Addended by: Vennie Homans on: 02/07/2019 11:25 AM   Modules accepted: Orders

## 2019-02-07 NOTE — Progress Notes (Signed)
Remote pacemaker transmission.   

## 2019-02-09 ENCOUNTER — Telehealth: Payer: Self-pay | Admitting: Pharmacist

## 2019-02-09 NOTE — Telephone Encounter (Signed)
1. COVID-19 Pre-Screening Questions:  . In the past 7 to 10 days have you had a cough,  shortness of breath, headache, congestion, fever (100 or greater) body aches, chills, sore throat, or sudden loss of taste or sense of smell? no . Have you been around anyone with known Covid 19. no . Have you been around anyone who is awaiting Covid 19 test results in the past 7 to 10 days? no . Have you been around anyone who has been exposed to Covid 19, or has mentioned symptoms of Covid 19 within the past 7 to 10 days? no   2. Pt advised of visitor restrictions (no visitors allowed except if needed to conduct the visit). Also advised to arrive at appointment time and wear a mask.   3. Patient aware of in-office visit   

## 2019-02-10 ENCOUNTER — Telehealth: Payer: Self-pay | Admitting: *Deleted

## 2019-02-10 NOTE — Telephone Encounter (Signed)
Patient's appointment needs to be rescheduled for different time. Left message with call back instructions.

## 2019-02-10 NOTE — Telephone Encounter (Signed)

## 2019-02-13 NOTE — Telephone Encounter (Signed)
    COVID-19 Pre-Screening Questions:  . In the past 7 to 10 days have you had a cough,  shortness of breath, headache, congestion, fever (100 or greater) body aches, chills, sore throat, or sudden loss of taste or sense of smell? . Have you been around anyone with known Covid 19. . Have you been around anyone who is awaiting Covid 19 test results in the past 7 to 10 days? . Have you been around anyone who has been exposed to Covid 19, or has mentioned symptoms of Covid 19 within the past 7 to 10 days?  If you have any concerns/questions about symptoms patients report during screening (either on the phone or at threshold). Contact the provider seeing the patient or DOD for further guidance.  If neither are available contact a member of the leadership team.          Contacted via telephone all Covid 19 questions were answered no. Has her own mask.  KB

## 2019-02-14 ENCOUNTER — Other Ambulatory Visit: Payer: Self-pay

## 2019-02-14 ENCOUNTER — Ambulatory Visit (INDEPENDENT_AMBULATORY_CARE_PROVIDER_SITE_OTHER): Payer: Medicare Other

## 2019-02-14 ENCOUNTER — Other Ambulatory Visit: Payer: Medicare Other

## 2019-02-14 DIAGNOSIS — I428 Other cardiomyopathies: Secondary | ICD-10-CM

## 2019-02-14 DIAGNOSIS — I4891 Unspecified atrial fibrillation: Secondary | ICD-10-CM | POA: Diagnosis not present

## 2019-02-14 DIAGNOSIS — I495 Sick sinus syndrome: Secondary | ICD-10-CM

## 2019-02-14 DIAGNOSIS — Z5181 Encounter for therapeutic drug level monitoring: Secondary | ICD-10-CM

## 2019-02-14 DIAGNOSIS — Z950 Presence of cardiac pacemaker: Secondary | ICD-10-CM

## 2019-02-14 LAB — COMPREHENSIVE METABOLIC PANEL
ALT: 15 IU/L (ref 0–32)
AST: 20 IU/L (ref 0–40)
Albumin/Globulin Ratio: 2.3 — ABNORMAL HIGH (ref 1.2–2.2)
Albumin: 4.3 g/dL (ref 3.5–4.6)
Alkaline Phosphatase: 57 IU/L (ref 39–117)
BUN/Creatinine Ratio: 22 (ref 12–28)
BUN: 38 mg/dL — ABNORMAL HIGH (ref 10–36)
Bilirubin Total: 0.8 mg/dL (ref 0.0–1.2)
CO2: 27 mmol/L (ref 20–29)
Calcium: 9.8 mg/dL (ref 8.7–10.3)
Chloride: 99 mmol/L (ref 96–106)
Creatinine, Ser: 1.7 mg/dL — ABNORMAL HIGH (ref 0.57–1.00)
GFR calc Af Amer: 30 mL/min/{1.73_m2} — ABNORMAL LOW (ref >59)
GFR calc non Af Amer: 26 mL/min/{1.73_m2} — ABNORMAL LOW (ref >59)
Globulin, Total: 1.9 g/dL (ref 1.5–4.5)
Glucose: 104 mg/dL — ABNORMAL HIGH (ref 65–99)
Potassium: 4.3 mmol/L (ref 3.5–5.2)
Sodium: 143 mmol/L (ref 134–144)
Total Protein: 6.2 g/dL (ref 6.0–8.5)

## 2019-02-14 LAB — CBC
Hematocrit: 32.1 % — ABNORMAL LOW (ref 34.0–46.6)
Hemoglobin: 10.9 g/dL — ABNORMAL LOW (ref 11.1–15.9)
MCH: 31.8 pg (ref 26.6–33.0)
MCHC: 34 g/dL (ref 31.5–35.7)
MCV: 94 fL (ref 79–97)
Platelets: 165 10*3/uL (ref 150–450)
RBC: 3.43 x10E6/uL — ABNORMAL LOW (ref 3.77–5.28)
RDW: 12.6 % (ref 11.7–15.4)
WBC: 9.2 10*3/uL (ref 3.4–10.8)

## 2019-02-14 LAB — TSH: TSH: 9.62 u[IU]/mL — ABNORMAL HIGH (ref 0.450–4.500)

## 2019-02-14 LAB — POCT INR: INR: 2.5 (ref 2.0–3.0)

## 2019-02-14 NOTE — Patient Instructions (Signed)
Description   Continue on same dosage 2mg  daily.  Recheck INR in 5 weeks.  Call our office if you have any concerns [phone]. Please call if you decide to do dental extractions.

## 2019-02-16 ENCOUNTER — Telehealth: Payer: Self-pay

## 2019-02-16 DIAGNOSIS — E039 Hypothyroidism, unspecified: Secondary | ICD-10-CM

## 2019-02-16 MED ORDER — LEVOTHYROXINE SODIUM 25 MCG PO TABS: 25.0000 ug | ORAL_TABLET | Freq: Every day | ORAL | 3 refills | Status: DC

## 2019-02-16 NOTE — Telephone Encounter (Signed)
Spoke with pt's daughter and she has verbalized understanding of Dr Olin Pia recommendations. Pt will begin 40mcg qAM and have repeat TSH drawn in 4 weeks.  She has no additional questions.

## 2019-02-16 NOTE — Telephone Encounter (Signed)
-----   Message from Deboraha Sprang, MD sent at 02/15/2019  5:58 PM EDT ----- Please inform patient that drug surveillance labs show normal liver function, borderline abnormal kidney function, her anemia is stable but her TSH is continuing to go up,  Lets begin her please on synthroid 25 mcg daily and repeat in 4 weeks

## 2019-02-26 ENCOUNTER — Other Ambulatory Visit: Payer: Self-pay | Admitting: Cardiology

## 2019-03-01 ENCOUNTER — Other Ambulatory Visit: Payer: Self-pay | Admitting: Cardiology

## 2019-03-21 ENCOUNTER — Ambulatory Visit (INDEPENDENT_AMBULATORY_CARE_PROVIDER_SITE_OTHER): Payer: Medicare Other | Admitting: Cardiology

## 2019-03-21 ENCOUNTER — Other Ambulatory Visit: Payer: Medicare Other

## 2019-03-21 DIAGNOSIS — I4891 Unspecified atrial fibrillation: Secondary | ICD-10-CM

## 2019-03-21 DIAGNOSIS — Z5181 Encounter for therapeutic drug level monitoring: Secondary | ICD-10-CM

## 2019-03-21 LAB — POCT INR: INR: 2.2 (ref 2.0–3.0)

## 2019-03-21 NOTE — Patient Instructions (Signed)
Description   Spoke with Chrys Racer, RN with [facility] and advised pt to continue on same dosage 2mg  daily.  Recheck INR in 6 weeks.  Call our office if you have any concerns [phone]. Please call if you decide to do dental extractions.

## 2019-03-30 ENCOUNTER — Telehealth: Payer: Self-pay | Admitting: *Deleted

## 2019-03-30 ENCOUNTER — Telehealth: Payer: Self-pay | Admitting: Cardiology

## 2019-03-30 NOTE — Telephone Encounter (Signed)
Left message to call back to change to virtual per Dr Percival Spanish

## 2019-03-30 NOTE — Telephone Encounter (Signed)
I called pt to confirm her appt on 03-31-19 with Dr Percival Spanish.        COVID-19 Pre-Screening Questions:   In the past 7 to 10 days have you had a cough,  shortness of breath, headache, congestion, fever (100 or greater) body aches, chills, sore throat, or sudden loss of taste or sense of smell? no  Have you been around anyone with known Covid 19.  Have you been around anyone who is awaiting Covid 19 test results in the past 7 to 10 days? no  Have you been around anyone who has been exposed to Covid 19, or has mentioned symptoms of Covid 19 within the past 7 to 10 days? no  If you have any concerns/questions about symptoms patients report during screening (either on the phone or at threshold). Contact the provider seeing the patient or DOD for further guidance.  If neither are available contact a member of the leadership team.

## 2019-03-30 NOTE — Telephone Encounter (Signed)
Patient called back and wanted to remain in office visit

## 2019-03-30 NOTE — Progress Notes (Signed)
HPI The patient presents after followup of had tachybradycardia syndrome.  She was in the hospital earlier this year with acute on chronic systolic and diastolic HF.  She was diuresed.    Since then she has done well. The patient denies any new symptoms such as chest discomfort, neck or arm discomfort. There has been no new shortness of breath, PND or orthopnea. There have been no reported palpitations, presyncope or syncope.  She did have an elevated TSH and has had Synthroid adjusted.    Not on File  Current Outpatient Medications  Medication Sig Dispense Refill  . acetaminophen (TYLENOL) 500 MG tablet Take 500 mg by mouth every 6 (six) hours as needed for headache (pain).    Marland Kitchen amiodarone (PACERONE) 200 MG tablet Take 1 tablet (200 mg total) by mouth daily. 30 tablet 3  . atorvastatin (LIPITOR) 10 MG tablet TAKE 1/2 TABLET BY MOUTH EVERY NIGHT AT BEDTIME (Patient taking differently: Take 5 mg by mouth at bedtime. ) 45 tablet 3  . brimonidine (ALPHAGAN) 0.2 % ophthalmic solution Place 1 drop into both eyes daily.    . furosemide (LASIX) 80 MG tablet TAKE 1 TABLET(80 MG) BY MOUTH DAILY 90 tablet 1  . levothyroxine (SYNTHROID) 25 MCG tablet Take 1 tablet (25 mcg total) by mouth daily before breakfast. 90 tablet 3  . lisinopril (ZESTRIL) 5 MG tablet TAKE 1 TABLET(5 MG) BY MOUTH DAILY 90 tablet 0  . metoprolol tartrate (LOPRESSOR) 50 MG tablet TAKE 1 AND 1/2 TABLETS(75 MG) BY MOUTH TWICE DAILY (Patient taking differently: Take 75 mg by mouth 2 (two) times daily. ) 90 tablet 10  . Multiple Vitamin (MULTIVITAMIN WITH MINERALS) TABS tablet Take 1 tablet by mouth daily.    . Olopatadine HCl (PATADAY) 0.2 % SOLN Place 1 drop into both eyes daily.     . potassium chloride SA (K-DUR,KLOR-CON) 20 MEQ tablet TAKE 1 TABLET BY MOUTH EVERY DAY (Patient taking differently: Take 20 mEq by mouth daily. ) 90 tablet 1  . senna (SENOKOT) 8.6 MG TABS tablet Take 1 tablet by mouth at bedtime as needed for  mild constipation.    . Travoprost, BAK Free, (TRAVATAN) 0.004 % SOLN ophthalmic solution Place 1 drop into both eyes at bedtime.    Marland Kitchen warfarin (COUMADIN) 2 MG tablet TAKE 1 TABLET BY MOUTH DAILY OR AS DIRECTED BY COUMADIN CLINIC 90 tablet 1   No current facility-administered medications for this visit.     Past Medical History:  Diagnosis Date  . Anemia   . Arthritis    "knees especially"  . Bradycardia   . Cardiomyopathy    presumed ischemic in the past with an EF of 35%. The most recent EF in 03-2008, however, was 55 -60%  . Chronic renal insufficiency   . Dementia ([place])    mild  . Dyslipidemia   . Glaucoma   . Hypertension   . Malleolar fracture   . Paroxysmal atrial fibrillation [facility])     Past Surgical History:  Procedure Laterality Date  . ABDOMINAL HYSTERECTOMY    . CARDIOVERSION  07/2011  . EYE SURGERY  2002   "blood clot behind eye"  . FRACTURE SURGERY  ~ 2007   RLE  . HEMORRHOID SURGERY    . INSERT / REPLACE / REMOVE PACEMAKER  09/17/11   initial placement  . INSERT / REPLACE / REMOVE PACEMAKER  09/18/11  . PACEMAKER REVISION N/A 09/18/2011   Procedure: PACEMAKER REVISION;  Surgeon: Jeneen Rinks  Allred, MD;  Location: [facility] CATH LAB;  Service: Cardiovascular;  Laterality: N/A;  . PERMANENT PACEMAKER INSERTION N/A 09/17/2011   Procedure: PERMANENT PACEMAKER INSERTION;  Surgeon: Deboraha Sprang, MD;  Location: [facility] CATH LAB;  Service: Cardiovascular;  Laterality: N/A;  . TONSILLECTOMY AND ADENOIDECTOMY      ROS:    As stated in the HPI and negative for all other systems.   PHYSICAL EXAM BP 108/76   GEN:  No distress NECK:  No jugular venous distention at 90 degrees, waveform within normal limits, carotid upstroke brisk and symmetric, no bruits, no thyromegaly LYMPHATICS:  No cervical adenopathy LUNGS:  Clear to auscultation bilaterally BACK:  No CVA tenderness CHEST:  Unremarkable HEART:  S1 and S2 within normal limits, no S3, no S4, no clicks, no rubs, no murmurs ABD:   Positive bowel sounds normal in frequency in pitch, no bruits, no rebound, no guarding, unable to assess midline mass or bruit with the patient seated. EXT:  2 plus pulses throughout, moderate edema, no cyanosis no clubbing SKIN:  No rashes no nodules NEURO:  Cranial nerves II through XII grossly intact, motor grossly intact throughout PSYCH:  Cognitively intact, oriented to person place and time   Lab Results  Component Value Date   TSH 9.620 (H) 02/14/2019   ALT 15 02/14/2019   AST 20 02/14/2019   ALKPHOS 57 02/14/2019   BILITOT 0.8 02/14/2019   PROT 6.2 02/14/2019   ALBUMIN 4.3 02/14/2019   EKG:  NA  ASSESSMENT AND PLAN   CARDIOMYOPATHY -  She seems to be euvolemic.  No change in therapy.   HYPERTENSION -  The blood pressure is at target.  No change in therapy.   Tachycardia-bradycardia syndrome -  She is up to date with device follow up.  She is up-to-date with follow-up.  Her TSH was slightly elevated and her Synthroid adjusted.  I am to check a TSH and T4 today.  She was getting her Coumadin checked by home health and I am to see if they can continue to do this.

## 2019-03-31 ENCOUNTER — Encounter: Payer: Self-pay | Admitting: Cardiology

## 2019-03-31 ENCOUNTER — Other Ambulatory Visit: Payer: Self-pay

## 2019-03-31 ENCOUNTER — Ambulatory Visit (INDEPENDENT_AMBULATORY_CARE_PROVIDER_SITE_OTHER): Payer: Medicare Other | Admitting: Cardiology

## 2019-03-31 VITALS — BP 108/76

## 2019-03-31 DIAGNOSIS — Z5181 Encounter for therapeutic drug level monitoring: Secondary | ICD-10-CM | POA: Diagnosis not present

## 2019-03-31 DIAGNOSIS — E039 Hypothyroidism, unspecified: Secondary | ICD-10-CM

## 2019-03-31 NOTE — Patient Instructions (Signed)
Medication Instructions:  Your physician recommends that you continue on your current medications as directed. Please refer to the Current Medication list given to you today.  If you need a refill on your cardiac medications before your next appointment, please call your pharmacy.   Lab work: TSH/FT4 TODAY   If you have labs (blood work) drawn today and your tests are completely normal, you will receive your results only by: Marland Kitchen MyChart Message (if you have MyChart) OR . A paper copy in the mail If you have any lab test that is abnormal or we need to change your treatment, we will call you to review the results.  Testing/Procedures: NONE  Follow-Up: At [facility], you and your health needs are our priority.  As part of our continuing mission to provide you with exceptional heart care, we have created designated Provider Care Teams.  These Care Teams include your primary Cardiologist (physician) and Advanced Practice Providers (APPs -  Physician Assistants and Nurse Practitioners) who all work together to provide you with the care you need, when you need it. You will need a follow up appointment in 3 months.  Please call our office 2 months in advance to schedule this appointment.  You may see Minus Breeding, MD or one of the following Advanced Practice Providers on your designated Care Team:   Rosaria Ferries, PA-C . Jory Sims, DNP, ANP  Any Other Special Instructions Will Be Listed Below (If Applicable).

## 2019-04-01 LAB — TSH: TSH: 6.23 u[IU]/mL — ABNORMAL HIGH (ref 0.450–4.500)

## 2019-04-01 LAB — T4, FREE: Free T4: 1.61 ng/dL (ref 0.82–1.77)

## 2019-04-24 ENCOUNTER — Other Ambulatory Visit: Payer: Self-pay | Admitting: Cardiology

## 2019-04-26 ENCOUNTER — Encounter: Payer: Medicare Other | Admitting: *Deleted

## 2019-04-27 ENCOUNTER — Telehealth: Payer: Self-pay | Admitting: Cardiology

## 2019-04-27 NOTE — Telephone Encounter (Signed)
Spoke to pt daughter, she is no longer w/ the patient. Explained to her what she should try to do to reboot the monitor. Also gave her the number to [place]. Pt daughter states that she will go back as soon as she can but may not be until Sunday.

## 2019-04-27 NOTE — Telephone Encounter (Signed)
New message:    Patient daughter calling stating that they got a code 3230 on Device machine. Please call patient.

## 2019-04-27 NOTE — Telephone Encounter (Signed)
Follow up   Patient's daughter is has questions per the previous message. Please call.

## 2019-04-27 NOTE — Telephone Encounter (Signed)
Called patient daughter, she states she is getting a error message on the device- #3230  She states they have tried everything since yesterday and have been unable to do anything.  I advised I would route to Device Clinic to assist. Daughter appreciative for call back.

## 2019-04-28 NOTE — Telephone Encounter (Signed)
Follow Up    Patient's daughter states that [facility] will be sending a new monitor and it will take 5-10 days to arrive. Patient just wanted to let the office know.

## 2019-05-03 ENCOUNTER — Ambulatory Visit (INDEPENDENT_AMBULATORY_CARE_PROVIDER_SITE_OTHER): Payer: Medicare Other

## 2019-05-03 DIAGNOSIS — Z5181 Encounter for therapeutic drug level monitoring: Secondary | ICD-10-CM | POA: Diagnosis not present

## 2019-05-03 DIAGNOSIS — I4891 Unspecified atrial fibrillation: Secondary | ICD-10-CM

## 2019-05-03 LAB — POCT INR: INR: 3 (ref 2.0–3.0)

## 2019-05-03 NOTE — Patient Instructions (Signed)
Description   Spoke with Chrys Racer, RN with [facility] and advised pt to take 1mg  today, then resume  same dosage 2mg  daily.  Recheck INR in 6 weeks.  [facility] will be discharging pt prior to 6 week recheck, will advise pt's daughter [facility] clinic for appt in 6 weeks. Call our office if you have any concerns [phone]. Please call if you decide to do dental extractions.

## 2019-05-04 ENCOUNTER — Encounter: Payer: Self-pay | Admitting: Cardiology

## 2019-05-05 ENCOUNTER — Ambulatory Visit (INDEPENDENT_AMBULATORY_CARE_PROVIDER_SITE_OTHER): Payer: Medicare Other | Admitting: *Deleted

## 2019-05-05 DIAGNOSIS — I495 Sick sinus syndrome: Secondary | ICD-10-CM | POA: Diagnosis not present

## 2019-05-08 ENCOUNTER — Ambulatory Visit: Payer: Medicare Other | Admitting: *Deleted

## 2019-05-08 NOTE — Telephone Encounter (Signed)
Transmission received 05-07-2019

## 2019-05-09 LAB — CUP PACEART REMOTE DEVICE CHECK
Battery Impedance: 685 Ohm
Battery Remaining Longevity: 73 mo
Battery Voltage: 2.77 V
Brady Statistic AP VP Percent: 1 %
Brady Statistic AP VS Percent: 98 %
Brady Statistic AS VP Percent: 0 %
Brady Statistic AS VS Percent: 1 %
Date Time Interrogation Session: 20200823175233
Implantable Lead Implant Date: 20130103
Implantable Lead Implant Date: 20130103
Implantable Lead Location: 753859
Implantable Lead Location: 753860
Implantable Lead Model: 1944
Implantable Lead Model: 1948
Implantable Pulse Generator Implant Date: 20130103
Lead Channel Impedance Value: 391 Ohm
Lead Channel Impedance Value: 699 Ohm
Lead Channel Pacing Threshold Amplitude: 0.375 V
Lead Channel Pacing Threshold Amplitude: 0.625 V
Lead Channel Pacing Threshold Pulse Width: 0.4 ms
Lead Channel Pacing Threshold Pulse Width: 0.4 ms
Lead Channel Setting Pacing Amplitude: 2 V
Lead Channel Setting Pacing Amplitude: 2.5 V
Lead Channel Setting Pacing Pulse Width: 0.4 ms
Lead Channel Setting Sensing Sensitivity: 5.6 mV

## 2019-05-12 ENCOUNTER — Encounter: Payer: Self-pay | Admitting: Cardiology

## 2019-05-12 NOTE — Progress Notes (Signed)
Remote pacemaker transmission.   

## 2019-05-31 ENCOUNTER — Other Ambulatory Visit: Payer: Self-pay

## 2019-05-31 MED ORDER — LISINOPRIL 5 MG PO TABS: ORAL_TABLET | ORAL | 3 refills | Status: DC

## 2019-06-06 ENCOUNTER — Other Ambulatory Visit: Payer: Self-pay | Admitting: Cardiology

## 2019-06-06 MED ORDER — AMIODARONE HCL 200 MG PO TABS: 200.0000 mg | ORAL_TABLET | Freq: Every day | ORAL | 3 refills | Status: DC

## 2019-06-14 ENCOUNTER — Ambulatory Visit (INDEPENDENT_AMBULATORY_CARE_PROVIDER_SITE_OTHER): Payer: Medicare Other

## 2019-06-14 ENCOUNTER — Other Ambulatory Visit: Payer: Self-pay | Admitting: Internal Medicine

## 2019-06-14 ENCOUNTER — Telehealth: Payer: Self-pay

## 2019-06-14 ENCOUNTER — Other Ambulatory Visit: Payer: Self-pay

## 2019-06-14 DIAGNOSIS — I4891 Unspecified atrial fibrillation: Secondary | ICD-10-CM | POA: Diagnosis not present

## 2019-06-14 DIAGNOSIS — Z5181 Encounter for therapeutic drug level monitoring: Secondary | ICD-10-CM | POA: Diagnosis not present

## 2019-06-14 LAB — POCT INR: INR: 3 (ref 2.0–3.0)

## 2019-06-14 NOTE — Telephone Encounter (Signed)
The dose is 200 mg PO 5 days per week.

## 2019-06-14 NOTE — Telephone Encounter (Signed)
Pt and her daughter were seen today in the Coumadin Clinic.  Pt's daughter states when she picked up Amiodarone rx last week and rx states take 1/2 tablet x 5 days, and skip 2 days.  Last rx refill in Epic for Amiodarone states it was sent in on 06/06/19 by Dr Percival Spanish for 200mg  QD.  Unsure of why discrepency.  Daughter states she called the pharmacy to question the instructions and they stated "Santiago Glad" told them the instructions.  Cannot find any documentation of this in Epic could you please look into this and advise pt's daughter of the dose of Amiodarone pt should be taking, Thanks. Pharmacy is [facility] in [place].

## 2019-06-14 NOTE — Telephone Encounter (Signed)
Call returned to the patient and the patient's daughter. She stated that when they went to pick up the Amiodarone a few weeks ago that the prescription stated to take Amiodarone 1/2 a tablet 5 days a week, skipping two days.   They noticed that in [place] (and in [place]), Amiodarone directions state to take 200 mg once daily.    For the past week, the patient has been taking the 1/2 tablet 5 days a week. They would like clarification as to what the correct dosage should be.

## 2019-06-14 NOTE — Patient Instructions (Signed)
Description   Take 1mg  today, then resume  same dosage 2mg  daily.  Recheck INR in 6 weeks. Call our office if you have any concerns [phone]. Please call if you decide to do dental extractions.

## 2019-06-15 MED ORDER — AMIODARONE HCL 200 MG PO TABS: ORAL_TABLET | ORAL | 3 refills | Status: DC

## 2019-06-15 NOTE — Telephone Encounter (Signed)
Advised daughter, verbalized understanding  New Rx sent to [facility]

## 2019-06-16 NOTE — Telephone Encounter (Signed)
This is Dr. Hochrein's pt. °

## 2019-06-18 ENCOUNTER — Other Ambulatory Visit: Payer: Self-pay | Admitting: Cardiology

## 2019-06-19 ENCOUNTER — Other Ambulatory Visit: Payer: Self-pay | Admitting: Internal Medicine

## 2019-06-28 NOTE — Progress Notes (Signed)
HPI The patient presents after followup of had tachybradycardia syndrome.  She was in the hospital earlier this year with acute on chronic systolic and diastolic HF.  She was diuresed.    Since then she has done well.  The patient denies any new symptoms such as chest discomfort, neck or arm discomfort. There has been no new shortness of breath, PND or orthopnea. There have been no reported palpitations, presyncope or syncope.      Not on File  Current Outpatient Medications  Medication Sig Dispense Refill  . acetaminophen (TYLENOL) 500 MG tablet Take 500 mg by mouth every 6 (six) hours as needed for headache (pain).    Marland Kitchen amiodarone (PACERONE) 200 MG tablet Take 1 tablet by mouth 5 days a week 90 tablet 3  . atorvastatin (LIPITOR) 10 MG tablet TAKE 1/2 TABLET BY MOUTH EVERY NIGHT AT BEDTIME (Patient taking differently: Take 5 mg by mouth at bedtime. ) 45 tablet 3  . brimonidine (ALPHAGAN) 0.2 % ophthalmic solution Place 1 drop into both eyes daily.    . furosemide (LASIX) 80 MG tablet TAKE 1 TABLET(80 MG) BY MOUTH DAILY 90 tablet 1  . levothyroxine (SYNTHROID) 25 MCG tablet Take 1 tablet (25 mcg total) by mouth daily before breakfast. 90 tablet 3  . lisinopril (ZESTRIL) 5 MG tablet TAKE 1 TABLET(5 MG) BY MOUTH DAILY 90 tablet 3  . metoprolol tartrate (LOPRESSOR) 50 MG tablet TAKE 1 AND 1/2 TABLETS BY MOUTH TWICE DAILY 270 tablet 2  . Multiple Vitamin (MULTIVITAMIN WITH MINERALS) TABS tablet Take 1 tablet by mouth daily.    . Olopatadine HCl (PATADAY) 0.2 % SOLN Place 1 drop into both eyes daily.     . potassium chloride SA (KLOR-CON) 20 MEQ tablet TAKE 1 TABLET BY MOUTH EVERY DAY 90 tablet 1  . senna (SENOKOT) 8.6 MG TABS tablet Take 1 tablet by mouth at bedtime as needed for mild constipation.    . Travoprost, BAK Free, (TRAVATAN) 0.004 % SOLN ophthalmic solution Place 1 drop into both eyes at bedtime.    Marland Kitchen warfarin (COUMADIN) 2 MG tablet TAKE 1 TABLET BY MOUTH DAILY OR AS DIRECTED  BY COUMADIN CLINIC 90 tablet 1   No current facility-administered medications for this visit.     Past Medical History:  Diagnosis Date  . Anemia   . Arthritis    "knees especially"  . Bradycardia   . Cardiomyopathy    presumed ischemic in the past with an EF of 35%. The most recent EF in 03-2008, however, was 55 -60%  . Chronic renal insufficiency   . Dementia ([place])    mild  . Dyslipidemia   . Glaucoma   . Hypertension   . Malleolar fracture   . Paroxysmal atrial fibrillation [facility])     Past Surgical History:  Procedure Laterality Date  . ABDOMINAL HYSTERECTOMY    . CARDIOVERSION  07/2011  . EYE SURGERY  2002   "blood clot behind eye"  . FRACTURE SURGERY  ~ 2007   RLE  . HEMORRHOID SURGERY    . INSERT / REPLACE / REMOVE PACEMAKER  09/17/11   initial placement  . INSERT / REPLACE / REMOVE PACEMAKER  09/18/11  . PACEMAKER REVISION N/A 09/18/2011   Procedure: PACEMAKER REVISION;  Surgeon: Thompson Grayer, MD;  Location[facility] CATH LAB;  Service: Cardiovascular;  Laterality: N/A;  . PERMANENT PACEMAKER INSERTION N/A 09/17/2011   Procedure: PERMANENT PACEMAKER INSERTION;  Surgeon: Deboraha Sprang, MD;  Location: [place] CATH LAB;  Service: Cardiovascular;  Laterality: N/A;  . TONSILLECTOMY AND ADENOIDECTOMY      ROS:   As stated in the HPI and negative for all other systems.   PHYSICAL EXAM BP 113/75   Pulse 63   Temp (!) 96.7 F (35.9 C)   Ht 5' (1.524 m)   Wt 122 lb 12.8 oz (55.7 kg)   SpO2 98%   BMI 23.98 kg/m   GEN:  No distress NECK:  No jugular venous distention at 90 degrees, waveform within normal limits, carotid upstroke brisk and symmetric, no bruits, no thyromegaly LYMPHATICS:  No cervical adenopathy LUNGS:  Clear to auscultation bilaterally BACK:  No CVA tenderness CHEST:  Well healed device pocket. HEART:  S1 and S2 within normal limits, no S3, no S4, no clicks, no rubs, 2 out of 6 apical systolic murmur nonradiating, no diastolic murmurs ABD:  Positive bowel sounds  normal in frequency in pitch, no bruits, no rebound, no guarding, unable to assess midline mass or bruit with the patient seated. EXT:  2 plus pulses throughout, no edema, no cyanosis no clubbing SKIN:  No rashes no nodules NEURO:  Cranial nerves II through XII grossly intact, motor grossly intact throughout PSYCH:  Cognitively intact, oriented to person place and time    Lab Results  Component Value Date   TSH 6.230 (H) 03/31/2019   ALT 15 02/14/2019   AST 20 02/14/2019   ALKPHOS 57 02/14/2019   BILITOT 0.8 02/14/2019   PROT 6.2 02/14/2019   ALBUMIN 4.3 02/14/2019    EKG: Atrial paced rhythm , rate 63, axis within normal limits, intervals within normal limits, no acute ST-T wave changes.  Left bundle branch block   ASSESSMENT AND PLAN   CARDIOMYOPATHY -  He seems to be euvolemic.  No change in therapy.  She seems to be euvolemic.  No change in therapy.   HYPERTENSION -  The blood pressure is at target.  No change in therapy.    Tachycardia-bradycardia syndrome -  She is up to date with device follow up.  She had 0.6% atrial fib.  No change in therapy.

## 2019-06-30 ENCOUNTER — Encounter: Payer: Self-pay | Admitting: Cardiology

## 2019-06-30 ENCOUNTER — Ambulatory Visit (INDEPENDENT_AMBULATORY_CARE_PROVIDER_SITE_OTHER): Payer: Medicare Other | Admitting: Cardiology

## 2019-06-30 ENCOUNTER — Other Ambulatory Visit: Payer: Self-pay

## 2019-06-30 VITALS — BP 113/75 | HR 63 | Temp 96.7°F | Ht 60.0 in | Wt 122.8 lb

## 2019-06-30 DIAGNOSIS — Z23 Encounter for immunization: Secondary | ICD-10-CM | POA: Diagnosis not present

## 2019-06-30 DIAGNOSIS — I420 Dilated cardiomyopathy: Secondary | ICD-10-CM | POA: Diagnosis not present

## 2019-06-30 DIAGNOSIS — I4891 Unspecified atrial fibrillation: Secondary | ICD-10-CM | POA: Diagnosis not present

## 2019-06-30 DIAGNOSIS — Z79899 Other long term (current) drug therapy: Secondary | ICD-10-CM

## 2019-06-30 DIAGNOSIS — I10 Essential (primary) hypertension: Secondary | ICD-10-CM

## 2019-06-30 LAB — CBC
Hematocrit: 33.4 % — ABNORMAL LOW (ref 34.0–46.6)
Hemoglobin: 10.7 g/dL — ABNORMAL LOW (ref 11.1–15.9)
MCH: 31.9 pg (ref 26.6–33.0)
MCHC: 32 g/dL (ref 31.5–35.7)
MCV: 100 fL — ABNORMAL HIGH (ref 79–97)
Platelets: 175 10*3/uL (ref 150–450)
RBC: 3.35 x10E6/uL — ABNORMAL LOW (ref 3.77–5.28)
RDW: 12.9 % (ref 11.7–15.4)
WBC: 8.6 10*3/uL (ref 3.4–10.8)

## 2019-06-30 LAB — BASIC METABOLIC PANEL
BUN/Creatinine Ratio: 23 (ref 12–28)
BUN: 36 mg/dL (ref 10–36)
CO2: 27 mmol/L (ref 20–29)
Calcium: 9.4 mg/dL (ref 8.7–10.3)
Chloride: 99 mmol/L (ref 96–106)
Creatinine, Ser: 1.55 mg/dL — ABNORMAL HIGH (ref 0.57–1.00)
GFR calc Af Amer: 33 mL/min/{1.73_m2} — ABNORMAL LOW (ref >59)
GFR calc non Af Amer: 29 mL/min/{1.73_m2} — ABNORMAL LOW (ref >59)
Glucose: 97 mg/dL (ref 65–99)
Potassium: 4.3 mmol/L (ref 3.5–5.2)
Sodium: 141 mmol/L (ref 134–144)

## 2019-06-30 NOTE — Patient Instructions (Signed)
Medication Instructions:  The current medical regimen is effective;  continue present plan and medications as directed. Please refer to the Current Medication list given to you today. If you need a refill on your cardiac medications before your next appointment, please call your pharmacy.  Labwork: BMET AND CBC HERE IN OUR OFFICE AT [facility]   If you have labs (blood work) drawn today and your tests are completely normal, you will receive your results only by: Marland Kitchen MyChart Message (if you have MyChart) OR . A paper copy in the mail If you have any lab test that is abnormal or we need to change your treatment, we will call you to review the results.  Follow-Up: At [facility], you and your health needs are our priority.  As part of our continuing mission to provide you with exceptional heart care, we have created designated Provider Care Teams.  These Care Teams include your primary Cardiologist (physician) and Advanced Practice Providers (APPs -  Physician Assistants and Nurse Practitioners) who all work together to provide you with the care you need, when you need it.  Your next appointment:   3 months.   The format for your next appointment:   In Person  Provider:   You may see Minus Breeding, MD or one of the following Advanced Practice Providers on your designated Care Team:    Rosaria Ferries, PA-C  Jory Sims, DNP, ANP  Cadence Kathlen Mody, NP    Thank you for choosing [facility] at [facility]!!

## 2019-07-24 ENCOUNTER — Telehealth: Payer: Self-pay | Admitting: Cardiology

## 2019-07-24 ENCOUNTER — Other Ambulatory Visit: Payer: Self-pay

## 2019-07-24 MED ORDER — WARFARIN SODIUM 2 MG PO TABS: ORAL_TABLET | ORAL | 1 refills | Status: DC

## 2019-07-24 NOTE — Telephone Encounter (Signed)
Assessment completed by PCP after fall. No bleeding noted, just bruised on the side of the face.  Recommendation:  Okay to wait until bruised resolved by itself. May use Arnica cream to help with bruises.

## 2019-07-24 NOTE — Telephone Encounter (Signed)
New Message    Pts daughter is calling and says she would like to speak with the coumadin Clinic. Pts daughter said she had fallen Thursday night and hit her face.  She said she was okay. But the next day it was busied on her face She is wondering if her Coumadin medication could effect the bruising    Please call

## 2019-07-24 NOTE — Telephone Encounter (Signed)
Will route to [facility] 

## 2019-07-25 ENCOUNTER — Other Ambulatory Visit: Payer: Self-pay

## 2019-07-25 MED ORDER — FUROSEMIDE 80 MG PO TABS: ORAL_TABLET | ORAL | 2 refills | Status: DC

## 2019-07-26 ENCOUNTER — Other Ambulatory Visit: Payer: Self-pay

## 2019-07-26 ENCOUNTER — Ambulatory Visit (INDEPENDENT_AMBULATORY_CARE_PROVIDER_SITE_OTHER): Payer: Medicare Other | Admitting: *Deleted

## 2019-07-26 DIAGNOSIS — I4891 Unspecified atrial fibrillation: Secondary | ICD-10-CM | POA: Diagnosis not present

## 2019-07-26 DIAGNOSIS — Z5181 Encounter for therapeutic drug level monitoring: Secondary | ICD-10-CM | POA: Diagnosis not present

## 2019-07-26 LAB — POCT INR: INR: 4.3 — AB (ref 2.0–3.0)

## 2019-07-26 NOTE — Patient Instructions (Signed)
Description   Do not take any Coumadin today and No Coumadin tomorrow then continue taking 2mg  daily.  Recheck INR in 2 weeks. Call our office if you have any concerns [phone]. Please call if you decide to do dental extractions.

## 2019-08-04 ENCOUNTER — Ambulatory Visit (INDEPENDENT_AMBULATORY_CARE_PROVIDER_SITE_OTHER): Payer: Medicare Other | Admitting: *Deleted

## 2019-08-04 DIAGNOSIS — I495 Sick sinus syndrome: Secondary | ICD-10-CM

## 2019-08-07 ENCOUNTER — Ambulatory Visit (INDEPENDENT_AMBULATORY_CARE_PROVIDER_SITE_OTHER): Payer: Medicare Other | Admitting: *Deleted

## 2019-08-07 DIAGNOSIS — Z950 Presence of cardiac pacemaker: Secondary | ICD-10-CM | POA: Diagnosis not present

## 2019-08-07 LAB — CUP PACEART REMOTE DEVICE CHECK
Battery Impedance: 785 Ohm
Battery Remaining Longevity: 68 mo
Battery Voltage: 2.77 V
Brady Statistic AP VP Percent: 1 %
Brady Statistic AP VS Percent: 98 %
Brady Statistic AS VP Percent: 0 %
Brady Statistic AS VS Percent: 1 %
Date Time Interrogation Session: 20201123075322
Implantable Lead Implant Date: 20130103
Implantable Lead Implant Date: 20130103
Implantable Lead Location: 753859
Implantable Lead Location: 753860
Implantable Lead Model: 1944
Implantable Lead Model: 1948
Implantable Pulse Generator Implant Date: 20130103
Lead Channel Impedance Value: 390 Ohm
Lead Channel Impedance Value: 644 Ohm
Lead Channel Pacing Threshold Amplitude: 0.375 V
Lead Channel Pacing Threshold Amplitude: 0.625 V
Lead Channel Pacing Threshold Pulse Width: 0.4 ms
Lead Channel Pacing Threshold Pulse Width: 0.4 ms
Lead Channel Setting Pacing Amplitude: 2 V
Lead Channel Setting Pacing Amplitude: 2.5 V
Lead Channel Setting Pacing Pulse Width: 0.4 ms
Lead Channel Setting Sensing Sensitivity: 5.6 mV

## 2019-08-08 ENCOUNTER — Other Ambulatory Visit: Payer: Self-pay

## 2019-08-08 ENCOUNTER — Ambulatory Visit (INDEPENDENT_AMBULATORY_CARE_PROVIDER_SITE_OTHER): Payer: Medicare Other | Admitting: *Deleted

## 2019-08-08 DIAGNOSIS — I4891 Unspecified atrial fibrillation: Secondary | ICD-10-CM

## 2019-08-08 DIAGNOSIS — Z5181 Encounter for therapeutic drug level monitoring: Secondary | ICD-10-CM | POA: Diagnosis not present

## 2019-08-08 LAB — POCT INR: INR: 3.4 — AB (ref 2.0–3.0)

## 2019-08-08 NOTE — Patient Instructions (Signed)
Description   Do not take any Coumadin today then start taking taking 2mg  daily except 1mg  on Sundays.  Recheck INR in 2 weeks. Call our office if you have any concerns [phone]. Please call if you decide to do dental extractions.

## 2019-08-23 ENCOUNTER — Other Ambulatory Visit: Payer: Self-pay

## 2019-08-23 ENCOUNTER — Ambulatory Visit (INDEPENDENT_AMBULATORY_CARE_PROVIDER_SITE_OTHER): Payer: Medicare Other

## 2019-08-23 DIAGNOSIS — I4891 Unspecified atrial fibrillation: Secondary | ICD-10-CM | POA: Diagnosis not present

## 2019-08-23 DIAGNOSIS — Z5181 Encounter for therapeutic drug level monitoring: Secondary | ICD-10-CM

## 2019-08-23 LAB — POCT INR: INR: 2.8 (ref 2.0–3.0)

## 2019-08-23 NOTE — Patient Instructions (Signed)
Description   Continue on same dosage 2mg  daily except 1mg  on Sundays.  Recheck INR in 3 weeks. Call our office if you have any concerns [phone]. Please call if you decide to do dental extractions.

## 2019-08-26 ENCOUNTER — Encounter: Payer: Self-pay | Admitting: Cardiology

## 2019-08-26 NOTE — Progress Notes (Signed)
Remote pacemaker transmission.   

## 2019-08-28 ENCOUNTER — Other Ambulatory Visit: Payer: Self-pay

## 2019-09-13 ENCOUNTER — Other Ambulatory Visit: Payer: Self-pay | Admitting: Cardiology

## 2019-09-13 LAB — PROTIME-INR: INR: 2.1 — AB (ref 0.9–1.1)

## 2019-09-13 NOTE — Telephone Encounter (Signed)
Rx(s) sent to pharmacy electronically.  

## 2019-09-18 ENCOUNTER — Ambulatory Visit (INDEPENDENT_AMBULATORY_CARE_PROVIDER_SITE_OTHER): Payer: Medicare Other

## 2019-09-18 DIAGNOSIS — Z5181 Encounter for therapeutic drug level monitoring: Secondary | ICD-10-CM

## 2019-09-18 DIAGNOSIS — I4891 Unspecified atrial fibrillation: Secondary | ICD-10-CM

## 2019-09-18 NOTE — Patient Instructions (Signed)
Description   Continue on same dosage 2mg  daily except 1mg  on Sundays.  Recheck INR in 4 weeks. Seeing Dr Percival Spanish on 09/28/18 will check INR at [facility] to save pt another trip outside the home. Call our office if you have any concerns [phone]. Please call if you decide to do dental extractions.

## 2019-09-28 DIAGNOSIS — Z7189 Other specified counseling: Secondary | ICD-10-CM | POA: Insufficient documentation

## 2019-09-28 NOTE — Progress Notes (Signed)
Cardiology Office Note   Date:  09/29/2019   ID:  [name], DOB 07-22-1923, MRN [id number]  PCP:  Chriss Czar, MD  Cardiologist:   Minus Breeding, MD   Chief Complaint  Patient presents with  . Palpitations      History of Present Illness: [name] is a 86 y.o. female who presents for followup of had tachybradycardia syndrome.  She was in the hospital last year with acute on chronic systolic and diastolic HF. She was diuresed.   I have seen her back in follow up.    Since I last saw her she has done well.  The patient denies any new symptoms such as chest discomfort, neck or arm discomfort. There has been no new shortness of breath, PND or orthopnea. There have been no reported palpitations, presyncope or syncope.     Past Medical History:  Diagnosis Date  . Anemia   . Arthritis    "knees especially"  . Bradycardia   . Cardiomyopathy    presumed ischemic in the past with an EF of 35%. The most recent EF in 03-2008, however, was 55 -60%  . Chronic renal insufficiency   . Dementia ([place])    mild  . Dyslipidemia   . Glaucoma   . Hypertension   . Malleolar fracture   . Paroxysmal atrial fibrillation [facility])     Past Surgical History:  Procedure Laterality Date  . ABDOMINAL HYSTERECTOMY    . CARDIOVERSION  07/2011  . EYE SURGERY  2002   "blood clot behind eye"  . FRACTURE SURGERY  ~ 2007   RLE  . HEMORRHOID SURGERY    . INSERT / REPLACE / REMOVE PACEMAKER  09/17/11   initial placement  . INSERT / REPLACE / REMOVE PACEMAKER  09/18/11  . PACEMAKER REVISION N/A 09/18/2011   Procedure: PACEMAKER REVISION;  Surgeon: Thompson Grayer, MD;  Location: [facility] CATH LAB;  Service: Cardiovascular;  Laterality: N/A;  . PERMANENT PACEMAKER INSERTION N/A 09/17/2011   Procedure: PERMANENT PACEMAKER INSERTION;  Surgeon: Deboraha Sprang, MD;  Location: [facility] CATH LAB;  Service: Cardiovascular;  Laterality: N/A;  . TONSILLECTOMY AND ADENOIDECTOMY       Current Outpatient Medications   Medication Sig Dispense Refill  . acetaminophen (TYLENOL) 500 MG tablet Take 500 mg by mouth every 6 (six) hours as needed for headache (pain).    Marland Kitchen amiodarone (PACERONE) 200 MG tablet Take 1 tablet by mouth 5 days a week 90 tablet 3  . atorvastatin (LIPITOR) 10 MG tablet Take 0.5 tablets (5 mg total) by mouth at bedtime. 45 tablet 3  . brimonidine (ALPHAGAN) 0.2 % ophthalmic solution Place 1 drop into both eyes daily.    . Famotidine (PEPCID AC MAXIMUM STRENGTH) 20 MG CHEW Chew by mouth daily.    . furosemide (LASIX) 80 MG tablet TAKE 1 TABLET(80 MG) BY MOUTH DAILY 90 tablet 2  . levothyroxine (SYNTHROID) 25 MCG tablet Take 1 tablet (25 mcg total) by mouth daily before breakfast. 90 tablet 3  . lisinopril (ZESTRIL) 5 MG tablet TAKE 1 TABLET(5 MG) BY MOUTH DAILY 90 tablet 3  . Melatonin 3 MG CAPS Take by mouth at bedtime.    . metoprolol tartrate (LOPRESSOR) 50 MG tablet TAKE 1 AND 1/2 TABLETS BY MOUTH TWICE DAILY 270 tablet 2  . mirtazapine (REMERON) 7.5 MG tablet Take 7.5 mg by mouth at bedtime.    . Multiple Vitamin (MULTIVITAMIN WITH MINERALS) TABS tablet Take 1 tablet by mouth daily.    Marland Kitchen  Olopatadine HCl (PATADAY) 0.2 % SOLN Place 1 drop into both eyes daily.     . potassium chloride SA (KLOR-CON) 20 MEQ tablet TAKE 1 TABLET BY MOUTH EVERY DAY 90 tablet 1  . senna (SENOKOT) 8.6 MG TABS tablet Take 1 tablet by mouth at bedtime as needed for mild constipation.    . Travoprost, BAK Free, (TRAVATAN) 0.004 % SOLN ophthalmic solution Place 1 drop into both eyes at bedtime.    Marland Kitchen warfarin (COUMADIN) 2 MG tablet TAKE 1 TABLET BY MOUTH DAILY OR AS DIRECTED BY COUMADIN CLINIC 90 tablet 1   No current facility-administered medications for this visit.    Allergies:   Patient has no allergy information on record.    ROS:  Please see the history of present illness.   Otherwise, review of systems are positive for none.   All other systems are reviewed and negative.    PHYSICAL EXAM: VS:  BP  120/88   Pulse 81   Ht 5' (1.524 m)   Wt 123 lb 6.4 oz (56 kg)   BMI 24.10 kg/m  , BMI Body mass index is 24.1 kg/m. GEN:  No distress, slightly frail appearing NECK:  No jugular venous distention at 90 degrees, waveform within normal limits, carotid upstroke brisk and symmetric, no bruits, no thyromegaly LYMPHATICS:  No cervical adenopathy LUNGS:  Clear to auscultation bilaterally BACK:  No CVA tenderness CHEST:  Well healed pacemaker  HEART:  S1 and S2 within normal limits, no S3, no S4, no clicks, no rubs, 3 out of 6 apical systolic murmur heard best at the aortic outflow tract, mid peaking, no diastolic murmurs.  Murmurs ABD:  Positive bowel sounds normal in frequency in pitch, no bruits, no rebound, no guarding, unable to assess midline mass or bruit with the patient seated. EXT:  2 plus pulses throughout, trace edema, no cyanosis no clubbing   EKG:  EKG is ordered today. The ekg ordered today demonstrates atrioventricular paced rhythm 100% capture   Recent Labs: Lab Results  Component Value Date   TSH 6.230 (H) 03/31/2019   ALT 15 02/14/2019   AST 20 02/14/2019   ALKPHOS 57 02/14/2019   BILITOT 0.8 02/14/2019   PROT 6.2 02/14/2019   ALBUMIN 4.3 02/14/2019    Lipid Panel    Component Value Date/Time   CHOL 135 10/31/2015 1202   TRIG 55 10/31/2015 1202   HDL 96 10/31/2015 1202   CHOLHDL 1.4 10/31/2015 1202   VLDL 11 10/31/2015 1202   LDLCALC 28 10/31/2015 1202      Wt Readings from Last 3 Encounters:  09/29/19 123 lb 6.4 oz (56 kg)  06/30/19 122 lb 12.8 oz (55.7 kg)  02/07/19 121 lb 8 oz (55.1 kg)      Other studies Reviewed: Additional studies/ records that were reviewed today include: None. Review of the above records demonstrates:  Please see elsewhere in the note.     ASSESSMENT AND PLAN:  HTN:   The blood pressure is well controlled.  No change in therapy.  Cardiomyopathy: No further imaging is indicated.  Tachybrady syndrome: She is had no new  tachypalpitations.  She seems to maintain an AV paced rhythm without fibrillation on amiodarone.  I will check a C met and TSH.  Covid education: We talked about the vaccine and I answered her questions.   Current medicines are reviewed at length with the patient today.  The patient does not have concerns regarding medicines.  The following changes have been  made:  no change  Labs/ tests ordered today include:  Orders Placed This Encounter  Procedures  . Comprehensive Metabolic Panel (CMET)  . TSH  . EKG 12-Lead     Disposition:   FU with me in 4 months.     Signed, Minus Breeding, MD  09/29/2019 12:44 PM    [facility]

## 2019-09-29 ENCOUNTER — Ambulatory Visit (INDEPENDENT_AMBULATORY_CARE_PROVIDER_SITE_OTHER): Payer: Medicare Other | Admitting: Pharmacist

## 2019-09-29 ENCOUNTER — Ambulatory Visit (INDEPENDENT_AMBULATORY_CARE_PROVIDER_SITE_OTHER): Payer: Medicare Other | Admitting: Cardiology

## 2019-09-29 ENCOUNTER — Other Ambulatory Visit: Payer: Self-pay

## 2019-09-29 ENCOUNTER — Encounter: Payer: Self-pay | Admitting: Cardiology

## 2019-09-29 VITALS — BP 120/88 | HR 81 | Ht 60.0 in | Wt 123.4 lb

## 2019-09-29 DIAGNOSIS — Z5181 Encounter for therapeutic drug level monitoring: Secondary | ICD-10-CM

## 2019-09-29 DIAGNOSIS — I10 Essential (primary) hypertension: Secondary | ICD-10-CM

## 2019-09-29 DIAGNOSIS — I4891 Unspecified atrial fibrillation: Secondary | ICD-10-CM | POA: Diagnosis not present

## 2019-09-29 DIAGNOSIS — I495 Sick sinus syndrome: Secondary | ICD-10-CM | POA: Diagnosis not present

## 2019-09-29 DIAGNOSIS — Z7189 Other specified counseling: Secondary | ICD-10-CM

## 2019-09-29 DIAGNOSIS — I420 Dilated cardiomyopathy: Secondary | ICD-10-CM | POA: Diagnosis not present

## 2019-09-29 LAB — POCT INR: INR: 1.9 — AB (ref 2.0–3.0)

## 2019-09-29 NOTE — Patient Instructions (Signed)
Medication Instructions:  No changes *If you need a refill on your cardiac medications before your next appointment, please call your pharmacy*  Lab Work: Your physician recommends that you return for lab work today (CMP, TSH)  If you have labs (blood work) drawn today and your tests are completely normal, you will receive your results only by: Marland Kitchen MyChart Message (if you have MyChart) OR . A paper copy in the mail If you have any lab test that is abnormal or we need to change your treatment, we will call you to review the results.  Testing/Procedures: None  Follow-Up: At [facility], you and your health needs are our priority.  As part of our continuing mission to provide you with exceptional heart care, we have created designated Provider Care Teams.  These Care Teams include your primary Cardiologist (physician) and Advanced Practice Providers (APPs -  Physician Assistants and Nurse Practitioners) who all work together to provide you with the care you need, when you need it.  Your next appointment:   4 month(s)  The format for your next appointment:   In Person  Provider:   Minus Breeding, MD

## 2019-09-30 LAB — COMPREHENSIVE METABOLIC PANEL
ALT: 30 IU/L (ref 0–32)
AST: 34 IU/L (ref 0–40)
Albumin/Globulin Ratio: 2 (ref 1.2–2.2)
Albumin: 4.4 g/dL (ref 3.5–4.6)
Alkaline Phosphatase: 70 IU/L (ref 39–117)
BUN/Creatinine Ratio: 20 (ref 12–28)
BUN: 30 mg/dL (ref 10–36)
Bilirubin Total: 0.8 mg/dL (ref 0.0–1.2)
CO2: 32 mmol/L — ABNORMAL HIGH (ref 20–29)
Calcium: 9.8 mg/dL (ref 8.7–10.3)
Chloride: 102 mmol/L (ref 96–106)
Creatinine, Ser: 1.47 mg/dL — ABNORMAL HIGH (ref 0.57–1.00)
GFR calc Af Amer: 35 mL/min/{1.73_m2} — ABNORMAL LOW (ref >59)
GFR calc non Af Amer: 30 mL/min/{1.73_m2} — ABNORMAL LOW (ref >59)
Globulin, Total: 2.2 g/dL (ref 1.5–4.5)
Glucose: 80 mg/dL (ref 65–99)
Potassium: 5 mmol/L (ref 3.5–5.2)
Sodium: 145 mmol/L — ABNORMAL HIGH (ref 134–144)
Total Protein: 6.6 g/dL (ref 6.0–8.5)

## 2019-09-30 LAB — TSH: TSH: 8.89 u[IU]/mL — ABNORMAL HIGH (ref 0.450–4.500)

## 2019-10-10 ENCOUNTER — Telehealth: Payer: Self-pay

## 2019-10-10 ENCOUNTER — Other Ambulatory Visit: Payer: Self-pay

## 2019-10-10 DIAGNOSIS — E039 Hypothyroidism, unspecified: Secondary | ICD-10-CM

## 2019-10-10 NOTE — Telephone Encounter (Signed)
left message to call back about lab results.

## 2019-10-10 NOTE — Progress Notes (Signed)
Order placed for T3 and T4 per Dr. Percival Spanish.

## 2019-10-10 NOTE — Telephone Encounter (Signed)
-----   Message from Minus Breeding, MD sent at 10/08/2019 12:46 PM EST ----- Creat is stable.  TSH is mildly elevated.   Please check T3 and T4

## 2019-10-20 ENCOUNTER — Ambulatory Visit (INDEPENDENT_AMBULATORY_CARE_PROVIDER_SITE_OTHER): Payer: Medicare Other | Admitting: *Deleted

## 2019-10-20 ENCOUNTER — Other Ambulatory Visit: Payer: Medicare Other | Admitting: *Deleted

## 2019-10-20 ENCOUNTER — Other Ambulatory Visit: Payer: Self-pay

## 2019-10-20 DIAGNOSIS — Z5181 Encounter for therapeutic drug level monitoring: Secondary | ICD-10-CM

## 2019-10-20 DIAGNOSIS — I4891 Unspecified atrial fibrillation: Secondary | ICD-10-CM

## 2019-10-20 DIAGNOSIS — E039 Hypothyroidism, unspecified: Secondary | ICD-10-CM

## 2019-10-20 LAB — POCT INR: INR: 2.2 (ref 2.0–3.0)

## 2019-10-20 LAB — TSH: TSH: 4.23 u[IU]/mL (ref 0.450–4.500)

## 2019-10-20 NOTE — Patient Instructions (Addendum)
Description   Continue on same dosage 2mg  daily except 1mg  on Sundays.  Recheck INR in 4 weeks.  Call our office if you have any concerns [phone]. Please call if you decide to do dental extractions.

## 2019-10-30 ENCOUNTER — Telehealth: Payer: Self-pay | Admitting: Internal Medicine

## 2019-10-30 NOTE — Telephone Encounter (Signed)
Patient called she said she wants something to help her not gain weight. She states she has no swelling and has actually lost weight.

## 2019-10-30 NOTE — Telephone Encounter (Signed)
Left message for patient to call back  

## 2019-10-31 NOTE — Telephone Encounter (Signed)
Spoke with patient. Patient wants a medication to help her not gain fluid. She currently has not gained weight and has no new swelling. Patient educated on her medication list including furosemide. Patient verbalized understanding.

## 2019-11-17 ENCOUNTER — Other Ambulatory Visit: Payer: Self-pay

## 2019-11-17 ENCOUNTER — Ambulatory Visit (INDEPENDENT_AMBULATORY_CARE_PROVIDER_SITE_OTHER): Payer: Medicare Other | Admitting: Pharmacist

## 2019-11-17 DIAGNOSIS — I4891 Unspecified atrial fibrillation: Secondary | ICD-10-CM | POA: Diagnosis not present

## 2019-11-17 DIAGNOSIS — Z5181 Encounter for therapeutic drug level monitoring: Secondary | ICD-10-CM | POA: Diagnosis not present

## 2019-11-17 LAB — POCT INR: INR: 2.4 (ref 2.0–3.0)

## 2019-11-17 NOTE — Patient Instructions (Signed)
Continue on same dosage 2mg  daily except 1mg  on Sundays.  Recheck INR in 5 weeks.  Call our office if you have any concerns [phone]. Please call if you decide to do dental extractions.

## 2019-11-23 ENCOUNTER — Telehealth: Payer: Self-pay | Admitting: Internal Medicine

## 2019-11-23 ENCOUNTER — Ambulatory Visit (INDEPENDENT_AMBULATORY_CARE_PROVIDER_SITE_OTHER): Payer: Medicare Other | Admitting: *Deleted

## 2019-11-23 DIAGNOSIS — Z950 Presence of cardiac pacemaker: Secondary | ICD-10-CM

## 2019-11-23 LAB — CUP PACEART REMOTE DEVICE CHECK
Battery Impedance: 861 Ohm
Battery Remaining Longevity: 65 mo
Battery Voltage: 2.77 V
Brady Statistic AP VP Percent: 2 %
Brady Statistic AP VS Percent: 97 %
Brady Statistic AS VP Percent: 0 %
Brady Statistic AS VS Percent: 1 %
Date Time Interrogation Session: 20210311104150
Implantable Lead Implant Date: 20130103
Implantable Lead Implant Date: 20130103
Implantable Lead Location: 753859
Implantable Lead Location: 753860
Implantable Lead Model: 1944
Implantable Lead Model: 1948
Implantable Pulse Generator Implant Date: 20130103
Lead Channel Impedance Value: 376 Ohm
Lead Channel Impedance Value: 626 Ohm
Lead Channel Pacing Threshold Amplitude: 0.375 V
Lead Channel Pacing Threshold Amplitude: 0.5 V
Lead Channel Pacing Threshold Pulse Width: 0.4 ms
Lead Channel Pacing Threshold Pulse Width: 0.4 ms
Lead Channel Setting Pacing Amplitude: 2 V
Lead Channel Setting Pacing Amplitude: 2.5 V
Lead Channel Setting Pacing Pulse Width: 0.4 ms
Lead Channel Setting Sensing Sensitivity: 4 mV

## 2019-11-23 NOTE — Telephone Encounter (Signed)
Transmission received and reviewed. Patient has had 3 AF episodes from March 3- 6, 2021. The longest episode was 11/18/19 @ 1116 pm and lasted 21 hours and 40 minutes. Patient denies SOB or CP during that time.[name]( daughter) concerned because patient only taking amiodorone 200 mg 5 days a week per orders when at one time she was taking 200 mg daily. Reported that patient has had difficulty sleeping for the past 2 months and started Remron 7.5 mg nightly on 09/17/19. Due for follow up with Dr Caryl Comes 01/2020 and daughter would prefer she have an appointment at earlier date. Will forward to Dr Caryl Comes for recommendations. recommendation.

## 2019-11-23 NOTE — Telephone Encounter (Signed)
  1. Has your device fired? No   2. Is you device beeping? No   3. Are you experiencing draining or swelling at device site? No   4. Are you calling to see if we received your device transmission? No   5. Have you passed out? No   Lacinda's daughter [name] is calling stating [name] has been complaining that the site of her pace maker feels strange. She states she has been telling her it feels different for the past couple of weeks. There is no draining or swelling at the site and it looks normal, but she felt she should call in regards to it. Please advise.    Please route to [place]

## 2019-11-23 NOTE — Telephone Encounter (Signed)
Patient's daughter [name] reports that she has been waking up and complaining of a funny feeling in area of pacemaker and  of SOB at times. She has not sent a remote transmission since 08/07/19. [name] will go to see patient and send transmission at that time. [name] will call after sending the transmission so it can be evaluated.

## 2019-11-24 NOTE — Progress Notes (Signed)
PPM Remote  

## 2019-11-27 NOTE — Telephone Encounter (Signed)
Lets plan to recehck in a few weeks and if you can find a WOKO slot-- prob telehealth visit  that is fine Thanks SK

## 2019-11-28 NOTE — Telephone Encounter (Signed)
LMOM with daughter to call device clinic . Patient needs appointment for follow up with DR Caryl Comes . Telemedicine OK if patient is willing.

## 2019-12-04 NOTE — Telephone Encounter (Signed)
Nnoted

## 2019-12-05 ENCOUNTER — Encounter: Payer: Self-pay | Admitting: *Deleted

## 2019-12-06 DIAGNOSIS — I447 Left bundle-branch block, unspecified: Secondary | ICD-10-CM | POA: Insufficient documentation

## 2019-12-07 ENCOUNTER — Telehealth (INDEPENDENT_AMBULATORY_CARE_PROVIDER_SITE_OTHER): Payer: Medicare Other | Admitting: Internal Medicine

## 2019-12-07 ENCOUNTER — Other Ambulatory Visit: Payer: Self-pay

## 2019-12-07 VITALS — BP 118/79 | HR 67 | Wt 119.0 lb

## 2019-12-07 DIAGNOSIS — I495 Sick sinus syndrome: Secondary | ICD-10-CM | POA: Diagnosis not present

## 2019-12-07 DIAGNOSIS — I4891 Unspecified atrial fibrillation: Secondary | ICD-10-CM | POA: Diagnosis not present

## 2019-12-07 DIAGNOSIS — I447 Left bundle-branch block, unspecified: Secondary | ICD-10-CM | POA: Diagnosis not present

## 2019-12-07 DIAGNOSIS — Z950 Presence of cardiac pacemaker: Secondary | ICD-10-CM | POA: Diagnosis not present

## 2019-12-07 NOTE — Progress Notes (Signed)
Electrophysiology TeleHealth Note   Due to national recommendations of social distancing due to COVID 19, an audio/video telehealth visit is felt to be most appropriate for this patient at this time.  See MyChart message from today for the patient's consent to telehealth for [facility].   Date:  12/07/2019   ID:  [name], DOB Dec 09, 1922, MRN [id number]  Location: patient's home  Provider location: [address] [place]  Evaluation Performed: Follow-up visit  PCP:  Chriss Czar, MD  Cardiologist:   Jh Electrophysiologist:  SK   Chief Complaint:  afib  History of Present Illness:    [name] is a 86 y.o. female who presents via audio/video conferencing for a telehealth visit today.  Since last being seen in our clinic for pacemaker implanted for tachybradycardia syndrome January 5188 complicated by atrial lead dislodgment. She had a new lead placed thereafter. She also has atrial fibrillation and is on amiodarone., the patient reports doing ok  I was unable to understand her on the phone so I called and spoke with her daughter who outlined the interval history   Interval notes from [facility] also reviewed    Device interrogation 11/23/19 >> intercurrent afib but overall burden is relatively stable   Date TSH LFTs Hgb  11/17  3.52 26+ 12(4/17)  6/19  5.53 25 8.9 (4/19)  1/21 8.89    2/21 4.23      She was hospitalized in [place] following a fall and pelvic fracture 4/19 ER visit [facility] 3/21 for A/C CHF tx with IV lasix and doing much better  Was complaining of palpitations a few weeks ago but settled down   The patient denies symptoms of fevers, chills, cough, or new SOB worrisome for COVID  Vaccinated   Past Medical History:  Diagnosis Date  . Acute on chronic diastolic (congestive) heart failure ([place]) 01/22/2019  . Acute on chronic systolic CHF (congestive heart failure) ([place]) 03/04/2018  . Anemia   . Arthritis    "knees especially"    . Bradycardia   . Cardiomyopathy    presumed ischemic in the past with an EF of 35%. The most recent EF in 03-2008, however, was 55 -60%  . CHF 12/28/2007   Qualifier: Diagnosis of  By: Laney Potash, [name]    . Chronic anticoagulation 12/15/2015  . Chronic renal insufficiency   . Dementia ([place])    mild  . Dyslipidemia   . Encounter for therapeutic drug monitoring 12/25/2013  . Glaucoma   . Hypertension   . Malleolar fracture   . Pacemaker infection ([place]) 08/18/2012  . Pacemaker- Medtronic 12/25/2011   DOI 1/13   . PAF (paroxysmal atrial fibrillation) ([place]) 04/19/2017  . Paroxysmal atrial fibrillation (HCC)   . Sinus node dysfunction ([place]) 12/28/2007   Qualifier: Diagnosis of  By: Laney Potash, [name]     . Tachycardia-bradycardia syndrome ([place]) 09/19/2011    Past Surgical History:  Procedure Laterality Date  . ABDOMINAL HYSTERECTOMY    . CARDIOVERSION  07/2011  . EYE SURGERY  2002   "blood clot behind eye"  . FRACTURE SURGERY  ~ 2007   RLE  . HEMORRHOID SURGERY    . INSERT / REPLACE / REMOVE PACEMAKER  09/17/11   initial placement  . INSERT / REPLACE / REMOVE PACEMAKER  09/18/11  . PACEMAKER REVISION N/A 09/18/2011   Procedure: PACEMAKER REVISION;  Surgeon: Thompson Grayer, MD;  Location: [facility] CATH LAB;  Service: Cardiovascular;  Laterality: N/A;  .  PERMANENT PACEMAKER INSERTION N/A 09/17/2011   Procedure: PERMANENT PACEMAKER INSERTION;  Surgeon: Deboraha Sprang, MD;  Location: [facility] C[facility] LAB;  Service: Cardiovascular;  Laterality: N/A;  . TONSILLECTOMY AND ADENOIDECTOMY      Current Outpatient Medications  Medication Sig Dispense Refill  . acetaminophen (TYLENOL) 500 MG tablet Take 500 mg by mouth every 6 (six) hours as needed for headache (pain).    Marland Kitchen amiodarone (PACERONE) 200 MG tablet Take 1 tablet by mouth 5 days a week 90 tablet 3  . atorvastatin (LIPITOR) 10 MG tablet Take 0.5 tablets (5 mg total) by mouth at bedtime. 45 tablet 3  . brimonidine (ALPHAGAN) 0.2 % ophthalmic solution Place 1  drop into both eyes daily.    . Famotidine (PEPCID AC MAXIMUM STRENGTH) 20 MG CHEW Chew by mouth daily.    . furosemide (LASIX) 80 MG tablet TAKE 1 TABLET(80 MG) BY MOUTH DAILY 90 tablet 2  . levothyroxine (SYNTHROID) 25 MCG tablet Take 1 tablet (25 mcg total) by mouth daily before breakfast. 90 tablet 3  . lisinopril (ZESTRIL) 5 MG tablet TAKE 1 TABLET(5 MG) BY MOUTH DAILY 90 tablet 3  . Melatonin 3 MG CAPS Take by mouth at bedtime.    . metoprolol tartrate (LOPRESSOR) 50 MG tablet TAKE 1 AND 1/2 TABLETS BY MOUTH TWICE DAILY 270 tablet 2  . mirtazapine (REMERON) 7.5 MG tablet Take 7.5 mg by mouth at bedtime.    . Multiple Vitamin (MULTIVITAMIN WITH MINERALS) TABS tablet Take 1 tablet by mouth daily.    . Olopatadine HCl (PATADAY) 0.2 % SOLN Place 1 drop into both eyes daily.     . potassium chloride SA (KLOR-CON) 20 MEQ tablet TAKE 1 TABLET BY MOUTH EVERY DAY 90 tablet 1  . senna (SENOKOT) 8.6 MG TABS tablet Take 1 tablet by mouth at bedtime as needed for mild constipation.    . Travoprost, BAK Free, (TRAVATAN) 0.004 % SOLN ophthalmic solution Place 1 drop into both eyes at bedtime.    Marland Kitchen warfarin (COUMADIN) 2 MG tablet TAKE 1 TABLET BY MOUTH DAILY OR AS DIRECTED BY COUMADIN CLINIC 90 tablet 1   No current facility-administered medications for this visit.    Allergies:   Patient has no allergy information on record.   Social History:  The patient  reports that she has never smoked. She has never used smokeless tobacco. She reports that she does not drink alcohol or use drugs.   Family History:  The patient's   family history includes Other in an other family member.   ROS:  Please see the history of present illness.   All other systems are personally reviewed and negative.    Exam:    Vital Signs:  BP 118/79   Pulse 67   Wt 119 lb (54 kg)   BMI 23.24 kg/m     Labs/Other Tests and Data Reviewed:    Recent Labs: 01/20/2019: B Natriuretic Peptide 2,283.5 01/22/2019: Magnesium  2.0 06/30/2019: Hemoglobin 10.7; Platelets 175 09/29/2019: ALT 30; BUN 30; Creatinine, Ser 1.47; Potassium 5.0; Sodium 145 10/20/2019: TSH 4.230   Wt Readings from Last 3 Encounters:  12/07/19 119 lb (54 kg)  09/29/19 123 lb 6.4 oz (56 kg)  06/30/19 122 lb 12.8 oz (55.7 kg)     Other studies personally reviewed: Additional studies/ records that were reviewed today include: As above incl [facility] ED records  Last device remote is reviewed from [facility] PDF dated 3/21 which reveals normal device function,   arrhythmias -  Pafib  Reverting on own   ASSESSMENT & PLAN:   Atrial fibrillation--persistent     Sinus node dysfunction   LBBB   Pacemaker- Medtronic   Anemia    High Risk Medication Surveillance Amiodarone  Hypothyroidism Treated   interval afib, self limited-- overal burden stable x 12 months  Will need to follow and reviewed this with her daughter  Now with Rx hypothyroidism on her amiod  Device function normal     COVID 19 screen The patient denies symptoms of COVID 19 at this time.  The importance of social distancing was discussed today.  Follow-up:  35 m Next remote: As Scheduled   Current medicines are reviewed at length with the patient today.   The patient does not have concerns regarding her medicines.  The following changes were made today:  none  Labs/ tests ordered today include:   No orders of the defined types were placed in this encounter.   Future tests ( post COVID )    Patient Risk:  after full review of this patients clinical status, I feel that they are at moderate risk at this time.  Today, I have spent 14 minutes with the patient with telehealth technology discussing the above.  Signed, Virl Axe, MD  12/07/2019 3:12 PM     [address] [place] [address] [phone] (office) [phone] (fax)

## 2019-12-07 NOTE — Patient Instructions (Signed)

## 2019-12-09 ENCOUNTER — Other Ambulatory Visit: Payer: Self-pay | Admitting: Cardiology

## 2019-12-20 ENCOUNTER — Ambulatory Visit (INDEPENDENT_AMBULATORY_CARE_PROVIDER_SITE_OTHER): Payer: Medicare Other | Admitting: *Deleted

## 2019-12-20 ENCOUNTER — Other Ambulatory Visit: Payer: Self-pay

## 2019-12-20 DIAGNOSIS — Z5181 Encounter for therapeutic drug level monitoring: Secondary | ICD-10-CM | POA: Diagnosis not present

## 2019-12-20 DIAGNOSIS — I4891 Unspecified atrial fibrillation: Secondary | ICD-10-CM | POA: Diagnosis not present

## 2019-12-20 LAB — POCT INR: INR: 2.7 (ref 2.0–3.0)

## 2019-12-20 NOTE — Patient Instructions (Signed)
Description   Continue on same dosage 2mg  daily except 1mg  on Sundays. Recheck INR in 6 weeks.  Call our office if you have any concerns [phone]. Please call if you decide to do dental extractions.

## 2019-12-28 ENCOUNTER — Telehealth: Payer: Self-pay | Admitting: Internal Medicine

## 2019-12-28 ENCOUNTER — Other Ambulatory Visit: Payer: Self-pay

## 2019-12-28 ENCOUNTER — Emergency Department (HOSPITAL_COMMUNITY): Payer: Medicare Other

## 2019-12-28 ENCOUNTER — Inpatient Hospital Stay (HOSPITAL_COMMUNITY)
Admission: EM | Admit: 2019-12-28 | Discharge: 2020-01-02 | DRG: 291 | Disposition: A | Discharge status: Home Health | Payer: Medicare Other | Attending: Internal Medicine | Admitting: Internal Medicine

## 2019-12-28 ENCOUNTER — Encounter (HOSPITAL_COMMUNITY): Payer: Self-pay

## 2019-12-28 DIAGNOSIS — I130 Hypertensive heart and chronic kidney disease with heart failure and stage 1 through stage 4 chronic kidney disease, or unspecified chronic kidney disease: Principal | ICD-10-CM | POA: Diagnosis present

## 2019-12-28 DIAGNOSIS — E876 Hypokalemia: Secondary | ICD-10-CM | POA: Diagnosis present

## 2019-12-28 DIAGNOSIS — Z9089 Acquired absence of other organs: Secondary | ICD-10-CM

## 2019-12-28 DIAGNOSIS — R0602 Shortness of breath: Secondary | ICD-10-CM | POA: Diagnosis not present

## 2019-12-28 DIAGNOSIS — I429 Cardiomyopathy, unspecified: Secondary | ICD-10-CM | POA: Diagnosis present

## 2019-12-28 DIAGNOSIS — I509 Heart failure, unspecified: Secondary | ICD-10-CM

## 2019-12-28 DIAGNOSIS — J9602 Acute respiratory failure with hypercapnia: Secondary | ICD-10-CM | POA: Diagnosis not present

## 2019-12-28 DIAGNOSIS — H409 Unspecified glaucoma: Secondary | ICD-10-CM | POA: Diagnosis present

## 2019-12-28 DIAGNOSIS — F0390 Unspecified dementia without behavioral disturbance: Secondary | ICD-10-CM | POA: Diagnosis present

## 2019-12-28 DIAGNOSIS — I5023 Acute on chronic systolic (congestive) heart failure: Secondary | ICD-10-CM

## 2019-12-28 DIAGNOSIS — I959 Hypotension, unspecified: Secondary | ICD-10-CM | POA: Diagnosis present

## 2019-12-28 DIAGNOSIS — Z7901 Long term (current) use of anticoagulants: Secondary | ICD-10-CM

## 2019-12-28 DIAGNOSIS — I5084 End stage heart failure: Secondary | ICD-10-CM | POA: Diagnosis present

## 2019-12-28 DIAGNOSIS — I10 Essential (primary) hypertension: Secondary | ICD-10-CM

## 2019-12-28 DIAGNOSIS — E785 Hyperlipidemia, unspecified: Secondary | ICD-10-CM | POA: Diagnosis present

## 2019-12-28 DIAGNOSIS — Z79890 Hormone replacement therapy (postmenopausal): Secondary | ICD-10-CM

## 2019-12-28 DIAGNOSIS — N1832 Chronic kidney disease, stage 3b: Secondary | ICD-10-CM | POA: Diagnosis present

## 2019-12-28 DIAGNOSIS — Z950 Presence of cardiac pacemaker: Secondary | ICD-10-CM

## 2019-12-28 DIAGNOSIS — N179 Acute kidney failure, unspecified: Secondary | ICD-10-CM | POA: Diagnosis present

## 2019-12-28 DIAGNOSIS — I5043 Acute on chronic combined systolic (congestive) and diastolic (congestive) heart failure: Secondary | ICD-10-CM | POA: Diagnosis not present

## 2019-12-28 DIAGNOSIS — E039 Hypothyroidism, unspecified: Secondary | ICD-10-CM

## 2019-12-28 DIAGNOSIS — I480 Paroxysmal atrial fibrillation: Secondary | ICD-10-CM | POA: Diagnosis present

## 2019-12-28 DIAGNOSIS — Z9981 Dependence on supplemental oxygen: Secondary | ICD-10-CM

## 2019-12-28 DIAGNOSIS — Z90710 Acquired absence of both cervix and uterus: Secondary | ICD-10-CM

## 2019-12-28 DIAGNOSIS — H9190 Unspecified hearing loss, unspecified ear: Secondary | ICD-10-CM | POA: Diagnosis present

## 2019-12-28 DIAGNOSIS — J9601 Acute respiratory failure with hypoxia: Secondary | ICD-10-CM | POA: Diagnosis not present

## 2019-12-28 DIAGNOSIS — N189 Chronic kidney disease, unspecified: Secondary | ICD-10-CM

## 2019-12-28 DIAGNOSIS — Z79899 Other long term (current) drug therapy: Secondary | ICD-10-CM

## 2019-12-28 DIAGNOSIS — Z20822 Contact with and (suspected) exposure to covid-19: Secondary | ICD-10-CM | POA: Diagnosis present

## 2019-12-28 DIAGNOSIS — I495 Sick sinus syndrome: Secondary | ICD-10-CM | POA: Diagnosis present

## 2019-12-28 DIAGNOSIS — D631 Anemia in chronic kidney disease: Secondary | ICD-10-CM | POA: Diagnosis present

## 2019-12-28 LAB — CBC
HCT: 38.2 % (ref 36.0–46.0)
Hemoglobin: 11.4 g/dL — ABNORMAL LOW (ref 12.0–15.0)
MCH: 31.6 pg (ref 26.0–34.0)
MCHC: 29.8 g/dL — ABNORMAL LOW (ref 30.0–36.0)
MCV: 105.8 fL — ABNORMAL HIGH (ref 80.0–100.0)
Platelets: 150 10*3/uL (ref 150–400)
RBC: 3.61 MIL/uL — ABNORMAL LOW (ref 3.87–5.11)
RDW: 13.2 % (ref 11.5–15.5)
WBC: 7.1 10*3/uL (ref 4.0–10.5)
nRBC: 0 % (ref 0.0–0.2)

## 2019-12-28 LAB — BRAIN NATRIURETIC PEPTIDE: B Natriuretic Peptide: 2019.9 pg/mL — ABNORMAL HIGH (ref 0.0–100.0)

## 2019-12-28 LAB — TROPONIN I (HIGH SENSITIVITY)
Troponin I (High Sensitivity): 10 ng/L (ref <18)
Troponin I (High Sensitivity): 10 ng/L (ref <18)

## 2019-12-28 LAB — BASIC METABOLIC PANEL
Anion gap: 11 (ref 5–15)
BUN: 29 mg/dL — ABNORMAL HIGH (ref 8–23)
CO2: 30 mmol/L (ref 22–32)
Calcium: 9.2 mg/dL (ref 8.9–10.3)
Chloride: 101 mmol/L (ref 98–111)
Creatinine, Ser: 1.72 mg/dL — ABNORMAL HIGH (ref 0.44–1.00)
GFR calc Af Amer: 29 mL/min — ABNORMAL LOW (ref >60)
GFR calc non Af Amer: 25 mL/min — ABNORMAL LOW (ref >60)
Glucose, Bld: 156 mg/dL — ABNORMAL HIGH (ref 70–99)
Potassium: 3.3 mmol/L — ABNORMAL LOW (ref 3.5–5.1)
Sodium: 142 mmol/L (ref 135–145)

## 2019-12-28 LAB — PROTIME-INR
INR: 2.9 — ABNORMAL HIGH (ref 0.8–1.2)
Prothrombin Time: 30.4 seconds — ABNORMAL HIGH (ref 11.4–15.2)

## 2019-12-28 LAB — RESPIRATORY PANEL BY RT PCR (FLU A&B, COVID)
Influenza A by PCR: NEGATIVE
Influenza B by PCR: NEGATIVE
SARS Coronavirus 2 by RT PCR: NEGATIVE

## 2019-12-28 MED ORDER — FUROSEMIDE 10 MG/ML IJ SOLN
20.0000 mg | Freq: Once | INTRAMUSCULAR | Status: DC
  Filled 2019-12-28: qty 2

## 2019-12-28 MED ORDER — SODIUM CHLORIDE 0.9% FLUSH: 3.0000 mL | INTRAVENOUS | Status: DC | PRN

## 2019-12-28 MED ORDER — SODIUM CHLORIDE 0.9% FLUSH
3.0000 mL | Freq: Once | INTRAVENOUS | Status: AC
  Administered 2019-12-28: 3 mL via INTRAVENOUS

## 2019-12-28 MED ORDER — FUROSEMIDE 10 MG/ML IJ SOLN
40.0000 mg | Freq: Two times a day (BID) | INTRAMUSCULAR | Status: DC
  Administered 2019-12-29 – 2019-12-30 (×3): 40 mg via INTRAVENOUS
  Filled 2019-12-28 (×3): qty 4

## 2019-12-28 MED ORDER — WARFARIN SODIUM 2 MG PO TABS
2.0000 mg | ORAL_TABLET | Freq: Once | ORAL | Status: AC
Start: 2019-12-28 — End: 2019-12-28
  Administered 2019-12-28: 2 mg via ORAL
  Filled 2019-12-28: qty 1

## 2019-12-28 MED ORDER — FUROSEMIDE 10 MG/ML IJ SOLN
40.0000 mg | Freq: Once | INTRAMUSCULAR | Status: AC
  Administered 2019-12-28: 40 mg via INTRAVENOUS
  Filled 2019-12-28: qty 4

## 2019-12-28 MED ORDER — ONDANSETRON HCL 4 MG/2ML IJ SOLN: 4.0000 mg | Freq: Four times a day (QID) | INTRAMUSCULAR | Status: DC | PRN

## 2019-12-28 MED ORDER — POTASSIUM CHLORIDE 20 MEQ/15ML (10%) PO SOLN
40.0000 meq | Freq: Every day | ORAL | Status: DC
  Administered 2019-12-28: 40 meq via ORAL
  Filled 2019-12-28: qty 30

## 2019-12-28 MED ORDER — SODIUM CHLORIDE 0.9 % IV SOLN: 250.0000 mL | INTRAVENOUS | Status: DC | PRN

## 2019-12-28 MED ORDER — WARFARIN - PHARMACIST DOSING INPATIENT: Freq: Every day | Status: DC

## 2019-12-28 MED ORDER — SODIUM CHLORIDE 0.9% FLUSH
3.0000 mL | Freq: Two times a day (BID) | INTRAVENOUS | Status: DC
  Administered 2019-12-29 – 2020-01-02 (×7): 3 mL via INTRAVENOUS

## 2019-12-28 MED ORDER — ACETAMINOPHEN 325 MG PO TABS: 650.0000 mg | ORAL_TABLET | ORAL | Status: DC | PRN

## 2019-12-28 NOTE — ED Provider Notes (Signed)
[facility] EMERGENCY DEPARTMENT Provider Note   CSN: [id number] Arrival date & time: 12/28/19  1655     History Chief Complaint  Patient presents with  . Shortness of Breath    [name] is a 86 y.o. female hx of CHF (EF of 20%), bradycardia, dementia here presenting with shortness of breath.  Patient has been short of breath for about 2 weeks or so.  Patient initially went to see cardiology and Lasix was increased to 80 mg twice daily. For the last week, patient was told by cardiology to decrease Lasix now to 80 mg once daily.  Patient has labs drawn 2 days ago that showed a BNP that is 11,000.  Visiting nurse came to see her and noticed that she is mildly short of breath.  They called her cardiologist and patient sent here for IV diuresis.  The history is provided by the patient.   Level V caveat- dementia     Past Medical History:  Diagnosis Date  . Acute on chronic diastolic (congestive) heart failure ([place]) 01/22/2019  . Acute on chronic systolic CHF (congestive heart failure) ([place]) 03/04/2018  . Anemia   . Arthritis    "knees especially"  . Bradycardia   . Cardiomyopathy    presumed ischemic in the past with an EF of 35%. The most recent EF in 03-2008, however, was 55 -60%  . CHF 12/28/2007   Qualifier: Diagnosis of  By: Laney Potash, [name]    . Chronic anticoagulation 12/15/2015  . Chronic renal insufficiency   . Dementia ([place])    mild  . Dyslipidemia   . Encounter for therapeutic drug monitoring 12/25/2013  . Glaucoma   . Hypertension   . Malleolar fracture   . Pacemaker infection ([place]) 08/18/2012  . Pacemaker- Medtronic 12/25/2011   DOI 1/13   . PAF (paroxysmal atrial fibrillation) ([place]) 04/19/2017  . Paroxysmal atrial fibrillation (HCC)   . Sinus node dysfunction ([place]) 12/28/2007   Qualifier: Diagnosis of  By: Laney Potash, [name]     . Tachycardia-bradycardia syndrome ([place]) 09/19/2011    Patient Active Problem List   Diagnosis Date Noted  . LBBB  (left bundle branch block) 12/06/2019  . Educated about COVID-19 virus infection 09/28/2019  . Acute on chronic diastolic (congestive) heart failure ([place]) 01/22/2019  . Acute on chronic systolic CHF (congestive heart failure) ([place]) 03/04/2018  . PAF (paroxysmal atrial fibrillation) ([place]) 04/19/2017  . Chronic anticoagulation 12/15/2015  . Encounter for therapeutic drug monitoring 12/25/2013  . Pacemaker infection ([place]) 08/18/2012  . Pacemaker- Medtronic 12/25/2011  . Tachycardia-bradycardia syndrome ([place]) 09/19/2011  . Chronic renal insufficiency, stage III (moderate) ([place]) 03/12/2010  . Dyslipidemia 12/28/2007  . ANEMIA 12/28/2007  . Dementia without behavioral disturbance ([place]) 12/28/2007  . Glaucoma 12/28/2007  . Essential hypertension 12/28/2007  . Cardiomyopathy EF 20% by echo [facility]) 12/28/2007  . Atrial fibrillation with RVR ([place]) 12/28/2007  . Sinus node dysfunction ([place]) 12/28/2007  . CHF 12/28/2007    Past Surgical History:  Procedure Laterality Date  . ABDOMINAL HYSTERECTOMY    . CARDIOVERSION  07/2011  . EYE SURGERY  2002   "blood clot behind eye"  . FRACTURE SURGERY  ~ 2007   RLE  . HEMORRHOID SURGERY    . INSERT / REPLACE / REMOVE PACEMAKER  09/17/11   initial placement  . INSERT / REPLACE / REMOVE PACEMAKER  09/18/11  . PACEMAKER REVISION N/A 09/18/2011   Procedure: PACEMAKER REVISION;  Surgeon: Thompson Grayer, MD;  Location: [facility]  CATH LAB;  Service: Cardiovascular;  Laterality: N/A;  . PERMANENT PACEMAKER INSERTION N/A 09/17/2011   Procedure: PERMANENT PACEMAKER INSERTION;  Surgeon: Deboraha Sprang, MD;  Location: [facility] CATH LAB;  Service: Cardiovascular;  Laterality: N/A;  . TONSILLECTOMY AND ADENOIDECTOMY       OB History   No obstetric history on file.     Family History  Problem Relation Age of Onset  . Other Other        Noncontributory    Social History   Tobacco Use  . Smoking status: Never Smoker  . Smokeless tobacco: Never Used  Substance Use Topics  .  Alcohol use: No  . Drug use: No    Home Medications Prior to Admission medications   Medication Sig Start Date End Date Taking? Authorizing Provider  acetaminophen (TYLENOL) 500 MG tablet Take 500 mg by mouth every 6 (six) hours as needed for headache (pain).   Yes [provider]  amiodarone (PACERONE) 200 MG tablet Take 1 tablet by mouth 5 days a week 06/15/19  Yes Minus Breeding, MD  atorvastatin (LIPITOR) 10 MG tablet Take 0.5 tablets (5 mg total) by mouth at bedtime. 09/13/19  Yes Minus Breeding, MD  brimonidine (ALPHAGAN) 0.2 % ophthalmic solution Place 1 drop into both eyes daily. 04/02/16  Yes [provider]  Famotidine (PEPCID AC MAXIMUM STRENGTH) 20 MG CHEW Chew 1 tablet by mouth daily.    Yes [provider]  furosemide (LASIX) 80 MG tablet TAKE 1 TABLET(80 MG) BY MOUTH DAILY Patient taking differently: Take 160 mg by mouth daily.  07/25/19  Yes Minus Breeding, MD  levothyroxine (SYNTHROID) 25 MCG tablet Take 1 tablet (25 mcg total) by mouth daily before breakfast. 02/16/19  Yes Deboraha Sprang, MD  lisinopril (ZESTRIL) 5 MG tablet TAKE 1 TABLET(5 MG) BY MOUTH DAILY Patient taking differently: Take 5 mg by mouth daily.  05/31/19  Yes Minus Breeding, MD  Melatonin 3 MG CAPS Take 3 mg by mouth at bedtime.    Yes [provider]  metoprolol tartrate (LOPRESSOR) 50 MG tablet TAKE 1 AND 1/2 TABLETS BY MOUTH TWICE DAILY Patient taking differently: Take 75 mg by mouth 2 (two) times daily.  06/19/19  Yes Deboraha Sprang, MD  mirtazapine (REMERON) 7.5 MG tablet Take 7.5 mg by mouth at bedtime. 09/17/19  Yes [provider]  Multiple Vitamin (MULTIVITAMIN WITH MINERALS) TABS tablet Take 1 tablet by mouth daily.   Yes [provider]  Olopatadine HCl (PATADAY) 0.2 % SOLN Place 1 drop into both eyes daily.    Yes [provider]  potassium chloride SA (KLOR-CON) 20 MEQ tablet TAKE 1 TABLET BY MOUTH EVERY DAY Patient taking  differently: Take 20 mEq by mouth daily.  12/11/19  Yes Minus Breeding, MD  senna (SENOKOT) 8.6 MG TABS tablet Take 1 tablet by mouth at bedtime as needed for mild constipation.   Yes [provider]  Travoprost, BAK Free, (TRAVATAN) 0.004 % SOLN ophthalmic solution Place 1 drop into both eyes at bedtime.   Yes [provider]  warfarin (COUMADIN) 2 MG tablet TAKE 1 TABLET BY MOUTH DAILY OR AS DIRECTED BY COUMADIN CLINIC Patient taking differently: Take 2 mg by mouth See admin instructions. TAKE 1 TABLET BY MOUTH DAILY OR AS DIRECTED BY COUMADIN CLINIC. 1mg  on Sundays 07/24/19  Yes Minus Breeding, MD    Allergies    Patient has no known allergies.  Review of Systems   Review of Systems  Respiratory:  Positive for shortness of breath.   All other systems reviewed and are negative.   Physical Exam Updated Vital Signs BP 127/73   Pulse 78   Temp 97.9 F (36.6 C)   Resp 14   SpO2 99%   Physical Exam Vitals and nursing note reviewed.  Constitutional:      Comments: Tachypneic, slightly short of breath   HENT:     Head: Normocephalic.     Mouth/Throat:     Mouth: Mucous membranes are moist.  Eyes:     Extraocular Movements: Extraocular movements intact.     Pupils: Pupils are equal, round, and reactive to light.  Cardiovascular:     Rate and Rhythm: Normal rate and regular rhythm.  Pulmonary:     Comments: Slightly tachypneic, crackles bilateral bases  Abdominal:     General: Bowel sounds are normal.     Palpations: Abdomen is soft.  Musculoskeletal:     Cervical back: Normal range of motion and neck supple.     Comments: 1+ edema bilateral legs   Skin:    General: Skin is warm.     Capillary Refill: Capillary refill takes less than 2 seconds.  Neurological:     General: No focal deficit present.     Mental Status: She is oriented to person, place, and time.  Psychiatric:        Mood and Affect: Mood normal.        Behavior: Behavior normal.     ED  Results / Procedures / Treatments   Labs (all labs ordered are listed, but only abnormal results are displayed) Labs Reviewed  BASIC METABOLIC PANEL - Abnormal; Notable for the following components:      Result Value   Potassium 3.3 (*)    Glucose, Bld 156 (*)    BUN 29 (*)    Creatinine, Ser 1.72 (*)    GFR calc non Af Amer 25 (*)    GFR calc Af Amer 29 (*)    All other components within normal limits  CBC - Abnormal; Notable for the following components:   RBC 3.61 (*)    Hemoglobin 11.4 (*)    MCV 105.8 (*)    MCHC 29.8 (*)    All other components within normal limits  RESPIRATORY PANEL BY RT PCR (FLU A&B, COVID)  SARS CORONAVIRUS 2 (TAT 6-24 HRS)  BRAIN NATRIURETIC PEPTIDE  PROTIME-INR  TROPONIN I (HIGH SENSITIVITY)  TROPONIN I (HIGH SENSITIVITY)    EKG EKG Interpretation  Date/Time:  Thursday December 28 2019 17:07:03 EDT Ventricular Rate:  74 PR Interval:  256 QRS Duration: 186 QT Interval:  498 QTC Calculation: 552 R Axis:   -6 Text Interpretation: Atrial-paced rhythm with prolonged AV conduction Left bundle branch block Abnormal ECG No significant change since last tracing Confirmed by Isla Pence [phone]) on 12/28/2019 6:24:01 PM   Radiology DG Chest 2 View  Result Date: 12/28/2019 CLINICAL DATA:  Chest pain, history of CHF, PAF EXAM: CHEST - 2 VIEW COMPARISON:  Radiograph 11/26/2019 CT 05/22/2006 FINDINGS: Lung volumes are low with central vascular crowding suggests some atelectatic changes. Some hazy interstitial features are present as well with indistinct pulmonary vascularity and septal thickening. Obscuration of the right hemidiaphragm may reflect trace right pleural fluid. No left effusion or pneumothorax. Tortuosity of the tracheal air column is similar to priors. Cardiomegaly is similar to comparison exams cardiac leads at the right atrium and cardiac apex. The aorta is calcified. The remaining cardiomediastinal contours are unremarkable.  The osseous  structures appear diffusely demineralized which may limit detection of small or nondisplaced fractures. No acute osseous or soft tissue abnormality. Degenerative changes are present in the imaged spine and shoulders. Exaggerated thoracic kyphosis is noted. Finding similar to comparison. IMPRESSION: Features suggest CHF with cardiomegaly and pulmonary edema with possible trace right effusion. Additional atelectatic changes with low inspiration. Electronically Signed   By: Lovena Le M.D.   On: 12/28/2019 17:50    Procedures Procedures (including critical care time)  Medications Ordered in ED Medications  sodium chloride flush (NS) 0.9 % injection 3 mL (has no administration in time range)  furosemide (LASIX) injection 40 mg (has no administration in time range)    ED Course  I have reviewed the triage vital signs and the nursing notes.  Pertinent labs & imaging results that were available during my care of the patient were reviewed by me and considered in my medical decision making (see chart for details).    MDM Rules/Calculators/A&P                     Armonie Mettler is a 86 y.o. female is here with shortness of breath.  She has a EF of 20%.  Likely has CHF exacerbation.  Cardiology already tried to increase Lasix and now recommend IV diuresis.  Chart was reviewed and her BNP was elevated at 11,000 several days ago.  Will order IV Lasix and admit patient.   Additional history obtained:  Additional history obtained from daughter. Previous records obtained and reviewed   Lab Tests:  I Ordered, reviewed, and interpreted labs, which included:  CBC, CMP, PT/INR, BNP   Imaging Studies ordered:  I ordered imaging studies which included CXR, I independently visualized and interpreted imaging which showed heart failure  Medicines ordered:  I ordered medication lasix  For heart failure    7:51 PM Chest x-ray show heart failure.  Troponin is normal.  Initial blood pressure was soft in the  80s .  However repeat blood pressure is 120/73.  Will order Lasix 40 mg IV.  Hospitalist to admit for heart failure exacerbation.   Final Clinical Impression(s) / ED Diagnoses Final diagnoses:  None    Rx / DC Orders ED Discharge Orders    None       Drenda Freeze, MD 12/28/19 2003

## 2019-12-28 NOTE — Progress Notes (Signed)
ANTICOAGULATION CONSULT NOTE - Initial Consult  Pharmacy Consult for warfarin Indication: atrial fibrillation  No Known Allergies  Patient Measurements:    Vital Signs: Temp: 97.9 F (36.6 C) (04/15 1711) BP: 127/73 (04/15 1937) Pulse Rate: 78 (04/15 1937)  Labs: Recent Labs    12/28/19 1737 12/28/19 1943  HGB 11.4*  --   HCT 38.2  --   PLT 150  --   LABPROT  --  30.4*  INR  --  2.9*  CREATININE 1.72*  --   TROPONINIHS 10  --     CrCl cannot be calculated (Unknown ideal weight.).   Medical History: Past Medical History:  Diagnosis Date  . Acute on chronic diastolic (congestive) heart failure ([place]) 01/22/2019  . Acute on chronic systolic CHF (congestive heart failure) ([place]) 03/04/2018  . Anemia   . Arthritis    "knees especially"  . Bradycardia   . Cardiomyopathy    presumed ischemic in the past with an EF of 35%. The most recent EF in 03-2008, however, was 55 -60%  . CHF 12/28/2007   Qualifier: Diagnosis of  By: Laney Potash, [name]    . Chronic anticoagulation 12/15/2015  . Chronic renal insufficiency   . Dementia ([place])    mild  . Dyslipidemia   . Encounter for therapeutic drug monitoring 12/25/2013  . Glaucoma   . Hypertension   . Malleolar fracture   . Pacemaker infection ([place]) 08/18/2012  . Pacemaker- Medtronic 12/25/2011   DOI 1/13   . PAF (paroxysmal atrial fibrillation) ([place]) 04/19/2017  . Paroxysmal atrial fibrillation (HCC)   . Sinus node dysfunction ([place]) 12/28/2007   Qualifier: Diagnosis of  By: Laney Potash, [name]     . Tachycardia-bradycardia syndrome ([place]) 09/19/2011   Assessment: 72 YOF presenting with with SOB, on warfarin PTA for Afib, last dose 4/14.  INR therapeutic on admission  PTA dosing: 2mg  daily, except 1mg  on Sundays  Goal of Therapy:  INR 2-3 Monitor platelets by anticoagulation protocol: Yes   Plan:  Give warfarin 2mg  PO x 1 tonight Daily INR, s/s bleeding  Bertis Ruddy, PharmD Clinical Pharmacist ED Pharmacist Phone #  [phone] 12/28/2019 9:16 PM

## 2019-12-28 NOTE — Telephone Encounter (Signed)
Spoke with [employer].    Per Anne Ng Pt has seen minimal improvement on increase lasix, but still trouble breathing.  Discussed with Dr. Percival Spanish who advised hospitalization with IV lasix.  Audree Camel.  She indicates understanding and agreement.

## 2019-12-28 NOTE — ED Triage Notes (Signed)
Pt arrives to ED w/ c/o SOB. Pt sent by PCP. Pt hypotensive in ED. Denies chest pain.

## 2019-12-28 NOTE — Telephone Encounter (Signed)
Annette from [place] [facility] [place] calling stating the patient was seen by them 12/06/2019 and states the patient's BMP was 13617. She states they increased her lasix to 80mg  to 2x a day for a week, then went back to once a day.  On 12/25/2019 her BMP was 11255. She states the patient still had rails and JVD and some use of accessory muscles. They increased lasix to 80mg  twice a day again. Anne Ng states she has the patient back today for a follow up and the patient still has rails, JVD and some lower extremity edema. She states her oxygen is ranging between 93-94%, and BP 116/70 HR 72. She says the patient feels better and is breathing better. Anne Ng would like to know if Dr. Caryl Comes would like her to continue lasix twice a day and see her back next week or would he rather she go to the hospital and have IV lasix.

## 2019-12-28 NOTE — H&P (Signed)
History and Physical    [name] NID:[id number] DOB: March 01, 1923 DOA: 1/44/3154  PCP: Chriss Czar, MD  Patient coming from: Home  I have personally briefly reviewed patient's old medical records in [place]  Chief Complaint: Shortness of breath  HPI: [name] is a 86 y.o. female with medical history significant for chronic combined systolic and diastolic CHF (EF 00-86%), paroxysmal atrial fibrillation on Coumadin, tachybradycardia syndrome s/p PPM, CKD stage III, hypothyroidism, hypertension, and hyperlipidemia who presents to the ED for evaluation of progressive shortness of breath.  Patient has had about 2 weeks of intermittent shortness of breath.  3 weeks ago her Lasix was increased to 80 mg twice a day for a week and she was cut back to once a day.  Over the last several days she had continued shortness of breath and home health nurse noted rales on examination.  Lasix was increased again to 80 mg twice a day.  Her home nurse called the patient's cardiology office today due to continued shortness of breath and she was advised to present to the ED for further evaluation and management.  She currently denies any chest pain, palpitations, nausea, vomiting, abdominal pain, cough, or swelling in her legs.  ED Course:  Initial vitals showed BP 81/66, pulse 64, RR 18, temp 97.9 Fahrenheit, SPO2 97% on room air.  Repeat blood pressure improved to 127/73.  Labs are notable for sodium 142, potassium 3.3, bicarb 30, BUN 29, creatinine 1.72 (1.47 previously), WBC 7.1, hemoglobin 11.4, platelets 150,000, high-sensitivity troponin I 10.  SARS-CoV-2 respiratory PCR panel is collected and pending.  2 view chest x-ray shows left-sided pacemaker in place, cardiomegaly, pulmonary edema, possible trace right effusion.  Patient was given IV Lasix 40 mg once and the hospitalist service was consulted to me for further evaluation and management.  Review of Systems: All systems  reviewed and are negative except as documented in history of present illness above.   Past Medical History:  Diagnosis Date  . Acute on chronic diastolic (congestive) heart failure ([place]) 01/22/2019  . Acute on chronic systolic CHF (congestive heart failure) ([place]) 03/04/2018  . Anemia   . Arthritis    "knees especially"  . Bradycardia   . Cardiomyopathy    presumed ischemic in the past with an EF of 35%. The most recent EF in 03-2008, however, was 55 -60%  . CHF 12/28/2007   Qualifier: Diagnosis of  By: Laney Potash, [name]    . Chronic anticoagulation 12/15/2015  . Chronic renal insufficiency   . Dementia ([place])    mild  . Dyslipidemia   . Encounter for therapeutic drug monitoring 12/25/2013  . Glaucoma   . Hypertension   . Malleolar fracture   . Pacemaker infection ([place]) 08/18/2012  . Pacemaker- [employer] 12/25/2011   DOI 1/13   . PAF (paroxysmal atrial fibrillation) ([place]) 04/19/2017  . Paroxysmal atrial fibrillation (H[facility])   . Sinus node dysfunction ([place]) 12/28/2007   Qualifier: Diagnosis of  By: Laney Potash, [name]     . Tachycardia-bradycardia syndrome ([place]) 09/19/2011    Past Surgical History:  Procedure Laterality Date  . ABDOMINAL HYSTERECTOMY    . CARDIOVERSION  07/2011  . EYE SURGERY  2002   "blood clot behind eye"  . FRACTURE SURGERY  ~ 2007   RLE  . HEMORRHOID SURGERY    . INSERT / REPLACE / REMOVE PACEMAKER  09/17/11   initial placement  . INSERT / REPLACE / REMOVE PACEMAKER  09/18/11  . PACEMAKER REVISION  N/A 09/18/2011   Procedure: PACEMAKER REVISION;  Surgeon: Thompson Grayer, MD;  Location: [facility] CATH LAB;  Service: Cardiovascular;  Laterality: N/A;  . PERMANENT PACEMAKER INSERTION N/A 09/17/2011   Procedure: PERMANENT PACEMAKER INSERTION;  Surgeon: Deboraha Sprang, MD;  Location: [facility] CATH LAB;  Service: Cardiovascular;  Laterality: N/A;  . TONSILLECTOMY AND ADENOIDECTOMY      Social History:  reports that she has never smoked. She has never used smokeless tobacco. She reports that  she does not drink alcohol or use drugs.  No Known Allergies  Family History  Problem Relation Age of Onset  . Other Other        Noncontributory     Prior to Admission medications   Medication Sig Start Date End Date Taking? Authorizing Provider  acetaminophen (TYLENOL) 500 MG tablet Take 500 mg by mouth every 6 (six) hours as needed for headache (pain).   Yes [provider]  amiodarone (PACERONE) 200 MG tablet Take 1 tablet by mouth 5 days a week 06/15/19  Yes Minus Breeding, MD  atorvastatin (LIPITOR) 10 MG tablet Take 0.5 tablets (5 mg total) by mouth at bedtime. 09/13/19  Yes Minus Breeding, MD  brimonidine (ALPHAGAN) 0.2 % ophthalmic solution Place 1 drop into both eyes daily. 04/02/16  Yes [provider]  Famotidine (PEPCID AC MAXIMUM STRENGTH) 20 MG CHEW Chew 1 tablet by mouth daily.    Yes [provider]  furosemide (LASIX) 80 MG tablet TAKE 1 TABLET(80 MG) BY MOUTH DAILY Patient taking differently: Take 160 mg by mouth daily.  07/25/19  Yes Minus Breeding, MD  levothyroxine (SYNTHROID) 25 MCG tablet Take 1 tablet (25 mcg total) by mouth daily before breakfast. 02/16/19  Yes Deboraha Sprang, MD  lisinopril (ZESTRIL) 5 MG tablet TAKE 1 TABLET(5 MG) BY MOUTH DAILY Patient taking differently: Take 5 mg by mouth daily.  05/31/19  Yes Minus Breeding, MD  Melatonin 3 MG CAPS Take 3 mg by mouth at bedtime.    Yes [provider]  metoprolol tartrate (LOPRESSOR) 50 MG tablet TAKE 1 AND 1/2 TABLETS BY MOUTH TWICE DAILY Patient taking differently: Take 75 mg by mouth 2 (two) times daily.  06/19/19  Yes Deboraha Sprang, MD  mirtazapine (REMERON) 7.5 MG tablet Take 7.5 mg by mouth at bedtime. 09/17/19  Yes [provider]  Multiple Vitamin (MULTIVITAMIN WITH MINERALS) TABS tablet Take 1 tablet by mouth daily.   Yes [provider]  Olopatadine HCl (PATADAY) 0.2 % SOLN Place 1 drop into both eyes daily.    Yes [provider]    potassium chloride SA (KLOR-CON) 20 MEQ tablet TAKE 1 TABLET BY MOUTH EVERY DAY Patient taking differently: Take 20 mEq by mouth daily.  12/11/19  Yes Minus Breeding, MD  senna (SENOKOT) 8.6 MG TABS tablet Take 1 tablet by mouth at bedtime as needed for mild constipation.   Yes [provider]  Travoprost, BAK Free, (TRAVATAN) 0.004 % SOLN ophthalmic solution Place 1 drop into both eyes at bedtime.   Yes [provider]  warfarin (COUMADIN) 2 MG tablet TAKE 1 TABLET BY MOUTH DAILY OR AS DIRECTED BY COUMADIN CLINIC Patient taking differently: Take 2 mg by mouth See admin instructions. TAKE 1 TABLET BY MOUTH DAILY OR AS DIRECTED BY COUMADIN CLINIC. 1mg  on Sundays 07/24/19  Yes Minus Breeding, MD    Physical Exam: Vitals:   12/28/19 1711 12/28/19 1937  BP: (!) 81/66 127/73  Pulse: 64 78  Resp: 18 14  Temp: 97.9 F (36.6 C)   SpO2: 97% 99%   Constitutional: Elderly woman resting supine in bed, NAD, calm, comfortable Eyes: PERRL, lids and conjunctivae normal ENMT: Mucous membranes are moist. Posterior pharynx clear of any exudate or lesions. Neck: normal, supple, no masses. Respiratory: Expiratory crackles diffuse lung fields.  Normal respiratory effort. No accessory muscle use.  Cardiovascular: Regular rate and rhythm, no murmurs / rubs / gallops. No extremity edema. 2+ pedal pulses. Abdomen: no tenderness, no masses palpated. No hepatosplenomegaly. Bowel sounds positive.  Musculoskeletal: no clubbing / cyanosis. No joint deformity upper and lower extremities. Good ROM, no contractures. Normal muscle tone.  Skin: no rashes, lesions, ulcers. No induration Neurologic: CN 2-12 grossly intact. Sensation intact, Strength 5/5 in all 4.  Psychiatric: Normal judgment and insight. Alert and oriented x 3. Normal mood.   Labs on Admission: I have personally reviewed following labs and imaging studies  CBC: Recent Labs  Lab 12/28/19 1737  WBC 7.1  HGB 11.4*  HCT 38.2  MCV  105.8*  PLT 242   Basic Metabolic Panel: Recent Labs  Lab 12/28/19 1737  NA 142  K 3.3*  CL 101  CO2 30  GLUCOSE 156*  BUN 29*  CREATININE 1.72*  CALCIUM 9.2   GFR: CrCl cannot be calculated (Unknown ideal weight.). Liver Function Tests: No results for input(s): AST, ALT, ALKPHOS, BILITOT, PROT, ALBUMIN in the last 168 hours. No results for input(s): LIPASE, AMYLASE in the last 168 hours. No results for input(s): AMMONIA in the last 168 hours. Coagulation Profile: No results for input(s): INR, PROTIME in the last 168 hours. Cardiac Enzymes: No results for input(s): CKTOTAL, CKMB, CKMBINDEX, TROPONINI in the last 168 hours. BNP (last 3 results) No results for input(s): PROBNP in the last 8760 hours. HbA1C: No results for input(s): HGBA1C in the last 72 hours. CBG: No results for input(s): GLUCAP in the last 168 hours. Lipid Profile: No results for input(s): CHOL, HDL, LDLCALC, TRIG, CHOLHDL, LDLDIRECT in the last 72 hours. Thyroid Function Tests: No results for input(s): TSH, T4TOTAL, FREET4, T3FREE, THYROIDAB in the last 72 hours. Anemia Panel: No results for input(s): VITAMINB12, FOLATE, FERRITIN, TIBC, IRON, RETICCTPCT in the last 72 hours. Urine analysis:    Component Value Date/Time   COLORURINE YELLOW 01/21/2010 [place] 01/21/2010 1424   LABSPEC 1.013 01/21/2010 1424   PHURINE 5.5 01/21/2010 1424   GLUCOSEU NEGATIVE 01/21/2010 [place] 01/21/2010 [place] 01/21/2010 1424   [place] 01/21/2010 1424   PROTEINUR NEGATIVE 01/21/2010 1424   UROBILINOGEN 0.2 01/21/2010 1424   NITRITE NEGATIVE 01/21/2010 1424   [place] 01/21/2010 1424    Radiological Exams on Admission: DG Chest 2 View  Result Date: 12/28/2019 CLINICAL DATA:  Chest pain, history of CHF, PAF EXAM: CHEST - 2 VIEW COMPARISON:  Radiograph 11/26/2019 CT 05/22/2006 FINDINGS: Lung volumes are low with central vascular crowding  suggests some atelectatic changes. Some hazy interstitial features are present as well with indistinct pulmonary vascularity and septal thickening. Obscuration of the right hemidiaphragm may reflect trace right pleural fluid. No left effusion or pneumothorax. Tortuosity of the tracheal air column is similar to priors. Cardiomegaly is similar to comparison exams cardiac leads at the right atrium and cardiac apex. The aorta is calcified. The remaining cardiomediastinal contours are unremarkable. The osseous structures appear diffusely demineralized which may limit detection of small or nondisplaced fractures. No acute osseous or soft tissue abnormality. Degenerative changes are present  in the imaged spine and shoulders. Exaggerated thoracic kyphosis is noted. Finding similar to comparison. IMPRESSION: Features suggest CHF with cardiomegaly and pulmonary edema with possible trace right effusion. Additional atelectatic changes with low inspiration. Electronically Signed   By: Lovena Le M.D.   On: 12/28/2019 17:50    EKG: Independently reviewed.  Atrial paced rhythm, previous EKG showed AV paced rhythm.  Assessment/Plan Principal Problem:   Acute on chronic combined systolic and diastolic congestive heart failure (HCC) Active Problems:   Dyslipidemia   Hypertension   PAF (paroxysmal atrial fibrillation) (HCC)   Hypothyroidism   Acute kidney injury superimposed on CKD (HCC)  [name] is a 86 y.o. female with medical history significant for chronic combined systolic and diastolic CHF (EF 27-78%), paroxysmal atrial fibrillation on Coumadin, tachybradycardia syndrome s/p PPM, CKD stage III, hypothyroidism, hypertension, and hyperlipidemia who is admitted with acute on chronic combined systolic and diastolic CHF exacerbation.   Acute on chronic combined systolic and diastolic CHF exacerbation: With associated pulmonary edema.  Symptoms beginning to improve with initial IV Lasix. -Continue IV Lasix  40 mg twice daily as BP allows -Home lisinopril, Lopressor currently on hold due to soft blood pressure -Monitor strict I/O's and daily weights  AKI on CKD stage III: Likely due to acute CHF.  Expect this to improve with diuresis.  Will repeat labs in a.m.  Hold lisinopril at this time.  Hypokalemia: Replete orally.  Check magnesium and replete if needed.  Paroxysmal atrial fibrillation Tachybradycardia syndrome s/p PPM: In paced rhythm with controlled rate on admission.  Continue amiodarone and Coumadin per pharmacy.  Holding metoprolol due to hypotension.  Hypertension: Holding home lisinopril and metoprolol due to hypotension.  Hypothyroidism: Continue Synthroid.  Hyperlipidemia: Continue rosuvastatin.  DVT prophylaxis: Coumadin Code Status: Full code, confirmed with patient Family Communication: Discussed with daughter [name] by phone [phone]. Disposition Plan: From home, likely discharge to home pending symptomatic improvement. Consults called: None Admission status:  Status is: Observation  The patient remains OBS appropriate and will d/c before 2 midnights.  Dispo: The patient is from: Home              Anticipated d/c is to: Home              Anticipated d/c date is: 1 day              Patient currently is not medically stable to d/c.   Zada Finders MD [facility]  If 7PM-7AM, please contact night-coverage [email or web address]  12/28/2019, 8:43 PM

## 2019-12-28 NOTE — ED Notes (Signed)
[name] (Daughter#[phone]) called/would like to visit once patient place in room.

## 2019-12-28 NOTE — ED Notes (Signed)
Family at bedside. 

## 2019-12-29 ENCOUNTER — Inpatient Hospital Stay (HOSPITAL_COMMUNITY): Payer: Medicare Other

## 2019-12-29 DIAGNOSIS — I509 Heart failure, unspecified: Secondary | ICD-10-CM

## 2019-12-29 DIAGNOSIS — I5043 Acute on chronic combined systolic (congestive) and diastolic (congestive) heart failure: Secondary | ICD-10-CM

## 2019-12-29 DIAGNOSIS — E785 Hyperlipidemia, unspecified: Secondary | ICD-10-CM | POA: Diagnosis present

## 2019-12-29 DIAGNOSIS — I429 Cardiomyopathy, unspecified: Secondary | ICD-10-CM | POA: Diagnosis present

## 2019-12-29 DIAGNOSIS — N179 Acute kidney failure, unspecified: Secondary | ICD-10-CM | POA: Diagnosis present

## 2019-12-29 DIAGNOSIS — I5084 End stage heart failure: Secondary | ICD-10-CM | POA: Diagnosis present

## 2019-12-29 DIAGNOSIS — Z9981 Dependence on supplemental oxygen: Secondary | ICD-10-CM | POA: Diagnosis not present

## 2019-12-29 DIAGNOSIS — D631 Anemia in chronic kidney disease: Secondary | ICD-10-CM | POA: Diagnosis present

## 2019-12-29 DIAGNOSIS — N189 Chronic kidney disease, unspecified: Secondary | ICD-10-CM

## 2019-12-29 DIAGNOSIS — I480 Paroxysmal atrial fibrillation: Secondary | ICD-10-CM

## 2019-12-29 DIAGNOSIS — E039 Hypothyroidism, unspecified: Secondary | ICD-10-CM | POA: Diagnosis present

## 2019-12-29 DIAGNOSIS — Z7901 Long term (current) use of anticoagulants: Secondary | ICD-10-CM | POA: Diagnosis not present

## 2019-12-29 DIAGNOSIS — E876 Hypokalemia: Secondary | ICD-10-CM

## 2019-12-29 DIAGNOSIS — R0602 Shortness of breath: Secondary | ICD-10-CM | POA: Diagnosis present

## 2019-12-29 DIAGNOSIS — Z20822 Contact with and (suspected) exposure to covid-19: Secondary | ICD-10-CM | POA: Diagnosis present

## 2019-12-29 DIAGNOSIS — H409 Unspecified glaucoma: Secondary | ICD-10-CM | POA: Diagnosis present

## 2019-12-29 DIAGNOSIS — Z90710 Acquired absence of both cervix and uterus: Secondary | ICD-10-CM | POA: Diagnosis not present

## 2019-12-29 DIAGNOSIS — I959 Hypotension, unspecified: Secondary | ICD-10-CM | POA: Diagnosis present

## 2019-12-29 DIAGNOSIS — Z950 Presence of cardiac pacemaker: Secondary | ICD-10-CM | POA: Diagnosis not present

## 2019-12-29 DIAGNOSIS — N1832 Chronic kidney disease, stage 3b: Secondary | ICD-10-CM | POA: Diagnosis present

## 2019-12-29 DIAGNOSIS — I5023 Acute on chronic systolic (congestive) heart failure: Secondary | ICD-10-CM | POA: Diagnosis not present

## 2019-12-29 DIAGNOSIS — Z9089 Acquired absence of other organs: Secondary | ICD-10-CM | POA: Diagnosis not present

## 2019-12-29 DIAGNOSIS — I495 Sick sinus syndrome: Secondary | ICD-10-CM | POA: Diagnosis present

## 2019-12-29 DIAGNOSIS — J9602 Acute respiratory failure with hypercapnia: Secondary | ICD-10-CM | POA: Diagnosis not present

## 2019-12-29 DIAGNOSIS — I130 Hypertensive heart and chronic kidney disease with heart failure and stage 1 through stage 4 chronic kidney disease, or unspecified chronic kidney disease: Secondary | ICD-10-CM | POA: Diagnosis present

## 2019-12-29 DIAGNOSIS — J9601 Acute respiratory failure with hypoxia: Secondary | ICD-10-CM | POA: Diagnosis not present

## 2019-12-29 DIAGNOSIS — I10 Essential (primary) hypertension: Secondary | ICD-10-CM | POA: Diagnosis not present

## 2019-12-29 DIAGNOSIS — H9190 Unspecified hearing loss, unspecified ear: Secondary | ICD-10-CM | POA: Diagnosis present

## 2019-12-29 DIAGNOSIS — F0390 Unspecified dementia without behavioral disturbance: Secondary | ICD-10-CM | POA: Diagnosis present

## 2019-12-29 DIAGNOSIS — Z7189 Other specified counseling: Secondary | ICD-10-CM | POA: Diagnosis not present

## 2019-12-29 LAB — PROTIME-INR
INR: 3 — ABNORMAL HIGH (ref 0.8–1.2)
Prothrombin Time: 31.1 seconds — ABNORMAL HIGH (ref 11.4–15.2)

## 2019-12-29 LAB — BASIC METABOLIC PANEL
Anion gap: 12 (ref 5–15)
BUN: 27 mg/dL — ABNORMAL HIGH (ref 8–23)
CO2: 30 mmol/L (ref 22–32)
Calcium: 8.7 mg/dL — ABNORMAL LOW (ref 8.9–10.3)
Chloride: 105 mmol/L (ref 98–111)
Creatinine, Ser: 1.5 mg/dL — ABNORMAL HIGH (ref 0.44–1.00)
GFR calc Af Amer: 34 mL/min — ABNORMAL LOW (ref >60)
GFR calc non Af Amer: 30 mL/min — ABNORMAL LOW (ref >60)
Glucose, Bld: 107 mg/dL — ABNORMAL HIGH (ref 70–99)
Potassium: 3.4 mmol/L — ABNORMAL LOW (ref 3.5–5.1)
Sodium: 147 mmol/L — ABNORMAL HIGH (ref 135–145)

## 2019-12-29 LAB — MAGNESIUM
Magnesium: 2 mg/dL (ref 1.7–2.4)
Magnesium: 2.1 mg/dL (ref 1.7–2.4)

## 2019-12-29 LAB — CBC
HCT: 35.5 % — ABNORMAL LOW (ref 36.0–46.0)
Hemoglobin: 11.1 g/dL — ABNORMAL LOW (ref 12.0–15.0)
MCH: 32.8 pg (ref 26.0–34.0)
MCHC: 31.3 g/dL (ref 30.0–36.0)
MCV: 105 fL — ABNORMAL HIGH (ref 80.0–100.0)
Platelets: 144 10*3/uL — ABNORMAL LOW (ref 150–400)
RBC: 3.38 MIL/uL — ABNORMAL LOW (ref 3.87–5.11)
RDW: 13.2 % (ref 11.5–15.5)
WBC: 6.2 10*3/uL (ref 4.0–10.5)
nRBC: 0 % (ref 0.0–0.2)

## 2019-12-29 MED ORDER — POTASSIUM CHLORIDE 20 MEQ/15ML (10%) PO SOLN
40.0000 meq | Freq: Two times a day (BID) | ORAL | Status: DC
  Administered 2019-12-29 (×2): 40 meq via ORAL
  Filled 2019-12-29 (×3): qty 30

## 2019-12-29 MED ORDER — WARFARIN SODIUM 1 MG PO TABS
1.0000 mg | ORAL_TABLET | Freq: Once | ORAL | Status: AC
  Administered 2019-12-29: 1 mg via ORAL
  Filled 2019-12-29: qty 1

## 2019-12-29 MED ORDER — IPRATROPIUM-ALBUTEROL 0.5-2.5 (3) MG/3ML IN SOLN: 3.0000 mL | Freq: Four times a day (QID) | RESPIRATORY_TRACT | Status: DC | PRN

## 2019-12-29 MED ORDER — FAMOTIDINE 20 MG PO TABS
20.0000 mg | ORAL_TABLET | Freq: Every day | ORAL | Status: DC
  Administered 2019-12-29 – 2020-01-02 (×5): 20 mg via ORAL
  Filled 2019-12-29 (×5): qty 1

## 2019-12-29 MED ORDER — AMIODARONE HCL 200 MG PO TABS
200.0000 mg | ORAL_TABLET | Freq: Every day | ORAL | Status: DC
  Administered 2019-12-29 – 2020-01-01 (×4): 200 mg via ORAL
  Filled 2019-12-29 (×4): qty 1

## 2019-12-29 MED ORDER — POTASSIUM CHLORIDE 20 MEQ/15ML (10%) PO SOLN: 40.0000 meq | Freq: Two times a day (BID) | ORAL | Status: DC

## 2019-12-29 MED ORDER — ATORVASTATIN CALCIUM 10 MG PO TABS
5.0000 mg | ORAL_TABLET | Freq: Every day | ORAL | Status: DC
  Administered 2019-12-29 – 2020-01-01 (×5): 5 mg via ORAL
  Filled 2019-12-29 (×5): qty 1

## 2019-12-29 MED ORDER — LEVOTHYROXINE SODIUM 25 MCG PO TABS
25.0000 ug | ORAL_TABLET | Freq: Every day | ORAL | Status: DC
  Administered 2019-12-29 – 2020-01-02 (×5): 25 ug via ORAL
  Filled 2019-12-29 (×5): qty 1

## 2019-12-29 NOTE — Progress Notes (Signed)
Patient would like her home eyedrops and mirtazapine to be added to the [facility]. Please advise.

## 2019-12-29 NOTE — Progress Notes (Signed)
Patient called to nurses station complaining of "not being able to breath". RN went to room and patient has been on RA, O2 sats were 83%, RN placed O2 at 4L and O2 sats went up to 89-90%. Patient was very anxious, diaphoretic and would not sit still. RN helped patient to calm down and RN then placed NRB-O2 sats 99-100%. Respiratory called and provider notified. Lung sounds- wheezing and some crackles. Orders placed. Patient says she feels better with the NRB mask, continuous pulse ox placed. Will continue to monitor patient.

## 2019-12-29 NOTE — ED Notes (Signed)
Tele   bfast ordered  

## 2019-12-29 NOTE — ED Notes (Signed)
Report given to [facility] RN

## 2019-12-29 NOTE — Evaluation (Signed)
Physical Therapy Evaluation Patient Details Name: [name] MRN: [id number] DOB: [birth year] Today's Date: 12/29/2019   History of Present Illness  86yo female c/o progressive SOB x2 weeks, ultimately referred to ED by [facility] RN and OP Cardiology. Covid negative. Admitted due to acute on chronic CHF exacerbation. PMH CHF, cardiomyopathy, chronic anticoagulation, renal insufficiency, dementia, HTN, dyslipidemia, hx ankle fracture, tachy-brady syndrome s/p PPM and pace maker revision  Clinical Impression   Patient received in bed, very HOH (hears slightly better out of R ear) but very pleasant and willing to participate in PT. Able to complete mobility with assist as below. Moving very well, min guard-S only provided for safety due to new environment and lines today. Tolerated gait training approximately 127f with VC for safe distance from RW on RA with VSS. Left in bed with all needs met this morning. Do not feel she is in need of PT following DC from the hospital, but will continue to benefit from 24/7 assist at home.     Follow Up Recommendations No PT follow up;Supervision/Assistance - 24 hour    Equipment Recommendations  None recommended by PT(already has necessary DME at home)    Recommendations for Other Services       Precautions / Restrictions Precautions Precautions: Fall Restrictions Weight Bearing Restrictions: No      Mobility  Bed Mobility Overal bed mobility: Modified Independent             General bed mobility comments: HOB elevated, extended time and use of bed rails  Transfers Overall transfer level: Needs assistance Equipment used: Rolling walker (2 wheeled) Transfers: Sit to/from Stand Sit to Stand: Min guard         General transfer comment: min guard for safety given very high stretcher, no physical assist given  Ambulation/Gait Ambulation/Gait assistance: Supervision Gait Distance (Feet): 120 Feet Assistive device: Rolling walker (2  wheeled) Gait Pattern/deviations: Step-through pattern;Trunk flexed Gait velocity: decreased   General Gait Details: slow and steady at self-selected pace with RW, VSS on RA  Stairs            Wheelchair Mobility    Modified Rankin (Stroke Patients Only)       Balance Overall balance assessment: Mild deficits observed, not formally tested                                           Pertinent Vitals/Pain Pain Assessment: No/denies pain    Home Living Family/patient expects to be discharged to:: Private residence Living Arrangements: Other (Comment)(PCAs and family provide care 24/7) Available Help at Discharge: Family;Personal care attendant;Available 24 hours/day Type of Home: Apartment Home Access: Level entry     Home Layout: One level Home Equipment: Walker - 4 wheels;Walker - 2 wheels;Cane - single point;Bedside commode Additional Comments: reports she is never home alone at this point between her PCAs and family    Prior Function Level of Independence: Needs assistance   Gait / Transfers Assistance Needed: able to walk with RW, reports she always has someone with her walking but very unclear about how much assist she needs  ADL's / Homemaking Assistance Needed: aide assists with bathing, cooking, son does grocery shopping  Comments: mostly using RW now     Hand Dominance   Dominant Hand: Right    Extremity/Trunk Assessment   Upper Extremity Assessment Upper Extremity Assessment: Generalized weakness  Lower Extremity Assessment Lower Extremity Assessment: Generalized weakness    Cervical / Trunk Assessment Cervical / Trunk Assessment: Kyphotic  Communication   Communication: HOH  Cognition Arousal/Alertness: Awake/alert Behavior During Therapy: WFL for tasks assessed/performed Overall Cognitive Status: Within Functional Limits for tasks assessed                                 General Comments: mild dementia  at baseline but functionally intact and appropriate with PT today      General Comments      Exercises     Assessment/Plan    PT Assessment Patient needs continued PT services  PT Problem List Decreased strength;Decreased balance;Decreased mobility;Decreased coordination;Decreased knowledge of precautions       PT Treatment Interventions DME instruction;Gait training;Balance training;Stair training;Patient/family education;Functional mobility training;Therapeutic activities;Therapeutic exercise    PT Goals (Current goals can be found in the Care Plan section)  Acute Rehab PT Goals Patient Stated Goal: feel better, go home PT Goal Formulation: With patient Time For Goal Achievement: 01/12/20 Potential to Achieve Goals: Good    Frequency Min 3X/week   Barriers to discharge        Co-evaluation               AM-PAC PT "6 Clicks" Mobility  Outcome Measure Help needed turning from your back to your side while in a flat bed without using bedrails?: A Little Help needed moving from lying on your back to sitting on the side of a flat bed without using bedrails?: A Little Help needed moving to and from a bed to a chair (including a wheelchair)?: A Little Help needed standing up from a chair using your arms (e.g., wheelchair or bedside chair)?: A Little Help needed to walk in hospital room?: A Little Help needed climbing 3-5 steps with a railing? : A Little 6 Click Score: 18    End of Session Equipment Utilized During Treatment: Gait belt Activity Tolerance: Patient tolerated treatment well Patient left: in bed;with call bell/phone within reach   PT Visit Diagnosis: Muscle weakness (generalized) (M62.81)    Time: 0623-7628 PT Time Calculation (min) (ACUTE ONLY): 23 min   Charges:   PT Evaluation $PT Eval Low Complexity: 1 Low PT Treatments $Gait Training: 8-22 mins        Windell Norfolk, DPT, PN1   Supplemental Physical Therapist [place]    Pager  [phone] Acute Rehab Office [phone]

## 2019-12-29 NOTE — Progress Notes (Addendum)
PROGRESS NOTE  [name] NLZ:[id number] DOB: Apr 06, 1923 DOA: 05/16/4096 PCP: Chriss Czar, MD  HPI/Recap of past 24 hours:  Hard of hearing, denies chest pain  Assessment/Plan: Principal Problem:   Acute on chronic combined systolic and diastolic congestive heart failure (HCC) Active Problems:   Dyslipidemia   Hypertension   PAF (paroxysmal atrial fibrillation) (HCC)   Hypothyroidism   Acute kidney injury superimposed on CKD ([place])  Acute on chronic combined chf with hypotension EF 20% Failed outpatient lasix therapy Currently on iv lasix  Cardiology consulted, input appreciated, will follow recommendation  Paroxysmal atrial fibrillation/Tachybradycardia syndrome s/p PPM: In paced rhythm with controlled rate on admission.  Continue amiodarone and Coumadin per pharmacy.  Holding metoprolol due to hypotension.  HTN Now hypotensive hold Lopressor lisinopril  CKD 3b/anemia of chronic disease hgb  11 Monitor renal function while on Lasix and having hypotension Hold lisinopril  Macrocytosis, mcv 105,  Will check b12/folate  Hypokalemia, replace, recheck in the morning  Hypothyroidism: Continue Synthroid.  Hyperlipidemia: Continue rosuvastatin.  Body mass index is 23.06 kg/m.   DVT Prophylaxis: Coumadin  Code Status: full  Family Communication: patient   Disposition Plan:    Patient came from:          home alone                                                                                                Anticipated d/c place: home   Barriers to d/c OR conditions which need to be met to effect a safe d/c: needs cardiology clearance,    Consultants:  cardiology  Procedures:  none  Antibiotics:  none   Objective: BP 109/65 (BP Location: Right Arm)   Pulse 61   Temp 97.9 F (36.6 C) (Oral)   Resp 18   Ht '5\' 3"'  (1.6 m)   Wt 54 kg   SpO2 98%   BMI 21.08 kg/m   Intake/Output Summary (Last 24 hours) at 12/29/2019 1259 Last data  filed at 12/29/2019 3532 Gross per 24 hour  Intake --  Output 900 ml  Net -900 ml   Filed Weights   12/29/19 0030  Weight: 54 kg    Exam: Patient is examined daily including today on 12/29/2019, exams remain the same as of yesterday except that has changed    General:  Alert, awake , pleasant, hard of hearing  Cardiovascular: Paced rhythm  Respiratory: Crackles in the bases  Abdomen: Soft/ND/NT, positive BS  Musculoskeletal: No Edema  Neuro: alert, oriented   Data Reviewed: Basic Metabolic Panel: Recent Labs  Lab 12/28/19 1737 12/28/19 2101 12/29/19 0551  NA 142  --  147*  K 3.3*  --  3.4*  CL 101  --  105  CO2 30  --  30  GLUCOSE 156*  --  107*  BUN 29*  --  27*  CREATININE 1.72*  --  1.50*  CALCIUM 9.2  --  8.7*  MG  --  2.0 2.1   Liver Function Tests: No results for input(s): AST, ALT, ALKPHOS, BILITOT, PROT, ALBUMIN in the last 168 hours. No results  for input(s): LIPASE, AMYLASE in the last 168 hours. No results for input(s): AMMONIA in the last 168 hours. CBC: Recent Labs  Lab 12/28/19 1737 12/29/19 0551  WBC 7.1 6.2  HGB 11.4* 11.1*  HCT 38.2 35.5*  MCV 105.8* 105.0*  PLT 150 144*   Cardiac Enzymes:   No results for input(s): CKTOTAL, CKMB, CKMBINDEX, TROPONINI in the last 168 hours. BNP (last 3 results) Recent Labs    01/20/19 1407 12/28/19 1938  BNP 2,283.5* 2,019.9*    ProBNP (last 3 results) No results for input(s): PROBNP in the last 8760 hours.  CBG: No results for input(s): GLUCAP in the last 168 hours.  Recent Results (from the past 240 hour(s))  Respiratory Panel by RT PCR (Flu A&B, Covid) - Nasopharyngeal Swab     Status: None   Collection Time: 12/28/19  7:57 PM   Specimen: Nasopharyngeal Swab  Result Value Ref Range Status   SARS Coronavirus 2 by RT PCR NEGATIVE NEGATIVE Final    Comment: (NOTE) SARS-CoV-2 target nucleic acids are NOT DETECTED. The SARS-CoV-2 RNA is generally detectable in upper respiratoy specimens  during the acute phase of infection. The lowest concentration of SARS-CoV-2 viral copies this assay can detect is 131 copies/mL. A negative result does not preclude SARS-Cov-2 infection and should not be used as the sole basis for treatment or other patient management decisions. A negative result may occur with  improper specimen collection/handling, submission of specimen other than nasopharyngeal swab, presence of viral mutation(s) within the areas targeted by this assay, and inadequate number of viral copies (<131 copies/mL). A negative result must be combined with clinical observations, patient history, and epidemiological information. The expected result is Negative. Fact Sheet for Patients:  PinkCheek.be Fact Sheet for Healthcare Providers:  GravelBags.it This test is not yet ap proved or cleared by the [place] FDA and  has been authorized for detection and/or diagnosis of SARS-CoV-2 by FDA under an Emergency Use Authorization (EUA). This EUA will remain  in effect (meaning this test can be used) for the duration of the COVID-19 declaration under Section 564(b)(1) of the Act, 21 U.S.C. section 360bbb-3(b)(1), unless the authorization is terminated or revoked sooner.    Influenza A by PCR NEGATIVE NEGATIVE Final   Influenza B by PCR NEGATIVE NEGATIVE Final    Comment: (NOTE) The Xpert Xpress SARS-CoV-2/FLU/RSV assay is intended as an aid in  the diagnosis of influenza from Nasopharyngeal swab specimens and  should not be used as a sole basis for treatment. Nasal washings and  aspirates are unacceptable for Xpert Xpress SARS-CoV-2/FLU/RSV  testing. Fact Sheet for Patients: PinkCheek.be Fact Sheet for Healthcare Providers: GravelBags.it This test is not yet approved or cleared by the [place] FDA and  has been authorized for detection and/or diagnosis of  SARS-CoV-2 by  FDA under an Emergency Use Authorization (EUA). This EUA will remain  in effect (meaning this test can be used) for the duration of the  Covid-19 declaration under Section 564(b)(1) of the Act, 21  U.S.C. section 360bbb-3(b)(1), unless the authorization is  terminated or revoked. Performed at [facility], [place] [address] [place], [place] [address]      Studies: DG Chest 2 View  Result Date: 12/28/2019 CLINICAL DATA:  Chest pain, history of CHF, PAF EXAM: CHEST - 2 VIEW COMPARISON:  Radiograph 11/26/2019 CT 05/22/2006 FINDINGS: Lung volumes are low with central vascular crowding suggests some atelectatic changes. Some hazy interstitial features are present as well with indistinct pulmonary  vascularity and septal thickening. Obscuration of the right hemidiaphragm may reflect trace right pleural fluid. No left effusion or pneumothorax. Tortuosity of the tracheal air column is similar to priors. Cardiomegaly is similar to comparison exams cardiac leads at the right atrium and cardiac apex. The aorta is calcified. The remaining cardiomediastinal contours are unremarkable. The osseous structures appear diffusely demineralized which may limit detection of small or nondisplaced fractures. No acute osseous or soft tissue abnormality. Degenerative changes are present in the imaged spine and shoulders. Exaggerated thoracic kyphosis is noted. Finding similar to comparison. IMPRESSION: Features suggest CHF with cardiomegaly and pulmonary edema with possible trace right effusion. Additional atelectatic changes with low inspiration. Electronically Signed   By: Lovena Le M.D.   On: 12/28/2019 17:50    Scheduled Meds: . amiodarone  200 mg Oral Daily  . atorvastatin  5 mg Oral QHS  . famotidine  20 mg Oral Daily  . furosemide  40 mg Intravenous Q12H  . levothyroxine  25 mcg Oral QAC breakfast  . potassium chloride  40 mEq Oral BID  . sodium chloride flush  3 mL Intravenous Q12H  .  Warfarin - Pharmacist Dosing Inpatient   Does not apply q1600    Continuous Infusions: . sodium chloride       Time spent: 43mns, case discussed with cardiology I have personally reviewed and interpreted on  12/29/2019 daily labs, tele strips, imagings as discussed above under date review session and assessment and plans.  I reviewed all nursing notes, pharmacy notes, consultant notes,  vitals, pertinent old records  I have discussed plan of care as described above with RN , patient on 12/29/2019   FFlorencia ReasonsMD, PhD, FACP  [facility]  Available via Epic secure chat 7am-7pm for nonurgent issues Please page for urgent issues, pager number available through [email or web address] .   12/29/2019, 12:59 PM  LOS: 0 days

## 2019-12-29 NOTE — ED Notes (Signed)
Lunch Tray Ordered @ 1047. 

## 2019-12-29 NOTE — Plan of Care (Signed)
  Problem: Education: Goal: Ability to demonstrate management of disease process will improve Outcome: Progressing Goal: Ability to verbalize understanding of medication therapies will improve Outcome: Progressing Goal: Individualized Educational Video(s) Outcome: Progressing   Problem: Activity: Goal: Capacity to carry out activities will improve Outcome: Progressing   Problem: Cardiac: Goal: Ability to achieve and maintain adequate cardiopulmonary perfusion will improve Outcome: Progressing   Problem: Education: Goal: Ability to demonstrate management of disease process will improve 12/29/2019 1533 by Arlina Robes, RN Outcome: Progressing 12/29/2019 1532 by Arlina Robes, RN Outcome: Progressing Goal: Ability to verbalize understanding of medication therapies will improve 12/29/2019 1533 by Arlina Robes, RN Outcome: Progressing 12/29/2019 1532 by Arlina Robes, RN Outcome: Progressing Goal: Individualized Educational Video(s) 12/29/2019 1533 by Arlina Robes, RN Outcome: Progressing 12/29/2019 1532 by Arlina Robes, RN Outcome: Progressing   Problem: Activity: Goal: Capacity to carry out activities will improve 12/29/2019 1533 by Arlina Robes, RN Outcome: Progressing 12/29/2019 1532 by Arlina Robes, RN Outcome: Progressing   Problem: Cardiac: Goal: Ability to achieve and maintain adequate cardiopulmonary perfusion will improve 12/29/2019 1533 by Arlina Robes, RN Outcome: Progressing 12/29/2019 1532 by Arlina Robes, RN Outcome: Progressing   Problem: Education: Goal: Knowledge of General Education information will improve Description: Including pain rating scale, medication(s)/side effects and non-pharmacologic comfort measures Outcome: Progressing   Problem: Health Behavior/Discharge Planning: Goal: Ability to manage health-related needs will improve Outcome: Progressing   Problem: Clinical Measurements: Goal:  Ability to maintain clinical measurements within normal limits will improve Outcome: Progressing Goal: Will remain free from infection Outcome: Progressing Goal: Diagnostic test results will improve Outcome: Progressing Goal: Respiratory complications will improve Outcome: Progressing Goal: Cardiovascular complication will be avoided Outcome: Progressing   Problem: Activity: Goal: Risk for activity intolerance will decrease Outcome: Progressing   Problem: Nutrition: Goal: Adequate nutrition will be maintained Outcome: Progressing   Problem: Coping: Goal: Level of anxiety will decrease Outcome: Progressing   Problem: Elimination: Goal: Will not experience complications related to bowel motility Outcome: Progressing Goal: Will not experience complications related to urinary retention Outcome: Progressing   Problem: Pain Managment: Goal: General experience of comfort will improve Outcome: Progressing   Problem: Safety: Goal: Ability to remain free from injury will improve Outcome: Progressing   Problem: Skin Integrity: Goal: Risk for impaired skin integrity will decrease Outcome: Progressing

## 2019-12-29 NOTE — Plan of Care (Signed)
  Problem: Education: Goal: Ability to demonstrate management of disease process will improve Outcome: Progressing   Problem: Education: Goal: Ability to verbalize understanding of medication therapies will improve Outcome: Progressing   Problem: Clinical Measurements: Goal: Respiratory complications will improve Outcome: Progressing   Problem: Activity: Goal: Risk for activity intolerance will decrease Outcome: Progressing   Problem: Coping: Goal: Level of anxiety will decrease Outcome: Progressing

## 2019-12-29 NOTE — Progress Notes (Signed)
ANTICOAGULATION CONSULT NOTE - Initial Consult  Pharmacy Consult for warfarin Indication: atrial fibrillation  No Known Allergies  Patient Measurements: Height: 5\' 3"  (160 cm) Weight: 54 kg (119 lb) IBW/kg (Calculated) : 52.4  Vital Signs: Temp: 97.9 F (36.6 C) (04/16 0030) Temp Source: Oral (04/16 0030) BP: 122/68 (04/16 0800) Pulse Rate: 64 (04/16 0800)  Labs: Recent Labs    12/28/19 1737 12/28/19 1913 12/28/19 1943 12/29/19 0551  HGB 11.4*  --   --  11.1*  HCT 38.2  --   --  35.5*  PLT 150  --   --  144*  LABPROT  --   --  30.4* 31.1*  INR  --   --  2.9* 3.0*  CREATININE 1.72*  --   --  1.50*  TROPONINIHS 10 10  --   --     Estimated Creatinine Clearance: 19 mL/min (A) (by C-G formula based on SCr of 1.5 mg/dL (H)).   Medical History: Past Medical History:  Diagnosis Date  . Acute on chronic diastolic (congestive) heart failure ([place]) 01/22/2019  . Acute on chronic systolic CHF (congestive heart failure) ([place]) 03/04/2018  . Anemia   . Arthritis    "knees especially"  . Bradycardia   . Cardiomyopathy    presumed ischemic in the past with an EF of 35%. The most recent EF in 03-2008, however, was 55 -60%  . CHF 12/28/2007   Qualifier: Diagnosis of  By: Laney Potash, [name]    . Chronic anticoagulation 12/15/2015  . Chronic renal insufficiency   . Dementia ([place])    mild  . Dyslipidemia   . Encounter for therapeutic drug monitoring 12/25/2013  . Glaucoma   . Hypertension   . Malleolar fracture   . 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 Pacemaker- [employer] 12/25/2011   DOI 1/13   . PAF (paroxysmal atrial fibrillation) ([place]) 04/19/2017  . Paroxysmal atrial fibrillation (HCC)   . Sinus node dysfunction ([place]) 12/28/2007   Qualifier: Diagnosis of  By: Laney Potash, [name]     . Tachycardia-bradycardia syndrome ([place]) 09/19/2011   Assessment: 46 YOF presenting with with SOB, on warfarin PTA for Afib, last dose 4/14.  INR therapeutic on admission  PTA dosing: 2mg  daily,  except 1mg  on Sundays  Goal of Therapy:  INR 2-3 Monitor platelets by anticoagulation protocol: Yes   Plan:  Give warfarin 1 mg PO x 1 tonight Daily INR, s/s bleeding  Alanda Slim, PharmD, [facility] Clinical Pharmacist Please see AMION for all Pharmacists' Contact Phone Numbers 12/29/2019, 8:40 AM

## 2019-12-29 NOTE — Consult Note (Addendum)
Cardiology Consultation:   Patient ID: [name] MRN: [id number]; DOB: [birth year]  Admit date: 12/28/2019 Date of Consult: 12/29/2019  Primary Care Provider: Chriss Czar, MD Primary Cardiologist: Kathleen Breeding, MD  Primary Electrophysiologist:  Kathleen Axe, MD    Patient Profile:   [name] is a 86 y.o. female with a hx of tachybradycardia syndrome s/p pacemaker 02/3784 complicated by atrial lead dislodgment s/p revision, paroxysmal atrial fibrillation, hypothyroidism, chronic combined CHF, CKD stage III-IV, hypertension, hyperlipidemia and dementia who is being seen today for the evaluation of CHF at the request of Dr. Erlinda Salinas.   She went to ER at [facility] in March for CHF and possible aspiration.  Given Lasix and discharged.  Seen by Dr. Caryl Salinas December 07, 2019 virtually, she was doing fine with stable A. fib burden on device check.  Swallow study 12/21/2019, outside hospital. Recommend evaluation only, as no further SLP services are warranted for swallowing at this time. Risk for Aspiration: Mild(pt with cough x1 during PO trials with large bite, then pt independently adjusted bite size to smaller bites) Diet Solids Recommendation: Regular Solids (no restrictions) Diet Liquids Recommendations: No liquid consistency restrictions Compensatory Swallowing Strategies: Upright as possible for all oral intake, Small bites/sips Recommended Form of Medications: Whole, With liquid(as tolerated)  Last echo 01/2019 1. The left ventricle has severely reduced systolic function, with an  ejection fraction of 20-25%. The cavity size was mildly dilated. Left  ventricular diastolic Doppler parameters are consistent with restrictive  filling. Elevated mean left atrial  pressure There is abnormal septal motion consistent with left bundle  branch block. Left ventrical global hypokinesis without regional wall  motion abnormalities.  2. Right ventricular systolic pressure is moderately elevated  with an  estimated pressure of 55 mmHg.  3. There is mild mitral annular calcification present. The MR jet is  centrally-directed.  4. Tricuspid valve regurgitation is mild-moderate.  5. Mild thickening of the aortic valve Sclerosis without any evidence of  stenosis of the aortic valve. Aortic valve regurgitation is trivial by  color flow Doppler.  6. Left atrial size was severely dilated.  7. Right atrial size was mildly dilated.  8. When compared to the prior study: Previous images from 2009 are not  available for comparison, but reported normal LVEF 55-60%. Studies from  2007 reported LVEF 35-40%.   History of Present Illness:   [name] has baseline dementia and hard of hearing.    Per telephone note 12/28/19 "[name] from [name] and [name] calling stating the patient was seen by them 12/06/2019 and states the patient's BMP was 13617. She states they increased her lasix to 80mg  to 2x a day for a week, then went back to once a day.  On 12/25/2019 her BMP was 11255. She states the patient still had rails and JVD and some use of accessory muscles. They increased lasix to 80mg  twice a day again"   Sent from PCP office with persistent shortness of breath despite increase Lasix as above.  Patient reports hard to breath at laying down.  BNP 2019.9.  Potassium 3.3>>3.4.  Serum creatinine 1.72>>1.5.  On warfarin for anticoagulation.  Respiratory panel negative for influenza and Covid. Chest x ray suggest CHF with cardiomegaly and pulmonary edema with possible trace right effusion.  She was given IV Lasix with improved breathing and at least 650 cc diuresis.  Patient lives by herself in the [name].  Has two aid for her help and daughter frequently visits her and spent night.  Per  daughter, patient might be eating high salt diet.  Past Medical History:  Diagnosis Date  . Acute on chronic diastolic (congestive) heart failure ([place]) 01/22/2019  . Acute on chronic systolic CHF (congestive  heart failure) ([place]) 03/04/2018  . Anemia   . Arthritis    "knees especially"  . Bradycardia   . Cardiomyopathy    presumed ischemic in the past with an EF of 35%. The most recent EF in 03-2008, however, was 55 -60%  . CHF 12/28/2007   Qualifier: Diagnosis of  By: Laney Potash, [name]    . Chronic anticoagulation 12/15/2015  . Chronic renal insufficiency   . Dementia ([place])    mild  . Dyslipidemia   . Encounter for therapeutic drug monitoring 12/25/2013  . Glaucoma   . Hypertension   . Malleolar fracture   . Pacemaker infection ([place]) 08/18/2012  . Pacemaker- Medtronic 12/25/2011   DOI 1/13   . PAF (paroxysmal atrial fibrillation) ([place]) 04/19/2017  . Paroxysmal atrial fibrillation (HCC)   . Sinus node dysfunction ([place]) 12/28/2007   Qualifier: Diagnosis of  By: Laney Potash, [name]     . Tachycardia-bradycardia syndrome ([place]) 09/19/2011    Past Surgical History:  Procedure Laterality Date  . ABDOMINAL HYSTERECTOMY    . CARDIOVERSION  07/2011  . EYE SURGERY  2002   "blood clot behind eye"  . FRACTURE SURGERY  ~ 2007   RLE  . HEMORRHOID SURGERY    . INSERT / REPLACE / REMOVE PACEMAKER  09/17/11   initial placement  . INSERT / REPLACE / REMOVE PACEMAKER  09/18/11  . PACEMAKER REVISION N/A 09/18/2011   Procedure: PACEMAKER REVISION;  Surgeon: Thompson Grayer, MD;  Location[facility] CATH LAB;  Service: Cardiovascular;  Laterality: N/A;  . PERMANENT PACEMAKER INSERTION N/A 09/17/2011   Procedure: PERMANENT PACEMAKER INSERTION;  Surgeon: Deboraha Sprang, MD;  Location: [facility] CATH LAB;  Service: Cardiovascular;  Laterality: N/A;  . TONSILLECTOMY AND ADENOIDECTOMY       Inpatient Medications: Scheduled Meds: . amiodarone  200 mg Oral Daily  . atorvastatin  5 mg Oral QHS  . famotidine  20 mg Oral Daily  . furosemide  40 mg Intravenous Q12H  . levothyroxine  25 mcg Oral QAC breakfast  . potassium chloride  40 mEq Oral BID  . sodium chloride flush  3 mL Intravenous Q12H  . warfarin  1 mg Oral Once  . Warfarin -  Pharmacist Dosing Inpatient   Does not apply q1600   Continuous Infusions: . sodium chloride     PRN Meds: sodium chloride, acetaminophen, ondansetron (ZOFRAN) IV, sodium chloride flush  Allergies:   No Known Allergies  Social History:   Social History   Socioeconomic History  . Marital status: Widowed    Spouse name: Not on file  . Number of children: Not on file  . Years of education: Not on file  . Highest education level: Not on file  Occupational History    Employer: UNEMPLOYED  Tobacco Use  . Smoking status: Never Smoker  . Smokeless tobacco: Never Used  Substance and Sexual Activity  . Alcohol use: No  . Drug use: No  . Sexual activity: Never  Other Topics Concern  . Not on file  Social History Narrative  . Not on file   Social Determinants of Health   Financial Resource Strain:   . Difficulty of Paying Living Expenses:   Food Insecurity:   . Worried About Charity fundraiser in the Last Year:   .  Ran Out of Food in the Last Year:   Transportation Needs:   . Film/video editor (Medical):   Marland Kitchen Lack of Transportation (Non-Medical):   Physical Activity:   . Days of Exercise per Week:   . Minutes of Exercise per Session:   Stress:   . Feeling of Stress :   Social Connections:   . Frequency of Communication with Friends and Family:   . Frequency of Social Gatherings with Friends and Family:   . Attends Religious Services:   . Active Member of Clubs or Organizations:   . Attends Archivist Meetings:   Marland Kitchen Marital Status:   Intimate Partner Violence:   . Fear of Current or Ex-Partner:   . Emotionally Abused:   Marland Kitchen Physically Abused:   . Sexually Abused:     Family History:   Family History  Problem Relation Age of Onset  . Other Other        Noncontributory     ROS:  Please see the history of present illness.  All other ROS reviewed and negative.     Physical Exam/Data:   Vitals:   12/29/19 0400 12/29/19 0700 12/29/19 0800 12/29/19  1026  BP: (!) 89/55 127/64 122/68 109/65  Pulse: 62 (!) 53 64 61  Resp: 15 (!) 24 (!) 22 18  Temp:      TempSrc:      SpO2: 94% (!) 83% (!) 82% 98%  Weight:      Height:        Intake/Output Summary (Last 24 hours) at 12/29/2019 1037 Last data filed at 12/29/2019 0829 Gross per 24 hour  Intake --  Output 900 ml  Net -900 ml   Last 3 Weights 12/29/2019 12/07/2019 09/29/2019  Weight (lbs) 119 lb 119 lb 123 lb 6.4 oz  Weight (kg) 53.978 kg 53.978 kg 55.974 kg     Body mass index is 21.08 kg/m.  General:  Elderly ill appearing female in in no acute distress HEENT: normal Lymph: no adenopathy Neck: ? + JVD Endocrine:  No thryomegaly Vascular: No carotid bruits; FA pulses 2+ bilaterally without bruits  Cardiac:  normal S1, S2; RRR; + murmur Lungs: Faint rales with expiratory wheezing  abd: soft, nontender, no hepatomegaly  Ext: no edema Musculoskeletal:  No deformities, BUE and BLE strength normal and equal Skin: warm and dry  Neuro:  CNs 2-12 intact, no focal abnormalities noted Psych:  Normal affect   EKG:  The EKG was personally reviewed and demonstrates: A paced rhythm, left bundle branch block Telemetry:  Telemetry was personally reviewed and demonstrates: Paced rhythm  Relevant CV Studies: As above  Laboratory Data:  High Sensitivity Troponin:   Recent Labs  Lab 12/28/19 1737 12/28/19 1913  TROPONINIHS 10 10     Chemistry Recent Labs  Lab 12/28/19 1737 12/29/19 0551  NA 142 147*  K 3.3* 3.4*  CL 101 105  CO2 30 30  GLUCOSE 156* 107*  BUN 29* 27*  CREATININE 1.72* 1.50*  CALCIUM 9.2 8.7*  GFRNONAA 25* 30*  GFRAA 29* 34*  ANIONGAP 11 12    Hematology Recent Labs  Lab 12/28/19 1737 12/29/19 0551  WBC 7.1 6.2  RBC 3.61* 3.38*  HGB 11.4* 11.1*  HCT 38.2 35.5*  MCV 105.8* 105.0*  MCH 31.6 32.8  MCHC 29.8* 31.3  RDW 13.2 13.2  PLT 150 144*   BNP Recent Labs  Lab 12/28/19 1938  BNP 2,019.9*     Radiology/Studies:  DG Chest 2  View  Result Date: 12/28/2019 CLINICAL DATA:  Chest pain, history of CHF, PAF EXAM: CHEST - 2 VIEW COMPARISON:  Radiograph 11/26/2019 CT 05/22/2006 FINDINGS: Lung volumes are low with central vascular crowding suggests some atelectatic changes. Some hazy interstitial features are present as well with indistinct pulmonary vascularity and septal thickening. Obscuration of the right hemidiaphragm may reflect trace right pleural fluid. No left effusion or pneumothorax. Tortuosity of the tracheal air column is similar to priors. Cardiomegaly is similar to comparison exams cardiac leads at the right atrium and cardiac apex. The aorta is calcified. The remaining cardiomediastinal contours are unremarkable. The osseous structures appear diffusely demineralized which may limit detection of small or nondisplaced fractures. No acute osseous or soft tissue abnormality. Degenerative changes are present in the imaged spine and shoulders. Exaggerated thoracic kyphosis is noted. Finding similar to comparison. IMPRESSION: Features suggest CHF with cardiomegaly and pulmonary edema with possible trace right effusion. Additional atelectatic changes with low inspiration. Electronically Signed   By: Lovena Le M.D.   On: 12/28/2019 17:50   Assessment and Plan:   1. Acute on chronic combined CHF -Failed outpatient therapy.  BNP greater than 2000.  Chest x-ray suggests CHF with cardiomegaly and pulmonary edema with possible right trace effusion.  Lung exam with faint Rales and expiratory wheezing.  Diurese at least 650 cc.  Per daughter, patient felts better when on twice daily dose of Lasix 80 mg.  Daughter (lives in [place]) and Aids prepares her meal and some foods are high in salt. Will need strict diet control and education. Social worker help will be appreciated.  -EF 20-25% by echo 01/2019. Will not repeat as it would not change management.  -Home lisinopril and Lopressor on hold due to soft blood pressure  2.  Chronic  kidney disease stage III/IV -Creatinine improving with diuresis  3.  S/p pacemaker -Followed by Dr. Caryl Salinas  4.  Paroxysmal atrial fibrillation -Recent device check by Dr. Caryl Salinas showed stable burden of A. Fib -On amiodarone and warfarin.  Dr. Harrell Gave to see.   For questions or updates, please contact [place] Please consult [email or web address] for contact info under     Jarrett Soho, PA  12/29/2019 10:37 AM

## 2019-12-30 DIAGNOSIS — R0602 Shortness of breath: Secondary | ICD-10-CM | POA: Diagnosis not present

## 2019-12-30 DIAGNOSIS — I5023 Acute on chronic systolic (congestive) heart failure: Secondary | ICD-10-CM | POA: Diagnosis not present

## 2019-12-30 DIAGNOSIS — N189 Chronic kidney disease, unspecified: Secondary | ICD-10-CM | POA: Diagnosis not present

## 2019-12-30 DIAGNOSIS — N179 Acute kidney failure, unspecified: Secondary | ICD-10-CM | POA: Diagnosis not present

## 2019-12-30 LAB — BLOOD GAS, ARTERIAL
Acid-Base Excess: 0.9 mmol/L (ref 0.0–2.0)
Bicarbonate: 28.3 mmol/L — ABNORMAL HIGH (ref 20.0–28.0)
Drawn by: 245131
FIO2: 100
O2 Saturation: 85.7 %
Patient temperature: 36.4
pCO2 arterial: 74.1 mmHg (ref 32.0–48.0)
pH, Arterial: 7.202 — ABNORMAL LOW (ref 7.350–7.450)
pO2, Arterial: 64.5 mmHg — ABNORMAL LOW (ref 83.0–108.0)

## 2019-12-30 LAB — BASIC METABOLIC PANEL
Anion gap: 13 (ref 5–15)
BUN: 31 mg/dL — ABNORMAL HIGH (ref 8–23)
CO2: 25 mmol/L (ref 22–32)
Calcium: 9.3 mg/dL (ref 8.9–10.3)
Chloride: 106 mmol/L (ref 98–111)
Creatinine, Ser: 1.75 mg/dL — ABNORMAL HIGH (ref 0.44–1.00)
GFR calc Af Amer: 28 mL/min — ABNORMAL LOW (ref >60)
GFR calc non Af Amer: 24 mL/min — ABNORMAL LOW (ref >60)
Glucose, Bld: 159 mg/dL — ABNORMAL HIGH (ref 70–99)
Potassium: 4.1 mmol/L (ref 3.5–5.1)
Sodium: 144 mmol/L (ref 135–145)

## 2019-12-30 LAB — PROTIME-INR
INR: 3 — ABNORMAL HIGH (ref 0.8–1.2)
Prothrombin Time: 31.2 seconds — ABNORMAL HIGH (ref 11.4–15.2)

## 2019-12-30 LAB — MAGNESIUM: Magnesium: 2.1 mg/dL (ref 1.7–2.4)

## 2019-12-30 LAB — VITAMIN B12: Vitamin B-12: 577 pg/mL (ref 180–914)

## 2019-12-30 LAB — FOLATE: Folate: 39.1 ng/mL (ref >5.9)

## 2019-12-30 MED ORDER — BRIMONIDINE TARTRATE 0.2 % OP SOLN
1.0000 [drp] | Freq: Two times a day (BID) | OPHTHALMIC | Status: DC
  Administered 2019-12-30 – 2020-01-02 (×7): 1 [drp] via OPHTHALMIC
  Filled 2019-12-30: qty 5

## 2019-12-30 MED ORDER — LATANOPROST 0.005 % OP SOLN
1.0000 [drp] | Freq: Every day | OPHTHALMIC | Status: DC
  Administered 2019-12-30 – 2020-01-01 (×3): 1 [drp] via OPHTHALMIC
  Filled 2019-12-30: qty 2.5

## 2019-12-30 MED ORDER — WARFARIN SODIUM 1 MG PO TABS
1.0000 mg | ORAL_TABLET | Freq: Once | ORAL | Status: AC
  Administered 2019-12-30: 1 mg via ORAL
  Filled 2019-12-30: qty 1

## 2019-12-30 MED ORDER — POTASSIUM CHLORIDE 20 MEQ PO PACK
40.0000 meq | PACK | Freq: Two times a day (BID) | ORAL | Status: DC
  Administered 2019-12-30 – 2020-01-02 (×7): 40 meq via ORAL
  Filled 2019-12-30 (×7): qty 2

## 2019-12-30 MED ORDER — FUROSEMIDE 10 MG/ML IJ SOLN
80.0000 mg | Freq: Two times a day (BID) | INTRAMUSCULAR | Status: DC
  Administered 2019-12-30 – 2020-01-01 (×4): 80 mg via INTRAVENOUS
  Filled 2019-12-30 (×4): qty 8

## 2019-12-30 MED ORDER — OLOPATADINE HCL 0.1 % OP SOLN
1.0000 [drp] | Freq: Every morning | OPHTHALMIC | Status: DC
  Administered 2019-12-30 – 2020-01-02 (×4): 1 [drp] via OPHTHALMIC
  Filled 2019-12-30: qty 5

## 2019-12-30 MED ORDER — MIRTAZAPINE 15 MG PO TABS
7.5000 mg | ORAL_TABLET | Freq: Every day | ORAL | Status: DC
  Administered 2019-12-30 – 2020-01-01 (×3): 7.5 mg via ORAL
  Filled 2019-12-30 (×3): qty 1

## 2019-12-30 NOTE — Progress Notes (Signed)
RN went to check on patient and NRB  mask was off, RN placed mask back on and O2 sats were about 85%, then went up to 92%. Increased sats did not stay long and patient quickly went down to about 86-87% on NRB. RN paged provider, RT, and RR. Orders for BiPaP placed.

## 2019-12-30 NOTE — Progress Notes (Signed)
[place] for warfarin Indication: atrial fibrillation  No Known Allergies  Patient Measurements: Height: 5' (152.4 cm) Weight: 54.1 kg (119 lb 4.3 oz) IBW/kg (Calculated) : 45.5  Vital Signs: Temp: 98 F (36.7 C) (04/17 0752) Temp Source: Oral (04/17 0434) BP: 107/57 (04/17 0800) Pulse Rate: 70 (04/17 0850)  Labs: Recent Labs    12/28/19 1737 12/28/19 1913 12/28/19 1943 12/29/19 0551 12/30/19 0238  HGB 11.4*  --   --  11.1*  --   HCT 38.2  --   --  35.5*  --   PLT 150  --   --  144*  --   LABPROT  --   --  30.4* 31.1* 31.2*  INR  --   --  2.9* 3.0* 3.0*  CREATININE 1.72*  --   --  1.50* 1.75*  TROPONINIHS 10 10  --   --   --     Estimated Creatinine Clearance: 14.1 mL/min (A) (by C-G formula based on SCr of 1.75 mg/dL (H)).   Medical History: Past Medical History:  Diagnosis Date  . Acute on chronic diastolic (congestive) heart failure ([place]) 01/22/2019  . Acute on chronic systolic CHF (congestive heart failure) ([place]) 03/04/2018  . Anemia   . Arthritis    "knees especially"  . Bradycardia   . Cardiomyopathy    presumed ischemic in the past with an EF of 35%. The most recent EF in 03-2008, however, was 55 -60%  . CHF 12/28/2007   Qualifier: Diagnosis of  By: Laney Potash, [name]    . Chronic anticoagulation 12/15/2015  . Chronic renal insufficiency   . Dementia ([place])    mild  . Dyslipidemia   . Encounter for therapeutic drug monitoring 12/25/2013  . Glaucoma   . Hypertension   . Malleolar fracture   . Pacemaker infection ([place]) 08/18/2012  . Pacemaker- Medtronic 12/25/2011   DOI 1/13   . PAF (paroxysmal atrial fibrillation) ([place]) 04/19/2017  . Paroxysmal atrial fibrillation (HCC)   . Sinus node dysfunction ([place]) 12/28/2007   Qualifier: Diagnosis of  By: Laney Potash, [name]     . Tachycardia-bradycardia syndrome ([place]) 09/19/2011   Assessment: 76 YOF presenting with with SOB, on warfarin PTA for Afib. INR remains therapeutic at  3.  PTA dosing: 2mg  daily, except 1mg  on Sundays  Goal of Therapy:  INR 2-3 Monitor platelets by anticoagulation protocol: Yes   Plan:  Give warfarin 1mg  PO again tonight Daily INR, s/s bleeding  Arrie Senate, PharmD, BCPS Clinical Pharmacist [phone] Please check AMION for all [place] numbers 12/30/2019

## 2019-12-30 NOTE — Progress Notes (Signed)
ABG drawn and sent to lab at 0103, tube  Has left the station on 3E

## 2019-12-30 NOTE — Significant Event (Signed)
Patient refused to Drink potassium liquid, Pharmacist aware and plan to change to powder in hopes for a more milder taste.  Patient does not was Lunch Dumplings and pea. Plan to reorder food.

## 2019-12-30 NOTE — Progress Notes (Addendum)
PROGRESS NOTE  [name] NUU:[id number] DOB: Nov 14, 1922 DOA: 3/47/4259 PCP: Chriss Czar, MD  HPI/Recap of past 24 hours:  She developed respiratory distress yesterday evening around 11 PM , O2 sats were 83% on room air, RN placed O2 at 4L and O2 sats went up to 89-90%. Patient was very anxious, diaphoretic , Pt tachypneic, using accessory muscles to breathe,Lung sounds- wheezing and some crackles, stat ABG>7.20/74.1/64.5/28.3, she was started on bipap, felt better , this am she is able to wean off bipap, now on nasal cannula 4liter , o2 sats high 90's  Hard of hearing, denies chest pain  Assessment/Plan: Principal Problem:   Acute on chronic combined systolic and diastolic congestive heart failure (HCC) Active Problems:   Dyslipidemia   Hypertension   PAF (paroxysmal atrial fibrillation) (HCC)   Hypothyroidism   Acute kidney injury superimposed on CKD ([place])   CHF (congestive heart failure) ([place])  Acute on chronic combined chf with hypotension EF 20% Failed outpatient lasix therapy Currently on iv lasix  Has respiratory stress event yesterday evening required bipap briefly, currently she is on 4liters at rest Cardiology consulted, input appreciated, will follow recommendation  Paroxysmal atrial fibrillation/Tachybradycardia syndrome s/p PPM: In paced rhythm with controlled rate on admission.  Continue amiodarone and Coumadin per pharmacy.  Holding metoprolol due to hypotension.  HTN Now hypotensive hold Lopressor lisinopril  CKD 3b/anemia of chronic disease hgb  11 Monitor renal function while on Lasix and having hypotension Hold lisinopril  Macrocytosis, mcv 105,   B12/folate unremarkable  Hypokalemia, replaced and normalized  Hypothyroidism: Continue Synthroid.  Hyperlipidemia: Continue rosuvastatin.  Body mass index is 23.29 kg/m.   DVT Prophylaxis: Coumadin  Code Status: full  Family Communication: patient , daughter over the phone with  permission  Disposition Plan:    Patient came from:          home alone     , but daughter states can arrange 24/7 care at home if needs be                                                                                           Anticipated d/c place: home   Barriers to d/c OR conditions which need to be met to effect a safe d/c: needs cardiology clearance,    Consultants:  cardiology  Procedures:  none  Antibiotics:  none   Objective: BP (!) 107/57   Pulse 70   Temp 98 F (36.7 C)   Resp 18   Ht 5' (1.524 m)   Wt 54.1 kg   SpO2 96%   BMI 23.29 kg/m   Intake/Output Summary (Last 24 hours) at 12/30/2019 1127 Last data filed at 12/30/2019 1000 Gross per 24 hour  Intake 1060 ml  Output 1050 ml  Net 10 ml   Filed Weights   12/29/19 0030 12/29/19 1408 12/30/19 0434  Weight: 54 kg 53.6 kg 54.1 kg    Exam: Patient is examined daily including today on 12/30/2019, exams remain the same as of yesterday except that has changed    General:  Alert, awake , pleasant, hard of hearing  Cardiovascular: Paced rhythm  Respiratory: Crackles in the bases  Abdomen: Soft/ND/NT, positive BS  Musculoskeletal: No Edema  Neuro: alert, oriented   Data Reviewed: Basic Metabolic Panel: Recent Labs  Lab 12/28/19 1737 12/28/19 2101 12/29/19 0551 12/30/19 0238  NA 142  --  147* 144  K 3.3*  --  3.4* 4.1  CL 101  --  105 106  CO2 30  --  30 25  GLUCOSE 156*  --  107* 159*  BUN 29*  --  27* 31*  CREATININE 1.72*  --  1.50* 1.75*  CALCIUM 9.2  --  8.7* 9.3  MG  --  2.0 2.1 2.1   Liver Function Tests: No results for input(s): AST, ALT, ALKPHOS, BILITOT, PROT, ALBUMIN in the last 168 hours. No results for input(s): LIPASE, AMYLASE in the last 168 hours. No results for input(s): AMMONIA in the last 168 hours. CBC: Recent Labs  Lab 12/28/19 1737 12/29/19 0551  WBC 7.1 6.2  HGB 11.4* 11.1*  HCT 38.2 35.5*  MCV 105.8* 105.0*  PLT 150 144*   Cardiac Enzymes:   No  results for input(s): CKTOTAL, CKMB, CKMBINDEX, TROPONINI in the last 168 hours. BNP (last 3 results) Recent Labs    01/20/19 1407 12/28/19 1938  BNP 2,283.5* 2,019.9*    ProBNP (last 3 results) No results for input(s): PROBNP in the last 8760 hours.  CBG: No results for input(s): GLUCAP in the last 168 hours.  Recent Results (from the past 240 hour(s))  Respiratory Panel by RT PCR (Flu A&B, Covid) - Nasopharyngeal Swab     Status: None   Collection Time: 12/28/19  7:57 PM   Specimen: Nasopharyngeal Swab  Result Value Ref Range Status   SARS Coronavirus 2 by RT PCR NEGATIVE NEGATIVE Final    Comment: (NOTE) SARS-CoV-2 target nucleic acids are NOT DETECTED. The SARS-CoV-2 RNA is generally detectable in upper respiratoy specimens during the acute phase of infection. The lowest concentration of SARS-CoV-2 viral copies this assay can detect is 131 copies/mL. A negative result does not preclude SARS-Cov-2 infection and should not be used as the sole basis for treatment or other patient management decisions. A negative result may occur with  improper specimen collection/handling, submission of specimen other than nasopharyngeal swab, presence of viral mutation(s) within the areas targeted by this assay, and inadequate number of viral copies (<131 copies/mL). A negative result must be combined with clinical observations, patient history, and epidemiological information. The expected result is Negative. Fact Sheet for Patients:  PinkCheek.be Fact Sheet for Healthcare Providers:  GravelBags.it This test is not yet ap proved or cleared by the [place] FDA and  has been authorized for detection and/or diagnosis of SARS-CoV-2 by FDA under an Emergency Use Authorization (EUA). This EUA will remain  in effect (meaning this test can be used) for the duration of the COVID-19 declaration under Section 564(b)(1) of the Act, 21  U.S.C. section 360bbb-3(b)(1), unless the authorization is terminated or revoked sooner.    Influenza A by PCR NEGATIVE NEGATIVE Final   Influenza B by PCR NEGATIVE NEGATIVE Final    Comment: (NOTE) The Xpert Xpress SARS-CoV-2/FLU/RSV assay is intended as an aid in  the diagnosis of influenza from Nasopharyngeal swab specimens and  should not be used as a sole basis for treatment. Nasal washings and  aspirates are unacceptable for Xpert Xpress SARS-CoV-2/FLU/RSV  testing. Fact Sheet for Patients: PinkCheek.be Fact Sheet for Healthcare Providers: GravelBags.it This test is not yet approved or cleared by the [place] FDA  and  has been authorized for detection and/or diagnosis of SARS-CoV-2 by  FDA under an Emergency Use Authorization (EUA). This EUA will remain  in effect (meaning this test can be used) for the duration of the  Covid-19 declaration under Section 564(b)(1) of the Act, 21  U.S.C. section 360bbb-3(b)(1), unless the authorization is  terminated or revoked. Performed at [facility], [place] [address], [place], [place] [address]      Studies: DG CHEST PORT 1 VIEW  Result Date: 12/29/2019 CLINICAL DATA:  Shortness of breath EXAM: PORTABLE CHEST 1 VIEW COMPARISON:  12/28/2019 FINDINGS: Cardiomegaly with left chest multi lead pacer. Small layering right pleural effusion, similar to prior examination. The visualized skeletal structures are unremarkable. IMPRESSION: Cardiomegaly with a small, layering right pleural effusion, similar to prior examination. There is no new or focal airspace opacity. Electronically Signed   By: Eddie Candle M.D.   On: 12/29/2019 23:31    Scheduled Meds: . amiodarone  200 mg Oral Daily  . atorvastatin  5 mg Oral QHS  . famotidine  20 mg Oral Daily  . furosemide  40 mg Intravenous Q12H  . levothyroxine  25 mcg Oral QAC breakfast  . potassium chloride  40 mEq Oral BID  . sodium  chloride flush  3 mL Intravenous Q12H  . warfarin  1 mg Oral ONCE-1600  . Warfarin - Pharmacist Dosing Inpatient   Does not apply q1600    Continuous Infusions: . sodium chloride       Time spent: 39mns, case discussed with cardiology I have personally reviewed and interpreted on  12/30/2019 daily labs, tele strips, imagings as discussed above under date review session and assessment and plans.  I reviewed all nursing notes, pharmacy notes, consultant notes,  vitals, pertinent old records  I have discussed plan of care as described above with RN , patient on 12/30/2019   FFlorencia ReasonsMD, PhD, FACP  [facility]  Available via Epic secure chat 7am-7pm for nonurgent issues Please page for urgent issues, pager number available through [email or web address] .   12/30/2019, 11:27 AM  LOS: 1 day

## 2019-12-30 NOTE — Progress Notes (Signed)
Family notified about patients change in plan of care/level of car. RN spoke with [name], patients daughter.

## 2019-12-30 NOTE — Plan of Care (Signed)

## 2019-12-30 NOTE — Progress Notes (Signed)
Left Arm swelling at IV Site after admin of IV Lasix Removed applied Gauze.

## 2019-12-30 NOTE — Plan of Care (Signed)
  Problem: Education: Goal: Ability to demonstrate management of disease process will improve 12/30/2019 1846 by Rolm Baptise, RN Outcome: Progressing 12/30/2019 1740 by Rolm Baptise, RN Outcome: Progressing Goal: Ability to verbalize understanding of medication therapies will improve 12/30/2019 1846 by Rolm Baptise, RN Outcome: Progressing 12/30/2019 1740 by Rolm Baptise, RN Outcome: Progressing Goal: Individualized Educational Video(s) 12/30/2019 1846 by Rolm Baptise, RN Outcome: Progressing 12/30/2019 1740 by Rolm Baptise, RN Outcome: Progressing   Problem: Activity: Goal: Capacity to carry out activities will improve 12/30/2019 1846 by Rolm Baptise, RN Outcome: Progressing 12/30/2019 1740 by Rolm Baptise, RN Outcome: Progressing   Problem: Cardiac: Goal: Ability to achieve and maintain adequate cardiopulmonary perfusion will improve 12/30/2019 1846 by Rolm Baptise, RN Outcome: Progressing 12/30/2019 1740 by Rolm Baptise, RN Outcome: Progressing   Problem: Education: Goal: Knowledge of General Education information will improve Description: Including pain rating scale, medication(s)/side effects and non-pharmacologic comfort measures 12/30/2019 1846 by Rolm Baptise, RN Outcome: Progressing 12/30/2019 1740 by Rolm Baptise, RN Outcome: Progressing   Problem: Health Behavior/Discharge Planning: Goal: Ability to manage health-related needs will improve 12/30/2019 1846 by Rolm Baptise, RN Outcome: Progressing 12/30/2019 1740 by Karyl Kinnier D, RN Outcome: Progressing   Problem: Clinical Measurements: Goal: Ability to maintain clinical measurements within normal limits will improve 12/30/2019 1846 by Rolm Baptise, RN Outcome: Progressing 12/30/2019 1740 by Karyl Kinnier D, RN Outcome: Progressing Goal: Will remain free from infection 12/30/2019 1846 by Karyl Kinnier D, RN Outcome: Progressing 12/30/2019 1740 by Karyl Kinnier D, RN Outcome:  Progressing Goal: Diagnostic test results will improve 12/30/2019 1846 by Rolm Baptise, RN Outcome: Progressing 12/30/2019 1740 by Karyl Kinnier D, RN Outcome: Progressing Goal: Respiratory complications will improve 12/30/2019 1846 by Rolm Baptise, RN Outcome: Progressing 12/30/2019 1740 by Karyl Kinnier D, RN Outcome: Progressing Goal: Cardiovascular complication will be avoided 12/30/2019 1846 by Rolm Baptise, RN Outcome: Progressing 12/30/2019 1740 by Rolm Baptise, RN Outcome: Progressing   Problem: Activity: Goal: Risk for activity intolerance will decrease 12/30/2019 1846 by Rolm Baptise, RN Outcome: Progressing 12/30/2019 1740 by Rolm Baptise, RN Outcome: Progressing   Problem: Nutrition: Goal: Adequate nutrition will be maintained 12/30/2019 1846 by Rolm Baptise, RN Outcome: Progressing 12/30/2019 1740 by Karyl Kinnier D, RN Outcome: Progressing   Problem: Coping: Goal: Level of anxiety will decrease 12/30/2019 1846 by Rolm Baptise, RN Outcome: Progressing 12/30/2019 1740 by Karyl Kinnier D, RN Outcome: Progressing   Problem: Elimination: Goal: Will not experience complications related to bowel motility 12/30/2019 1846 by Rolm Baptise, RN Outcome: Progressing 12/30/2019 1740 by Rolm Baptise, RN Outcome: Progressing Goal: Will not experience complications related to urinary retention 12/30/2019 1846 by Rolm Baptise, RN Outcome: Progressing 12/30/2019 1740 by Rolm Baptise, RN Outcome: Progressing   Problem: Pain Managment: Goal: General experience of comfort will improve 12/30/2019 1846 by Rolm Baptise, RN Outcome: Progressing 12/30/2019 1740 by Karyl Kinnier D, RN Outcome: Progressing   Problem: Safety: Goal: Ability to remain free from injury will improve 12/30/2019 1846 by Rolm Baptise, RN Outcome: Progressing 12/30/2019 1740 by Karyl Kinnier D, RN Outcome: Progressing   Problem: Skin Integrity: Goal: Risk for impaired  skin integrity will decrease 12/30/2019 1846 by Rolm Baptise, RN Outcome: Progressing 12/30/2019 1740 by Rolm Baptise, RN Outcome: Progressing

## 2019-12-30 NOTE — Progress Notes (Signed)
Physical Therapy Treatment Patient Details Name: [name] MRN: [id number] DOB: 1923/02/18 Today's Date: 12/30/2019    History of Present Illness Pt is a 86 y/o female c/o progressive SOB x2 weeks, ultimately referred to ED by [facility] RN and OP Cardiology. Covid negative. Admitted due to acute on chronic CHF exacerbation. PMH CHF, cardiomyopathy, chronic anticoagulation, renal insufficiency, dementia, HTN, dyslipidemia, hx ankle fracture, tachy-brady syndrome s/p PPM and pace maker revision    PT Comments    Pt greatly limited this session secondary cognitive deficits, confusion and very HOH. Assisted pt from bed to bathroom to void. She required min A for transfers and close min guard with occasional min A to ambulate in room with RW. Pt would continue to benefit from skilled physical therapy services at this time while admitted and after d/c to address the below listed limitations in order to improve overall safety and independence with functional mobility.    Follow Up Recommendations  Home health PT;Supervision/Assistance - 24 hour     Equipment Recommendations  None recommended by PT    Recommendations for Other Services       Precautions / Restrictions Precautions Precautions: Fall Precaution Comments: very HOH Restrictions Weight Bearing Restrictions: No    Mobility  Bed Mobility Overal bed mobility: Needs Assistance Bed Mobility: Supine to Sit     Supine to sit: Min guard     General bed mobility comments: HOB elevated, extended time and use of bed rails  Transfers Overall transfer level: Needs assistance Equipment used: Rolling walker (2 wheeled) Transfers: Sit to/from Stand Sit to Stand: Min assist         General transfer comment: assistance to power into standing from EOB x1 and from toilet x1  Ambulation/Gait Ambulation/Gait assistance: Min guard;Min assist Gait Distance (Feet): 20 Feet Assistive device: Rolling walker (2 wheeled) Gait  Pattern/deviations: Step-through pattern;Trunk flexed Gait velocity: decreased   General Gait Details: frequent cueing and assistance to maintain proximity to RW; 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    Modified Rankin (Stroke Patients Only)       Balance Overall balance assessment: Needs assistance Sitting-balance support: Feet supported Sitting balance-Leahy Scale: Good     Standing balance support: During functional activity;Bilateral upper extremity supported;Single extremity supported Standing balance-Leahy Scale: Poor                              Cognition Arousal/Alertness: Awake/alert Behavior During Therapy: WFL for tasks assessed/performed Overall Cognitive Status: Difficult to assess Area of Impairment: Following commands;Safety/judgement;Problem solving                       Following Commands: Follows one step commands with increased time;Follows one step commands inconsistently Safety/Judgement: Decreased awareness of deficits;Decreased awareness of safety   Problem Solving: Slow processing;Decreased initiation;Difficulty sequencing;Requires verbal cues        Exercises      General Comments        Pertinent Vitals/Pain Pain Assessment: No/denies pain    Home Living                      Prior Function            PT Goals (current goals can now be found in the care plan section) Acute Rehab PT Goals PT Goal  Formulation: With patient Time For Goal Achievement: 01/12/20 Potential to Achieve Goals: Good Progress towards PT goals: Progressing toward goals    Frequency    Min 3X/week      PT Plan Current plan remains appropriate    Co-evaluation              AM-PAC PT "6 Clicks" Mobility   Outcome Measure  Help needed turning from your back to your side while in a flat bed without using bedrails?: None Help needed moving from lying on your back to  sitting on the side of a flat bed without using bedrails?: None Help needed moving to and from a bed to a chair (including a wheelchair)?: A Little Help needed standing up from a chair using your arms (e.g., wheelchair or bedside chair)?: A Little Help needed to walk in hospital room?: A Little Help needed climbing 3-5 steps with a railing? : A Lot 6 Click Score: 19    End of Session Equipment Utilized During Treatment: Gait belt Activity Tolerance: Patient tolerated treatment well Patient left: with call bell/phone within reach;with nursing/sitter in room;Other (comment)(with RN sitting in a chair at sink for a wash-up) Nurse Communication: Mobility status PT Visit Diagnosis: Muscle weakness (generalized) (M62.81)     Time: 8101-7510 PT Time Calculation (min) (ACUTE ONLY): 27 min  Charges:  $Gait Training: 8-22 mins $Therapeutic Activity: 8-22 mins                     Anastasio Champion, DPT  Acute Rehabilitation Services Pager [phone] [place] 12/30/2019, 5:21 PM

## 2019-12-30 NOTE — Progress Notes (Signed)
Patient is confused and need to encouraged to eat. Barely able to get her to take medicate. After multiple attempts patient took medications.

## 2019-12-30 NOTE — Progress Notes (Signed)
Progress Note  Patient Name: [name] Date of Encounter: 12/30/2019  Primary Cardiologist: Minus Breeding, MD   Subjective   Last night at 11 PM, rapid response called when her O2 sats dropped to 83% on room air. She was initially treated with nonrebreather with improvement. She was noted to be confused/anxious, pulling off NRB. Crackles heard in the lungs. ABG showed pH 7.202, pCO2 74.1, pO2 64.5, bicarb 28. She was placed on bipap with improvement, and this AM was transitioned back to nasal cannula. I reviewed CXR, with R>L edema and small R pleural effusion.  She is alone in her room this AM, breathing comfortably on nasal cannula. Very hard of hearing. Has a difficult time describing what happened overnight. Believes that a nurse riding a motorcycle came and gave her purple medicine, and she felt better.   She thinks her daughter will be up later today. I mentioned a program at home that would help her with her symptoms and avoid coming back to the hospital. She is interested but would like her daughter to be involved with the details.  Inpatient Medications    Scheduled Meds: . amiodarone  200 mg Oral Daily  . atorvastatin  5 mg Oral QHS  . brimonidine  1 drop Both Eyes BID  . famotidine  20 mg Oral Daily  . furosemide  80 mg Intravenous Q12H  . latanoprost  1 drop Both Eyes QHS  . levothyroxine  25 mcg Oral QAC breakfast  . mirtazapine  7.5 mg Oral QHS  . olopatadine  1 drop Both Eyes q morning - 10a  . potassium chloride  40 mEq Oral BID  . sodium chloride flush  3 mL Intravenous Q12H  . warfarin  1 mg Oral ONCE-1600  . Warfarin - Pharmacist Dosing Inpatient   Does not apply q1600   Continuous Infusions: . sodium chloride     PRN Meds: sodium chloride, acetaminophen, ipratropium-albuterol, ondansetron (ZOFRAN) IV, sodium chloride flush   Vital Signs    Vitals:   12/30/19 0800 12/30/19 0850 12/30/19 0944 12/30/19 1148  BP: (!) 107/57   (!) 142/70  Pulse: (!)  59 70  72  Resp:  18    Temp:    98 F (36.7 C)  TempSrc:    Oral  SpO2: 98%  96% 98%  Weight:      Height:        Intake/Output Summary (Last 24 hours) at 12/30/2019 1437 Last data filed at 12/30/2019 1300 Gross per 24 hour  Intake 1280 ml  Output 1050 ml  Net 230 ml   Last 3 Weights 12/30/2019 12/29/2019 12/29/2019  Weight (lbs) 119 lb 4.3 oz 118 lb 1.6 oz 119 lb  Weight (kg) 54.1 kg 53.57 kg 53.978 kg      Telemetry    Paced rhythm - Personally Reviewed  ECG    AV dual paced rhythm - Personally Reviewed  Physical Exam   GEN: Frail, elderly woman resting comfortably in bed, [place] in place   Neck: JVD  Cardiac: RRR, no murmurs, rubs, or gallops.  Respiratory: Mildly  GI: Soft, nontender, non-distended  MS: No edema; No deformity. Neuro:  Nonfocal  Psych: Normal affect   Labs    High Sensitivity Troponin:   Recent Labs  Lab 12/28/19 1737 12/28/19 1913  TROPONINIHS 10 10      Chemistry Recent Labs  Lab 12/28/19 1737 12/29/19 0551 12/30/19 0238  NA 142 147* 144  K 3.3* 3.4* 4.1  CL 101  105 106  CO2 30 30 25   GLUCOSE 156* 107* 159*  BUN 29* 27* 31*  CREATININE 1.72* 1.50* 1.75*  CALCIUM 9.2 8.7* 9.3  GFRNONAA 25* 30* 24*  GFRAA 29* 34* 28*  ANIONGAP 11 12 13      Hematology Recent Labs  Lab 12/28/19 1737 12/29/19 0551  WBC 7.1 6.2  RBC 3.61* 3.38*  HGB 11.4* 11.1*  HCT 38.2 35.5*  MCV 105.8* 105.0*  MCH 31.6 32.8  MCHC 29.8* 31.3  RDW 13.2 13.2  PLT 150 144*    BNP Recent Labs  Lab 12/28/19 1938  BNP 2,019.9*     DDimer No results for input(s): DDIMER in the last 168 hours.   Radiology    DG Chest 2 View  Result Date: 12/28/2019 CLINICAL DATA:  Chest pain, history of CHF, PAF EXAM: CHEST - 2 VIEW COMPARISON:  Radiograph 11/26/2019 CT 05/22/2006 FINDINGS: Lung volumes are low with central vascular crowding suggests some atelectatic changes. Some hazy interstitial features are present as well with indistinct pulmonary vascularity  and septal thickening. Obscuration of the right hemidiaphragm may reflect trace right pleural fluid. No left effusion or pneumothorax. Tortuosity of the tracheal air column is similar to priors. Cardiomegaly is similar to comparison exams cardiac leads at the right atrium and cardiac apex. The aorta is calcified. The remaining cardiomediastinal contours are unremarkable. The osseous structures appear diffusely demineralized which may limit detection of small or nondisplaced fractures. No acute osseous or soft tissue abnormality. Degenerative changes are present in the imaged spine and shoulders. Exaggerated thoracic kyphosis is noted. Finding similar to comparison. IMPRESSION: Features suggest CHF with cardiomegaly and pulmonary edema with possible trace right effusion. Additional atelectatic changes with low inspiration. Electronically Signed   By: Lovena Le M.D.   On: 12/28/2019 17:50   DG CHEST PORT 1 VIEW  Result Date: 12/29/2019 CLINICAL DATA:  Shortness of breath EXAM: PORTABLE CHEST 1 VIEW COMPARISON:  12/28/2019 FINDINGS: Cardiomegaly with left chest multi lead pacer. Small layering right pleural effusion, similar to prior examination. The visualized skeletal structures are unremarkable. IMPRESSION: Cardiomegaly with a small, layering right pleural effusion, similar to prior examination. There is no new or focal airspace opacity. Electronically Signed   By: Eddie Candle M.D.   On: 12/29/2019 23:31    Cardiac Studies   Last echo 01/2019 1. The left ventricle has severely reduced systolic function, with an  ejection fraction of 20-25%. The cavity size was mildly dilated. Left  ventricular diastolic Doppler parameters are consistent with restrictive  filling. Elevated mean left atrial  pressure There is abnormal septal motion consistent with left bundle  branch block. Left ventrical global hypokinesis without regional wall  motion abnormalities.  2. Right ventricular systolic pressure is  moderately elevated with an  estimated pressure of 55 mmHg.  3. There is mild mitral annular calcification present. The MR jet is  centrally-directed.  4. Tricuspid valve regurgitation is mild-moderate.  5. Mild thickening of the aortic valve Sclerosis without any evidence of  stenosis of the aortic valve. Aortic valve regurgitation is trivial by  color flow Doppler.  6. Left atrial size was severely dilated.  7. Right atrial size was mildly dilated.  8. When compared to the prior study: Previous images from 2009 are not  available for comparison, but reported normal LVEF 55-60%. Studies from  2007 reported LVEF 35-40%.   Patient Profile     86 y.o. female  with a hx of tachybradycardia syndrome s/p pacemaker 0/0370 complicated  by atrial lead dislodgment s/p revision, paroxysmal atrial fibrillation, hypothyroidism, chronic combined CHF, CKD stage III-IV, hypertension, hyperlipidemia and dementia who is being followed for the evaluation of CHF at the request of Dr. Erlinda Hong.   Assessment & Plan    Acute on chronic combined systolic and diastolic heart failure: -clinically appears to have improved exam today. However, had brief hypoxic and hypercapnic respiratory failure overnight requiring BiPAP, now back on nasal cannula and comfortable. -EF known to be 20-25% -does not appear she made much additional urine on furosemide 40 mg IV BID. Increasing to 80 mg IV BID. Do not expect she will need a large volume diuresis based on exam. Her creatinine has also increased from 1.50 to 1.75, and her BUN is up slightly. This suggests she may be sequestering fluid in the lungs but not be vascularly volume up. If she doesn't respond significantly with higher dose lasix, this would be my suspicion. -blood pressures have been labile, ranging 92/57-142/70. No room for medication titration. -I am concerned that her condition can deteriorate so quickly. Will make long term management difficult.  I brought up  palliative care with her today. She would like to talk to her daughter about this. I also spoke with Dr. Percival Spanish, who is her primary cardiologist. He will plan to speak to the patient tomorrow about her status.  For questions or updates, please contact Candelaria Arenas Please consult [email or web address] for contact info under        Signed, Buford Dresser, MD  12/30/2019, 2:37 PM

## 2019-12-30 NOTE — Progress Notes (Signed)
Patient is more alert and as talking to RN, I let patient know that she should try to get some rest now. Will continue to monitor patient.

## 2019-12-30 NOTE — Significant Event (Signed)
Rapid Response Event Note  Overview:Called originally at 2307 d/t pt SpO2 dropping to 83% on RA. At that time, pt was placed on NRB with SpO2 increasing to 99%. Per RN, pt was alert and oriented, SOB. RT assessed pt and heard crackles in R lung. RN then notified NP and a PCXR was ordered. At this time, RRT intervention was not needed per RN. RRT called back at 0039 d/t pt pulling off NRB and SpO2 only 88% after replacing it. NP had again been notified-duoneb, ABG, and bipap ordered.   Initial Focused Assessment: Pt laying in bed, confused, anxious, pulling at EKG leads, gown. Pt able to answer "yes" when asked if she was SOB but could not speak other than that. Pt was able to follow simple commands if asked repeatedly. Pt tachypneic, using accessory muscles to breathe.  Lungs diminished on L and wheezy/crackly on R. Skin warm and diaphoretic. T-97.6, HR-68(AV paced), BP-143/74, RR-26, SpO2-87% on NRB.  Interventions: EKG-A-V paced ABG>7.20/74.1/64.5/28.3 Bipap Plan of Care (if not transferred): Pt placed on .70 Bipap with SpO2 increasing to 93%, distress better, and pt less anxious.  Continue to monitor pt closely. Called RRT if further assistance needed.  Event Summary:  [place] notified PTA RRT  Called: 2307 and 0039 Arrived: 0044 Ended:  [name]

## 2019-12-30 NOTE — Progress Notes (Signed)
Spoke with patients daughter again and updated her on patients status.

## 2019-12-30 NOTE — Progress Notes (Signed)
Patient is awake oriented HOH Off Bipap o2 sat 93% on 3 L [place]  brushing teeth. Will feed in 1 hour post bipap removal

## 2019-12-31 DIAGNOSIS — I5043 Acute on chronic combined systolic (congestive) and diastolic (congestive) heart failure: Secondary | ICD-10-CM | POA: Diagnosis not present

## 2019-12-31 LAB — BASIC METABOLIC PANEL
Anion gap: 9 (ref 5–15)
BUN: 30 mg/dL — ABNORMAL HIGH (ref 8–23)
CO2: 30 mmol/L (ref 22–32)
Calcium: 9.2 mg/dL (ref 8.9–10.3)
Chloride: 103 mmol/L (ref 98–111)
Creatinine, Ser: 1.67 mg/dL — ABNORMAL HIGH (ref 0.44–1.00)
GFR calc Af Amer: 30 mL/min — ABNORMAL LOW (ref >60)
GFR calc non Af Amer: 26 mL/min — ABNORMAL LOW (ref >60)
Glucose, Bld: 121 mg/dL — ABNORMAL HIGH (ref 70–99)
Potassium: 4.7 mmol/L (ref 3.5–5.1)
Sodium: 142 mmol/L (ref 135–145)

## 2019-12-31 LAB — PROTIME-INR
INR: 3.7 — ABNORMAL HIGH (ref 0.8–1.2)
Prothrombin Time: 36.5 seconds — ABNORMAL HIGH (ref 11.4–15.2)

## 2019-12-31 NOTE — Plan of Care (Signed)
  Problem: Education: Goal: Ability to demonstrate management of disease process will improve Outcome: Progressing   Problem: Education: Goal: Ability to verbalize understanding of medication therapies will improve Outcome: Progressing   

## 2019-12-31 NOTE — Progress Notes (Signed)
Progress Note  Patient Name: [name] Date of Encounter: 12/31/2019  Primary Cardiologist:   Minus Breeding, MD   Subjective   The patient is very hard of hearing and has dementia.  She is not in distress and there were no acute events last night.    Inpatient Medications    Scheduled Meds: . amiodarone  200 mg Oral Daily  . atorvastatin  5 mg Oral QHS  . brimonidine  1 drop Both Eyes BID  . famotidine  20 mg Oral Daily  . furosemide  80 mg Intravenous Q12H  . latanoprost  1 drop Both Eyes QHS  . levothyroxine  25 mcg Oral QAC breakfast  . mirtazapine  7.5 mg Oral QHS  . olopatadine  1 drop Both Eyes q morning - 10a  . potassium chloride  40 mEq Oral BID  . sodium chloride flush  3 mL Intravenous Q12H  . Warfarin - Pharmacist Dosing Inpatient   Does not apply q1600   Continuous Infusions: . sodium chloride     PRN Meds: sodium chloride, acetaminophen, ipratropium-albuterol, ondansetron (ZOFRAN) IV, sodium chloride flush   Vital Signs    Vitals:   12/31/19 0400 12/31/19 0500 12/31/19 0809 12/31/19 0812  BP: (!) 109/52 (!) 107/48 130/60 130/60  Pulse: 61 61 61   Resp:  18  18  Temp:  97.8 F (36.6 C)  97.6 F (36.4 C)  TempSrc:    Oral  SpO2:  100%    Weight:  52.2 kg    Height:        Intake/Output Summary (Last 24 hours) at 12/31/2019 1048 Last data filed at 12/31/2019 0800 Gross per 24 hour  Intake 1056 ml  Output 800 ml  Net 256 ml   Filed Weights   12/29/19 1408 12/30/19 0434 12/31/19 0500  Weight: 53.6 kg 54.1 kg 52.2 kg    Telemetry    Paced rhythm - Personally Reviewed  ECG    NA - Personally Reviewed  Physical Exam   GEN: No acute distress.   Neck: No  JVD Cardiac: RRR, no murmurs, rubs, or gallops.  Respiratory: Decreased breath sounds GI: Soft, nontender, non-distended  MS: No  edema; No deformity. Neuro:  Hard of hearing but non focal Psych:   Confused but pleasant  Labs    Chemistry Recent Labs  Lab 12/29/19 0551  12/30/19 0238 12/31/19 0351  NA 147* 144 142  K 3.4* 4.1 4.7  CL 105 106 103  CO2 30 25 30   GLUCOSE 107* 159* 121*  BUN 27* 31* 30*  CREATININE 1.50* 1.75* 1.67*  CALCIUM 8.7* 9.3 9.2  GFRNONAA 30* 24* 26*  GFRAA 34* 28* 30*  ANIONGAP 12 13 9      Hematology Recent Labs  Lab 12/28/19 1737 12/29/19 0551  WBC 7.1 6.2  RBC 3.61* 3.38*  HGB 11.4* 11.1*  HCT 38.2 35.5*  MCV 105.8* 105.0*  MCH 31.6 32.8  MCHC 29.8* 31.3  RDW 13.2 13.2  PLT 150 144*    Cardiac EnzymesNo results for input(s): TROPONINI in the last 168 hours. No results for input(s): TROPIPOC in the last 168 hours.   BNP Recent Labs  Lab 12/28/19 1938  BNP 2,019.9*     DDimer No results for input(s): DDIMER in the last 168 hours.   Radiology    DG CHEST PORT 1 VIEW  Result Date: 12/29/2019 CLINICAL DATA:  Shortness of breath EXAM: PORTABLE CHEST 1 VIEW COMPARISON:  12/28/2019 FINDINGS: Cardiomegaly with left chest multi  lead pacer. Small layering right pleural effusion, similar to prior examination. The visualized skeletal structures are unremarkable. IMPRESSION: Cardiomegaly with a small, layering right pleural effusion, similar to prior examination. There is no new or focal airspace opacity. Electronically Signed   By: Eddie Candle M.D.   On: 12/29/2019 23:31    Cardiac Studies   ECHO:  Last echo 01/2019 1. The left ventricle has severely reduced systolic function, with an  ejection fraction of 20-25%. The cavity size was mildly dilated. Left  ventricular diastolic Doppler parameters are consistent with restrictive  filling. Elevated mean left atrial  pressure There is abnormal septal motion consistent with left bundle  branch block. Left ventrical global hypokinesis without regional wall  motion abnormalities.  2. Right ventricular systolic pressure is moderately elevated with an  estimated pressure of 55 mmHg.  3. There is mild mitral annular calcification present. The MR jet is    centrally-directed.  4. Tricuspid valve regurgitation is mild-moderate.  5. Mild thickening of the aortic valve Sclerosis without any evidence of  stenosis of the aortic valve. Aortic valve regurgitation is trivial by  color flow Doppler.  6. Left atrial size was severely dilated.  7. Right atrial size was mildly dilated.  8. When compared to the prior study: Previous images from 2009 are not  available for comparison, but reported normal LVEF 55-60%. Studies from  2007 reported LVEF 35-40%.   Patient Profile     86 y.o. female with a hx of tachybradycardia syndrome s/p pacemaker1/2013complicated by atrial lead dislodgment s/p revision, paroxysmal atrial fibrillation, hypothyroidism, chronic combined CHF, CKD stage III-IV, hypertension, hyperlipidemia and dementiawho is being followed for the evaluation ofCHFat the request of Dr. Erlinda Hong.  Assessment & Plan    ACUTE ON CHRONIC SYSTOLIC AND DIASTOLIC HF:   I spoke with her daughter today.  She has end stage HR and advanced age with frequent hospitalizations.  Palliative care is appropriate and her daughter agrees to this.  I some with Dr. Erlinda Hong and she agrees.  Continue IV diuresis today.   STAGE III/IV:  Follow.  Creat is stable.   For questions or updates, please contact [place] Please consult [email or web address] for contact info under Cardiology/STEMI.   Signed, Minus Breeding, MD  12/31/2019, 10:48 AM

## 2019-12-31 NOTE — Progress Notes (Signed)
[place] for warfarin Indication: atrial fibrillation  No Known Allergies  Patient Measurements: Height: 5' (152.4 cm) Weight: 52.2 kg (115 lb 1.3 oz) IBW/kg (Calculated) : 45.5  Vital Signs: Temp: 97.6 F (36.4 C) (04/18 0812) Temp Source: Oral (04/18 0812) BP: 130/60 (04/18 0812) Pulse Rate: 61 (04/18 0809)  Labs: Recent Labs    12/28/19 1737 12/28/19 1913 12/28/19 1943 12/29/19 0551 12/30/19 0238 12/31/19 0351  HGB 11.4*  --   --  11.1*  --   --   HCT 38.2  --   --  35.5*  --   --   PLT 150  --   --  144*  --   --   LABPROT  --   --    < > 31.1* 31.2* 36.5*  INR  --   --    < > 3.0* 3.0* 3.7*  CREATININE 1.72*  --    < > 1.50* 1.75* 1.67*  TROPONINIHS 10 10  --   --   --   --    < > = values in this interval not displayed.    Estimated Creatinine Clearance: 14.8 mL/min (A) (by C-G formula based on SCr of 1.67 mg/dL (H)).   Medical History: Past Medical History:  Diagnosis Date  . Acute on chronic diastolic (congestive) heart failure ([place]) 01/22/2019  . Acute on chronic systolic CHF (congestive heart failure) ([place]) 03/04/2018  . Anemia   . Arthritis    "knees especially"  . Bradycardia   . Cardiomyopathy    presumed ischemic in the past with an EF of 35%. The most recent EF in 03-2008, however, was 55 -60%  . CHF 12/28/2007   Qualifier: Diagnosis of  By: Laney Potash, [name]    . Chronic anticoagulation 12/15/2015  . Chronic renal insufficiency   . Dementia ([place])    mild  . Dyslipidemia   . Encounter for therapeutic drug monitoring 12/25/2013  . Glaucoma   . Hypertension   . Malleolar fracture   . Pacemaker infection ([place]) 08/18/2012  . Pacemaker- [employer] 12/25/2011   DOI 1/13   . PAF (paroxysmal atrial fibrillation) ([place]) 04/19/2017  . Paroxysmal atrial fibrillation (HCC)   . Sinus node dysfunction ([place]) 12/28/2007   Qualifier: Diagnosis of  By: Laney Potash, [name]     . Tachycardia-bradycardia syndrome ([place]) 09/19/2011    Assessment: 27 YOF presenting with with SOB, on warfarin PTA for Afib. PTA dosing: 2mg  daily, except 1mg  on Sundays.   INR today jumped from 3 to 3.7. Renal function is around baseline estimated CrCl ~15 ml/min. No bleeding noted. Increase in INR could be secondary to decreased meal consumption or decreased warfarin clearance.  Goal of Therapy:  INR 2-3 Monitor platelets by anticoagulation protocol: Yes   Plan:  Hold warfarin today Daily INR, s/s bleeding Will check a CBC for tomorrow   Thank you,   Eddie Candle, PharmD PGY-1 Pharmacy Resident   Please check amion for clinical pharmacist contact number

## 2019-12-31 NOTE — Progress Notes (Signed)
PROGRESS NOTE  [name] HYW:[id number] DOB: 1923/02/06 DOA: 4/85/4627 PCP: Chriss Czar, MD  HPI/Recap of past 24 hours:  Uneventful night, she is on 3liter oxygen Hard of hearing, denies chest pain 1liter urine output documented last 24h hrs  Assessment/Plan: Principal Problem:   Acute on chronic combined systolic and diastolic congestive heart failure (HCC) Active Problems:   Dyslipidemia   Hypertension   PAF (paroxysmal atrial fibrillation) (HCC)   Hypothyroidism   Acute kidney injury superimposed on CKD ([place])   CHF (congestive heart failure) ([place])  Acute on chronic combined chf with hypotension EF 20% Failed outpatient lasix therapy Currently on iv lasix  Had respiratory stress event on 4/16 evening required bipap briefly, currently she is on 3liters at rest Cardiology consulted, input appreciated,  Palliative care consulted due to end stage chf  Paroxysmal atrial fibrillation/Tachybradycardia syndrome s/p PPM: In paced rhythm with controlled rate on admission.  Continue amiodarone and Coumadin per pharmacy.  Holding metoprolol due to hypotension.  HTN Now hypotensive hold Lopressor /lisinopril  CKD 3b/anemia of chronic disease hgb  11 Monitor renal function while on Lasix and having hypotension Hold lisinopril  Macrocytosis, mcv 105,   B12/folate unremarkable  Hypokalemia, replaced and normalized  Hypothyroidism: Continue Synthroid.  Hyperlipidemia: Continue rosuvastatin.  Body mass index is 22.48 kg/m.   DVT Prophylaxis: Coumadin  Code Status: full  Family Communication: patient , daughter over the phone with permission on 4/17  Disposition Plan:    Patient came from:          home alone     , but daughter states can arrange 24/7 care at home if needs be                                                                                           Anticipated d/c place: home   Barriers to d/c OR conditions which need to be met to  effect a safe d/c: needs cardiology clearance, needs palliative care input   Consultants:  Cardiology  Palliative care  Procedures:  none  Antibiotics:  none   Objective: BP 130/60 (BP Location: Right Arm)   Pulse 61   Temp 97.6 F (36.4 C) (Oral)   Resp 18   Ht 5' (1.524 m)   Wt 52.2 kg   SpO2 100%   BMI 22.48 kg/m   Intake/Output Summary (Last 24 hours) at 12/31/2019 1106 Last data filed at 12/31/2019 0800 Gross per 24 hour  Intake 1056 ml  Output 800 ml  Net 256 ml   Filed Weights   12/29/19 1408 12/30/19 0434 12/31/19 0500  Weight: 53.6 kg 54.1 kg 52.2 kg    Exam: Patient is examined daily including today on 12/31/2019, exams remain the same as of yesterday except that has changed    General:  Alert, awake , pleasant, hard of hearing  Cardiovascular: Paced rhythm  Respiratory: Crackles in the bases  Abdomen: Soft/ND/NT, positive BS  Musculoskeletal: No Edema  Neuro: alert, oriented   Data Reviewed: Basic Metabolic Panel: Recent Labs  Lab 12/28/19 1737 12/28/19 2101 12/29/19 0551 12/30/19 0238 12/31/19 0351  NA 142  --  147* 144 142  K 3.3*  --  3.4* 4.1 4.7  CL 101  --  105 106 103  CO2 30  --  _0 GLUCOSE 156*  --  107* 159* 121*  BUN 29*  --  27* 31* 30*  CREATININE 1.72*  --  1.50* 1.75* 1.67*  CALCIUM 9.2  --  8.7* 9.3 9.2  MG  --  2.0 2.1 2.1  --    Liver Function Tests: No results for input(s): AST, ALT, ALKPHOS, BILITOT, PROT, ALBUMIN in the last 168 hours. No results for input(s): LIPASE, AMYLASE in the last 168 hours. No results for input(s): AMMONIA in the last 168 hours. CBC: Recent Labs  Lab 12/28/19 1737 12/29/19 0551  WBC 7.1 6.2  HGB 11.4* 11.1*  HCT 38.2 35.5*  MCV 105.8* 105.0*  PLT 150 144*   Cardiac Enzymes:   No results for input(s): CKTOTAL, CKMB, CKMBINDEX, TROPONINI in the last 168 hours. BNP (last 3 results) Recent Labs    01/20/19 1407 12/28/19 1938  BNP 2,283.5* 2,019.9*    ProBNP  (last 3 results) No results for input(s): PROBNP in the last 8760 hours.  CBG: No results for input(s): GLUCAP in the last 168 hours.  Recent Results (from the past 240 hour(s))  Respiratory Panel by RT PCR (Flu A&B, Covid) - Nasopharyngeal Swab     Status: None   Collection Time: 12/28/19  7:57 PM   Specimen: Nasopharyngeal Swab  Result Value Ref Range Status   SARS Coronavirus 2 by RT PCR NEGATIVE NEGATIVE Final    Comment: (NOTE) SARS-CoV-2 target nucleic acids are NOT DETECTED. The SARS-CoV-2 RNA is generally detectable in upper respiratoy specimens during the acute phase of infection. The lowest concentration of SARS-CoV-2 viral copies this assay can detect is 131 copies/mL. A negative result does not preclude SARS-Cov-2 infection and should not be used as the sole basis for treatment or other patient management decisions. A negative result may occur with  improper specimen collection/handling, submission of specimen other than nasopharyngeal swab, presence of viral mutation(s) within the areas targeted by this assay, and inadequate number of viral copies (<131 copies/mL). A negative result must be combined with clinical observations, patient history, and epidemiological information. The expected result is Negative. Fact Sheet for Patients:  PinkCheek.be Fact Sheet for Healthcare Providers:  GravelBags.it This test is not yet ap proved or cleared by the [place] FDA and  has been authorized for detection and/or diagnosis of SARS-CoV-2 by FDA under an Emergency Use Authorization (EUA). This EUA will remain  in effect (meaning this test can be used) for the duration of the COVID-19 declaration under Section 564(b)(1) of the Act, 21 U.S.C. section 360bbb-3(b)(1), unless the authorization is terminated or revoked sooner.    Influenza A by PCR NEGATIVE NEGATIVE Final   Influenza B by PCR NEGATIVE NEGATIVE Final     Comment: (NOTE) The Xpert Xpress SARS-CoV-2/FLU/RSV assay is intended as an aid in  the diagnosis of influenza from Nasopharyngeal swab specimens and  should not be used as a sole basis for treatment. Nasal washings and  aspirates are unacceptable for Xpert Xpress SARS-CoV-2/FLU/RSV  testing. Fact Sheet for Patients: PinkCheek.be Fact Sheet for Healthcare Providers: GravelBags.it This test is not yet approved or cleared by the [place] FDA and  has been authorized for detection and/or diagnosis of SARS-CoV-2 by  FDA under an Emergency Use Authorization (EUA). This EUA will remain  in effect (meaning this test can be  used) for the duration of the  Covid-19 declaration under Section 564(b)(1) of the Act, 21  U.S.C. section 360bbb-3(b)(1), unless the authorization is  terminated or revoked. Performed at [facility], [place] [address] [place], [place] [address]      Studies: No results found.  Scheduled Meds: . amiodarone  200 mg Oral Daily  . atorvastatin  5 mg Oral QHS  . brimonidine  1 drop Both Eyes BID  . famotidine  20 mg Oral Daily  . furosemide  80 mg Intravenous Q12H  . latanoprost  1 drop Both Eyes QHS  . levothyroxine  25 mcg Oral QAC breakfast  . mirtazapine  7.5 mg Oral QHS  . olopatadine  1 drop Both Eyes q morning - 10a  . potassium chloride  40 mEq Oral BID  . sodium chloride flush  3 mL Intravenous Q12H  . Warfarin - Pharmacist Dosing Inpatient   Does not apply q1600    Continuous Infusions: . sodium chloride       Time spent: 64mns, case discussed with cardiology I have personally reviewed and interpreted on  12/31/2019 daily labs, tele strips, imagings as discussed above under date review session and assessment and plans.  I reviewed all nursing notes, pharmacy notes, consultant notes,  vitals, pertinent old records  I have discussed plan of care as described above with RN , patient on  12/31/2019   FFlorencia ReasonsMD, PhD, FACP  [facility]  Available via Epic secure chat 7am-7pm for nonurgent issues Please page for urgent issues, pager number available through [email or web address] .   12/31/2019, 11:06 AM  LOS: 2 days

## 2019-12-31 NOTE — Plan of Care (Signed)
  Problem: Education: Goal: Ability to demonstrate management of disease process will improve Outcome: Progressing   Problem: Education: Goal: Ability to verbalize understanding of medication therapies will improve Outcome: Progressing   Problem: Activity: Goal: Capacity to carry out activities will improve Outcome: Progressing   Problem: Clinical Measurements: Goal: Respiratory complications will improve Outcome: Progressing

## 2020-01-01 DIAGNOSIS — Z7189 Other specified counseling: Secondary | ICD-10-CM

## 2020-01-01 DIAGNOSIS — I5043 Acute on chronic combined systolic (congestive) and diastolic (congestive) heart failure: Secondary | ICD-10-CM | POA: Diagnosis not present

## 2020-01-01 DIAGNOSIS — I10 Essential (primary) hypertension: Secondary | ICD-10-CM | POA: Diagnosis not present

## 2020-01-01 DIAGNOSIS — Z515 Encounter for palliative care: Secondary | ICD-10-CM

## 2020-01-01 DIAGNOSIS — N189 Chronic kidney disease, unspecified: Secondary | ICD-10-CM | POA: Diagnosis not present

## 2020-01-01 DIAGNOSIS — N179 Acute kidney failure, unspecified: Secondary | ICD-10-CM | POA: Diagnosis not present

## 2020-01-01 LAB — BASIC METABOLIC PANEL
Anion gap: 12 (ref 5–15)
BUN: 30 mg/dL — ABNORMAL HIGH (ref 8–23)
CO2: 30 mmol/L (ref 22–32)
Calcium: 9.7 mg/dL (ref 8.9–10.3)
Chloride: 100 mmol/L (ref 98–111)
Creatinine, Ser: 1.59 mg/dL — ABNORMAL HIGH (ref 0.44–1.00)
GFR calc Af Amer: 32 mL/min — ABNORMAL LOW (ref >60)
GFR calc non Af Amer: 28 mL/min — ABNORMAL LOW (ref >60)
Glucose, Bld: 108 mg/dL — ABNORMAL HIGH (ref 70–99)
Potassium: 4.4 mmol/L (ref 3.5–5.1)
Sodium: 142 mmol/L (ref 135–145)

## 2020-01-01 LAB — CBC
HCT: 38.6 % (ref 36.0–46.0)
Hemoglobin: 12.2 g/dL (ref 12.0–15.0)
MCH: 32.7 pg (ref 26.0–34.0)
MCHC: 31.6 g/dL (ref 30.0–36.0)
MCV: 103.5 fL — ABNORMAL HIGH (ref 80.0–100.0)
Platelets: 155 10*3/uL (ref 150–400)
RBC: 3.73 MIL/uL — ABNORMAL LOW (ref 3.87–5.11)
RDW: 13.2 % (ref 11.5–15.5)
WBC: 9.1 10*3/uL (ref 4.0–10.5)
nRBC: 0 % (ref 0.0–0.2)

## 2020-01-01 LAB — PROTIME-INR
INR: 3.9 — ABNORMAL HIGH (ref 0.8–1.2)
Prothrombin Time: 38.2 seconds — ABNORMAL HIGH (ref 11.4–15.2)

## 2020-01-01 MED ORDER — AMIODARONE HCL 200 MG PO TABS: 200.0000 mg | ORAL_TABLET | ORAL | Status: DC

## 2020-01-01 MED ORDER — FUROSEMIDE 40 MG PO TABS
40.0000 mg | ORAL_TABLET | Freq: Every evening | ORAL | Status: DC
  Administered 2020-01-01: 40 mg via ORAL
  Filled 2020-01-01: qty 1

## 2020-01-01 MED ORDER — FUROSEMIDE 10 MG/ML IJ SOLN: 80.0000 mg | Freq: Every day | INTRAMUSCULAR | Status: DC

## 2020-01-01 MED ORDER — FUROSEMIDE 80 MG PO TABS
80.0000 mg | ORAL_TABLET | Freq: Every day | ORAL | Status: DC
  Administered 2020-01-02: 80 mg via ORAL
  Filled 2020-01-01: qty 1

## 2020-01-01 NOTE — Progress Notes (Signed)
Progress Note  Patient Name: [name] Date of Encounter: 01/01/2020  Primary Cardiologist:   Minus Breeding, MD  Subjective   She states that she feels better today, no SOB.  Inpatient Medications    Scheduled Meds: . amiodarone  200 mg Oral Daily  . atorvastatin  5 mg Oral QHS  . brimonidine  1 drop Both Eyes BID  . famotidine  20 mg Oral Daily  . furosemide  80 mg Intravenous Q12H  . latanoprost  1 drop Both Eyes QHS  . levothyroxine  25 mcg Oral QAC breakfast  . mirtazapine  7.5 mg Oral QHS  . olopatadine  1 drop Both Eyes q morning - 10a  . potassium chloride  40 mEq Oral BID  . sodium chloride flush  3 mL Intravenous Q12H  . Warfarin - Pharmacist Dosing Inpatient   Does not apply q1600   Continuous Infusions: . sodium chloride     PRN Meds: sodium chloride, acetaminophen, ipratropium-albuterol, ondansetron (ZOFRAN) IV, sodium chloride flush   Vital Signs    Vitals:   01/01/20 0001 01/01/20 0400 01/01/20 0523 01/01/20 0721  BP: (!) 122/58 (!) 113/54    Pulse: (!) 59 69  (!) 59  Resp: 18 17  18   Temp: (!) 97.3 F (36.3 C) 97.6 F (36.4 C)  97.6 F (36.4 C)  TempSrc: Oral Axillary  Oral  SpO2: 100% 99%  100%  Weight:   52.7 kg   Height:        Intake/Output Summary (Last 24 hours) at 01/01/2020 1153 Last data filed at 01/01/2020 0942 Gross per 24 hour  Intake 1061 ml  Output 1800 ml  Net -739 ml   Filed Weights   12/30/19 0434 12/31/19 0500 01/01/20 0523  Weight: 54.1 kg 52.2 kg 52.7 kg   Telemetry    Paced rhythm - Personally Reviewed  ECG    NA - Personally Reviewed  Physical Exam   GEN: No acute distress.   Neck: No  JVD Cardiac: RRR, no murmurs, rubs, or gallops.  Respiratory: Decreased breath sounds GI: Soft, nontender, non-distended  MS: No  edema; No deformity. Neuro:  Hard of hearing but non focal Psych:   Confused but pleasant  Labs    Chemistry Recent Labs  Lab 12/30/19 0238 12/31/19 0351 01/01/20 0436  NA 144  142 142  K 4.1 4.7 4.4  CL 106 103 100  CO2 25 30 30   GLUCOSE 159* 121* 108*  BUN 31* 30* 30*  CREATININE 1.75* 1.67* 1.59*  CALCIUM 9.3 9.2 9.7  GFRNONAA 24* 26* 28*  GFRAA 28* 30* 32*  ANIONGAP 13 9 12      Hematology Recent Labs  Lab 12/28/19 1737 12/29/19 0551  WBC 7.1 6.2  RBC 3.61* 3.38*  HGB 11.4* 11.1*  HCT 38.2 35.5*  MCV 105.8* 105.0*  MCH 31.6 32.8  MCHC 29.8* 31.3  RDW 13.2 13.2  PLT 150 144*    Cardiac EnzymesNo results for input(s): TROPONINI in the last 168 hours. No results for input(s): TROPIPOC in the last 168 hours.   BNP Recent Labs  Lab 12/28/19 1938  BNP 2,019.9*     DDimer No results for input(s): DDIMER in the last 168 hours.   Radiology    No results found.  Cardiac Studies   ECHO:  Last echo 01/2019 1. The left ventricle has severely reduced systolic function, with an  ejection fraction of 20-25%. The cavity size was mildly dilated. Left  ventricular diastolic Doppler parameters are  consistent with restrictive  filling. Elevated mean left atrial  pressure There is abnormal septal motion consistent with left bundle  branch block. Left ventrical global hypokinesis without regional wall  motion abnormalities.  2. Right ventricular systolic pressure is moderately elevated with an  estimated pressure of 55 mmHg.  3. There is mild mitral annular calcification present. The MR jet is  centrally-directed.  4. Tricuspid valve regurgitation is mild-moderate.  5. Mild thickening of the aortic valve Sclerosis without any evidence of  stenosis of the aortic valve. Aortic valve regurgitation is trivial by  color flow Doppler.  6. Left atrial size was severely dilated.  7. Right atrial size was mildly dilated.  8. When compared to the prior study: Previous images from 2009 are not  available for comparison, but reported normal LVEF 55-60%. Studies from  2007 reported LVEF 35-40%.     Patient Profile     86 y.o. female with a hx  of tachybradycardia syndrome s/p pacemaker1/2013complicated by atrial lead dislodgment s/p revision, paroxysmal atrial fibrillation, hypothyroidism, chronic combined CHF, CKD stage III-IV, hypertension, hyperlipidemia and dementiawho is being followed for the evaluation ofCHFat the request of Dr. Erlinda Hong.  Assessment & Plan    ACUTE ON CHRONIC SYSTOLIC AND DIASTOLIC HF:    She has end stage HR and advanced age with frequent hospitalizations.  Palliative care is appropriate and her daughter agreed to this yesterday.  I some with Dr. Erlinda Hong and she agrees.  Switch to PO lasix 80 mg in the am and 40 mg PO in the afternoons.   STAGE III/IV:  Follow.  Creat is improving.  [facility] will sign off.   Medication Recommendations:  As above Other recommendations (labs, testing, etc):  No further testing  Follow up as an outpatient:  As needed  For questions or updates, please contact [place] Please consult [email or web address] for contact info under Cardiology/STEMI.   Signed, Ena Dawley, MD  01/01/2020, 11:53 AM

## 2020-01-01 NOTE — Plan of Care (Signed)

## 2020-01-01 NOTE — TOC Transition Note (Signed)
Transition of Care [facility]) - CM/SW Discharge Note   Patient Details  Name: [name] MRN: [id number] Date of Birth: [birth year]  [facility] CM/SW Contact:  Zenon Mayo, RN Phone Number: 01/01/2020, 9:28 AM   Clinical Narrative:    NCM spoke with daughter sybil, she states patient lives in a elderly retirement apartment , she has two aides.  [name] states her brother also lives in [place].  [name] states states she stays over with patient some nights.  Patient has walker, lift chair, bsc, and a shower seat at home,  NCM offered choice to [place] from medicare.gov list, for [facility] and HH[facility], she states lets try [facility] first and if they can not staff referral then she does not have a preference. NCM made referral to [facility] with [facility], she states they are able to take referral with soc on 4/22, fax number is [phone].      Final next level of care: [place] Barriers to Discharge: No Barriers Identified   Patient Goals and CMS Choice Patient states their goals for this hospitalization and ongoing recovery are:: get better CMS Medicare.gov Compare Post Acute Care list provided to:: Patient Represenative (must comment) Choice offered to / list presented to : Adult Children  Discharge Placement                       Discharge Plan and Services                  DME Agency: NA       HH Arranged: RN, Disease Management, PT [place] Agency: [place] Date Ward: 01/01/20 Time [place]: [phone] Representative spoke with at [place]: [place] ([place]) Interventions     Readmission Risk Interventions No flowsheet data found.

## 2020-01-01 NOTE — Progress Notes (Signed)
Physical Therapy Treatment Patient Details Name: [name] MRN: [id number] DOB: Oct 22, 1922 Today's Date: 01/01/2020    History of Present Illness Pt is a 86 y/o female c/o progressive SOB x2 weeks, ultimately referred to ED by [facility] RN and OP Cardiology. Covid negative. Admitted due to acute on chronic CHF exacerbation. PMH CHF, cardiomyopathy, chronic anticoagulation, renal insufficiency, dementia, HTN, dyslipidemia, hx ankle fracture, tachy-brady syndrome s/p PPM and pace maker revision    PT Comments    Pt agreeable to working with therapy and "getting out of the room." Pt is limited in safe mobility by generalized weakness and decreased endurance. Pt is min guard for bed mobility, min A for transfers and min A for ambulation of 120 feet with RW. Pt able to stand at sink for ~8 minutes without outside physical assist. D/c plans remain appropriate at this time. PT will continue to follow acutely.     Follow Up Recommendations  Home health PT;Supervision/Assistance - 24 hour     Equipment Recommendations  None recommended by PT       Precautions / Restrictions Precautions Precautions: Fall Precaution Comments: very HOH Restrictions Weight Bearing Restrictions: No    Mobility  Bed Mobility Overal bed mobility: Needs Assistance Bed Mobility: Supine to Sit     Supine to sit: Min guard     General bed mobility comments: HOB elevated, extended time and use of bed rails  Transfers Overall transfer level: Needs assistance Equipment used: Rolling walker (2 wheeled) Transfers: Sit to/from Stand Sit to Stand: Min assist         General transfer comment: min A for power up and steadying in standing  Ambulation/Gait Ambulation/Gait assistance: Min guard;Min assist Gait Distance (Feet): 120 Feet Assistive device: Rolling walker (2 wheeled) Gait Pattern/deviations: Step-through pattern;Trunk flexed Gait velocity: decreased Gait velocity interpretation: <1.8 ft/sec,  indicate of risk for recurrent falls General Gait Details: min A progressing to min guard for RW navigation with multimodal cues for proximity to RW         Balance Overall balance assessment: Needs assistance Sitting-balance support: Feet supported Sitting balance-Leahy Scale: Good     Standing balance support: During functional activity;Bilateral upper extremity supported;Single extremity supported Standing balance-Leahy Scale: Poor                              Cognition Arousal/Alertness: Awake/alert Behavior During Therapy: WFL for tasks assessed/performed Overall Cognitive Status: Difficult to assess Area of Impairment: Following commands;Safety/judgement;Problem solving                       Following Commands: Follows one step commands with increased time;Follows one step commands inconsistently Safety/Judgement: Decreased awareness of deficits;Decreased awareness of safety   Problem Solving: Slow processing;Decreased initiation;Difficulty sequencing;Requires verbal cues        Exercises Other Exercises Other Exercises: stood at sink and brushed teeth with single and bilateral UE support    General Comments General comments (skin integrity, edema, etc.): Pt on 2L O2 via [place], difficulty with attaining SaO2 with good pleth waveform seen in 90s but measurement spotty       Pertinent Vitals/Pain Pain Assessment: No/denies pain           PT Goals (current goals can now be found in the care plan section) Acute Rehab PT Goals PT Goal Formulation: With patient Time For Goal Achievement: 01/12/20 Potential to Achieve Goals: Good Progress towards PT goals: Progressing  toward goals    Frequency    Min 3X/week      PT Plan Current plan remains appropriate       AM-PAC PT "6 Clicks" Mobility   Outcome Measure  Help needed turning from your back to your side while in a flat bed without using bedrails?: None Help needed moving from lying  on your back to sitting on the side of a flat bed without using bedrails?: None Help needed moving to and from a bed to a chair (including a wheelchair)?: A Little Help needed standing up from a chair using your arms (e.g., wheelchair or bedside chair)?: A Little Help needed to walk in hospital room?: A Little Help needed climbing 3-5 steps with a railing? : A Lot 6 Click Score: 19    End of Session Equipment Utilized During Treatment: Gait belt Activity Tolerance: Patient tolerated treatment well Patient left: with call bell/phone within reach;with bed alarm set;in bed Nurse Communication: Mobility status PT Visit Diagnosis: Muscle weakness (generalized) (M62.81)     Time: 0973-5329 PT Time Calculation (min) (ACUTE ONLY): 35 min  Charges:  $Gait Training: 8-22 mins $Therapeutic Activity: 8-22 mins                     Bailey Kolbe B. Migdalia Dk PT, DPT Acute Rehabilitation Services Pager [phone] Office [phone]    [place] 01/01/2020, 4:50 PM

## 2020-01-01 NOTE — TOC Progression Note (Addendum)
Transition of Care [facility]) - Progression Note    Patient Details  Name: [name] MRN: [id number] Date of Birth: 05/02/1923  Transition of Care [facility]) CM/SW Contact  Zenon Mayo, RN Phone Number: 01/01/2020, 9:13 AM  Clinical Narrative:    NCM spoke with daughter sybil, she states patient lives in a elderly retirement apartment , she has two aides.  [name] states her brother also lives in [place].  [name] states states she stays over with patient some nights.  Patient has walker, lift chair, bsc, and a shower seat at home,  NCM offered choice to [place] from medicare.gov list, for [facility] and HHPT, she states lets try [facility] first and if they can not staff referral then she does not have a preference. NCM made referral to [facility], she states they are able to take referral with soc on 4/22, fax number is [phone].          Expected Discharge Plan and Services                                                 Social Determinants of Health (SDOH) Interventions    Readmission Risk Interventions No flowsheet data found.

## 2020-01-01 NOTE — Consult Note (Signed)
Consultation Note Date: 01/01/2020   Patient Name: [name]  DOB: [birth year]  MRN: [id number]  Age / Sex: 86 y.o., female   PCP: Chriss Czar, MD Referring Physician: Shelly Coss, MD   REASON FOR CONSULTATION:Establishing goals of care  Palliative Care consult requested for goals of care discussion in this 86 y.o. female with multiple medical problems including chronic combined systolic and diastolic CHF (EF 75-44%), paroxysmal atrial fibrillation (Coumadin), tachybradycardia syndrome s/p PPM, CKD stage III, hypothyroidism, hypertension, and hyperlipidemia. [name] presented to ED from home with complaints of shortness of breath. Per notation patient has experienced intermittent shortness of breath for the past 2 weeks. Her home lasix was increased to 80 mg twice a day for a week (3 weeks prior) and then decreased back to once daily. During her ED work-up sodium 142, potassium 3.3, BUN 29, CR 1.72. Covid PCR negative. Chest x-ray showed left-sided pacemaker, cardiomegaly, trace right effusion, and pulmonary edema. Patient was given IV lasix at that time. Since admission patient has been evaluated by Cardiology with recommendations for continued IV diuresis, noting patient has end-stage heart failure.   Clinical Assessment and Goals of Care: I have reviewed medical records including lab results, imaging, Epic notes, and MAR, received report from the bedside RN, and assessed the patient. I met at the bedside with [name] and also spoke with her daughter Darryll Capers as requested via phone to discuss diagnosis prognosis, [place], EOL wishes, disposition and options.   I introduced Palliative Medicine as specialized medical care for people living with serious illness. It focuses on providing relief from the symptoms and stress of a serious illness. The goal is to improve quality of life for both the patient and the family. Both patient and daughter verbalized understanding and  appreciation of Palliative's involvement.   We discussed a brief life review of the patient, along with her functional and nutritional status. [name] reports prior to admission she lived alone in her home with caregiver support from 8a-8pm. She is widowed, with 3 children. Her daughter [name], and 2 sons Jeneen Rinks and Hadley Pen (who lives in [place]). 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   Prior to admission she reports requiring some assistance with her ADLs which is why she had caregivers. Her daughter [name] stays at least 4 nights per week and provides needed care with the support of patient's son, Jeneen Rinks. She was ambulatory with a walker for stability. She declined use of home oxygen. Daughter reports patient is hard of hearing however, she refused to wear a hearing aid. Her appetite was generally good with some days not as much.   We discussed Her current illness and what it means in the larger context of Her on-going co-morbidities. Natural disease trajectory and expectations at EOL were discussed.  [name] was able to express why she was hospitalized and her awareness of her illness. She shares that she has been experiencing shortness of breath for several weeks despite changes in her diuretic. She also reports anxiety during the night when laying down due to shortness of breath or inability to take a deep breath. Her daughter verbalizes understanding of her current illness as well. She is appreciative of continued updates by the providers on the team.   I attempted to elicit values and goals of care important to the patient.    The difference between aggressive medical intervention and  comfort care was considered in light of the patient's goals of care. [name] verbalizes understanding of her mother's illness however she remains hopeful for some stability.   Both patient and daughter express goals of patient  returning home with 24/7 caregiver support and amongst family. They would like to continue to treat the treatable with awareness of limitations of medical interventions and available options in the setting of endstage heart failure.   Advanced directives, concepts specific to code status, artifical feeding and hydration, and rehospitalization were considered and discussed. [name] reports [name] does not have a documented advanced directive. They have never had a serious discussion regarding patient's wishes.   I discussed at length patient's full code status with consideration for her current illness and co-morbidities. [name] states she would not want to be place on life-sustaining machines or have tubes responsible for keeping her alive. [name] shares she would like to discuss further with her brothers prior to making a decisions and provide them with updates. Recommendations provided for DNR given patient's frailty and no meaningful recovery or chances for survival in the setting of a cardiopulmonary event. Daughter verbalized understanding and appreciation of discussion.   Hospice and Palliative Care services outpatient were explained and offered. Family verbalized their understanding and awareness of both palliative and hospice's goals and philosophy of care. Daughter is open to outpatient palliative at this time, in the setting of goals for continued medical treatments and interventions.   Questions and concerns were addressed. The family was encouraged to call with questions or concerns.  PMT will continue to support holistically.   SOCIAL HISTORY:     reports that she has never smoked. She has never used smokeless tobacco. She reports that she does not drink alcohol or use drugs.  CODE STATUS: Full code  ADVANCE DIRECTIVES:[name] per patient.    SYMPTOM MANAGEMENT: per attending   Palliative Prophylaxis:   Aspiration, Bowel Regimen, Delirium Protocol, Frequent Pain  Assessment, Oral Care and Turn Reposition  PSYCHO-SOCIAL/SPIRITUAL:  Support System: Family  Desire for further Chaplaincy support: NO   Additional Recommendations (Limitations, Scope, Preferences):  Treat the treatable   PAST MEDICAL HISTORY: Past Medical History:  Diagnosis Date  . Acute on chronic diastolic (congestive) heart failure ([place]) 01/22/2019  . Acute on chronic systolic CHF (congestive heart failure) ([place]) 03/04/2018  . Anemia   . Arthritis    "knees especially"  . Bradycardia   . Cardiomyopathy    presumed ischemic in the past with an EF of 35%. The most recent EF in 03-2008, however, was 55 -60%  . CHF 12/28/2007   Qualifier: Diagnosis of  By: Laney Potash, [name]    . Chronic anticoagulation 12/15/2015  . Chronic renal insufficiency   . Dementia ([place])    mild  . Dyslipidemia   . Encounter for therapeutic drug monitoring 12/25/2013  . Glaucoma   . Hypertension   . Malleolar fracture   . Pacemaker infection ([place]) 08/18/2012  . Pacemaker- Medtronic 12/25/2011   DOI 1/13   . PAF (paroxysmal atrial fibrillation) ([place]) 04/19/2017  . Paroxysmal atrial fibrillation (H[facility])   . Sinus node dysfunction ([place]) 12/28/2007   Qualifier: Diagnosis of  By: Laney Potash, [name]     . Tachycardia-bradycardia syndrome ([place]) 09/19/2011    PAST SURGICAL HISTORY:  Past Surgical History:  Procedure Laterality Date  . ABDOMINAL HYSTERECTOMY    . CARDIOVERSION  07/2011  . EYE SURGERY  2002   "blood clot behind eye"  . FRACTURE SURGERY  ~  2007   RLE  . HEMORRHOID SURGERY    . INSERT / REPLACE / REMOVE PACEMAKER  09/17/11   initial placement  . INSERT / REPLACE / REMOVE PACEMAKER  09/18/11  . PACEMAKER REVISION N/A 09/18/2011   Procedure: PACEMAKER REVISION;  Surgeon: Thompson Grayer, MD;  Location[facility] LAB;  Service: Cardiovascular;  Laterality: N/A;  . PERMANENT PACEMAKER INSERTION N/A 09/17/2011   Procedure: PERMANENT PACEMAKER INSERTION;  Surgeon: Deboraha Sprang, MD;  Location: [facility] CATH LAB;   Service: Cardiovascular;  Laterality: N/A;  . TONSILLECTOMY AND ADENOIDECTOMY      ALLERGIES:  has No Known Allergies.   MEDICATIONS:  Current Facility-Administered Medications  Medication Dose Route Frequency Provider Last Rate Last Admin  . 0.9 %  sodium chloride infusion  250 mL Intravenous PRN Zada Finders R, MD      . acetaminophen (TYLENOL) tablet 650 mg  650 mg Oral Q4H PRN Zada Finders R, MD      . amiodarone (PACERONE) tablet 200 mg  200 mg Oral Daily Zada Finders R, MD   200 mg at 01/01/20 0938  . atorvastatin (LIPITOR) tablet 5 mg  5 mg Oral QHS Zada Finders R, MD   5 mg at 12/31/19 2039  . brimonidine (ALPHAGAN) 0.2 % ophthalmic solution 1 drop  1 drop Both Eyes BID Florencia Reasons, MD   1 drop at 01/01/20 [phone]  . famotidine (PEPCID) tablet 20 mg  20 mg Oral Daily Zada Finders R, MD   20 mg at 01/01/20 0938  . furosemide (LASIX) injection 80 mg  80 mg Intravenous Q12H Buford Dresser, MD   80 mg at 01/01/20 0519  . ipratropium-albuterol (DUONEB) 0.5-2.5 (3) MG/3ML nebulizer solution 3 mL  3 mL Nebulization Q6H PRN Blount, Xenia T, NP      . latanoprost (XALATAN) 0.005 % ophthalmic solution 1 drop  1 drop Both Eyes QHS Florencia Reasons, MD   1 drop at 12/31/19 2044  . levothyroxine (SYNTHROID) tablet 25 mcg  25 mcg Oral QAC breakfast Lenore Cordia, MD   25 mcg at 01/01/20 0519  . mirtazapine (REMERON) tablet 7.5 mg  7.5 mg Oral QHS Florencia Reasons, MD   7.5 mg at 12/31/19 2039  . olopatadine (PATANOL) 0.1 % ophthalmic solution 1 drop  1 drop Both Eyes q morning - 10a Florencia Reasons, MD   1 drop at 01/01/20 [phone]  . ondansetron (ZOFRAN) injection 4 mg  4 mg Intravenous Q6H PRN Zada Finders R, MD      . potassium chloride (KLOR-CON) packet 40 mEq  40 mEq Oral BID Florencia Reasons, MD   40 mEq at 01/01/20 0938  . sodium chloride flush (NS) 0.9 % injection 3 mL  3 mL Intravenous Q12H Lenore Cordia, MD   3 mL at 01/01/20 0942  . sodium chloride flush (NS) 0.9 % injection 3 mL  3 mL Intravenous PRN Lenore Cordia, MD      . Warfarin - Pharmacist Dosing Inpatient   Does not apply q1600 Bertis Ruddy, [facility]   Stopped at 12/31/19 1600    VITAL SIGNS: BP (!) 113/54 (BP Location: Right Arm)   Pulse (!) 59   Temp 97.6 F (36.4 C) (Oral)   Resp 18   Ht 5' (1.524 m)   Wt 52.7 kg   SpO2 100%   BMI 22.69 kg/m  Filed Weights   12/30/19 0434 12/31/19 0500 01/01/20 0523  Weight: 54.1 kg 52.2 kg 52.7 kg    Estimated  body mass index is 22.69 kg/m as calculated from the following:   Height as of this encounter: 5' (1.524 m).   Weight as of this encounter: 52.7 kg.  LABS: CBC:    Component Value Date/Time   WBC 6.2 12/29/2019 0551   HGB 11.1 (L) 12/29/2019 0551   HGB 10.7 (L) 06/30/2019 1112   HCT 35.5 (L) 12/29/2019 0551   HCT 33.4 (L) 06/30/2019 1112   PLT 144 (L) 12/29/2019 0551   PLT 175 06/30/2019 1112   Comprehensive Metabolic Panel:    Component Value Date/Time   NA 142 01/01/2020 0436   NA 145 (H) 09/29/2019 1245   K 4.4 01/01/2020 0436   CO2 30 01/01/2020 0436   BUN 30 (H) 01/01/2020 0436   BUN 30 09/29/2019 1245   CREATININE 1.59 (H) 01/01/2020 0436   CREATININE 1.15 (H) 10/31/2015 1202   ALBUMIN 4.4 09/29/2019 1245     Review of Systems  Constitutional: Positive for fatigue.  Respiratory: Positive for shortness of breath.   Neurological: Positive for weakness.  Unless otherwise noted, a complete review of systems is negative.  Physical Exam General: NAD Cardiovascular: regular rate and rhythm Pulmonary: diminished bilaterally Abdomen: soft, nontender, + bowel sounds Extremities: no edema, no joint deformities Skin: no rashes Neurological: hard of hearing, follows commands, alert and oriented (able to answer all questions appropriately)   Prognosis: Guarded to Poor  Discharge Planning:  Home with Home Health and outpatient palliative   Recommendations:  Full Code-family discussing   Continue current plan of care per medical team  Extensive  discussion regarding quality of life, code status, and poor prognosis with daughter [name] and patient. Daughter remains hopeful for stability and patient to be able to return home with home health RN/PT. They are in agreement with outpatient palliative only at this time.   Encouraged importance of continued family discussions regarding goals of care and code status.   Outpatient palliative support at discharge (referral placed).   PMT will continue to support and follow.    Palliative Performance Scale: PPS 30%               Daughter expressed understanding and was in agreement with this plan.   Thank you for allowing the Palliative Medicine Team to assist in the care of this patient.  Time In: 1125 Time Out: 1230 Time Total: 65 min.   Visit consisted of counseling and education dealing with the complex and emotionally intense issues of symptom management and palliative care in the setting of serious and potentially life-threatening illness.Greater than 50%  of this time was spent counseling and coordinating care related to the above assessment and plan.  Signed by:  Alda Lea, AGPCNP-BC Palliative Medicine Team  Phone: [phone] Fax: [phone] Pager: [phone] Amion: Bjorn Pippin

## 2020-01-01 NOTE — Progress Notes (Signed)
ANTICOAGULATION CONSULT NOTE   Pharmacy Consult for warfarin Indication: atrial fibrillation  No Known Allergies  Patient Measurements: Height: 5' (152.4 cm) Weight: 52.7 kg (116 lb 2.9 oz) IBW/kg (Calculated) : 45.5  Vital Signs: Temp: 97.6 F (36.4 C) (04/19 0721) Temp Source: Oral (04/19 0721) BP: 113/54 (04/19 0400) Pulse Rate: 59 (04/19 0721)  Labs: Recent Labs    12/30/19 0238 12/31/19 0351 01/01/20 0436  LABPROT 31.2* 36.5* 38.2*  INR 3.0* 3.7* 3.9*  CREATININE 1.75* 1.67* 1.59*    Estimated Creatinine Clearance: 15.5 mL/min (A) (by C-G formula based on SCr of 1.59 mg/dL (H)).   Medical History: Past Medical History:  Diagnosis Date  . Acute on chronic diastolic (congestive) heart failure ([place]) 01/22/2019  . Acute on chronic systolic CHF (congestive heart failure) ([place]) 03/04/2018  . Anemia   . Arthritis    "knees especially"  . Bradycardia   . Cardiomyopathy    presumed ischemic in the past with an EF of 35%. The most recent EF in 03-2008, however, was 55 -60%  . CHF 12/28/2007   Qualifier: Diagnosis of  By: Laney Potash, [name]    . Chronic anticoagulation 12/15/2015  . Chronic renal insufficiency   . Dementia ([place])    mild  . Dyslipidemia   . Encounter for therapeutic drug monitoring 12/25/2013  . Glaucoma   . Hypertension   . Malleolar fracture   . Pacemaker infection ([place]) 08/18/2012  . Pacemaker- Medtronic 12/25/2011   DOI 1/13   . PAF (paroxysmal atrial fibrillation) ([place]) 04/19/2017  . Paroxysmal atrial fibrillation (HCC)   . Sinus node dysfunction ([place]) 12/28/2007   Qualifier: Diagnosis of  By: Laney Potash, [name]     . Tachycardia-bradycardia syndrome ([place]) 09/19/2011   Assessment: [name] presenting with SOB x2 weeks despite increase in home lasix dose. Patient is on warfarin PTA for Afib.   PTA warfarin regimen: 2mg  daily, except 1mg  on "Sundays.   INR remains supratherapeutic today at 3.9. Patient is eating >75% of meals which includes bananas and  greens. Patient is also on amiodarone, which could elevate INR. No issues with bleeding reported per RN.   Goal of Therapy:  INR 2-3 Monitor platelets by anticoagulation protocol: Yes   Plan:  Hold warfarin today Daily INR, s/s bleeding Follow-up CBC today Monitor for s/sx's of bleeding      L. , PharmD [facility]AL PGY1 Pharmacy Resident [phone] 01/01/20      10" :47 AM  Please check AMION for all [place] phone numbers After 10:00 PM, call the [place] [phone]

## 2020-01-01 NOTE — Progress Notes (Signed)
PROGRESS NOTE    [name]  QPR:[id number] DOB: [birth year] DOA: 9/93/5701 PCP: Chriss Czar, MD   Brief Narrative:  Patient is a 63 female with history of chronic combined systolic/diastolic CHF, paroxysmal A. fib on Coumadin, tachybradycardia syndrome status post pacemaker placement, hypothyroidism, stage III CKD, hypothyroidism, hyperlipidemia who presented to the emergency department for the evaluation of progressive shortness of breath.  She was admitted for the management of acute on chronic combined CHF exacerbation.  Cardiology following.  Due to her advanced age and end-stage heart failure, cardiology recommending palliative care consultation and family also in agreement.  Waiting for palliative care evaluation, possible transition of her care to comfort/hospice.  Assessment & Plan:   Principal Problem:   Acute on chronic combined systolic and diastolic congestive heart failure (HCC) Active Problems:   Dyslipidemia   Hypertension   PAF (paroxysmal atrial fibrillation) (HCC)   Hypothyroidism   Acute kidney injury superimposed on CKD (HCC)   CHF (congestive heart failure) (HCC)   Acute on chronic combined CHF: Ejection fraction of 20%.  Failed outpatient Lasix therapy.  She was initially started on IV Lasix. Currently on oral Lasix.  During this hospitalization, she had respiratory distress and briefly required BiPAP, currently on 3 L of oxygen at rest.  Cardiology following.  Due to her advanced age and end-stage heart failure, cardiology recommending palliative care consultation and family also in agreement.  Waiting for palliative care evaluation, possible transition of her care to comfort/hospice.  Paroxysmal A. fib/tachybradycardia syndrome: Status post pacemaker.  In paced rhythm.  On amiodarone, Coumadin for anticoagulation.  Beta-blocker on hold due to hypotension  Hypertension: Blood pressure soft.  Antihypertensives on hold.  CKD stage IIIb: Currently kidney  function at baseline.  Monitor renal function.  Avoid nephrotoxins.  Anemia of chronic disease: Associated with CKD.  Vitamin B12 comfort unremarkable.  Hypokalemia: Monitor and supplement .  Hypothyroidism: On Synthyroid  Hyperlipidemia: On rosuvastatin.         DVT prophylaxis:Coumadin Code Status: Full Family Communication: None Status is: Inpatient  Remains inpatient appropriate because:Unsafe d/c plan   Dispo: The patient is from: Home              Anticipated d/c is to: Home              Anticipated d/c date is: 2 days              Patient currently is not medically stable to d/c.  Waiting for palliative  care evaluation.       Consultants: Cardiology  Procedures: None  Antimicrobials:  Anti-infectives (From admission, onward)   None      Subjective: Patient seen and examined at the bedside this morning.  Appeared comfortable.  Alert and awake.  She says she feels better today.  Denies any specific complaints.  Objective: Vitals:   01/01/20 0523 01/01/20 0721 01/01/20 0800 01/01/20 0818  BP:   (!) 95/52 113/88  Pulse:  (!) 59 64 (!) 58  Resp:  18    Temp:  97.6 F (36.4 C)    TempSrc:  Oral    SpO2:  100%    Weight: 52.7 kg     Height:        Intake/Output Summary (Last 24 hours) at 01/01/2020 1319 Last data filed at 01/01/2020 1315 Gross per 24 hour  Intake 1181 ml  Output 1800 ml  Net -619 ml   Filed Weights   12/30/19 0434 12/31/19 0500 01/01/20 7793  Weight: 54.1 kg 52.2 kg 52.7 kg    Examination:  General exam: Pleasant elderly female, comfortable Respiratory system: Bilateral crackles Cardiovascular system: Sinus, no JVD, murmurs, rubs, gallops or clicks. No pedal edema. Gastrointestinal system: Abdomen is nondistended, soft and nontender. No organomegaly or masses felt. Normal bowel sounds heard. Central nervous system: Alert and oriented. No focal neurological deficits. Extremities: No edema, no clubbing ,no cyanosis Skin:  No rashes, lesions or ulcers,no icterus ,no pallor  Data Reviewed: I have personally reviewed following labs and imaging studies  CBC: Recent Labs  Lab 12/28/19 1737 12/29/19 0551 01/01/20 1202  WBC 7.1 6.2 9.1  HGB 11.4* 11.1* 12.2  HCT 38.2 35.5* 38.6  MCV 105.8* 105.0* 103.5*  PLT 150 144* 025   Basic Metabolic Panel: Recent Labs  Lab 12/28/19 1737 12/28/19 2101 12/29/19 0551 12/30/19 0238 12/31/19 0351 01/01/20 0436  NA 142  --  147* 144 142 142  K 3.3*  --  3.4* 4.1 4.7 4.4  CL 101  --  105 106 103 100  CO2 30  --  30 25 30 30   GLUCOSE 156*  --  107* 159* 121* 108*  BUN 29*  --  27* 31* 30* 30*  CREATININE 1.72*  --  1.50* 1.75* 1.67* 1.59*  CALCIUM 9.2  --  8.7* 9.3 9.2 9.7  MG  --  2.0 2.1 2.1  --   --    GFR: Estimated Creatinine Clearance: 15.5 mL/min (A) (by C-G formula based on SCr of 1.59 mg/dL (H)). Liver Function Tests: No results for input(s): AST, ALT, ALKPHOS, BILITOT, PROT, ALBUMIN in the last 168 hours. No results for input(s): LIPASE, AMYLASE in the last 168 hours. No results for input(s): AMMONIA in the last 168 hours. Coagulation Profile: Recent Labs  Lab 12/28/19 1943 12/29/19 0551 12/30/19 0238 12/31/19 0351 01/01/20 0436  INR 2.9* 3.0* 3.0* 3.7* 3.9*   Cardiac Enzymes: No results for input(s): CKTOTAL, CKMB, CKMBINDEX, TROPONINI in the last 168 hours. BNP (last 3 results) No results for input(s): PROBNP in the last 8760 hours. HbA1C: No results for input(s): HGBA1C in the last 72 hours. CBG: No results for input(s): GLUCAP in the last 168 hours. Lipid Profile: No results for input(s): CHOL, HDL, LDLCALC, TRIG, CHOLHDL, LDLDIRECT in the last 72 hours. Thyroid Function Tests: No results for input(s): TSH, T4TOTAL, FREET4, T3FREE, THYROIDAB in the last 72 hours. Anemia Panel: Recent Labs    12/30/19 0238  VITAMINB12 577  FOLATE 39.1   Sepsis Labs: No results for input(s): PROCALCITON, LATICACIDVEN in the last 168  hours.  Recent Results (from the past 240 hour(s))  Respiratory Panel by RT PCR (Flu A&B, Covid) - Nasopharyngeal Swab     Status: None   Collection Time: 12/28/19  7:57 PM   Specimen: Nasopharyngeal Swab  Result Value Ref Range Status   SARS Coronavirus 2 by RT PCR NEGATIVE NEGATIVE Final    Comment: (NOTE) SARS-CoV-2 target nucleic acids are NOT DETECTED. The SARS-CoV-2 RNA is generally detectable in upper respiratoy specimens during the acute phase of infection. The lowest concentration of SARS-CoV-2 viral copies this assay can detect is 131 copies/mL. A negative result does not preclude SARS-Cov-2 infection and should not be used as the sole basis for treatment or other patient management decisions. A negative result may occur with  improper specimen collection/handling, submission of specimen other than nasopharyngeal swab, presence of viral mutation(s) within the areas targeted by this assay, and inadequate number of viral copies (<131 copies/mL).  A negative result must be combined with clinical observations, patient history, and epidemiological information. The expected result is Negative. Fact Sheet for Patients:  PinkCheek.be Fact Sheet for Healthcare Providers:  GravelBags.it This test is not yet ap proved or cleared by the [place] FDA and  has been authorized for detection and/or diagnosis of SARS-CoV-2 by FDA under an Emergency Use Authorization (EUA). This EUA will remain  in effect (meaning this test can be used) for the duration of the COVID-19 declaration under Section 564(b)(1) of the Act, 21 U.S.C. section 360bbb-3(b)(1), unless the authorization is terminated or revoked sooner.    Influenza A by PCR NEGATIVE NEGATIVE Final   Influenza B by PCR NEGATIVE NEGATIVE Final    Comment: (NOTE) The Xpert Xpress SARS-CoV-2/FLU/RSV assay is intended as an aid in  the diagnosis of influenza from Nasopharyngeal  swab specimens and  should not be used as a sole basis for treatment. Nasal washings and  aspirates are unacceptable for Xpert Xpress SARS-CoV-2/FLU/RSV  testing. Fact Sheet for Patients: PinkCheek.be Fact Sheet for Healthcare Providers: GravelBags.it This test is not yet approved or cleared by the [place] FDA and  has been authorized for detection and/or diagnosis of SARS-CoV-2 by  FDA under an Emergency Use Authorization (EUA). This EUA will remain  in effect (meaning this test can be used) for the duration of the  Covid-19 declaration under Section 564(b)(1) of the Act, 21  U.S.C. section 360bbb-3(b)(1), unless the authorization is  terminated or revoked. Performed at [facility], [place] [address], [place],  [address]          Radiology Studies: No results found.      Scheduled Meds: . amiodarone  200 mg Oral Daily  . atorvastatin  5 mg Oral QHS  . brimonidine  1 drop Both Eyes BID  . famotidine  20 mg Oral Daily  . furosemide  40 mg Oral QPM  . [START ON 01/02/2020] furosemide  80 mg Oral Daily  . latanoprost  1 drop Both Eyes QHS  . levothyroxine  25 mcg Oral QAC breakfast  . mirtazapine  7.5 mg Oral QHS  . olopatadine  1 drop Both Eyes q morning - 10a  . potassium chloride  40 mEq Oral BID  . sodium chloride flush  3 mL Intravenous Q12H  . Warfarin - Pharmacist Dosing Inpatient   Does not apply q1600   Continuous Infusions: . sodium chloride       LOS: 3 days    Time spent: 25 mins.More than 50% of that time was spent in counseling and/or coordination of care.      Shelly Coss, MD [facility] P4/19/2021, 1:19 PM

## 2020-01-02 LAB — PROTIME-INR
INR: 2.5 — ABNORMAL HIGH (ref 0.8–1.2)
Prothrombin Time: 27.2 seconds — ABNORMAL HIGH (ref 11.4–15.2)

## 2020-01-02 LAB — BASIC METABOLIC PANEL
Anion gap: 10 (ref 5–15)
BUN: 32 mg/dL — ABNORMAL HIGH (ref 8–23)
CO2: 33 mmol/L — ABNORMAL HIGH (ref 22–32)
Calcium: 9.7 mg/dL (ref 8.9–10.3)
Chloride: 99 mmol/L (ref 98–111)
Creatinine, Ser: 1.59 mg/dL — ABNORMAL HIGH (ref 0.44–1.00)
GFR calc Af Amer: 32 mL/min — ABNORMAL LOW (ref >60)
GFR calc non Af Amer: 28 mL/min — ABNORMAL LOW (ref >60)
Glucose, Bld: 123 mg/dL — ABNORMAL HIGH (ref 70–99)
Potassium: 4.6 mmol/L (ref 3.5–5.1)
Sodium: 142 mmol/L (ref 135–145)

## 2020-01-02 MED ORDER — POTASSIUM CHLORIDE 20 MEQ PO PACK: 40.0000 meq | PACK | Freq: Two times a day (BID) | ORAL | 2 refills | Status: DC

## 2020-01-02 MED ORDER — FUROSEMIDE 40 MG PO TABS: 40.0000 mg | ORAL_TABLET | Freq: Every evening | ORAL | 1 refills | Status: DC

## 2020-01-02 MED ORDER — AMIODARONE HCL 200 MG PO TABS: 200.0000 mg | ORAL_TABLET | Freq: Every day | ORAL | 1 refills | Status: DC

## 2020-01-02 MED ORDER — FUROSEMIDE 80 MG PO TABS: 80.0000 mg | ORAL_TABLET | Freq: Every day | ORAL | 1 refills | Status: DC

## 2020-01-02 MED ORDER — WARFARIN SODIUM 2 MG PO TABS: 2.0000 mg | ORAL_TABLET | Freq: Once | ORAL | Status: DC

## 2020-01-02 NOTE — Progress Notes (Signed)
Discharge and medication education given to pt and daughter with teach back.  Peripheral iv removed and dressing applied.  Pt is currently dressed and oxygen is in room. Pt's daughter is currently waiting for oxygen tank education .  Pt's belongings with pt and daughter.

## 2020-01-02 NOTE — Progress Notes (Signed)
Daily Progress Note   Patient Name: [name]       Date: 01/02/2020 DOB: 1923-02-10  Age: 86 y.o. MRN#: [id number] Attending Physician: Shelly Coss, MD Primary Care Physician: Chriss Czar, MD Admit Date: 12/28/2019  Reason for Consultation/Follow-up: Establishing goals of care  Subjective: [name] is sitting up in the recliner watching TV. She denies pain or shortness of breath. States she feels much better today and had a good night's sleep. Alert and oriented x3. States she is hopeful she will get to go home soon.   Education provided on home support via outpatient palliative for continued guidance and goals of care discussions. Education also provided on the need for home oxygen support and use. [name] verbalized understanding expressing her appreciation of care and support. She states she is hopeful she will continue to do well at home knowing she will have oxygen available for use. Support given.   Encouraged patient and daughter to continue important goals of care discussion in regards to transitioning to a more comfort and hospice approach and code status.   Length of Stay: 4  Current Medications: Scheduled Meds:  . [START ON 01/04/2020] amiodarone  200 mg Oral Once per day on Sun Mon Thu Fri Sat  . atorvastatin  5 mg Oral QHS  . brimonidine  1 drop Both Eyes BID  . famotidine  20 mg Oral Daily  . furosemide  40 mg Oral QPM  . furosemide  80 mg Oral Daily  . latanoprost  1 drop Both Eyes QHS  . levothyroxine  25 mcg Oral QAC breakfast  . mirtazapine  7.5 mg Oral QHS  . olopatadine  1 drop Both Eyes q morning - 10a  . potassium chloride  40 mEq Oral BID  . sodium chloride flush  3 mL Intravenous Q12H  . warfarin  2 mg Oral ONCE-1600  . Warfarin -  Pharmacist Dosing Inpatient   Does not apply q1600    Continuous Infusions: . sodium chloride      PRN Meds: sodium chloride, acetaminophen, ipratropium-albuterol, ondansetron (ZOFRAN) IV, sodium chloride flush  Physical Exam  -awake, alert, chronically-ill appearing -RRR -diminished bilaterally -A&O x3, mood appropriate, follows commands.          Vital Signs: BP (!) 121/59 (BP Location: Right Arm)   Pulse 62  Temp 97.8 F (36.6 C) (Oral)   Resp 18   Ht 5' (1.524 m)   Wt 51.8 kg Comment: scale b  SpO2 100%   BMI 22.30 kg/m  SpO2: SpO2: 100 % O2 Device: O2 Device: Nasal Cannula O2 Flow Rate: O2 Flow Rate (L/min): 2 L/min  Intake/output summary:   Intake/Output Summary (Last 24 hours) at 01/02/2020 1313 Last data filed at 01/02/2020 1106 Gross per 24 hour  Intake 270 ml  Output 800 ml  Net -530 ml   LBM: Last BM Date: 01/01/20 Baseline Weight: Weight: 54 kg Most recent weight: Weight: 51.8 kg(scale b)       Palliative Assessment/Data: PPS 30%      Patient Active Problem List   Diagnosis Date Noted  . CHF (congestive heart failure) ([place]) 12/29/2019  . Acute on chronic combined systolic and diastolic congestive heart failure ([place]) 12/28/2019  . Hypothyroidism   . Acute kidney injury superimposed on CKD ([place])   . LBBB (left bundle branch block) 12/06/2019  . Educated about COVID-19 virus infection 09/28/2019  . Acute on chronic diastolic (congestive) heart failure ([place]) 01/22/2019  . Acute on chronic systolic CHF (congestive heart failure) ([place]) 03/04/2018  . PAF (paroxysmal atrial fibrillation) ([place]) 04/19/2017  . Chronic anticoagulation 12/15/2015  . Encounter for therapeutic drug monitoring 12/25/2013  . Pacemaker infection ([place]) 08/18/2012  . Pacemaker- Medtronic 12/25/2011  . Tachycardia-bradycardia syndrome ([place]) 09/19/2011  . Chronic renal insufficiency, stage III (moderate) ([place]) 03/12/2010  . Dyslipidemia 12/28/2007  . ANEMIA 12/28/2007  .  Dementia without behavioral disturbance ([place]) 12/28/2007  . Glaucoma 12/28/2007  . Hypertension 12/28/2007  . Cardiomyopathy EF 20% by echo [facility]) 12/28/2007  . Atrial fibrillation with RVR ([place]) 12/28/2007  . Sinus node dysfunction ([place]) 12/28/2007  . CHF 12/28/2007    Palliative Care Assessment & Plan   Recommendations/Plan: Encouraged patient and daughter to continue with important goals of care discussions and addressing code status in the setting of advanced age and end-stage heart failure.  Outpatient palliative support for continued support and guidance.   Goals of Care and Additional Recommendations: Limitations on Scope of Treatment: Full Scope Treatment and No Artificial Feeding  Code Status:    Code Status Orders  (From admission, onward)         Start     Ordered   12/28/19 2100  Full code  Continuous     12/28/19 2101        Code Status History    Date Active Date Inactive Code Status Order ID Comments User Context   01/20/2019 1943 01/23/2019 1519 Full Code [id number]  Lavina Hamman, MD ED   12/15/2015 0953 12/18/2015 1621 Full Code [id number]  Erlene Quan, PA-C ED   09/18/2011 1641 09/19/2011 1432 Full Code [id number]  Margarette Canada, RN Inpatient   Advance Care Planning Activity      Prognosis:  Guarded to Poor  Discharge Planning: Home with Home Health and outpatient Palliative.   Thank you for allowing the Palliative Medicine Team to assist in the care of this patient.  Time Total: 25 min.   Visit consisted of counseling and education dealing with the complex and emotionally intense issues of symptom management and palliative care in the setting of serious and potentially life-threatening illness.Greater than 50%  of this time was spent counseling and coordinating care related to the above assessment and plan.  Alda Lea, AGPCNP-BC  Palliative Medicine Team [phone]   Please contact Palliative Medicine Team phone at  [phone]  for questions and concerns.

## 2020-01-02 NOTE — TOC Progression Note (Addendum)
Transition of Care [facility]) - Progression Note    Patient Details  Name: [name] MRN: [id number] Date of Birth: 05-Dec-1922  Transition [facility]) CM/SW Contact  Zenon Mayo, RN Phone Number: 01/02/2020, 12:22 PM  Clinical Narrative:    NCM spoke with daughter sybil on the phone informed her that patient will need home oxygen, she states to go with [employer], referral made to [facility] with [employer], she will bring oxygen up to room prior to dc. Also  NCM asked [name] if she wanted outpatient palliative services, she said yes and would like that with Janeece Riggers also since they have [facility] services with [facility] , which they also do palliative.  NCM contacted [facility] and spoke with Diane , informed her that patient is for dc home today and that she will also need to be set up with palliative services, Levander Campion states they will get her set up with palliative services.       Barriers to Discharge: No Barriers Identified  Expected Discharge Plan and Services           Expected Discharge Date: 01/02/20                 DME Agency: NA       HH Arranged: RN, Disease Management, PT [place] Agency: [place] Date [facility] Agency Contacted: 01/01/20 Time [place]: [phone] Representative spoke with at [place]: [place] Determinants of Health ([place]) Interventions    Readmission Risk Interventions No flowsheet data found.

## 2020-01-02 NOTE — Progress Notes (Signed)
ANTICOAGULATION CONSULT NOTE   Pharmacy Consult for warfarin Indication: atrial fibrillation  No Known Allergies  Patient Measurements: Height: 5' (152.4 cm) Weight: 51.8 kg (114 lb 3.2 oz)(scale b) IBW/kg (Calculated) : 45.5  Vital Signs: Temp: 97.5 F (36.4 C) (04/20 0759) Temp Source: Oral (04/20 0759) BP: 133/66 (04/20 0759) Pulse Rate: 62 (04/20 0759)  Labs: Recent Labs    12/31/19 0351 01/01/20 0436 01/01/20 1202 01/02/20 0508  HGB  --   --  12.2  --   HCT  --   --  38.6  --   PLT  --   --  155  --   LABPROT 36.5* 38.2*  --  27.2*  INR 3.7* 3.9*  --  2.5*  CREATININE 1.67* 1.59*  --  1.59*    Estimated Creatinine Clearance: 15.5 mL/min (A) (by C-G formula based on SCr of 1.59 mg/dL (H)).   Medical History: Past Medical History:  Diagnosis Date  . Acute on chronic diastolic (congestive) heart failure ([place]) 01/22/2019  . Acute on chronic systolic CHF (congestive heart failure) ([place]) 03/04/2018  . Anemia   . Arthritis    "knees especially"  . Bradycardia   . Cardiomyopathy    presumed ischemic in the past with an EF of 35%. The most recent EF in 03-2008, however, was 55 -60%  . CHF 12/28/2007   Qualifier: Diagnosis of  By: Laney Potash, [name]    . Chronic anticoagulation 12/15/2015  . Chronic renal insufficiency   . Dementia ([place])    mild  . Dyslipidemia   . Encounter for therapeutic drug monitoring 12/25/2013  . Glaucoma   . Hypertension   . Malleolar fracture   . Pacemaker infection ([place]) 08/18/2012  . Pacemaker- Medtronic 12/25/2011   DOI 1/13   . PAF (paroxysmal atrial fibrillation) ([place]) 04/19/2017  . Paroxysmal atrial fibrillation (HCC)   . Sinus node dysfunction ([place]) 12/28/2007   Qualifier: Diagnosis of  By: Laney Potash, [name]     . Tachycardia-bradycardia syndrome ([place]) 09/19/2011   Assessment: 89 YOF presenting with SOB x2 weeks despite increase in home lasix dose. Patient is on warfarin PTA for Afib.   PTA warfarin regimen: 2mg  daily, except 1mg   on "Sundays.   Warfarin held 4/18 and 4/19 due to supratherapeutic INRs. INR today down to 2.5. H&H improved from admission, PLTs WNL. Patient is eating >75% of meals which includes bananas and greens. Patient is also on amiodarone, which could elevate INR. No issues with bleeding reported per RN.   Goal of Therapy:  INR 2-3 Monitor platelets by anticoagulation protocol: Yes   Plan:  Give warfarin 2 mg x1 Daily INR, s/s bleeding CBC every 3 days Monitor for s/sx's of bleeding      L. , PharmD HSPAL PGY1 Pharmacy Resident [phone] 01/02/20      10" :05 AM  Please check AMION for all [place] phone numbers After 10:00 PM, call the [place] [phone]

## 2020-01-02 NOTE — Progress Notes (Signed)
Physical Therapy Treatment Patient Details Name: [name] MRN: [id number] DOB: [birth year] Today's Date: 01/02/2020    History of Present Illness Pt is a 86 y/o female c/o progressive SOB x2 weeks, ultimately referred to ED by [facility] and OP Cardiology. Covid negative. Admitted due to acute on chronic CHF exacerbation. PMH CHF, cardiomyopathy, chronic anticoagulation, renal insufficiency, dementia, HTN, dyslipidemia, hx ankle fracture, tachy-brady syndrome s/p PPM and pace maker revision    PT Comments    Pt up in chair finishing lunch on entry. Agreeable to ambulate after she finished her milk. Pt is limited in safe mobility by increased oxygen demand, decreased hearing and cognition. Pt is currently min guard for transfers and ambulation of 220 feet with RW. Pt daughter in room at end of session for discharge of patient. D/c plans remain appropriate.    Follow Up Recommendations  Home health PT;Supervision/Assistance - 24 hour     Equipment Recommendations  None recommended by PT       Precautions / Restrictions Precautions Precautions: Fall Precaution Comments: very HOH Restrictions Weight Bearing Restrictions: No    Mobility  Bed Mobility               General bed mobility comments: OOB in recliner finishing lunch on entry   Transfers Overall transfer level: Needs assistance Equipment used: Rolling walker (2 wheeled) Transfers: Sit to/from Stand Sit to Stand: Min guard         General transfer comment: min guard for safety with power up   Ambulation/Gait Ambulation/Gait assistance: Min guard Gait Distance (Feet): 220 Feet Assistive device: Rolling walker (2 wheeled) Gait Pattern/deviations: Step-through pattern;Trunk flexed Gait velocity: decreased Gait velocity interpretation: <1.8 ft/sec, indicate of risk for recurrent falls General Gait Details: min guard for slow, steady gait, vc for proximity to RW, however is hindered by increased kyphosis          Balance Overall balance assessment: Needs assistance Sitting-balance support: Feet supported Sitting balance-Leahy Scale: Good     Standing balance support: During functional activity;Bilateral upper extremity supported;Single extremity supported Standing balance-Leahy Scale: Fair                              Cognition Arousal/Alertness: Awake/alert Behavior During Therapy: WFL for tasks assessed/performed Overall Cognitive Status: Difficult to assess Area of Impairment: Following commands;Problem solving                       Following Commands: Follows one step commands inconsistently;Follows one step commands with increased time     Problem Solving: Slow processing;Decreased initiation;Difficulty sequencing;Requires verbal cues           General Comments General comments (skin integrity, edema, etc.): Pt on 2L O2 via [place] with SaO2 >91%O2 throughout session. Daughter in room on entry.       Pertinent Vitals/Pain Pain Assessment: No/denies pain                                      PT Goals (current goals can now be found in the care plan section) Acute Rehab PT Goals PT Goal Formulation: With patient Time For Goal Achievement: 01/12/20 Potential to Achieve Goals: Good    Frequency    Min 3X/week      PT Plan Current plan remains appropriate       AM-PAC PT "  6 Clicks" Mobility   Outcome Measure  Help needed turning from your back to your side while in a flat bed without using bedrails?: None Help needed moving from lying on your back to sitting on the side of a flat bed without using bedrails?: None Help needed moving to and from a bed to a chair (including a wheelchair)?: A Little Help needed standing up from a chair using your arms (e.g., wheelchair or bedside chair)?: A Little Help needed to walk in hospital room?: A Little Help needed climbing 3-5 steps with a railing? : A Lot 6 Click Score: 19    End of  Session Equipment Utilized During Treatment: Gait belt Activity Tolerance: Patient tolerated treatment well Patient left: with call bell/phone within reach;in chair;with family/visitor present Nurse Communication: Mobility status PT Visit Diagnosis: Muscle weakness (generalized) (M62.81)     Time: 8871-9597 PT Time Calculation (min) (ACUTE ONLY): 21 min  Charges:  $Gait Training: 8-22 mins                     Breydan [place] B. Migdalia Dk PT, DPT Acute Rehabilitation Services Pager [phone] Office [phone]    [place] 01/02/2020, 2:26 PM

## 2020-01-02 NOTE — Care Management Important Message (Signed)
Important Message  Patient Details  Name: [name] MRN: [id number] Date of Birth: Nov 29, 1922   Medicare Important Message Given:  Yes     Shelda Altes 01/02/2020, 10:35 AM

## 2020-01-02 NOTE — Progress Notes (Signed)
  SATURATION QUALIFICATIONS: (This note is used to comply with regulatory documentation for home oxygen)  Patient Saturations on Room Air at Rest = 94%  Patient Saturations on Room Air while Ambulating = 81%  Patient Saturations on 3 Liters of oxygen while Ambulating = 96%  Please briefly explain why patient needs home oxygen:patient's oxygen saturation drops rapidly upon removing O2 [place] and ambulating.  Patient recovers fast with O2 [place] 3L.

## 2020-01-02 NOTE — Discharge Summary (Signed)
Physician Discharge Summary  [name] FVC:[id number] DOB: [birth year] DOA: 6/38/4665  PCP: Chriss Czar, MD  Admit date: 12/28/2019 Discharge date: 01/02/2020  Admitted From: Home Disposition:  Home  Discharge Condition:Stable CODE STATUS:FULL Diet recommendation: Heart Healthy   Brief/Interim Summary:  Patient is a 89 female with history of chronic combined systolic/diastolic CHF, paroxysmal A. fib on Coumadin, tachybradycardia syndrome status post pacemaker placement, hypothyroidism, stage III CKD, hypothyroidism, hyperlipidemia who presented to the emergency department for the evaluation of progressive shortness of breath.  She was admitted for the management of acute on chronic combined CHF exacerbation.  Cardiology following.  Due to her advanced age and end-stage heart failure, cardiology recommending palliative care consultation .  After discussion with family, she remains full code and the family wants to follow-up with palliative care as an outpatient.  Cardiology has cleared her for discharge.  She is hemodynamically stable for discharge home today.  She qualified for home oxygen.  Following problems were addressed during her  Hospitalization:   Acute on chronic combined CHF: Ejection fraction of 20%.  Failed outpatient Lasix therapy.  She was initially started on IV Lasix. Currently on oral Lasix.  During this hospitalization, she had respiratory distress and briefly required BiPAP, currently on 2-3 L of oxygen at rest.  Cardiology following.  Due to her advanced age and end-stage heart failure, we consulted palliative care.  Plan for outpatient follow-up.  Paroxysmal A. fib/tachybradycardia syndrome: Status post pacemaker.  In paced rhythm.  On amiodarone, Coumadin for anticoagulation.  Beta-blocker on hold due to hypotension  Hypertension: Blood pressure soft.  Antihypertensives on hold.  CKD stage IIIb: Currently kidney function at baseline.  Monitor renal  function.  Avoid nephrotoxins.Check bmp in a week.  Anemia of chronic disease: Associated with CKD.  Vitamin B12 comfort unremarkable.  Hypokalemia: Continue supplementation .  Hypothyroidism: On Synthyroid  Hyperlipidemia: On rosuvastatin.   Discharge Diagnoses:  Principal Problem:   Acute on chronic combined systolic and diastolic congestive heart failure (HCC) Active Problems:   Dyslipidemia   Hypertension   PAF (paroxysmal atrial fibrillation) (HCC)   Hypothyroidism   Acute kidney injury superimposed on CKD (HCC)   CHF (congestive heart failure) ([place])    Discharge Instructions  Discharge Instructions    Diet - low sodium heart healthy   Complete by: As directed    Discharge instructions   Complete by: As directed    1)Please follow up with your PCP in a week.  Do a BMP test during the follow-up. 2)Follow up with your cardiologist in 2 weeks. 3)Follow up with palliative care as an outpatient. 4)Take prescribed medications as instructed.   Increase activity slowly   Complete by: As directed      Allergies as of 01/02/2020   No Known Allergies     Medication List    STOP taking these medications   lisinopril 5 MG tablet Commonly known as: ZESTRIL   metoprolol tartrate 50 MG tablet Commonly known as: LOPRESSOR   potassium chloride SA 20 MEQ tablet Commonly known as: KLOR-CON     TAKE these medications   acetaminophen 500 MG tablet Commonly known as: TYLENOL Take 500 mg by mouth every 6 (six) hours as needed for headache (pain).   amiodarone 200 MG tablet Commonly known as: PACERONE Take 1 tablet (200 mg total) by mouth daily. What changed:   how much to take  how to take this  when to take this  additional instructions   atorvastatin 10 MG tablet  Commonly known as: LIPITOR Take 0.5 tablets (5 mg total) by mouth at bedtime.   brimonidine 0.2 % ophthalmic solution Commonly known as: ALPHAGAN Place 1 drop into both eyes daily.    furosemide 40 MG tablet Commonly known as: LASIX Take 1 tablet (40 mg total) by mouth every evening. What changed: You were already taking a medication with the same name, and this prescription was added. Make sure you understand how and when to take each.   furosemide 80 MG tablet Commonly known as: LASIX Take 1 tablet (80 mg total) by mouth daily. Start taking on: January 03, 2020 What changed:   how much to take  how to take this  when to take this  additional instructions   levothyroxine 25 MCG tablet Commonly known as: SYNTHROID Take 1 tablet (25 mcg total) by mouth daily before breakfast.   Melatonin 3 MG Caps Take 3 mg by mouth at bedtime.   mirtazapine 7.5 MG tablet Commonly known as: REMERON Take 7.5 mg by mouth at bedtime.   multivitamin with minerals Tabs tablet Take 1 tablet by mouth daily.   Pataday 0.2 % Soln Generic drug: Olopatadine HCl Place 1 drop into both eyes daily.   Pepcid AC Maximum Strength 20 MG Chew Generic drug: Famotidine Chew 1 tablet by mouth at bedtime.   potassium chloride 20 MEQ packet Commonly known as: KLOR-CON Take 40 mEq by mouth 2 (two) times daily.   senna 8.6 MG Tabs tablet Commonly known as: SENOKOT Take 1 tablet by mouth at bedtime as needed for mild constipation.   Travoprost (BAK Free) 0.004 % Soln ophthalmic solution Commonly known as: TRAVATAN Place 1 drop into both eyes at bedtime.   warfarin 2 MG tablet Commonly known as: COUMADIN Take as directed. If you are unsure how to take this medication, talk to your nurse or doctor. Original instructions: TAKE 1 TABLET BY MOUTH DAILY OR AS DIRECTED BY COUMADIN CLINIC What changed:   how much to take  how to take this  when to take this  additional instructions            Durable Medical Equipment  (From admission, onward)         Start     Ordered   01/02/20 1111  For home use only DME oxygen  Once    Question Answer Comment  Length of Need  Lifetime   Mode or (Route) Nasal cannula   Liters per Minute 2   Frequency Continuous (stationary and portable oxygen unit needed)   Oxygen delivery system Gas      01/02/20 1110         Follow-up [facility] Follow up.   Why: HHRN,HHPT Contact information: [place] [address] [phone]       Chriss Czar, MD. Schedule an appointment as soon as possible for a visit in 1 week(s).   Specialty: Family Medicine Contact information: [place] [address] [phone]        Minus Breeding, MD. Schedule an appointment as soon as possible for a visit in 2 week(s).   Specialty: Cardiology Contact information: [address] [place] [address] [phone]          No Known Allergies  Consultations:  Cardiology, palliative care   Procedures/Studies: DG Chest 2 View  Result Date: 12/28/2019 CLINICAL DATA:  Chest pain, history of CHF, PAF EXAM: CHEST - 2  VIEW COMPARISON:  Radiograph 11/26/2019 CT 05/22/2006 FINDINGS: Lung volumes are low with central vascular crowding suggests some atelectatic changes. Some hazy interstitial features are present as well with indistinct pulmonary vascularity and septal thickening. Obscuration of the right hemidiaphragm may reflect trace right pleural fluid. No left effusion or pneumothorax. Tortuosity of the tracheal air column is similar to priors. Cardiomegaly is similar to comparison exams cardiac leads at the right atrium and cardiac apex. The aorta is calcified. The remaining cardiomediastinal contours are unremarkable. The osseous structures appear diffusely demineralized which may limit detection of small or nondisplaced fractures. No acute osseous or soft tissue abnormality. Degenerative changes are present in the imaged spine and shoulders. Exaggerated thoracic kyphosis is noted. Finding similar to comparison. IMPRESSION: Features suggest CHF with  cardiomegaly and pulmonary edema with possible trace right effusion. Additional atelectatic changes with low inspiration. Electronically Signed   By: Lovena Le M.D.   On: 12/28/2019 17:50   DG CHEST PORT 1 VIEW  Result Date: 12/29/2019 CLINICAL DATA:  Shortness of breath EXAM: PORTABLE CHEST 1 VIEW COMPARISON:  12/28/2019 FINDINGS: Cardiomegaly with left chest multi lead pacer. Small layering right pleural effusion, similar to prior examination. The visualized skeletal structures are unremarkable. IMPRESSION: Cardiomegaly with a small, layering right pleural effusion, similar to prior examination. There is no new or focal airspace opacity. Electronically Signed   By: Eddie Candle M.D.   On: 12/29/2019 23:31       Subjective: Patient seen and examined at the bedside this morning.  Sitting in the chair.  Feels comfortable.  Stable for discharge today.  Discharge Exam: Vitals:   01/02/20 0759 01/02/20 1102  BP: 133/66 (!) 121/59  Pulse: 62 62  Resp: 18 18  Temp: (!) 97.5 F (36.4 C) 97.8 F (36.6 C)  SpO2: 100%    Vitals:   01/02/20 0500 01/02/20 0723 01/02/20 0759 01/02/20 1102  BP: (!) 129/57 133/64 133/66 (!) 121/59  Pulse: 63 62 62 62  Resp: 18 16 18 18   Temp: 97.6 F (36.4 C) (!) 97.5 F (36.4 C) (!) 97.5 F (36.4 C) 97.8 F (36.6 C)  TempSrc: Oral Oral Oral Oral  SpO2: 100% 100% 100%   Weight: 51.8 kg     Height:        General: Pt is alert, awake, not in acute distress Cardiovascular: RRR, S1/S2 +, no rubs, no gallops Respiratory: Bilateral basal crackles Abdominal: Soft, NT, ND, bowel sounds + Extremities: no edema, no cyanosis    The results of significant diagnostics from this hospitalization (including imaging, microbiology, ancillary and laboratory) are listed below for reference.     Microbiology: Recent Results (from the past 240 hour(s))  Respiratory Panel by RT PCR (Flu A&B, Covid) - Nasopharyngeal Swab     Status: None   Collection Time: 12/28/19   7:57 PM   Specimen: Nasopharyngeal Swab  Result Value Ref Range Status   SARS Coronavirus 2 by RT PCR NEGATIVE NEGATIVE Final    Comment: (NOTE) SARS-CoV-2 target nucleic acids are NOT DETECTED. The SARS-CoV-2 RNA is generally detectable in upper respiratoy specimens during the acute phase of infection. The lowest concentration of SARS-CoV-2 viral copies this assay can detect is 131 copies/mL. A negative result does not preclude SARS-Cov-2 infection and should not be used as the sole basis for treatment or other patient management decisions. A negative result may occur with  improper specimen collection/handling, submission of specimen other than nasopharyngeal swab, presence of viral mutation(s) within the areas targeted  by this assay, and inadequate number of viral copies (<131 copies/mL). A negative result must be combined with clinical observations, patient history, and epidemiological information. The expected result is Negative. Fact Sheet for Patients:  PinkCheek.be Fact Sheet for Healthcare Providers:  GravelBags.it This test is not yet ap proved or cleared by the [place] FDA and  has been authorized for detection and/or diagnosis of SARS-CoV-2 by FDA under an Emergency Use Authorization (EUA). This EUA will remain  in effect (meaning this test can be used) for the duration of the COVID-19 declaration under Section 564(b)(1) of the Act, 21 U.S.C. section 360bbb-3(b)(1), unless the authorization is terminated or revoked sooner.    Influenza A by PCR NEGATIVE NEGATIVE Final   Influenza B by PCR NEGATIVE NEGATIVE Final    Comment: (NOTE) The Xpert Xpress SARS-CoV-2/FLU/RSV assay is intended as an aid in  the diagnosis of influenza from Nasopharyngeal swab specimens and  should not be used as a sole basis for treatment. Nasal washings and  aspirates are unacceptable for Xpert Xpress SARS-CoV-2/FLU/RSV   testing. Fact Sheet for Patients: PinkCheek.be Fact Sheet for Healthcare Providers: GravelBags.it This test is not yet approved or cleared by the [place] FDA and  has been authorized for detection and/or diagnosis of SARS-CoV-2 by  FDA under an Emergency Use Authorization (EUA). This EUA will remain  in effect (meaning this test can be used) for the duration of the  Covid-19 declaration under Section 564(b)(1) of the Act, 21  U.S.C. section 360bbb-3(b)(1), unless the authorization is  terminated or revoked. Performed at [facility], [place] [address] [place], [place] [address]      Labs: BNP (last 3 results) Recent Labs    01/20/19 1407 12/28/19 1938  BNP 2,283.5* 5,638.7*   Basic Metabolic Panel: Recent Labs  Lab 12/28/19 1737 12/28/19 2101 12/29/19 0551 12/30/19 0238 12/31/19 0351 01/01/20 0436 01/02/20 0508  NA   < >  --  147* 144 142 142 142  K   < >  --  3.4* 4.1 4.7 4.4 4.6  CL   < >  --  105 106 103 100 99  CO2   < >  --  30 25 30 30  33*  GLUCOSE   < >  --  107* 159* 121* 108* 123*  BUN   < >  --  27* 31* 30* 30* 32*  CREATININE   < >  --  1.50* 1.75* 1.67* 1.59* 1.59*  CALCIUM   < >  --  8.7* 9.3 9.2 9.7 9.7  MG  --  2.0 2.1 2.1  --   --   --    < > = values in this interval not displayed.   Liver Function Tests: No results for input(s): AST, ALT, ALKPHOS, BILITOT, PROT, ALBUMIN in the last 168 hours. No results for input(s): LIPASE, AMYLASE in the last 168 hours. No results for input(s): AMMONIA in the last 168 hours. CBC: Recent Labs  Lab 12/28/19 1737 12/29/19 0551 01/01/20 1202  WBC 7.1 6.2 9.1  HGB 11.4* 11.1* 12.2  HCT 38.2 35.5* 38.6  MCV 105.8* 105.0* 103.5*  PLT 150 144* 155   Cardiac Enzymes: No results for input(s): CKTOTAL, CKMB, CKMBINDEX, TROPONINI in the last 168 hours. BNP: Invalid input(s): POCBNP CBG: No results for input(s): GLUCAP in the last 168  hours. D-Dimer No results for input(s): DDIMER in the last 72 hours. Hgb A1c No results for input(s): HGBA1C in the last 72 hours.  Lipid Profile No results for input(s): CHOL, HDL, LDLCALC, TRIG, CHOLHDL, LDLDIRECT in the last 72 hours. Thyroid function studies No results for input(s): TSH, T4TOTAL, T3FREE, THYROIDAB in the last 72 hours.  Invalid input(s): FREET3 Anemia work up No results for input(s): VITAMINB12, FOLATE, FERRITIN, TIBC, IRON, RETICCTPCT in the last 72 hours. Urinalysis    Component Value Date/Time   COLORURINE YELLOW 01/21/2010 1424   APPEARANCEUR CLEAR 01/21/2010 1424   LABSPEC 1.013 01/21/2010 1424   PHURINE 5.5 01/21/2010 1424   GLUCOSEU NEGATIVE 01/21/2010 1424   HGBUR NEGATIVE 01/21/2010 [place] 01/21/2010 1424   [place] 01/21/2010 1424   PROTEINUR NEGATIVE 01/21/2010 1424   UROBILINOGEN 0.2 01/21/2010 1424   NITRITE NEGATIVE 01/21/2010 1424   LEUKOCYTESUR NEGATIVE 01/21/2010 1424   Sepsis Labs Invalid input(s): PROCALCITONIN,  WBC,  LACTICIDVEN Microbiology Recent Results (from the past 240 hour(s))  Respiratory Panel by RT PCR (Flu A&B, Covid) - Nasopharyngeal Swab     Status: None   Collection Time: 12/28/19  7:57 PM   Specimen: Nasopharyngeal Swab  Result Value Ref Range Status   SARS Coronavirus 2 by RT PCR NEGATIVE NEGATIVE Final    Comment: (NOTE) SARS-CoV-2 target nucleic acids are NOT DETECTED. The SARS-CoV-2 RNA is generally detectable in upper respiratoy specimens during the acute phase of infection. The lowest concentration of SARS-CoV-2 viral copies this assay can detect is 131 copies/mL. A negative result does not preclude SARS-Cov-2 infection and should not be used as the sole basis for treatment or other patient management decisions. A negative result may occur with  improper specimen collection/handling, submission of specimen other than nasopharyngeal swab, presence of viral mutation(s) within  the areas targeted by this assay, and inadequate number of viral copies (<131 copies/mL). A negative result must be combined with clinical observations, patient history, and epidemiological information. The expected result is Negative. Fact Sheet for Patients:  PinkCheek.be Fact Sheet for Healthcare Providers:  GravelBags.it This test is not yet ap proved or cleared by the [place] FDA and  has been authorized for detection and/or diagnosis of SARS-CoV-2 by FDA under an Emergency Use Authorization (EUA). This EUA will remain  in effect (meaning this test can be used) for the duration of the COVID-19 declaration under Section 564(b)(1) of the Act, 21 U.S.C. section 360bbb-3(b)(1), unless the authorization is terminated or revoked sooner.    Influenza A by PCR NEGATIVE NEGATIVE Final   Influenza B by PCR NEGATIVE NEGATIVE Final    Comment: (NOTE) The Xpert Xpress SARS-CoV-2/FLU/RSV assay is intended as an aid in  the diagnosis of influenza from Nasopharyngeal swab specimens and  should not be used as a sole basis for treatment. Nasal washings and  aspirates are unacceptable for Xpert Xpress SARS-CoV-2/FLU/RSV  testing. Fact Sheet for Patients: PinkCheek.be Fact Sheet for Healthcare Providers: GravelBags.it This test is not yet approved or cleared by the [place] FDA and  has been authorized for detection and/or diagnosis of SARS-CoV-2 by  FDA under an Emergency Use Authorization (EUA). This EUA will remain  in effect (meaning this test can be used) for the duration of the  Covid-19 declaration under Section 564(b)(1) of the Act, 21  U.S.C. section 360bbb-3(b)(1), unless the authorization is  terminated or revoked. Performed at [facility], [place] [address], [place], [place] [address]     Please note: You were cared for by a hospitalist during your  hospital stay. Once you are discharged, your primary care physician will  handle any further medical issues. Please note that NO REFILLS for any discharge medications will be authorized once you are discharged, as it is imperative that you return to your primary care physician (or establish a relationship with a primary care physician if you do not have one) for your post hospital discharge needs so that they can reassess your need for medications and monitor your lab values.    Time coordinating discharge: 40 minutes  SIGNED:   Shelly Coss, MD  [facility] 01/02/2020, 11:44 AM Pager [phone]  If 7PM-7AM, please contact night-coverage [email or web address] Password [email or web address]

## 2020-01-04 ENCOUNTER — Telehealth: Payer: Self-pay | Admitting: Cardiology

## 2020-01-04 NOTE — Telephone Encounter (Signed)
Yes please

## 2020-01-04 NOTE — Telephone Encounter (Signed)
[name] from [facility] is calling to get verbal orders that Dr. Percival Spanish would like this patient to get her INR checked.

## 2020-01-05 NOTE — Telephone Encounter (Signed)
Spoke with Aaron Edelman at [facility]. Home Health with take over INRs for the next 1-2 months as long as patient is with the company. Aaron Edelman is hoping that with some physical therapy patient will be able to start coming back to the coumadin clinic for checks.

## 2020-01-17 ENCOUNTER — Other Ambulatory Visit: Payer: Self-pay | Admitting: Cardiology

## 2020-01-21 NOTE — Progress Notes (Signed)
Cardiology Office Note   Date:  01/22/2020   ID:  [name], DOB 1923/06/10, MRN [id number]  PCP:  Kathleen Czar, MD  Cardiologist:   Minus Breeding, MD   Chief Complaint  Patient presents with  . Congestive Heart Failure      History of Present Illness: [name] is a 86 y.o. female who presents for followup of had tachybradycardia syndrome.  She was in the hospital last month with acute on chronic systolic and diastolic HF.  She had significant dementia and clear decline in her status.  The family met with palliative care and they were considering options.  She remained a full code during that admission.    Since she got home she has not needed to wear her oxygen as much.  Her saturations have been in the mid 90s.  She was wearing it at night but she is not having to do that anymore.  Her weight is staying down.  They are very much watching her salt.  She has home health nursing and PT.  Her dementia is improved since she has been home.  She is not having any chest pressure, neck or arm discomfort.  Is not having any PND or orthopnea.  She gets around with her walker.  Of note at discharge lisinopril was stopped.  Metoprolol stopped and she had an extra 40 mg of furosemide added.   Past Medical History:  Diagnosis Date  . Acute on chronic diastolic (congestive) heart failure ([place]) 01/22/2019  . Acute on chronic systolic CHF (congestive heart failure) ([place]) 03/04/2018  . Anemia   . Arthritis    "knees especially"  . Bradycardia   . Cardiomyopathy    presumed ischemic in the past with an EF of 35%. The most recent EF in 03-2008, however, was 55 -60%  . CHF 12/28/2007   Qualifier: Diagnosis of  By: Laney Potash, [name]    . Chronic anticoagulation 12/15/2015  . Chronic renal insufficiency   . Dementia ([place])    mild  . Dyslipidemia   . Encounter for therapeutic drug monitoring 12/25/2013  . Glaucoma   . Hypertension   . Malleolar fracture   . Pacemaker infection  ([place]) 08/18/2012  . Pacemaker- Medtronic 12/25/2011   DOI 1/13   . PAF (paroxysmal atrial fibrillation) ([place]) 04/19/2017  . Paroxysmal atrial fibrillation ([facility])   . Sinus node dysfunction ([place]) 12/28/2007   Qualifier: Diagnosis of  By: Laney Potash, [name]     . Tachycardia-bradycardia syndrome ([place]) 09/19/2011    Past Surgical History:  Procedure Laterality Date  . ABDOMINAL HYSTERECTOMY    . CARDIOVERSION  07/2011  . EYE SURGERY  2002   "blood clot behind eye"  . FRACTURE SURGERY  ~ 2007   RLE  . HEMORRHOID SURGERY    . INSERT / REPLACE / REMOVE PACEMAKER  09/17/11   initial placement  . INSERT / REPLACE / REMOVE PACEMAKER  09/18/11  . PACEMAKER REVISION N/A 09/18/2011   Procedure: PACEMAKER REVISION;  Surgeon: Thompson Grayer, MD;  Location: [facility] C[facility] LAB;  Service: Cardiovascular;  Laterality: N/A;  . PERMANENT PACEMAKER INSERTION N/A 09/17/2011   Procedure: PERMANENT PACEMAKER INSERTION;  Surgeon: Deboraha Sprang, MD;  Location: [facility] CATH LAB;  Service: Cardiovascular;  Laterality: N/A;  . TONSILLECTOMY AND ADENOIDECTOMY       Current Outpatient Medications  Medication Sig Dispense Refill  . acetaminophen (TYLENOL) 500 MG tablet Take 500 mg by mouth every 6 (six) hours as needed for headache (  pain).    . amiodarone (PACERONE) 200 MG tablet Take 1 tablet (200 mg total) by mouth daily. 30 tablet 1  . atorvastatin (LIPITOR) 10 MG tablet Take 0.5 tablets (5 mg total) by mouth at bedtime. 45 tablet 3  . brimonidine (ALPHAGAN) 0.2 % ophthalmic solution Place 1 drop into both eyes daily.    . Famotidine (PEPCID AC MAXIMUM STRENGTH) 20 MG CHEW Chew 1 tablet by mouth at bedtime.     . furosemide (LASIX) 40 MG tablet Take 1 tablet (40 mg total) by mouth every evening. 30 tablet 1  . furosemide (LASIX) 80 MG tablet Take 1 tablet (80 mg total) by mouth daily. 30 tablet 1  . levothyroxine (SYNTHROID) 25 MCG tablet Take 1 tablet (25 mcg total) by mouth daily before breakfast. 90 tablet 3  . Melatonin 3 MG CAPS  Take 3 mg by mouth at bedtime.     . mirtazapine (REMERON) 7.5 MG tablet Take 7.5 mg by mouth at bedtime.    . Multiple Vitamin (MULTIVITAMIN WITH MINERALS) TABS tablet Take 1 tablet by mouth daily.    . Olopatadine HCl (PATADAY) 0.2 % SOLN Place 1 drop into both eyes daily.     . potassium chloride (KLOR-CON) 20 MEQ packet Take 40 mEq by mouth 2 (two) times daily. 60 each 2  . senna (SENOKOT) 8.6 MG TABS tablet Take 1 tablet by mouth at bedtime as needed for mild constipation.    . Travoprost, BAK Free, (TRAVATAN) 0.004 % SOLN ophthalmic solution Place 1 drop into both eyes at bedtime.    Marland Kitchen warfarin (COUMADIN) 2 MG tablet TAKE 1 TABLET BY MOUTH DAILY OR AS DIRECTED BY COUMADIN CLINIC 90 tablet 1   No current facility-administered medications for this visit.    Allergies:   Patient has no known allergies.    ROS:  Please see the history of present illness.   Otherwise, review of systems are positive for none.   All other systems are reviewed and negative.    PHYSICAL EXAM: VS:  BP 134/74   Pulse 70   Temp (!) 97.4 F (36.3 C)   Resp 20   Ht 5' (1.524 m)   Wt 113 lb 6.4 oz (51.4 kg)   SpO2 98%   BMI 22.15 kg/m  , BMI Body mass index is 22.15 kg/m. GEN:  No distress, slightly frail appearing NECK:  No jugular venous distention at 90 degrees, waveform within normal limits, carotid upstroke brisk and symmetric, no bruits, no thyromegaly LYMPHATICS:  No cervical adenopathy LUNGS:  Clear to auscultation bilaterally BACK:  No CVA tenderness CHEST:  Unremarkable HEART:  S1 and S2 within normal limits, no S3, no S4, no clicks, no rubs, no murmurs ABD:  Positive bowel sounds normal in frequency in pitch, no bruits, no rebound, no guarding, unable to assess midline mass or bruit with the patient seated. EXT:  2 plus pulses throughout, no edema, no cyanosis no clubbing SKIN:  No rashes no nodules NEURO:  Cranial nerves II through XII grossly intact, motor grossly intact throughout PSYCH:   Cognitively intact, oriented to person place and time  EKG:  EKG is   ordered today. The ekg ordered today demonstrates atrioventricular paced rhythm 100% capture   Recent Labs: Lab Results  Component Value Date   TSH 4.230 10/20/2019   ALT 30 09/29/2019   AST 34 09/29/2019   ALKPHOS 70 09/29/2019   BILITOT 0.8 09/29/2019   PROT 6.6 09/29/2019   ALBUMIN 4.4 09/29/2019  Lipid Panel    Component Value Date/Time   CHOL 135 10/31/2015 1202   TRIG 55 10/31/2015 1202   HDL 96 10/31/2015 1202   CHOLHDL 1.4 10/31/2015 1202   VLDL 11 10/31/2015 1202   LDLCALC 28 10/31/2015 1202      Wt Readings from Last 3 Encounters:  01/22/20 113 lb 6.4 oz (51.4 kg)  01/02/20 114 lb 3.2 oz (51.8 kg)  12/07/19 119 lb (54 kg)      Other studies Reviewed: Additional studies/ records that were reviewed today include: Hospital discharge records. Review of the above records demonstrates:  Please see elsewhere in the note.     ASSESSMENT AND PLAN:  HTN:   The blood pressure is controlled.  No change in therapy.   Acute on chronic systolic and diastolic heart failure: The patient seems to be doing much better than when I saw her in the hospital.  I think they are very strict about salt.  I will check with her primary provider to see what her creatinine was when it was checked recently.  For now she will remain on the meds as listed.   Tachybrady syndrome:   She has no further symptoms.  No change in therapy.  Covid education: She has had her vaccine.    Current medicines are reviewed at length with the patient today.  The patient does not have concerns regarding medicines.  The following changes have been made:  no change  Labs/ tests ordered today include:  Orders Placed This Encounter  Procedures  . EKG 12-Lead     Disposition:   FU with me in 4 months.     Signed, Minus Breeding, MD  01/22/2020 1:10 PM     [facility]

## 2020-01-22 ENCOUNTER — Encounter: Payer: Self-pay | Admitting: Cardiology

## 2020-01-22 ENCOUNTER — Other Ambulatory Visit: Payer: Self-pay

## 2020-01-22 ENCOUNTER — Ambulatory Visit (INDEPENDENT_AMBULATORY_CARE_PROVIDER_SITE_OTHER): Payer: Medicare Other | Admitting: Cardiology

## 2020-01-22 VITALS — BP 134/74 | HR 70 | Temp 97.4°F | Resp 20 | Ht 60.0 in | Wt 113.4 lb

## 2020-01-22 DIAGNOSIS — I10 Essential (primary) hypertension: Secondary | ICD-10-CM

## 2020-01-22 DIAGNOSIS — I5043 Acute on chronic combined systolic (congestive) and diastolic (congestive) heart failure: Secondary | ICD-10-CM

## 2020-01-22 DIAGNOSIS — I495 Sick sinus syndrome: Secondary | ICD-10-CM

## 2020-01-22 DIAGNOSIS — Z7189 Other specified counseling: Secondary | ICD-10-CM

## 2020-01-22 NOTE — Patient Instructions (Signed)
Medication Instructions:  No changes *If you need a refill on your cardiac medications before your next appointment, please call your pharmacy*  Lab Work: None ordered this visit  Testing/Procedures: None ordered this visit  Follow-Up: At [facility], you and your health needs are our priority.  As part of our continuing mission to provide you with exceptional heart care, we have created designated Provider Care Teams.  These Care Teams include your primary Cardiologist (physician) and Advanced Practice Providers (APPs -  Physician Assistants and Nurse Practitioners) who all work together to provide you with the care you need, when you need it.  Your next appointment:   3 month(s)  The format for your next appointment:   In Person  Provider:   Minus Breeding, MD

## 2020-01-25 ENCOUNTER — Other Ambulatory Visit: Payer: Self-pay | Admitting: Internal Medicine

## 2020-02-02 ENCOUNTER — Ambulatory Visit (INDEPENDENT_AMBULATORY_CARE_PROVIDER_SITE_OTHER): Payer: Medicare Other | Admitting: Cardiovascular Disease

## 2020-02-02 DIAGNOSIS — I4891 Unspecified atrial fibrillation: Secondary | ICD-10-CM | POA: Diagnosis not present

## 2020-02-02 DIAGNOSIS — Z5181 Encounter for therapeutic drug level monitoring: Secondary | ICD-10-CM

## 2020-02-02 LAB — POCT INR: INR: 2.1 (ref 2.0–3.0)

## 2020-02-02 NOTE — Patient Instructions (Signed)
Description   Continue on same dosage 2mg  daily except 1mg  on Sundays. Recheck INR in 6 weeks.  Call our office if you have any concerns [phone].

## 2020-02-22 ENCOUNTER — Ambulatory Visit (INDEPENDENT_AMBULATORY_CARE_PROVIDER_SITE_OTHER): Payer: Medicare Other | Admitting: *Deleted

## 2020-02-22 DIAGNOSIS — I4891 Unspecified atrial fibrillation: Secondary | ICD-10-CM

## 2020-02-22 LAB — CUP PACEART REMOTE DEVICE CHECK
Battery Impedance: 912 Ohm
Battery Remaining Longevity: 63 mo
Battery Voltage: 2.77 V
Brady Statistic AP VP Percent: 5 %
Brady Statistic AP VS Percent: 93 %
Brady Statistic AS VP Percent: 0 %
Brady Statistic AS VS Percent: 1 %
Date Time Interrogation Session: 20210610075827
Implantable Lead Implant Date: 20130103
Implantable Lead Implant Date: 20130103
Implantable Lead Location: 753859
Implantable Lead Location: 753860
Implantable Lead Model: 1944
Implantable Lead Model: 1948
Implantable Pulse Generator Implant Date: 20130103
Lead Channel Impedance Value: 391 Ohm
Lead Channel Impedance Value: 746 Ohm
Lead Channel Pacing Threshold Amplitude: 0.375 V
Lead Channel Pacing Threshold Amplitude: 0.625 V
Lead Channel Pacing Threshold Pulse Width: 0.4 ms
Lead Channel Pacing Threshold Pulse Width: 0.4 ms
Lead Channel Setting Pacing Amplitude: 2 V
Lead Channel Setting Pacing Amplitude: 2.5 V
Lead Channel Setting Pacing Pulse Width: 0.4 ms
Lead Channel Setting Sensing Sensitivity: 5.6 mV

## 2020-02-23 NOTE — Progress Notes (Signed)
Remote pacemaker transmission.   

## 2020-02-29 ENCOUNTER — Other Ambulatory Visit: Payer: Self-pay | Admitting: Internal Medicine

## 2020-03-05 ENCOUNTER — Other Ambulatory Visit: Payer: Self-pay | Admitting: Cardiology

## 2020-03-13 ENCOUNTER — Ambulatory Visit (INDEPENDENT_AMBULATORY_CARE_PROVIDER_SITE_OTHER): Payer: Medicare Other | Admitting: *Deleted

## 2020-03-13 ENCOUNTER — Other Ambulatory Visit: Payer: Self-pay

## 2020-03-13 DIAGNOSIS — Z5181 Encounter for therapeutic drug level monitoring: Secondary | ICD-10-CM | POA: Diagnosis not present

## 2020-03-13 DIAGNOSIS — I4891 Unspecified atrial fibrillation: Secondary | ICD-10-CM

## 2020-03-13 LAB — POCT INR: INR: 2 (ref 2.0–3.0)

## 2020-03-13 NOTE — Patient Instructions (Signed)
Description   Continue taking same dosage 2mg  daily except 1mg  on Sundays. Recheck INR in 5 weeks.  Call our office if you have any concerns [phone].

## 2020-03-29 ENCOUNTER — Other Ambulatory Visit: Payer: Self-pay | Admitting: Cardiology

## 2020-04-01 ENCOUNTER — Telehealth: Payer: Self-pay | Admitting: Cardiology

## 2020-04-01 MED ORDER — AMIODARONE HCL 200 MG PO TABS: ORAL_TABLET | ORAL | 3 refills | Status: DC

## 2020-04-01 MED ORDER — POTASSIUM CHLORIDE CRYS ER 20 MEQ PO TBCR: 20.0000 meq | EXTENDED_RELEASE_TABLET | Freq: Two times a day (BID) | ORAL | 3 refills | Status: DC

## 2020-04-01 NOTE — Telephone Encounter (Signed)
[name] is calling requesting Lonn Georgia give her a call to discuss medications due to their being confusion on what medications [name] should be taking as well as how much since her last hospital visit. Please advise.

## 2020-04-01 NOTE — Telephone Encounter (Signed)
Spoke with pt daughter, medications discussed and refills sent to the pharmacy.

## 2020-04-17 ENCOUNTER — Ambulatory Visit (INDEPENDENT_AMBULATORY_CARE_PROVIDER_SITE_OTHER): Payer: Medicare Other

## 2020-04-17 ENCOUNTER — Other Ambulatory Visit: Payer: Self-pay

## 2020-04-17 DIAGNOSIS — I4891 Unspecified atrial fibrillation: Secondary | ICD-10-CM

## 2020-04-17 DIAGNOSIS — Z5181 Encounter for therapeutic drug level monitoring: Secondary | ICD-10-CM | POA: Diagnosis not present

## 2020-04-17 LAB — POCT INR: INR: 1.8 — AB (ref 2.0–3.0)

## 2020-04-17 NOTE — Patient Instructions (Signed)
Description   Take 3mg  today, then resume same dosage 2mg  daily except 1mg  on Sundays. Recheck INR in 2-3 weeks.  Call our office if you have any concerns [phone].

## 2020-04-28 NOTE — Progress Notes (Signed)
Cardiology Office Note   Date:  04/29/2020   ID:  [name], DOB 1923/09/05, MRN [id number]  PCP:  Chriss Czar, MD  Cardiologist:   Minus Breeding, MD   No chief complaint on file.      History of Present Illness: [name] is a 86 y.o. female who presents for followup of had tachybradycardia syndrome.  She was in the hospital in April with acute on chronic systolic and diastolic HF.  She had significant dementia and clear decline in her status.  The family met with palliative care and they were considering options.  She remained a full code during that admission.    She has home health aides that are there every day.  She did have palliative care nursing.  She is done well from a cardiovascular standpoint.  She is not using her oxygen.  She is not requiring it and her sats are staying high.  The patient denies any new symptoms such as chest discomfort, neck or arm discomfort. There has been no new shortness of breath, PND or orthopnea. There have been no reported palpitations, presyncope or syncope.  She gets around the house with a walker.  Of note her INR was subtherapeutic today and adjusted.  Per her daughter she has been anemic and is having stool guaiacs checked.  I do not have these labs.   Past Medical History:  Diagnosis Date  . Acute on chronic diastolic (congestive) heart failure ([place]) 01/22/2019  . Acute on chronic systolic CHF (congestive heart failure) ([place]) 03/04/2018  . Anemia   . Arthritis    "knees especially"  . Bradycardia   . Cardiomyopathy    presumed ischemic in the past with an EF of 35%. The most recent EF in 03-2008, however, was 55 -60%  . CHF 12/28/2007   Qualifier: Diagnosis of  By: Laney Potash, [name]    . Chronic anticoagulation 12/15/2015  . Chronic renal insufficiency   . Dementia ([place])    mild  . Dyslipidemia   . Encounter for therapeutic drug monitoring 12/25/2013  . Glaucoma   . Hypertension   . Malleolar fracture   .  Pacemaker infection ([place]) 08/18/2012  . Pacemaker- Medtronic 12/25/2011   DOI 1/13   . PAF (paroxysmal atrial fibrillation) ([place]) 04/19/2017  . Paroxysmal atrial fibrillation ([facility])   . Sinus node dysfunction ([place]) 12/28/2007   Qualifier: Diagnosis of  By: Laney Potash, [name]     . Tachycardia-bradycardia syndrome ([place]) 09/19/2011    Past Surgical History:  Procedure Laterality Date  . ABDOMINAL HYSTERECTOMY    . CARDIOVERSION  07/2011  . EYE SURGERY  2002   "blood clot behind eye"  . FRACTURE SURGERY  ~ 2007   RLE  . HEMORRHOID SURGERY    . INSERT / REPLACE / REMOVE PACEMAKER  09/17/11   initial placement  . INSERT / REPLACE / REMOVE PACEMAKER  09/18/11  . PACEMAKER REVISION N/A 09/18/2011   Procedure: PACEMAKER REVISION;  Surgeon: Thompson Grayer, MD;  Location: [facility] CATH LAB;  Service: Cardiovascular;  Laterality: N/A;  . PERMANENT PACEMAKER INSERTION N/A 09/17/2011   Procedure: PERMANENT PACEMAKER INSERTION;  Surgeon: Deboraha Sprang, MD;  Location: [facility] CATH LAB;  Service: Cardiovascular;  Laterality: N/A;  . TONSILLECTOMY AND ADENOIDECTOMY       Current Outpatient Medications  Medication Sig Dispense Refill  . acetaminophen (TYLENOL) 500 MG tablet Take 500 mg by mouth every 6 (six) hours as needed for headache (pain).    Marland Kitchen  amiodarone (PACERONE) 200 MG tablet TAKE 1 TABLET(200 MG) BY MOUTH DAILY 90 tablet 3  . atorvastatin (LIPITOR) 10 MG tablet Take 0.5 tablets (5 mg total) by mouth at bedtime. 45 tablet 3  . brimonidine (ALPHAGAN) 0.2 % ophthalmic solution Place 1 drop into both eyes daily.    . Famotidine (PEPCID AC MAXIMUM STRENGTH) 20 MG CHEW Chew 1 tablet by mouth at bedtime.     . furosemide (LASIX) 40 MG tablet TAKE 1 TABLET(40 MG) BY MOUTH EVERY EVENING. 90 tablet 2  . furosemide (LASIX) 80 MG tablet Take 1 tablet (80 mg total) by mouth daily. 30 tablet 1  . levothyroxine (SYNTHROID) 25 MCG tablet TAKE 1 TABLET(25 MCG) BY MOUTH DAILY BEFORE BREAKFAST 90 tablet 3  . Melatonin 3 MG CAPS  Take 3 mg by mouth at bedtime.     . mirtazapine (REMERON) 7.5 MG tablet Take 7.5 mg by mouth at bedtime.    . Multiple Vitamin (MULTIVITAMIN WITH MINERALS) TABS tablet Take 1 tablet by mouth daily.    . Olopatadine HCl (PATADAY) 0.2 % SOLN Place 1 drop into both eyes daily.     . potassium chloride SA (KLOR-CON) 20 MEQ tablet Take 1 tablet (20 mEq total) by mouth 2 (two) times daily. 180 tablet 3  . senna (SENOKOT) 8.6 MG TABS tablet Take 1 tablet by mouth at bedtime as needed for mild constipation.    . Travoprost, BAK Free, (TRAVATAN) 0.004 % SOLN ophthalmic solution Place 1 drop into both eyes at bedtime.    Marland Kitchen warfarin (COUMADIN) 2 MG tablet TAKE 1 TABLET BY MOUTH DAILY OR AS DIRECTED BY COUMADIN CLINIC 90 tablet 1   No current facility-administered medications for this visit.    Allergies:   Patient has no known allergies.    ROS:  Please see the history of present illness.   Otherwise, review of systems are positive for decreased hearing, joint pains.   All other systems are reviewed and negative.    PHYSICAL EXAM: VS:  BP 130/90 (BP Location: Left Arm, Patient Position: Sitting, Cuff Size: Normal)   Pulse (!) 52   Ht 5' (1.524 m)   Wt 109 lb (49.4 kg)   BMI 21.29 kg/m  , BMI Body mass index is 21.29 kg/m. GEN:  No distress NECK:  No jugular venous distention at 90 degrees, waveform within normal limits, carotid upstroke brisk and symmetric, no bruits, no thyromegaly LUNGS:  Clear to auscultation bilaterally BACK:  No CVA tenderness CHEST:  Unremarkable HEART:  S1 and S2 within normal limits, no S3, no S4, no clicks, no rubs, no murmurs ABD:  Positive bowel sounds normal in frequency in pitch, no bruits, no rebound, no guarding, unable to assess midline mass or bruit with the patient seated. EXT:  2 plus pulses throughout, no edema, no cyanosis no clubbing   EKG:  EKG is not ordered today.    Recent Labs: Lab Results  Component Value Date   TSH 4.230 10/20/2019   ALT  30 09/29/2019   AST 34 09/29/2019   ALKPHOS 70 09/29/2019   BILITOT 0.8 09/29/2019   PROT 6.6 09/29/2019   ALBUMIN 4.4 09/29/2019    Lipid Panel    Component Value Date/Time   CHOL 135 10/31/2015 1202   TRIG 55 10/31/2015 1202   HDL 96 10/31/2015 1202   CHOLHDL 1.4 10/31/2015 1202   VLDL 11 10/31/2015 1202   LDLCALC 28 10/31/2015 1202      Wt Readings from Last 3  Encounters:  04/29/20 109 lb (49.4 kg)  01/22/20 113 lb 6.4 oz (51.4 kg)  01/02/20 114 lb 3.2 oz (51.8 kg)      Other studies Reviewed: Additional studies/ records that were reviewed today include:  None Review of the above records demonstrates:  NA   ASSESSMENT AND PLAN:  HTN:    Her blood pressure is controlled.  No change in therapy.   Acute on chronic systolic and diastolic heart failure:   She seems to be euvolemic.  I would like to see the blood work from her primary provider that was drawn recently to make sure her renal function is stable as she does have some CKD.  However, she is doing very well compared to where she was earlier this year.  No change in therapy.   Tachybrady syndrome: She will continue with the amiodarone.  She is up-to-date with blood work.  Of note she is taking anticoagulation and it was adjusted today.  If however she has any active bleeding or progressive anemia as noted by her primary doctor she could or would have to stop her warfarin.  Covid education:     She has had her vaccine.     Current medicines are reviewed at length with the patient today.  The patient does not have concerns regarding medicines.  The following changes have been made:  no change  Labs/ tests ordered today include:  No orders of the defined types were placed in this encounter.    Disposition:   FU with me in 3 months.     Signed, Minus Breeding, MD  04/29/2020 12:22 PM    [facility]

## 2020-04-29 ENCOUNTER — Ambulatory Visit (INDEPENDENT_AMBULATORY_CARE_PROVIDER_SITE_OTHER): Payer: Medicare Other

## 2020-04-29 ENCOUNTER — Encounter: Payer: Self-pay | Admitting: Cardiology

## 2020-04-29 ENCOUNTER — Ambulatory Visit (INDEPENDENT_AMBULATORY_CARE_PROVIDER_SITE_OTHER): Payer: Medicare Other | Admitting: Cardiology

## 2020-04-29 ENCOUNTER — Other Ambulatory Visit: Payer: Self-pay

## 2020-04-29 ENCOUNTER — Other Ambulatory Visit: Payer: Self-pay | Admitting: Cardiology

## 2020-04-29 VITALS — BP 130/90 | HR 52 | Ht 60.0 in | Wt 109.0 lb

## 2020-04-29 DIAGNOSIS — I4891 Unspecified atrial fibrillation: Secondary | ICD-10-CM

## 2020-04-29 DIAGNOSIS — I10 Essential (primary) hypertension: Secondary | ICD-10-CM | POA: Diagnosis not present

## 2020-04-29 DIAGNOSIS — Z5181 Encounter for therapeutic drug level monitoring: Secondary | ICD-10-CM | POA: Diagnosis not present

## 2020-04-29 DIAGNOSIS — I5043 Acute on chronic combined systolic (congestive) and diastolic (congestive) heart failure: Secondary | ICD-10-CM | POA: Diagnosis not present

## 2020-04-29 DIAGNOSIS — I495 Sick sinus syndrome: Secondary | ICD-10-CM | POA: Diagnosis not present

## 2020-04-29 LAB — POCT INR: INR: 1.4 — AB (ref 2.0–3.0)

## 2020-04-29 NOTE — Patient Instructions (Addendum)
Medication Instructions:  No changes *If you need a refill on your cardiac medications before your next appointment, please call your pharmacy*  Lab Work: None ordered this visit  Testing/Procedures: None ordered this visit  Follow-Up: At [facility], you and your health needs are our priority.  As part of our continuing mission to provide you with exceptional heart care, we have created designated Provider Care Teams.  These Care Teams include your primary Cardiologist (physician) and Advanced Practice Providers (APPs -  Physician Assistants and Nurse Practitioners) who all work together to provide you with the care you need, when you need it.  Your next appointment:   Wednesday July 17, 2020 at 11:40am  The format for your next appointment:   In Person  Provider:   Minus Breeding, MD

## 2020-04-29 NOTE — Patient Instructions (Signed)
Take 2 tablets today then resume same dosage 2mg  daily except 1mg  on Sundays. Recheck INR in 3 weeks.  Call our office if you have any concerns [phone]

## 2020-05-22 ENCOUNTER — Other Ambulatory Visit: Payer: Self-pay

## 2020-05-22 ENCOUNTER — Ambulatory Visit (INDEPENDENT_AMBULATORY_CARE_PROVIDER_SITE_OTHER): Payer: Medicare Other | Admitting: *Deleted

## 2020-05-22 DIAGNOSIS — Z5181 Encounter for therapeutic drug level monitoring: Secondary | ICD-10-CM | POA: Diagnosis not present

## 2020-05-22 DIAGNOSIS — I4891 Unspecified atrial fibrillation: Secondary | ICD-10-CM

## 2020-05-22 LAB — POCT INR: INR: 2.2 (ref 2.0–3.0)

## 2020-05-22 NOTE — Patient Instructions (Signed)
Description   Continue taking 2mg  daily except 1mg  on Sundays. Recheck INR in 4 weeks.  Call our office if you have any concerns [phone].

## 2020-05-23 ENCOUNTER — Ambulatory Visit (INDEPENDENT_AMBULATORY_CARE_PROVIDER_SITE_OTHER): Payer: Medicare Other | Admitting: *Deleted

## 2020-05-23 DIAGNOSIS — I495 Sick sinus syndrome: Secondary | ICD-10-CM

## 2020-05-23 LAB — CUP PACEART REMOTE DEVICE CHECK
Battery Impedance: 1014 Ohm
Battery Remaining Longevity: 59 mo
Battery Voltage: 2.77 V
Brady Statistic AP VP Percent: 5 %
Brady Statistic AP VS Percent: 93 %
Brady Statistic AS VP Percent: 0 %
Brady Statistic AS VS Percent: 2 %
Date Time Interrogation Session: 20210909080820
Implantable Lead Implant Date: 20130103
Implantable Lead Implant Date: 20130103
Implantable Lead Location: 753859
Implantable Lead Location: 753860
Implantable Lead Model: 1944
Implantable Lead Model: 1948
Implantable Pulse Generator Implant Date: 20130103
Lead Channel Impedance Value: 381 Ohm
Lead Channel Impedance Value: 686 Ohm
Lead Channel Pacing Threshold Amplitude: 0.375 V
Lead Channel Pacing Threshold Amplitude: 0.5 V
Lead Channel Pacing Threshold Pulse Width: 0.4 ms
Lead Channel Pacing Threshold Pulse Width: 0.4 ms
Lead Channel Setting Pacing Amplitude: 2 V
Lead Channel Setting Pacing Amplitude: 2.5 V
Lead Channel Setting Pacing Pulse Width: 0.4 ms
Lead Channel Setting Sensing Sensitivity: 5.6 mV

## 2020-05-24 NOTE — Progress Notes (Signed)
Remote pacemaker transmission.   

## 2020-06-08 ENCOUNTER — Other Ambulatory Visit: Payer: Self-pay | Admitting: Cardiology

## 2020-06-19 ENCOUNTER — Other Ambulatory Visit: Payer: Self-pay

## 2020-06-19 ENCOUNTER — Ambulatory Visit (INDEPENDENT_AMBULATORY_CARE_PROVIDER_SITE_OTHER): Payer: Medicare Other

## 2020-06-19 DIAGNOSIS — Z5181 Encounter for therapeutic drug level monitoring: Secondary | ICD-10-CM | POA: Diagnosis not present

## 2020-06-19 DIAGNOSIS — I4891 Unspecified atrial fibrillation: Secondary | ICD-10-CM | POA: Diagnosis not present

## 2020-06-19 LAB — POCT INR: INR: 2.2 (ref 2.0–3.0)

## 2020-06-19 NOTE — Patient Instructions (Signed)
Description   Continue taking 2mg  daily except 1mg  on Sundays. Recheck INR in 4 weeks.  Call our office if you have any concerns [phone].

## 2020-07-15 NOTE — Progress Notes (Signed)
Cardiology Office Note   Date:  07/17/2020   ID:  [name], DOB 30-Oct-1922, MRN [id number]  PCP:  Chriss Czar, MD  Cardiologist:   Minus Breeding, MD   No chief complaint on file.      History of Present Illness: [name] is a 86 y.o. female who presents for followup of had tachybradycardia syndrome.  She was in the hospital in April with acute on chronic systolic and diastolic HF.  She had significant dementia and clear decline in her status.  The family met with palliative care and they were considering options.  She remained a full code during that admission.  She was actually doing well when I last saw her.   Since I last saw her she has done well. The patient denies any new symptoms such as chest discomfort, neck or arm discomfort. There has been no new shortness of breath, PND or orthopnea. There have been no reported palpitations, presyncope or syncope.   Past Medical History:  Diagnosis Date  . Acute on chronic diastolic (congestive) heart failure ([place]) 01/22/2019  . Acute on chronic systolic CHF (congestive heart failure) ([place]) 03/04/2018  . Anemia   . Arthritis    "knees especially"  . Bradycardia   . Cardiomyopathy    presumed ischemic in the past with an EF of 35%. The most recent EF in 03-2008, however, was 55 -60%  . CHF 12/28/2007   Qualifier: Diagnosis of  By: Laney Potash, [name]    . Chronic anticoagulation 12/15/2015  . Chronic renal insufficiency   . Dementia ([place])    mild  . Dyslipidemia   . Encounter for therapeutic drug monitoring 12/25/2013  . Glaucoma   . Hypertension   . Malleolar fracture   . Pacemaker infection ([place]) 08/18/2012  . Pacemaker- Medtronic 12/25/2011   DOI 1/13   . PAF (paroxysmal atrial fibrillation) ([place]) 04/19/2017  . Paroxysmal atrial fibrillation (HCC)   . Sinus node dysfunction ([place]) 12/28/2007   Qualifier: Diagnosis of  By: Laney Potash, [name]     . Tachycardia-bradycardia syndrome ([place]) 09/19/2011    Past Surgical  History:  Procedure Laterality Date  . ABDOMINAL HYSTERECTOMY    . CARDIOVERSION  07/2011  . EYE SURGERY  2002   "blood clot behind eye"  . FRACTURE SURGERY  ~ 2007   RLE  . HEMORRHOID SURGERY    . INSERT / REPLACE / REMOVE PACEMAKER  09/17/11   initial placement  . INSERT / REPLACE / REMOVE PACEMAKER  09/18/11  . PACEMAKER REVISION N/A 09/18/2011   Procedure: PACEMAKER REVISION;  Surgeon: Thompson Grayer, MD;  Location: [facility] CATH LAB;  Service: Cardiovascular;  Laterality: N/A;  . PERMANENT PACEMAKER INSERTION N/A 09/17/2011   Procedure: PERMANENT PACEMAKER INSERTION;  Surgeon: Deboraha Sprang, MD;  Location: [facility] CATH LAB;  Service: Cardiovascular;  Laterality: N/A;  . TONSILLECTOMY AND ADENOIDECTOMY       Current Outpatient Medications  Medication Sig Dispense Refill  . acetaminophen (TYLENOL) 500 MG tablet Take 325 mg by mouth every 6 (six) hours as needed for headache (pain).     Marland Kitchen amiodarone (PACERONE) 200 MG tablet TAKE 1 TABLET(200 MG) BY MOUTH DAILY 90 tablet 3  . atorvastatin (LIPITOR) 10 MG tablet Take 0.5 tablets (5 mg total) by mouth at bedtime. 45 tablet 3  . brimonidine (ALPHAGAN) 0.2 % ophthalmic solution Place 1 drop into both eyes daily.    . Famotidine (PEPCID AC MAXIMUM STRENGTH) 20 MG CHEW Chew 1 tablet by  mouth at bedtime.     . furosemide (LASIX) 40 MG tablet TAKE 1 TABLET(40 MG) BY MOUTH EVERY EVENING. 90 tablet 2  . furosemide (LASIX) 80 MG tablet TAKE 1 TABLET(80 MG) BY MOUTH DAILY 90 tablet 3  . IRON, FERROUS GLUCONATE, PO Take by mouth daily.    Marland Kitchen levothyroxine (SYNTHROID) 25 MCG tablet TAKE 1 TABLET(25 MCG) BY MOUTH DAILY BEFORE BREAKFAST 90 tablet 3  . Melatonin 3 MG CAPS Take 3 mg by mouth at bedtime.     . mirtazapine (REMERON) 7.5 MG tablet Take 7.5 mg by mouth at bedtime.    . Multiple Vitamin (MULTIVITAMIN WITH MINERALS) TABS tablet Take 1 tablet by mouth daily.    . Olopatadine HCl (PATADAY) 0.2 % SOLN Place 1 drop into both eyes daily.     . potassium chloride  SA (KLOR-CON) 20 MEQ tablet Take 1 tablet (20 mEq total) by mouth 2 (two) times daily. 180 tablet 3  . senna (SENOKOT) 8.6 MG TABS tablet Take 1 tablet by mouth at bedtime as needed for mild constipation.    . Travoprost, BAK Free, (TRAVATAN) 0.004 % SOLN ophthalmic solution Place 1 drop into both eyes at bedtime.    Marland Kitchen warfarin (COUMADIN) 2 MG tablet TAKE 1 TABLET BY MOUTH DAILY OR AS DIRECTED BY COUMADIN CLINIC 90 tablet 1   No current facility-administered medications for this visit.    Allergies:   Patient has no known allergies.    ROS:  Please see the history of present illness.   Otherwise, review of systems are positive for none.   All other systems are reviewed and negative.    PHYSICAL EXAM: VS:  BP (!) 142/82 (BP Location: Left Arm, Patient Position: Sitting)   Ht 5' (1.524 m)   Wt 109 lb 9.6 oz (49.7 kg)   SpO2 99%   BMI 21.40 kg/m  , BMI Body mass index is 21.4 kg/m. GENERAL:  Well appearing NECK:  No jugular venous distention, waveform within normal limits, carotid upstroke brisk and symmetric, no bruits, no thyromegaly LUNGS:  Clear to auscultation bilaterally CHEST:  Unremarkable HEART:  PMI not displaced or sustained,S1 and S2 within normal limits, no S3, no S4, no clicks, no rubs, 3 out of 6 apical systolic murmur radiating slightly at the aortic outflow tract, no diastolic murmurs ABD:  Flat, positive bowel sounds normal in frequency in pitch, no bruits, no rebound, no guarding, no midline pulsatile mass, no hepatomegaly, no splenomegaly EXT:  2 plus pulses throughout, no edema, no cyanosis no clubbing    EKG:  EKG is ordered today. Atrial ventricular pacing 100% capture   Recent Labs: Lab Results  Component Value Date   TSH 4.230 10/20/2019   ALT 30 09/29/2019   AST 34 09/29/2019   ALKPHOS 70 09/29/2019   BILITOT 0.8 09/29/2019   PROT 6.6 09/29/2019   ALBUMIN 4.4 09/29/2019    Lipid Panel    Component Value Date/Time   CHOL 135 10/31/2015 1202    TRIG 55 10/31/2015 1202   HDL 96 10/31/2015 1202   CHOLHDL 1.4 10/31/2015 1202   VLDL 11 10/31/2015 1202   LDLCALC 28 10/31/2015 1202      Wt Readings from Last 3 Encounters:  07/17/20 109 lb 9.6 oz (49.7 kg)  04/29/20 109 lb (49.4 kg)  01/22/20 113 lb 6.4 oz (51.4 kg)      Other studies Reviewed: Additional studies/ records that were reviewed today include:  Labs Review of the above records demonstrates:  See elsewhere   ASSESSMENT AND PLAN:  HTN:    Her blood pressure is controlled.  No change in therapy.   Chronic systolic and diastolic heart failure:   She seems to be euvolemic.  No change in therapy.  Tachybrady syndrome:   She has no symptoms related to this.  She is up-to-date with the labs associated with amiodarone.  She tolerates her anticoagulation.  She has been anemic and is on iron but there is been no apparent evidence of bleeding.    Current medicines are reviewed at length with the patient today.  The patient does not have concerns regarding medicines.  The following changes have been made:  no change  Labs/ tests ordered today include:  Orders Placed This Encounter  Procedures  . EKG 12-Lead     Disposition:   FU with me in 3 months.     Signed, Minus Breeding, MD  07/17/2020 1:02 PM    [place]

## 2020-07-17 ENCOUNTER — Ambulatory Visit (INDEPENDENT_AMBULATORY_CARE_PROVIDER_SITE_OTHER): Payer: Medicare Other | Admitting: Cardiology

## 2020-07-17 ENCOUNTER — Encounter: Payer: Self-pay | Admitting: Cardiology

## 2020-07-17 ENCOUNTER — Other Ambulatory Visit: Payer: Self-pay

## 2020-07-17 ENCOUNTER — Ambulatory Visit (INDEPENDENT_AMBULATORY_CARE_PROVIDER_SITE_OTHER): Payer: Medicare Other

## 2020-07-17 VITALS — BP 142/82 | Ht 60.0 in | Wt 109.6 lb

## 2020-07-17 DIAGNOSIS — I4891 Unspecified atrial fibrillation: Secondary | ICD-10-CM

## 2020-07-17 DIAGNOSIS — Z5181 Encounter for therapeutic drug level monitoring: Secondary | ICD-10-CM

## 2020-07-17 DIAGNOSIS — I5032 Chronic diastolic (congestive) heart failure: Secondary | ICD-10-CM | POA: Diagnosis not present

## 2020-07-17 DIAGNOSIS — I495 Sick sinus syndrome: Secondary | ICD-10-CM | POA: Diagnosis not present

## 2020-07-17 LAB — POCT INR: INR: 2 (ref 2.0–3.0)

## 2020-07-17 NOTE — Patient Instructions (Signed)
Continue taking 2mg  daily except 1mg  on Sundays. Recheck INR in 6 weeks.  Call our office if you have any concerns [phone].

## 2020-07-17 NOTE — Patient Instructions (Signed)
Medication Instructions:  No changes *If you need a refill on your cardiac medications before your next appointment, please call your pharmacy*   Lab Work: Not needed If you have labs (blood work) drawn today and your tests are completely normal, you will receive your results only by: Marland Kitchen MyChart Message (if you have MyChart) OR . A paper copy in the mail If you have any lab test that is abnormal or we need to change your treatment, we will call you to review the results.   Testing/Procedures: Not needed   Follow-Up: At [facility], you and your health needs are our priority.  As part of our continuing mission to provide you with exceptional heart care, we have created designated Provider Care Teams.  These Care Teams include your primary Cardiologist (physician) and Advanced Practice Providers (APPs -  Physician Assistants and Nurse Practitioners) who all work together to provide you with the care you need, when you need it.  We recommend signing up for the patient portal called "MyChart".  Sign up information is provided on this After Visit Summary.  MyChart is used to connect with patients for Virtual Visits (Telemedicine).  Patients are able to view lab/test results, encounter notes, upcoming appointments, etc.  Non-urgent messages can be sent to your provider as well.   To learn more about what you can do with MyChart, go to [email or web address].    Your next appointment:   3 month(s)  The format for your next appointment:   In Person  Provider:   Minus Breeding, MD

## 2020-07-31 ENCOUNTER — Other Ambulatory Visit: Payer: Self-pay | Admitting: Cardiology

## 2020-08-22 ENCOUNTER — Ambulatory Visit (INDEPENDENT_AMBULATORY_CARE_PROVIDER_SITE_OTHER): Payer: Medicare Other

## 2020-08-22 DIAGNOSIS — I495 Sick sinus syndrome: Secondary | ICD-10-CM | POA: Diagnosis not present

## 2020-08-22 LAB — CUP PACEART REMOTE DEVICE CHECK
Battery Impedance: 1144 Ohm
Battery Remaining Longevity: 54 mo
Battery Voltage: 2.76 V
Brady Statistic AP VP Percent: 17 %
Brady Statistic AP VS Percent: 80 %
Brady Statistic AS VP Percent: 1 %
Brady Statistic AS VS Percent: 1 %
Date Time Interrogation Session: 20211209081535
Implantable Lead Implant Date: 20130103
Implantable Lead Implant Date: 20130103
Implantable Lead Location: 753859
Implantable Lead Location: 753860
Implantable Lead Model: 1944
Implantable Lead Model: 1948
Implantable Pulse Generator Implant Date: 20130103
Lead Channel Impedance Value: 401 Ohm
Lead Channel Impedance Value: 686 Ohm
Lead Channel Pacing Threshold Amplitude: 0.375 V
Lead Channel Pacing Threshold Amplitude: 0.625 V
Lead Channel Pacing Threshold Pulse Width: 0.4 ms
Lead Channel Pacing Threshold Pulse Width: 0.4 ms
Lead Channel Setting Pacing Amplitude: 2 V
Lead Channel Setting Pacing Amplitude: 2.5 V
Lead Channel Setting Pacing Pulse Width: 0.4 ms
Lead Channel Setting Sensing Sensitivity: 5.6 mV

## 2020-08-28 ENCOUNTER — Other Ambulatory Visit: Payer: Self-pay

## 2020-08-28 ENCOUNTER — Ambulatory Visit (INDEPENDENT_AMBULATORY_CARE_PROVIDER_SITE_OTHER): Payer: Medicare Other | Admitting: *Deleted

## 2020-08-28 DIAGNOSIS — Z5181 Encounter for therapeutic drug level monitoring: Secondary | ICD-10-CM

## 2020-08-28 DIAGNOSIS — I4891 Unspecified atrial fibrillation: Secondary | ICD-10-CM | POA: Diagnosis not present

## 2020-08-28 LAB — POCT INR: INR: 1.5 — AB (ref 2.0–3.0)

## 2020-08-28 NOTE — Patient Instructions (Addendum)
Description   Today take 3mg  (1.5 tablets) and 3mg  (1.5 tablets) tomorrow then continue taking 2mg  daily except 1mg  on Sundays. Recheck INR in 2 weeks (normally 6 weeks).  Call our office if you have any concerns [phone].

## 2020-09-03 NOTE — Progress Notes (Signed)
Remote pacemaker transmission.   

## 2020-09-11 ENCOUNTER — Ambulatory Visit (INDEPENDENT_AMBULATORY_CARE_PROVIDER_SITE_OTHER): Payer: Medicare Other | Admitting: *Deleted

## 2020-09-11 ENCOUNTER — Other Ambulatory Visit: Payer: Self-pay

## 2020-09-11 DIAGNOSIS — Z5181 Encounter for therapeutic drug level monitoring: Secondary | ICD-10-CM

## 2020-09-11 DIAGNOSIS — I4891 Unspecified atrial fibrillation: Secondary | ICD-10-CM

## 2020-09-11 LAB — POCT INR: INR: 1.7 — AB (ref 2.0–3.0)

## 2020-09-11 NOTE — Patient Instructions (Addendum)
Description   Today take 3mg  (1.5 tablets) and 3mg  (1.5 tablets) tomorrow then start taking 2mg  (1 tablet) daily. Recheck INR in 2 weeks (normally 6 weeks).  Call our office if you have any concerns [phone].

## 2020-09-25 ENCOUNTER — Other Ambulatory Visit: Payer: Self-pay

## 2020-09-25 ENCOUNTER — Ambulatory Visit (INDEPENDENT_AMBULATORY_CARE_PROVIDER_SITE_OTHER): Payer: Medicare Other | Admitting: *Deleted

## 2020-09-25 DIAGNOSIS — Z5181 Encounter for therapeutic drug level monitoring: Secondary | ICD-10-CM

## 2020-09-25 DIAGNOSIS — I4891 Unspecified atrial fibrillation: Secondary | ICD-10-CM | POA: Diagnosis not present

## 2020-09-25 LAB — POCT INR: INR: 2.3 (ref 2.0–3.0)

## 2020-09-25 NOTE — Patient Instructions (Signed)
Description   Continue taking Warfarin 2mg  (1 tablet) daily. Recheck INR in 3 weeks. Call our office if you have any concerns [phone].

## 2020-09-26 ENCOUNTER — Other Ambulatory Visit: Payer: Self-pay | Admitting: Cardiology

## 2020-10-16 ENCOUNTER — Other Ambulatory Visit: Payer: Self-pay

## 2020-10-16 ENCOUNTER — Ambulatory Visit (INDEPENDENT_AMBULATORY_CARE_PROVIDER_SITE_OTHER): Payer: Medicare Other

## 2020-10-16 DIAGNOSIS — Z5181 Encounter for therapeutic drug level monitoring: Secondary | ICD-10-CM

## 2020-10-16 DIAGNOSIS — I4891 Unspecified atrial fibrillation: Secondary | ICD-10-CM | POA: Diagnosis not present

## 2020-10-16 LAB — POCT INR: INR: 2.4 (ref 2.0–3.0)

## 2020-10-16 NOTE — Patient Instructions (Signed)
Description   Continue taking Warfarin '2mg'$  (1 tablet) daily. Recheck INR in 4 weeks. Call our office if you have any concerns [phone].

## 2020-10-27 NOTE — Progress Notes (Signed)
Cardiology Office Note   Date:  10/28/2020   ID:  [name], DOB 07-Jul-1923, MRN [id number]  PCP:  Chriss Czar, MD  Cardiologist:   Minus Breeding, MD   Chief Complaint  Patient presents with   Coronary Artery Disease       History of Present Illness: [name] is a 87 y.o. female who presents for followup of had tachybradycardia syndrome.  She was in the hospital in April 2021 with acute on chronic systolic and diastolic HF.  Since I last saw her she has done well.  Her daughter says she is anxious but she denies that.  There is some question of depression but the patient denies that.  She says "I am blessed."  She is in atrial flutter today but she does not report palpitations, presyncope or syncope.  She does not have chest pressure, neck or arm discomfort.  She has no new shortness of breath, PND or orthopnea.  Past Medical History:  Diagnosis Date   Acute on chronic diastolic (congestive) heart failure ([place]) 01/22/2019   Acute on chronic systolic CHF (congestive heart failure) ([place]) 03/04/2018   Anemia    Arthritis    "knees especially"   Bradycardia    Cardiomyopathy    presumed ischemic in the past with an EF of 35%. The most recent EF in 03-2008, however, was 75 -60%   CHF 12/28/2007   Qualifier: Diagnosis of  By: Laney Potash, [name]     Chronic anticoagulation 12/15/2015   Chronic renal insufficiency    Dementia ([place])    mild   Dyslipidemia    Encounter for therapeutic drug monitoring 12/25/2013   Glaucoma    Hypertension    Malleolar fracture    Pacemaker infection ([place]) 08/18/2012   Pacemaker- Medtronic 12/25/2011   DOI 1/13    PAF (paroxysmal atrial fibrillation) ([place]) 04/19/2017   Paroxysmal atrial fibrillation (HCC)    Sinus node dysfunction ([place]) 12/28/2007   Qualifier: Diagnosis of  By: Laney Potash, [name]      Tachycardia-bradycardia syndrome ([place]) 09/19/2011    Past Surgical History:  Procedure Laterality Date   ABDOMINAL  HYSTERECTOMY     CARDIOVERSION  07/2011   EYE SURGERY  2002   "blood clot behind eye"   FRACTURE SURGERY  ~ 2007   RLE   HEMORRHOID SURGERY     INSERT / REPLACE / REMOVE PACEMAKER  09/17/11   initial placement   INSERT / REPLACE / REMOVE PACEMAKER  09/18/11   PACEMAKER REVISION N/A 09/18/2011   Procedure: PACEMAKER REVISION;  Surgeon: Thompson Grayer, MD;  Location: [facility] CATH LAB;  Service: Cardiovascular;  Laterality: N/A;   PERMANENT PACEMAKER INSERTION N/A 09/17/2011   Procedure: PERMANENT PACEMAKER INSERTION;  Surgeon: Deboraha Sprang, MD;  Location: [facility] C[facility] LAB;  Service: Cardiovascular;  Laterality: N/A;   TONSILLECTOMY AND ADENOIDECTOMY       Current Outpatient Medications  Medication Sig Dispense Refill   acetaminophen (TYLENOL) 500 MG tablet Take 325 mg by mouth every 6 (six) hours as needed for headache (pain).      amiodarone (PACERONE) 200 MG tablet TAKE 1 TABLET(200 MG) BY MOUTH DAILY 90 tablet 3   atorvastatin (LIPITOR) 10 MG tablet TAKE 1/2 TABLET(5 MG) BY MOUTH AT BEDTIME 45 tablet 3   brimonidine (ALPHAGAN) 0.2 % ophthalmic solution Place 1 drop into both eyes daily.     Famotidine (PEPCID AC MAXIMUM STRENGTH) 20 MG CHEW Chew 1 tablet by mouth at bedtime.  furosemide (LASIX) 40 MG tablet TAKE 1 TABLET(40 MG) BY MOUTH EVERY EVENING. 90 tablet 2   furosemide (LASIX) 80 MG tablet TAKE 1 TABLET(80 MG) BY MOUTH DAILY 90 tablet 3   IRON, FERROUS GLUCONATE, PO Take by mouth daily.     levothyroxine (SYNTHROID) 25 MCG tablet TAKE 1 TABLET(25 MCG) BY MOUTH DAILY BEFORE BREAKFAST 90 tablet 3   Melatonin 3 MG CAPS Take 3 mg by mouth at bedtime.      mirtazapine (REMERON) 7.5 MG tablet Take 7.5 mg by mouth at bedtime.     Multiple Vitamin (MULTIVITAMIN WITH MINERALS) TABS tablet Take 1 tablet by mouth daily.     Olopatadine HCl 0.2 % SOLN Place 1 drop into both eyes daily.      potassium chloride SA (KLOR-CON) 20 MEQ tablet Take 1 tablet (20 mEq total) by mouth 2  (two) times daily. 180 tablet 3   senna (SENOKOT) 8.6 MG TABS tablet Take 1 tablet by mouth at bedtime as needed for mild constipation.     Travoprost, BAK Free, (TRAVATAN) 0.004 % SOLN ophthalmic solution Place 1 drop into both eyes at bedtime.     warfarin (COUMADIN) 2 MG tablet TAKE 1 TABLET BY MOUTH DAILY AS DIRECTED BY COUMADIN CLINIC 90 tablet 1   No current facility-administered medications for this visit.    Allergies:   Patient has no known allergies.    ROS:  Please see the history of present illness.   Otherwise, review of systems are positive for none.   All other systems are reviewed and negative.    PHYSICAL EXAM: VS:  BP 131/76    Pulse 62    Ht '4\' 11"'$  (1.499 m)    Wt 107 lb (48.5 kg)    SpO2 99%    BMI 21.61 kg/m  , BMI Body mass index is 21.61 kg/m. GENERAL:  Well appearing NECK:  No jugular venous distention, waveform within normal limits, carotid upstroke brisk and symmetric, no bruits, no thyromegaly LUNGS:  Clear to auscultation bilaterally CHEST:  Unremarkable HEART:  PMI not displaced or sustained,S1 and S2 within normal limits, no S3, no S4, no clicks, no rubs, no murmurs ABD:  Flat, positive bowel sounds normal in frequency in pitch, no bruits, no rebound, no guarding, no midline pulsatile mass, no hepatomegaly, no splenomegaly EXT:  2 plus pulses throughout, no edema, no cyanosis no clubbing   EKG:  EKG is  ordered today. Atrial flutter with ventricular paced rhythm rate 62   Recent Labs: Lab Results  Component Value Date   TSH 4.230 10/20/2019   ALT 30 09/29/2019   AST 34 09/29/2019   ALKPHOS 70 09/29/2019   BILITOT 0.8 09/29/2019   PROT 6.6 09/29/2019   ALBUMIN 4.4 09/29/2019    Lipid Panel    Component Value Date/Time   CHOL 135 10/31/2015 1202   TRIG 55 10/31/2015 1202   HDL 96 10/31/2015 1202   CHOLHDL 1.4 10/31/2015 1202   VLDL 11 10/31/2015 1202   LDLCALC 28 10/31/2015 1202      Wt Readings from Last 3 Encounters:  10/28/20  107 lb (48.5 kg)  07/17/20 109 lb 9.6 oz (49.7 kg)  04/29/20 109 lb (49.4 kg)      Other studies Reviewed: Additional studies/ records that were reviewed today include:  Labs Review of the above records demonstrates: See elsewhere   ASSESSMENT AND PLAN:  HTN:    Her blood pressure is controlled.  No change in therapy.  Chronic  systolic and diastolic heart failure:   She seems to be euvolemic.  No change in therapy.  She is up-to-date with blood work.  Tachybrady syndrome:   I did review her December remote recording and she is out of sinus rhythm about 3.6% of the time.  She is in flutter today but she really has no symptoms related to this.  She tolerates anticoagulation.  I will consider the amiodarone still successful and she does have follow-up in April with Dr. Caryl Comes.  She is up-to-date with her amiodarone blood work.  No change in therapy.    Current medicines are reviewed at length with the patient today.  No change  The following changes have been made:   None  Labs/ tests ordered today include:  None  Orders Placed This Encounter  Procedures   EKG 12-Lead     Disposition:   FU with me in 3 months.     Signed, Minus Breeding, MD  10/28/2020 12:41 PM    [facility]

## 2020-10-28 ENCOUNTER — Encounter: Payer: Self-pay | Admitting: Cardiology

## 2020-10-28 ENCOUNTER — Other Ambulatory Visit: Payer: Self-pay

## 2020-10-28 ENCOUNTER — Ambulatory Visit (INDEPENDENT_AMBULATORY_CARE_PROVIDER_SITE_OTHER): Payer: Medicare Other | Admitting: Cardiology

## 2020-10-28 ENCOUNTER — Other Ambulatory Visit: Payer: Self-pay | Admitting: Cardiology

## 2020-10-28 VITALS — BP 131/76 | HR 62 | Ht 59.0 in | Wt 107.0 lb

## 2020-10-28 DIAGNOSIS — I5032 Chronic diastolic (congestive) heart failure: Secondary | ICD-10-CM

## 2020-10-28 DIAGNOSIS — I495 Sick sinus syndrome: Secondary | ICD-10-CM

## 2020-10-28 NOTE — Patient Instructions (Signed)
Medication Instructions:  No changes  Follow-Up: At [facility], you and your health needs are our priority.  As part of our continuing mission to provide you with exceptional heart care, we have created designated Provider Care Teams.  These Care Teams include your primary Cardiologist (physician) and Advanced Practice Providers (APPs -  Physician Assistants and Nurse Practitioners) who all work together to provide you with the care you need, when you need it.  Your next appointment:   3 month(s)  The format for your next appointment:   In Person  Provider:   Minus Breeding, MD

## 2020-10-31 ENCOUNTER — Telehealth: Payer: Self-pay | Admitting: Internal Medicine

## 2020-10-31 NOTE — Telephone Encounter (Signed)
Spoke with pt's daughter, DPR who states pt had labs with PCP last week.  Pt was contacted and advised based on labs will need to increase Levothyroxine to 93mg.  Daughter is asking if it is ok to increase this medication since Dr KCaryl Comesstarted it. Daughter states she requested labs be sent to Dr KCaryl Comesfor review.     Advised medication increase was based on pt's recent labs and her thyroid function and to follow PCP's recommendation for med increase. Will await lab results from PCP office.  Daughter verbalizes understanding and agrees with current plan.Daughter states she will call next week to ensure we received lab report.

## 2020-10-31 NOTE — Telephone Encounter (Signed)
Pt c/o medication issue:  1. Name of Medication: levothyroxine (SYNTHROID) 25 MCG tablet  2. How are you currently taking this medication (dosage and times per day)? As prescribed   3. Are you having a reaction (difficulty breathing--STAT)? Yes   4. What is your medication issue? [name] is calling stating [name]'s PCP office is wanting to increase this medication to 50 MG's daily. They are wanting Dr. Olin Pia approval due to him being the one that prescribed it before making this change. Please advise.

## 2020-11-06 ENCOUNTER — Telehealth: Payer: Self-pay | Admitting: Pharmacist

## 2020-11-06 NOTE — Telephone Encounter (Signed)
Patient called to report that she has a little bit of blood in her urine today. I advised that if she continues to have blood in her urine, she needs to call her PCP office for a workup.   I called pt back to offer her a sooner apt to get her INR checked. Spoke with daughter (per DPR) and changed her apt to tomorrow to have INR checked.

## 2020-11-07 ENCOUNTER — Other Ambulatory Visit: Payer: Self-pay

## 2020-11-07 ENCOUNTER — Ambulatory Visit (INDEPENDENT_AMBULATORY_CARE_PROVIDER_SITE_OTHER): Payer: Medicare Other | Admitting: *Deleted

## 2020-11-07 DIAGNOSIS — I4891 Unspecified atrial fibrillation: Secondary | ICD-10-CM

## 2020-11-07 DIAGNOSIS — Z5181 Encounter for therapeutic drug level monitoring: Secondary | ICD-10-CM

## 2020-11-07 LAB — POCT INR: INR: 2.3 (ref 2.0–3.0)

## 2020-11-07 NOTE — Patient Instructions (Signed)
Description   Continue taking Warfarin '2mg'$  (1 tablet) daily. Recheck INR in 3 weeks, pt's synthroid was increased on 2/20 from 25 mcg to 50 mcg. Call our office if you have any concerns [phone].

## 2020-11-28 ENCOUNTER — Telehealth: Payer: Self-pay | Admitting: Emergency Medicine

## 2020-11-28 ENCOUNTER — Other Ambulatory Visit: Payer: Self-pay

## 2020-11-28 ENCOUNTER — Ambulatory Visit (INDEPENDENT_AMBULATORY_CARE_PROVIDER_SITE_OTHER): Payer: Medicare Other

## 2020-11-28 ENCOUNTER — Ambulatory Visit (INDEPENDENT_AMBULATORY_CARE_PROVIDER_SITE_OTHER): Payer: Medicare Other | Admitting: *Deleted

## 2020-11-28 DIAGNOSIS — Z5181 Encounter for therapeutic drug level monitoring: Secondary | ICD-10-CM | POA: Diagnosis not present

## 2020-11-28 DIAGNOSIS — I4891 Unspecified atrial fibrillation: Secondary | ICD-10-CM

## 2020-11-28 DIAGNOSIS — I495 Sick sinus syndrome: Secondary | ICD-10-CM | POA: Diagnosis not present

## 2020-11-28 LAB — CUP PACEART REMOTE DEVICE CHECK
Battery Impedance: 1198 Ohm
Battery Remaining Longevity: 51 mo
Battery Voltage: 2.76 V
Brady Statistic AP VP Percent: 23 %
Brady Statistic AP VS Percent: 75 %
Brady Statistic AS VP Percent: 1 %
Brady Statistic AS VS Percent: 1 %
Date Time Interrogation Session: 20220317130719
Implantable Lead Implant Date: 20130103
Implantable Lead Implant Date: 20130103
Implantable Lead Location: 753859
Implantable Lead Location: 753860
Implantable Lead Model: 1944
Implantable Lead Model: 1948
Implantable Pulse Generator Implant Date: 20130103
Lead Channel Impedance Value: 381 Ohm
Lead Channel Impedance Value: 586 Ohm
Lead Channel Pacing Threshold Amplitude: 0.375 V
Lead Channel Pacing Threshold Amplitude: 0.5 V
Lead Channel Pacing Threshold Pulse Width: 0.4 ms
Lead Channel Pacing Threshold Pulse Width: 0.4 ms
Lead Channel Setting Pacing Amplitude: 2 V
Lead Channel Setting Pacing Amplitude: 2.5 V
Lead Channel Setting Pacing Pulse Width: 0.4 ms
Lead Channel Setting Sensing Sensitivity: 5.6 mV

## 2020-11-28 LAB — POCT INR: INR: 2.1 (ref 2.0–3.0)

## 2020-11-28 NOTE — Telephone Encounter (Signed)
Attempted to call patient in reference to a [facility] Alert received on 11/28/2020 for persistent A-fib since 10/28/2020. Patient has a hx of PAF and is on an [place] Coumadin 2 mg once daily. Patient also prescribed amiodarone 1 tablet daily. Patient compliance with medications noted. Spoke with patient. Patient non-symptomatic at this time. Spoke with patient's DPR Darryll Capers) daughter and notified her of the alert. Patient and DPR agreeable for a referral to the [facility]. DPR [name] [phone] would like to be contacted by the [facility] at number listed above.

## 2020-11-28 NOTE — Patient Instructions (Signed)
Description   Continue taking Warfarin '2mg'$  (1 tablet) daily. Recheck INR in 3 weeks with MD appt. (Pt's synthroid was increased on 2/20 from 25 mcg to 50 mcg). Call our office if you have any concerns [phone].

## 2020-12-02 NOTE — Telephone Encounter (Signed)
Per Adline Peals, PA pt in known persistent afib since last visit with Dr Percival Spanish. Pt has upcoming appt with Dr. Caryl Comes will have pt follow up with him as scheduled.

## 2020-12-06 NOTE — Progress Notes (Signed)
Remote pacemaker transmission.   

## 2020-12-12 DIAGNOSIS — I4891 Unspecified atrial fibrillation: Secondary | ICD-10-CM | POA: Insufficient documentation

## 2020-12-16 ENCOUNTER — Encounter: Payer: Self-pay | Admitting: Internal Medicine

## 2020-12-16 ENCOUNTER — Ambulatory Visit (INDEPENDENT_AMBULATORY_CARE_PROVIDER_SITE_OTHER): Payer: Medicare Other | Admitting: Internal Medicine

## 2020-12-16 ENCOUNTER — Ambulatory Visit (INDEPENDENT_AMBULATORY_CARE_PROVIDER_SITE_OTHER): Payer: Medicare Other

## 2020-12-16 ENCOUNTER — Other Ambulatory Visit: Payer: Self-pay

## 2020-12-16 VITALS — BP 140/80 | HR 69 | Ht 59.0 in | Wt 105.2 lb

## 2020-12-16 DIAGNOSIS — I4819 Other persistent atrial fibrillation: Secondary | ICD-10-CM | POA: Diagnosis not present

## 2020-12-16 DIAGNOSIS — Z950 Presence of cardiac pacemaker: Secondary | ICD-10-CM

## 2020-12-16 DIAGNOSIS — Z5181 Encounter for therapeutic drug level monitoring: Secondary | ICD-10-CM | POA: Diagnosis not present

## 2020-12-16 DIAGNOSIS — I4891 Unspecified atrial fibrillation: Secondary | ICD-10-CM | POA: Diagnosis not present

## 2020-12-16 DIAGNOSIS — I495 Sick sinus syndrome: Secondary | ICD-10-CM | POA: Diagnosis not present

## 2020-12-16 DIAGNOSIS — I447 Left bundle-branch block, unspecified: Secondary | ICD-10-CM

## 2020-12-16 LAB — POCT INR: INR: 3.5 — AB (ref 2.0–3.0)

## 2020-12-16 NOTE — Patient Instructions (Signed)
Medication Instructions:  Your physician has recommended you make the following change in your medication:   ** Stop Amiodarone  Stop your evening Furosemide (Lasix)  *If you need a refill on your cardiac medications before your next appointment, please call your pharmacy*   Lab Work: None ordered.  If you have labs (blood work) drawn today and your tests are completely normal, you will receive your results only by: Marland Kitchen MyChart Message (if you have MyChart) OR . A paper copy in the mail If you have any lab test that is abnormal or we need to change your treatment, we will call you to review the results.   Testing/Procedures: None ordered.    Follow-Up: At [facility], you and your health needs are our priority.  As part of our continuing mission to provide you with exceptional heart care, we have created designated Provider Care Teams.  These Care Teams include your primary Cardiologist (physician) and Advanced Practice Providers (APPs -  Physician Assistants and Nurse Practitioners) who all work together to provide you with the care you need, when you need it.  We recommend signing up for the patient portal called "MyChart".  Sign up information is provided on this After Visit Summary.  MyChart is used to connect with patients for Virtual Visits (Telemedicine).  Patients are able to view lab/test results, encounter notes, upcoming appointments, etc.  Non-urgent messages can be sent to your provider as well.   To learn more about what you can do with MyChart, go to [email or web address].    Your next appointment:   12 month(s)  The format for your next appointment:   In Person  Provider:   Virl Axe, MD

## 2020-12-16 NOTE — Progress Notes (Signed)
Patient Care Team: Chriss Czar, MD as PCP - General (Family Medicine) Minus Breeding, MD as PCP - Cardiology (Cardiology) Deboraha Sprang, MD as PCP - Electrophysiology (Cardiology)   HPI  [name] is a 87 y.o. female Seen In followup for pacemaker implanted for tachybradycardia syndrome January 0000000 complicated by atrial lead dislodgment. She had a new lead placed thereafter. She also has atrial fibrillation and is on amiodarone. Anticoagulation with warfarin  Was in atrial flutter whgen seen by [facility] on 2/22  However, neither she nor her daughter are convinced that there has been any change in her breathing over the last 19month since atrial flutter became persistently present since early January apart from a week or so in early February  No bleeding  Denies chest pain  Date Cr K TSH LFTs Hgb  11/17    3.52 26+ 12(4/17)  6/19    5.53 25 8.9 (4/19)  1/21   8.89    2/21   4.23    7/21 1.49 4.7 5.86 16 10.4       Past Medical History:  Diagnosis Date  . Acute on chronic diastolic (congestive) heart failure ([facility] 01/22/2019  . Acute on chronic systolic CHF (congestive heart failure) ([facility] [place] 03/04/2018  . Anemia   . Arthritis    "knees especially"  . Bradycardia   . Cardiomyopathy    presumed ischemic in the past with an EF of 35%. The most recent EF in 03-2008, however, was 55 -60%  . CHF 12/28/2007   Qualifier: Diagnosis of  By: PLaney Potash [name]    . Chronic anticoagulation 12/15/2015  . Chronic renal insufficiency   . Dementia ([place]    mild  . Dyslipidemia   . Encounter for therapeutic drug monitoring 12/25/2013  . Glaucoma   . Hypertension   . Malleolar fracture   . Pacemaker infection ([facility] 08/18/2012  . Pacemaker- Medtronic 12/25/2011   DOI 1/13   . PAF (paroxysmal atrial fibrillation) ([facility] 04/19/2017  . Paroxysmal atrial fibrillation (HCC)   . Sinus node dysfunction ([facility] 12/28/2007   Qualifier: Diagnosis of  By: PLaney Potash [name]     .  Tachycardia-bradycardia syndrome ([facility] 09/19/2011    Past Surgical History:  Procedure Laterality Date  . ABDOMINAL HYSTERECTOMY    . CARDIOVERSION  07/2011  . EYE SURGERY  2002   "blood clot behind eye"  . FRACTURE SURGERY  ~ 2007   RLE  . HEMORRHOID SURGERY    . INSERT / REPLACE / REMOVE PACEMAKER  09/17/11   initial placement  . INSERT / REPLACE / REMOVE PACEMAKER  09/18/11  . PACEMAKER REVISION N/A 09/18/2011   Procedure: PACEMAKER REVISION;  Surgeon: JThompson Grayer MD;  [facility];  Service: Cardiovascular;  Laterality: N/A;  . PERMANENT PACEMAKER INSERTION N/A 09/17/2011   Procedure: PERMANENT PACEMAKER INSERTION;  Surgeon: SDeboraha Sprang MD;  [facility];  Service: Cardiovascular;  Laterality: N/A;  . TONSILLECTOMY AND ADENOIDECTOMY      Current Outpatient Medications  Medication Sig Dispense Refill  . acetaminophen (TYLENOL) 500 MG tablet Take 325 mg by mouth every 6 (six) hours as needed for headache (pain).     .Marland Kitchenamiodarone (PACERONE) 200 MG tablet TAKE 1 TABLET(200 MG) BY MOUTH DAILY 90 tablet 3  . atorvastatin (LIPITOR) 10 MG tablet TAKE 1/2 TABLET(5 MG) BY MOUTH AT BEDTIME 45 tablet 3  . brimonidine (ALPHAGAN) 0.2 % ophthalmic solution Place 1 drop into both eyes daily.    .Marland Kitchen  Famotidine (PEPCID AC MAXIMUM STRENGTH) 20 MG CHEW Chew 1 tablet by mouth at bedtime.     . furosemide (LASIX) 40 MG tablet TAKE 1 TABLET(40 MG) BY MOUTH EVERY EVENING 90 tablet 2  . furosemide (LASIX) 80 MG tablet TAKE 1 TABLET(80 MG) BY MOUTH DAILY 90 tablet 3  . IRON, FERROUS GLUCONATE, PO Take by mouth daily.    Marland Kitchen levothyroxine (SYNTHROID) 50 MCG tablet Take 50 mcg by mouth daily before breakfast.    . Melatonin 3 MG CAPS Take 3 mg by mouth at bedtime.     . mirtazapine (REMERON) 7.5 MG tablet Take 7.5 mg by mouth at bedtime.    . Multiple Vitamin (MULTIVITAMIN WITH MINERALS) TABS tablet Take 1 tablet by mouth daily.    . Olopatadine HCl 0.2 % SOLN Place 1 drop into both eyes daily.      . potassium chloride SA (KLOR-CON) 20 MEQ tablet Take 1 tablet (20 mEq total) by mouth 2 (two) times daily. 180 tablet 3  . senna (SENOKOT) 8.6 MG TABS tablet Take 1 tablet by mouth at bedtime as needed for mild constipation.    . Travoprost, BAK Free, (TRAVATAN) 0.004 % SOLN ophthalmic solution Place 1 drop into both eyes at bedtime.    Marland Kitchen warfarin (COUMADIN) 2 MG tablet TAKE 1 TABLET BY MOUTH DAILY AS DIRECTED BY COUMADIN CLINIC 90 tablet 1   No current facility-administered medications for this visit.    No Known Allergies  Review of Systems negative except from HPI and PMH  Physical Exam BP 140/80   Pulse 69   Ht '4\' 11"'$  (1.499 m)   Wt 105 lb 3.2 oz (47.7 kg)   SpO2 95%   BMI 21.25 kg/m  Well developed and thin AA female in no acute distress HENT normal Neck supple   Clear Device pocket well healed; without hematoma or erythema.  There is no tethering  Regular rate and rhythm, no murmur Abd-soft with active BS No Clubbing cyanosis edema Skin-warm and dry A & Oriented  Grossly normal sensory and motor function  ECG atrial flutter-atypical with 100% V pacing   ECG atrial pacing at 67 Intervals 20/19/48 Left bundle branch block   Assessment and  Plan   Atrial flutter-long-term-persistent    Sinus node dysfunction  LBBB  Pacemaker- Medtronic    Anemia   Elevated TSH  She is in persistent atrial flutter for most of the last 3 months.  In the absence of evidence of worsening functional status i.e. dyspnea, she has never had palpitations, we discussed pace termination of the flutter to restore sinus rhythm versus the discontinuing of the amiodarone.  Given that she has had for spontaneously terminating events in the last 6 months, the likelihood of being able to maintain sinus rhythm long-term on her current dose of amiodarone would not be expected to be good.  Hence, with decreasing effectiveness and decreasing evidence of benefit we have decided to discontinue  the amiodarone.  This being the case we will have to keep an eye on her TSH as her hypothyroidism may have been amiodarone induced.  Her thyroid replacement may need adjusting  Moreover, her warfarin will also likely need adjusting.  We will let the Coumadin clinic know so as to be able to follow her closely with a decreasing interaction  She was ventricularly paced 100% of the time and atrial arrhythmia; when she was in sinus, she atrial paced always and conducted via a left bundle.  The effective contraction  is not likely much different with pacing versus her left bundle; would not expect uncued untoward effect on left ventricular function

## 2020-12-16 NOTE — Patient Instructions (Signed)
-   skip warfarin tonight, then - Continue taking Warfarin '2mg'$  (1 tablet) daily.  - Recheck INR in 3 weeks  (Pt's synthroid was increased on 2/20 from 25 mcg to 50 mcg).  Call our office if you have any concerns [phone].

## 2021-01-06 ENCOUNTER — Other Ambulatory Visit: Payer: Self-pay

## 2021-01-06 ENCOUNTER — Ambulatory Visit (INDEPENDENT_AMBULATORY_CARE_PROVIDER_SITE_OTHER): Payer: Medicare Other

## 2021-01-06 DIAGNOSIS — Z5181 Encounter for therapeutic drug level monitoring: Secondary | ICD-10-CM | POA: Diagnosis not present

## 2021-01-06 DIAGNOSIS — I4891 Unspecified atrial fibrillation: Secondary | ICD-10-CM

## 2021-01-06 LAB — POCT INR: INR: 3 (ref 2.0–3.0)

## 2021-01-06 NOTE — Patient Instructions (Signed)
-   have a serving of greens in the day or 2 - Continue taking Warfarin '2mg'$  (1 tablet) daily.  - Recheck INR in 4 weeks  (Pt's synthroid was increased on 2/20 from 25 mcg to 50 mcg).  Call our office if you have any concerns [phone].

## 2021-01-15 ENCOUNTER — Other Ambulatory Visit: Payer: Self-pay | Admitting: Cardiology

## 2021-01-25 DIAGNOSIS — I4892 Unspecified atrial flutter: Secondary | ICD-10-CM | POA: Insufficient documentation

## 2021-01-25 NOTE — Progress Notes (Signed)
Cardiology Office Note   Date:  01/27/2021   ID:  [name], DOB [birth year], MRN [id number]  PCP:  Chriss Czar, MD  Cardiologist:   Minus Breeding, MD   Chief Complaint  Patient presents with  . Cardiomyopathy       History of Present Illness: [name] is a 87 y.o. female who presents for followup of had tachybradycardia syndrome.  She was in the hospital in April 2021 with acute on chronic systolic and diastolic HF.  Since I last saw her she saw Dr. Caryl Comes. After careful discussion with the patient it was decided that she would stop the amiodarone.    Since she was last seen she has had no new cardiovascular problems.  She gets around with a rolling walker.  She is not having any new palpitations, presyncope or syncope.  Dr. Caryl Comes also reduced her Lasix and she is doing well just with 80 mg in the morning and not the 40 mg in the p.m.  Past Medical History:  Diagnosis Date  . Acute on chronic diastolic (congestive) heart failure ([place]) 01/22/2019  . Acute on chronic systolic CHF (congestive heart failure) ([place]) 03/04/2018  . Anemia   . Arthritis    "knees especially"  . Bradycardia   . Cardiomyopathy    presumed ischemic in the past with an EF of 35%. The most recent EF in 03-2008, however, was 55 -60%  . CHF 12/28/2007   Qualifier: Diagnosis of  By: Laney Potash, [name]    . Chronic anticoagulation 12/15/2015  . Chronic renal insufficiency   . Dementia ([place])    mild  . Dyslipidemia   . Encounter for therapeutic drug monitoring 12/25/2013  . Glaucoma   . Hypertension   . Malleolar fracture   . Pacemaker infection ([place]) 08/18/2012  . Pacemaker- Medtronic 12/25/2011   DOI 1/13   . PAF (paroxysmal atrial fibrillation) ([place]) 04/19/2017  . Paroxysmal atrial fibrillation (HCC)   . Sinus node dysfunction ([place]) 12/28/2007   Qualifier: Diagnosis of  By: Laney Potash, [name]     . Tachycardia-bradycardia syndrome ([place]) 09/19/2011    Past Surgical History:  Procedure  Laterality Date  . ABDOMINAL HYSTERECTOMY    . CARDIOVERSION  07/2011  . EYE SURGERY  2002   "blood clot behind eye"  . FRACTURE SURGERY  ~ 2007   RLE  . HEMORRHOID SURGERY    . INSERT / REPLACE / REMOVE PACEMAKER  09/17/11   initial placement  . INSERT / REPLACE / REMOVE PACEMAKER  09/18/11  . PACEMAKER REVISION N/A 09/18/2011   Procedure: PACEMAKER REVISION;  Surgeon: Thompson Grayer, MD;  Location[facility] LAB;  Service: Cardiovascular;  Laterality: N/A;  . PERMANENT PACEMAKER INSERTION N/A 09/17/2011   Procedure: PERMANENT PACEMAKER INSERTION;  Surgeon: Deboraha Sprang, MD;  Location: [facility] CATH LAB;  Service: Cardiovascular;  Laterality: N/A;  . TONSILLECTOMY AND ADENOIDECTOMY       Current Outpatient Medications  Medication Sig Dispense Refill  . acetaminophen (TYLENOL) 500 MG tablet Take 325 mg by mouth every 6 (six) hours as needed for headache (pain).     Marland Kitchen atorvastatin (LIPITOR) 10 MG tablet TAKE 1/2 TABLET(5 MG) BY MOUTH AT BEDTIME 45 tablet 3  . brimonidine (ALPHAGAN) 0.2 % ophthalmic solution Place 1 drop into both eyes daily.    . Famotidine (PEPCID AC MAXIMUM STRENGTH) 20 MG CHEW Chew 1 tablet by mouth at bedtime.     . furosemide (LASIX) 80 MG tablet TAKE 1 TABLET(80  MG) BY MOUTH DAILY 90 tablet 3  . IRON, FERROUS GLUCONATE, PO Take by mouth daily.    Marland Kitchen levothyroxine (SYNTHROID) 50 MCG tablet Take 50 mcg by mouth daily before breakfast.    . Melatonin 3 MG CAPS Take 3 mg by mouth at bedtime.     . mirtazapine (REMERON) 7.5 MG tablet Take 7.5 mg by mouth at bedtime.    . Multiple Vitamin (MULTIVITAMIN WITH MINERALS) TABS tablet Take 1 tablet by mouth daily.    . Olopatadine HCl 0.2 % SOLN Place 1 drop into both eyes daily.     . potassium chloride SA (KLOR-CON) 20 MEQ tablet Take 1 tablet (20 mEq total) by mouth 2 (two) times daily. 180 tablet 3  . senna (SENOKOT) 8.6 MG TABS tablet Take 1 tablet by mouth at bedtime as needed for mild constipation.    . Travoprost, BAK Free,  (TRAVATAN) 0.004 % SOLN ophthalmic solution Place 1 drop into both eyes at bedtime.    Marland Kitchen warfarin (COUMADIN) 2 MG tablet TAKE 1 TABLET BY MOUTH DAILY AS DIRECTED BY COUMADIN CLINIC 90 tablet 1   No current facility-administered medications for this visit.    Allergies:   Patient has no known allergies.    ROS:  Please see the history of present illness.   Otherwise, review of systems are positive for none.   All other systems are reviewed and negative.    PHYSICAL EXAM: VS:  BP 132/78   Pulse (!) 59   Ht '4\' 11"'$  (1.499 m)   Wt 103 lb (46.7 kg)   SpO2 95%   BMI 20.80 kg/m  , BMI Body mass index is 20.8 kg/m. GEN:  No distress NECK:  No jugular venous distention at 90 degrees, waveform within normal limits, carotid upstroke brisk and symmetric, no bruits, no thyromegaly LYMPHATICS:  No cervical adenopathy LUNGS:  Clear to auscultation bilaterally BACK:  No CVA tenderness CHEST:  Unremarkable HEART:  S1 and S2 within normal limits, no S3, no S4, no clicks, no rubs, 3 out of 6 apical systolic murmur holosystolic and heard at the apex, no diastolic murmurs ABD:  Positive bowel sounds normal in frequency in pitch, no bruits, no rebound, no guarding, unable to assess midline mass or bruit with the patient seated. EXT:  2 plus pulses throughout, moderate edema, no cyanosis no clubbing   EKG:  EKG is  not ordered today.    Recent Labs: Lab Results  Component Value Date   TSH 4.230 10/20/2019   ALT 30 09/29/2019   AST 34 09/29/2019   ALKPHOS 70 09/29/2019   BILITOT 0.8 09/29/2019   PROT 6.6 09/29/2019   ALBUMIN 4.4 09/29/2019    Lipid Panel    Component Value Date/Time   CHOL 135 10/31/2015 1202   TRIG 55 10/31/2015 1202   HDL 96 10/31/2015 1202   CHOLHDL 1.4 10/31/2015 1202   VLDL 11 10/31/2015 1202   LDLCALC 28 10/31/2015 1202      Wt Readings from Last 3 Encounters:  01/27/21 103 lb (46.7 kg)  12/16/20 105 lb 3.2 oz (47.7 kg)  10/28/20 107 lb (48.5 kg)       Other studies Reviewed: Additional studies/ records that were reviewed today include:  EP notes Review of the above records demonstrates: See elsewhere   ASSESSMENT AND PLAN:  HTN: Blood pressures controlled.  No change in therapy.  Chronic systolic and diastolic heart failure:    She seems to be euvolemic with a lower dose  of Lasix and we talked about as needed dosing of the 40 mg p.m.   Tachybrady syndrome:    She is now off the amiodarone.  No change in therapy.  She tolerates anticoagulation.   Current medicines are reviewed at length with the patient today.  No change  The following changes have been made:   None  Labs/ tests ordered today include: None  No orders of the defined types were placed in this encounter.    Disposition:   FU with me in 3-4 months.     Signed, Minus Breeding, MD  01/27/2021 12:30 PM    [facility]

## 2021-01-27 ENCOUNTER — Encounter: Payer: Self-pay | Admitting: Cardiology

## 2021-01-27 ENCOUNTER — Ambulatory Visit (INDEPENDENT_AMBULATORY_CARE_PROVIDER_SITE_OTHER): Payer: Medicare Other | Admitting: Cardiology

## 2021-01-27 ENCOUNTER — Other Ambulatory Visit: Payer: Self-pay

## 2021-01-27 ENCOUNTER — Ambulatory Visit (INDEPENDENT_AMBULATORY_CARE_PROVIDER_SITE_OTHER): Payer: Medicare Other

## 2021-01-27 VITALS — BP 132/78 | HR 59 | Ht 59.0 in | Wt 103.0 lb

## 2021-01-27 DIAGNOSIS — I4892 Unspecified atrial flutter: Secondary | ICD-10-CM

## 2021-01-27 DIAGNOSIS — I5032 Chronic diastolic (congestive) heart failure: Secondary | ICD-10-CM | POA: Diagnosis not present

## 2021-01-27 DIAGNOSIS — I495 Sick sinus syndrome: Secondary | ICD-10-CM

## 2021-01-27 DIAGNOSIS — Z5181 Encounter for therapeutic drug level monitoring: Secondary | ICD-10-CM | POA: Diagnosis not present

## 2021-01-27 DIAGNOSIS — I4891 Unspecified atrial fibrillation: Secondary | ICD-10-CM | POA: Diagnosis not present

## 2021-01-27 LAB — POCT INR: INR: 1.9 — AB (ref 2.0–3.0)

## 2021-01-27 NOTE — Patient Instructions (Signed)
Medication Instructions:  Your physician recommends that you continue on your current medications as directed. Please refer to the Current Medication list given to you today.   *If you need a refill on your cardiac medications before your next appointment, please call your pharmacy*  Lab Work: NONE   Testing/Procedures: NONE  Follow-Up: At [facility], you and your health needs are our priority.  As part of our continuing mission to provide you with exceptional heart care, we have created designated Provider Care Teams.  These Care Teams include your primary Cardiologist (physician) and Advanced Practice Providers (APPs -  Physician Assistants and Nurse Practitioners) who all work together to provide you with the care you need, when you need it.  We recommend signing up for the patient portal called "MyChart".  Sign up information is provided on this After Visit Summary.  MyChart is used to connect with patients for Virtual Visits (Telemedicine).  Patients are able to view lab/test results, encounter notes, upcoming appointments, etc.  Non-urgent messages can be sent to your provider as well.   To learn more about what you can do with MyChart, go to [email or web address].    Your next appointment:   3-4 month(s)  The format for your next appointment:   In Person  Provider:   You may see Minus Breeding, MD or one of the following Advanced Practice Providers on your designated Care Team:    Rosaria Ferries, PA-C  Jory Sims, DNP, ANP

## 2021-01-27 NOTE — Patient Instructions (Signed)
-   Take 2 tablets tonight only @ 4:30 - Continue taking Warfarin '2mg'$  (1 tablet) daily.  - Recheck INR in 4 weeks  (Pt's synthroid was increased on 2/20 from 25 mcg to 50 mcg).  Call our office if you have any concerns [phone].

## 2021-02-16 ENCOUNTER — Other Ambulatory Visit: Payer: Self-pay | Admitting: Cardiology

## 2021-02-17 ENCOUNTER — Telehealth: Payer: Self-pay | Admitting: Cardiology

## 2021-02-17 ENCOUNTER — Inpatient Hospital Stay (HOSPITAL_COMMUNITY)
Admission: EM | Admit: 2021-02-17 | Discharge: 2021-02-20 | DRG: 291 | Disposition: A | Discharge status: Home Health | Payer: Medicare Other | Attending: Family Medicine | Admitting: Family Medicine

## 2021-02-17 ENCOUNTER — Observation Stay (HOSPITAL_COMMUNITY): Payer: Medicare Other

## 2021-02-17 ENCOUNTER — Encounter (HOSPITAL_COMMUNITY): Payer: Self-pay

## 2021-02-17 ENCOUNTER — Emergency Department (HOSPITAL_COMMUNITY): Payer: Medicare Other

## 2021-02-17 DIAGNOSIS — I483 Typical atrial flutter: Secondary | ICD-10-CM

## 2021-02-17 DIAGNOSIS — Z950 Presence of cardiac pacemaker: Secondary | ICD-10-CM

## 2021-02-17 DIAGNOSIS — I5042 Chronic combined systolic (congestive) and diastolic (congestive) heart failure: Secondary | ICD-10-CM

## 2021-02-17 DIAGNOSIS — I509 Heart failure, unspecified: Secondary | ICD-10-CM | POA: Diagnosis not present

## 2021-02-17 DIAGNOSIS — M109 Gout, unspecified: Secondary | ICD-10-CM

## 2021-02-17 DIAGNOSIS — Z79899 Other long term (current) drug therapy: Secondary | ICD-10-CM

## 2021-02-17 DIAGNOSIS — I447 Left bundle-branch block, unspecified: Secondary | ICD-10-CM | POA: Diagnosis present

## 2021-02-17 DIAGNOSIS — L03115 Cellulitis of right lower limb: Secondary | ICD-10-CM | POA: Diagnosis present

## 2021-02-17 DIAGNOSIS — I495 Sick sinus syndrome: Secondary | ICD-10-CM | POA: Diagnosis present

## 2021-02-17 DIAGNOSIS — I480 Paroxysmal atrial fibrillation: Secondary | ICD-10-CM | POA: Diagnosis present

## 2021-02-17 DIAGNOSIS — I4892 Unspecified atrial flutter: Secondary | ICD-10-CM | POA: Diagnosis present

## 2021-02-17 DIAGNOSIS — E039 Hypothyroidism, unspecified: Secondary | ICD-10-CM | POA: Diagnosis not present

## 2021-02-17 DIAGNOSIS — H409 Unspecified glaucoma: Secondary | ICD-10-CM | POA: Diagnosis present

## 2021-02-17 DIAGNOSIS — I5043 Acute on chronic combined systolic (congestive) and diastolic (congestive) heart failure: Secondary | ICD-10-CM | POA: Diagnosis not present

## 2021-02-17 DIAGNOSIS — F0390 Unspecified dementia without behavioral disturbance: Secondary | ICD-10-CM | POA: Diagnosis present

## 2021-02-17 DIAGNOSIS — R0609 Other forms of dyspnea: Secondary | ICD-10-CM

## 2021-02-17 DIAGNOSIS — I130 Hypertensive heart and chronic kidney disease with heart failure and stage 1 through stage 4 chronic kidney disease, or unspecified chronic kidney disease: Principal | ICD-10-CM | POA: Diagnosis present

## 2021-02-17 DIAGNOSIS — Z7901 Long term (current) use of anticoagulants: Secondary | ICD-10-CM

## 2021-02-17 DIAGNOSIS — E785 Hyperlipidemia, unspecified: Secondary | ICD-10-CM | POA: Diagnosis present

## 2021-02-17 DIAGNOSIS — R748 Abnormal levels of other serum enzymes: Secondary | ICD-10-CM | POA: Diagnosis present

## 2021-02-17 DIAGNOSIS — I10 Essential (primary) hypertension: Secondary | ICD-10-CM | POA: Diagnosis not present

## 2021-02-17 DIAGNOSIS — M21611 Bunion of right foot: Secondary | ICD-10-CM | POA: Diagnosis present

## 2021-02-17 DIAGNOSIS — N1832 Chronic kidney disease, stage 3b: Secondary | ICD-10-CM | POA: Diagnosis present

## 2021-02-17 DIAGNOSIS — I255 Ischemic cardiomyopathy: Secondary | ICD-10-CM | POA: Diagnosis present

## 2021-02-17 DIAGNOSIS — Z79890 Hormone replacement therapy (postmenopausal): Secondary | ICD-10-CM

## 2021-02-17 DIAGNOSIS — Z20822 Contact with and (suspected) exposure to covid-19: Secondary | ICD-10-CM | POA: Diagnosis present

## 2021-02-17 DIAGNOSIS — M2012 Hallux valgus (acquired), left foot: Secondary | ICD-10-CM | POA: Diagnosis present

## 2021-02-17 DIAGNOSIS — I5033 Acute on chronic diastolic (congestive) heart failure: Secondary | ICD-10-CM | POA: Diagnosis present

## 2021-02-17 DIAGNOSIS — M21612 Bunion of left foot: Secondary | ICD-10-CM | POA: Diagnosis present

## 2021-02-17 DIAGNOSIS — D539 Nutritional anemia, unspecified: Secondary | ICD-10-CM | POA: Diagnosis present

## 2021-02-17 LAB — ECHOCARDIOGRAM COMPLETE
AR max vel: 2.37 cm2
AV Area VTI: 2.44 cm2
AV Area mean vel: 2.17 cm2
AV Mean grad: 1.8 mmHg
AV Peak grad: 3 mmHg
Ao pk vel: 0.87 m/s
MV M vel: 5.42 m/s
MV Peak grad: 117.5 mmHg
P 1/2 time: 669 msec
Radius: 0.6 cm
S' Lateral: 4.5 cm

## 2021-02-17 LAB — BASIC METABOLIC PANEL
Anion gap: 12 (ref 5–15)
BUN: 37 mg/dL — ABNORMAL HIGH (ref 8–23)
CO2: 27 mmol/L (ref 22–32)
Calcium: 9.4 mg/dL (ref 8.9–10.3)
Chloride: 102 mmol/L (ref 98–111)
Creatinine, Ser: 1.45 mg/dL — ABNORMAL HIGH (ref 0.44–1.00)
GFR, Estimated: 33 mL/min — ABNORMAL LOW (ref >60)
Glucose, Bld: 125 mg/dL — ABNORMAL HIGH (ref 70–99)
Potassium: 3.6 mmol/L (ref 3.5–5.1)
Sodium: 141 mmol/L (ref 135–145)

## 2021-02-17 LAB — CBC
HCT: 37.7 % (ref 36.0–46.0)
Hemoglobin: 11.6 g/dL — ABNORMAL LOW (ref 12.0–15.0)
MCH: 32.1 pg (ref 26.0–34.0)
MCHC: 30.8 g/dL (ref 30.0–36.0)
MCV: 104.4 fL — ABNORMAL HIGH (ref 80.0–100.0)
Platelets: 156 10*3/uL (ref 150–400)
RBC: 3.61 MIL/uL — ABNORMAL LOW (ref 3.87–5.11)
RDW: 13.6 % (ref 11.5–15.5)
WBC: 9.7 10*3/uL (ref 4.0–10.5)
nRBC: 0 % (ref 0.0–0.2)

## 2021-02-17 LAB — TROPONIN I (HIGH SENSITIVITY)
Troponin I (High Sensitivity): 13 ng/L (ref <18)
Troponin I (High Sensitivity): 12 ng/L (ref <18)

## 2021-02-17 LAB — PROTIME-INR
INR: 2 — ABNORMAL HIGH (ref 0.8–1.2)
Prothrombin Time: 22.7 seconds — ABNORMAL HIGH (ref 11.4–15.2)

## 2021-02-17 LAB — T4, FREE: Free T4: 1.92 ng/dL — ABNORMAL HIGH (ref 0.61–1.12)

## 2021-02-17 LAB — HEPATIC FUNCTION PANEL
ALT: 67 U/L — ABNORMAL HIGH (ref 0–44)
AST: 44 U/L — ABNORMAL HIGH (ref 15–41)
Albumin: 4 g/dL (ref 3.5–5.0)
Alkaline Phosphatase: 73 U/L (ref 38–126)
Bilirubin, Direct: 0.3 mg/dL — ABNORMAL HIGH (ref 0.0–0.2)
Indirect Bilirubin: 1.4 mg/dL — ABNORMAL HIGH (ref 0.3–0.9)
Total Bilirubin: 1.7 mg/dL — ABNORMAL HIGH (ref 0.3–1.2)
Total Protein: 6.8 g/dL (ref 6.5–8.1)

## 2021-02-17 LAB — BRAIN NATRIURETIC PEPTIDE: B Natriuretic Peptide: 1897.8 pg/mL — ABNORMAL HIGH (ref 0.0–100.0)

## 2021-02-17 MED ORDER — OLOPATADINE HCL 0.1 % OP SOLN
1.0000 [drp] | Freq: Two times a day (BID) | OPHTHALMIC | Status: DC
  Administered 2021-02-17 – 2021-02-20 (×6): 1 [drp] via OPHTHALMIC
  Filled 2021-02-17: qty 5

## 2021-02-17 MED ORDER — IRON (FERROUS GLUCONATE) 256 (28 FE) MG PO TABS: ORAL_TABLET | Freq: Every day | ORAL | Status: DC

## 2021-02-17 MED ORDER — METOPROLOL TARTRATE 25 MG PO TABS
25.0000 mg | ORAL_TABLET | Freq: Three times a day (TID) | ORAL | Status: DC
  Administered 2021-02-17 – 2021-02-18 (×2): 25 mg via ORAL
  Filled 2021-02-17 (×2): qty 1

## 2021-02-17 MED ORDER — WARFARIN - PHARMACIST DOSING INPATIENT: Freq: Every day | Status: DC

## 2021-02-17 MED ORDER — LEVOTHYROXINE SODIUM 50 MCG PO TABS
50.0000 ug | ORAL_TABLET | Freq: Every day | ORAL | Status: DC
  Administered 2021-02-18 – 2021-02-20 (×3): 50 ug via ORAL
  Filled 2021-02-17 (×3): qty 1

## 2021-02-17 MED ORDER — ACETAMINOPHEN 325 MG PO TABS
650.0000 mg | ORAL_TABLET | ORAL | Status: DC | PRN
  Filled 2021-02-17: qty 2

## 2021-02-17 MED ORDER — POTASSIUM CHLORIDE CRYS ER 20 MEQ PO TBCR
20.0000 meq | EXTENDED_RELEASE_TABLET | Freq: Once | ORAL | Status: AC
  Administered 2021-02-17: 20 meq via ORAL
  Filled 2021-02-17: qty 1

## 2021-02-17 MED ORDER — WARFARIN - PHYSICIAN DOSING INPATIENT: Freq: Every day | Status: DC

## 2021-02-17 MED ORDER — METOPROLOL TARTRATE 25 MG PO TABS: 25.0000 mg | ORAL_TABLET | Freq: Two times a day (BID) | ORAL | Status: DC

## 2021-02-17 MED ORDER — LATANOPROST 0.005 % OP SOLN
1.0000 [drp] | Freq: Every day | OPHTHALMIC | Status: DC
  Administered 2021-02-17 – 2021-02-19 (×3): 1 [drp] via OPHTHALMIC
  Filled 2021-02-17: qty 2.5

## 2021-02-17 MED ORDER — CEPHALEXIN 250 MG PO CAPS
250.0000 mg | ORAL_CAPSULE | Freq: Two times a day (BID) | ORAL | Status: DC
  Administered 2021-02-17 – 2021-02-20 (×6): 250 mg via ORAL
  Filled 2021-02-17 (×7): qty 1

## 2021-02-17 MED ORDER — PERFLUTREN LIPID MICROSPHERE
1.0000 mL | INTRAVENOUS | Status: AC | PRN
  Administered 2021-02-17: 3 mL via INTRAVENOUS
  Filled 2021-02-17: qty 10

## 2021-02-17 MED ORDER — MELATONIN 3 MG PO TABS: 3.0000 mg | ORAL_TABLET | Freq: Every day | ORAL | Status: DC

## 2021-02-17 MED ORDER — FERROUS GLUCONATE 324 (38 FE) MG PO TABS
324.0000 mg | ORAL_TABLET | Freq: Every day | ORAL | Status: DC
  Administered 2021-02-17 – 2021-02-20 (×4): 324 mg via ORAL
  Filled 2021-02-17 (×5): qty 1

## 2021-02-17 MED ORDER — FUROSEMIDE 10 MG/ML IJ SOLN
80.0000 mg | Freq: Once | INTRAMUSCULAR | Status: AC
  Administered 2021-02-17: 80 mg via INTRAVENOUS
  Filled 2021-02-17: qty 8

## 2021-02-17 MED ORDER — SODIUM CHLORIDE 0.9% FLUSH: 3.0000 mL | INTRAVENOUS | Status: DC | PRN

## 2021-02-17 MED ORDER — PANTOPRAZOLE SODIUM 40 MG PO TBEC
40.0000 mg | DELAYED_RELEASE_TABLET | Freq: Every day | ORAL | Status: DC
  Administered 2021-02-17 – 2021-02-19 (×3): 40 mg via ORAL
  Filled 2021-02-17 (×3): qty 1

## 2021-02-17 MED ORDER — SODIUM CHLORIDE 0.9% FLUSH
3.0000 mL | Freq: Two times a day (BID) | INTRAVENOUS | Status: DC
  Administered 2021-02-17 – 2021-02-20 (×6): 3 mL via INTRAVENOUS

## 2021-02-17 MED ORDER — PEPCID AC MAXIMUM STRENGTH 20 MG PO CHEW: 1.0000 | CHEWABLE_TABLET | Freq: Every day | ORAL | Status: DC

## 2021-02-17 MED ORDER — BRIMONIDINE TARTRATE 0.2 % OP SOLN
1.0000 [drp] | Freq: Every day | OPHTHALMIC | Status: DC
  Administered 2021-02-17 – 2021-02-20 (×4): 1 [drp] via OPHTHALMIC
  Filled 2021-02-17: qty 5

## 2021-02-17 MED ORDER — SODIUM CHLORIDE 0.9 % IV SOLN: 250.0000 mL | INTRAVENOUS | Status: DC | PRN

## 2021-02-17 MED ORDER — SENNA 8.6 MG PO TABS: 1.0000 | ORAL_TABLET | Freq: Every evening | ORAL | Status: DC | PRN

## 2021-02-17 MED ORDER — ATORVASTATIN CALCIUM 10 MG PO TABS
5.0000 mg | ORAL_TABLET | Freq: Every day | ORAL | Status: DC
  Administered 2021-02-17 – 2021-02-19 (×3): 5 mg via ORAL
  Filled 2021-02-17 (×3): qty 1

## 2021-02-17 MED ORDER — MIRTAZAPINE 7.5 MG PO TABS
7.5000 mg | ORAL_TABLET | Freq: Every day | ORAL | Status: DC
  Administered 2021-02-17 – 2021-02-19 (×3): 7.5 mg via ORAL
  Filled 2021-02-17 (×4): qty 1

## 2021-02-17 MED ORDER — WARFARIN SODIUM 2 MG PO TABS
2.0000 mg | ORAL_TABLET | Freq: Every day | ORAL | Status: DC
  Administered 2021-02-17 – 2021-02-19 (×3): 2 mg via ORAL
  Filled 2021-02-17 (×3): qty 1

## 2021-02-17 NOTE — H&P (Addendum)
[facility] Admission History and Physical Service Pager: [phone]  Patient name: [name] Medical record number: [id number] Date of birth: [birth year] Age: 87 y.o. Gender: female  Primary Care Provider: Chriss Czar, MD Consultants: Cardiology Code Status: FULL    Preferred Emergency Contact:  Contact Information    Name Relation Home Work Mobile   Murphy,[name] Daughter [phone]  [phone]     Chief Complaint: shortness of breath  Assessment and Plan: [name] is a 87 y.o. female presenting with increasing shortness of breath and orthopnea . PMH is significant for CHF, atrial fibrillation/tachybradycarida syndrome, HTN, glaucoma, hypothyroidism, dyslipidemia.  Acute on chronic systolic and diastolic heart failure Patient presents to ED with shortness of breath and orthopnea. Patient is followed by cardiology outpatient. Previously on '80mg'$  Lasix in the morning and '40mg'$  Lasix in the evening. Dose was recently decreased and patient was taking 80 mg Lasix in the morning and 20 mg in the afternoon. In the last week has had worsening exertional dyspnea and orthopnea, seen by cardiology and increased to '120mg'$  for the last 3 days without improvement. Patient did not take medications this morning, received '80mg'$  IV Lasix in the ED. BNP elevated to 1897.8, troponins normal and flat at 13>12. CXR with b/l perihilar and basilar opacities concerning for pulmonary edema and associated right pleural effusion. Patient dry weight (as or 5/16 measurement) is 103lb. Last echocardiogram 01/21/19 with EF 20-25%. Exacerbation possibly related to heart rate versus medication cause given decreased dose of Lasix. - Admit to [place], attending Dr. Ardelia Mems - Cardiology consulted, appreciate recommendations - Cardiac telemetry - Vitals per routine - Daily weights - Strict Is&Os - Monitor output and response to Lasix - If requiring oxygen or worsening symptoms, then  consider giving further Lasix overnight - Follow-up echocardiogram - Out of bed with assistance - PT/OT eval and treat  Atrial fibrillation  Tachybradycardia syndrome Patient previously on amiodarone, which was discontinued within the last month per cardiology as the dose she was on would not be of benefit. Rate fluctuates between tachycardia and paced rhythm. Troponin normal and flat at 13>12. Pacemaker in place. - Cardiac telemetry - cardiology consulted, appreciate recommendations - Coumadin per pharmacy   Concern for right foot cellulitis vs gout Patient with erythematous and warm right bunion and midfoot (picture in chart) of unknown duration.  Previously extremely tender to palpation.  Treated at home with ice pack.  Reassuringly afebrile with WBC within normal limits. Unsure of gout vs cellulitis, patient has no history of gout though the location would be a common place for gout; there is some spreading into the midfoot, which would be more indicative of cellulitis. Will initiate antibiotics and obtain uric acid level in the morning. - Will initiate Keflex '250mg'$  BID (renally dosed) - Uric acid level in the AM - Monitor fever curve  CKD stage IIIb, stable Creatinine on admission 1.45, baseline appears to be around 1.5-1.7.  GFR 33.  Kidney function stable and given IV Lasix in ED. - Continue to monitor with BMP  HTN, stable BP on admission 123/80. Not currently on any home medications.  -Vitals per routine  Elevated liver enzymes Liver enzymes mildly elevated with AST/ALT 44/67, total bilirubin 1.7. No abdominal pain at this time, will consider repeating LFTs on 6/8. - Monitor for abdominal pain and symptoms  Macrocytic anemia Hgb 11.6, MCV 104.4. Patient medications include ferrous gluconate daily. Consider checking B12 and folate. - Continue home rerrous gluconate daily - Follow-up anemia panel in  the AM  Hypothyroidism Last TSH 4.230 on 10/20/19. 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 Home medications include Levothyroxine 50 mcg - Continue home levothyroxine - TSH check in the AM  Glaucoma Home medications include: Olopatadine 01 2% solution 1 drop both eyes daily, travoprost 1 drop both eyes at bedtime - Continue home eyedrops  Dyslipidemia - Continue home medication atorvastatin '5mg'$  daily  Poor sleep Patient home medications include melatonin and mirtazapine. - Continue home mirtazapine QHS  FEN/GI: Heart healthy Prophylaxis: on Warfarin for anticoagulation  Disposition: Observation, anticipate home for discharge  History of Present Illness:  [name] is a 87 y.o. female presenting with issues breathing for 2 days. Trouble with taking a deep breath. Denies chest pain or chest tightness. Was previously taking 80 mg in the morning and 20 mg of lasix in the afternoon. Was increased to 80 mg morning and 40 mg in the afternoon by PCP. Lives alone but daughter will occasionally come to help as well as aides. Use walker as assistive device. Help with ADLs. Has home oxygen that she used today and made her feel better. Was taken off of amiodarone but daughter gave it to her this morning due to panic.   Review Of Systems: Per HPI with the following additions:   Review of Systems  Constitutional: Negative for fever.  HENT: Positive for sore throat. Negative for congestion, rhinorrhea and sinus pressure.   Eyes: Negative for visual disturbance.  Respiratory: Positive for shortness of breath. Negative for cough and chest tightness.   Cardiovascular: Negative for chest pain, palpitations and leg swelling.  Gastrointestinal: Negative for abdominal pain, constipation and diarrhea.  Genitourinary: Positive for difficulty urinating and frequency. Negative for dysuria.     Patient Active Problem List   Diagnosis Date Noted  . CHF exacerbation ([place]) 02/17/2021  . Acute exacerbation of CHF (congestive heart  failure) ([place]) 02/17/2021  . Atrial flutter ([place]) 01/25/2021  . A-fib ([place]) 12/12/2020  . CHF (congestive heart failure) ([place]) 12/29/2019  . Acute on chronic combined systolic and diastolic congestive heart failure ([place]) 12/28/2019  . Hypothyroidism   . Acute kidney injury superimposed on CKD ([place])   . LBBB (left bundle branch block) 12/06/2019  . Educated about COVID-19 virus infection 09/28/2019  . Acute on chronic diastolic (congestive) heart failure ([place]) 01/22/2019  . Acute on chronic systolic CHF (congestive heart failure) ([place]) 03/04/2018  . PAF (paroxysmal atrial fibrillation) ([place]) 04/19/2017  . Chronic anticoagulation 12/15/2015  . Encounter for therapeutic drug monitoring 12/25/2013  . Pacemaker infection ([place]) 08/18/2012  . Pacemaker- Medtronic 12/25/2011  . Tachycardia-bradycardia syndrome ([place]) 09/19/2011  . Chronic renal insufficiency, stage III (moderate) ([place]) 03/12/2010  . Dyslipidemia 12/28/2007  . ANEMIA 12/28/2007  . Dementia without behavioral disturbance ([place]) 12/28/2007  . Glaucoma 12/28/2007  . Hypertension 12/28/2007  . Cardiomyopathy EF 20% by echo [facility]) 12/28/2007  . Atrial fibrillation with RVR ([place]) 12/28/2007  . Sinus node dysfunction ([place]) 12/28/2007  . CHF 12/28/2007    Past Medical History: Past Medical History:  Diagnosis Date  . Acute on chronic diastolic (congestive) heart failure ([place]) 01/22/2019  . Acute on chronic systolic CHF (congestive heart failure) ([place]) 03/04/2018  . Anemia   . Arthritis    "knees especially"  . Bradycardia   . Cardiomyopathy    presumed ischemic in the past with an EF of 35%. The most recent EF in 03-2008, however, was 55 -60%  . CHF 12/28/2007  Qualifier: Diagnosis of  By: Laney Potash, [name]    . Chronic anticoagulation 12/15/2015  . Chronic renal insufficiency   . Dementia ([place])    mild  . Dyslipidemia   . Encounter for therapeutic drug monitoring 12/25/2013  . Glaucoma   . Hypertension   . Malleolar  fracture   . Pacemaker infection ([place]) 08/18/2012  . Pacemaker- Medtronic 12/25/2011   DOI 1/13   . PAF (paroxysmal atrial fibrillation) ([place]) 04/19/2017  . Paroxysmal atrial fibrillation ([facility])   . Sinus node dysfunction ([place]) 12/28/2007   Qualifier: Diagnosis of  By: Laney Potash, [name]     . Tachycardia-bradycardia syndrome ([place]) 09/19/2011    Past Surgical History: Past Surgical History:  Procedure Laterality Date  . ABDOMINAL HYSTERECTOMY    . CARDIOVERSION  07/2011  . EYE SURGERY  2002   "blood clot behind eye"  . FRACTURE SURGERY  ~ 2007   RLE  . HEMORRHOID SURGERY    . INSERT / REPLACE / REMOVE PACEMAKER  09/17/11   initial placement  . INSERT / REPLACE / REMOVE PACEMAKER  09/18/11  . PACEMAKER REVISION N/A 09/18/2011   Procedure: PACEMAKER REVISION;  Surgeon: Thompson Grayer, MD;  Location[facility] CATH LAB;  Service: Cardiovascular;  Laterality: N/A;  . PERMANENT PACEMAKER INSERTION N/A 09/17/2011   Procedure: PERMANENT PACEMAKER INSERTION;  Surgeon: Deboraha Sprang, MD;  Location: [facility] CATH LAB;  Service: Cardiovascular;  Laterality: N/A;  . TONSILLECTOMY AND ADENOIDECTOMY      Social History: Social History   Tobacco Use  . Smoking status: Never Smoker  . Smokeless tobacco: Never Used  Vaping Use  . Vaping Use: Never used  Substance Use Topics  . Alcohol use: No  . Drug use: No   Additional social history: Lives alone, has home health aides and family that visit to assist  Please also refer to relevant sections of EMR.  Family History: Family History  Problem Relation Age of Onset  . Other Other        Noncontributory    Allergies and Medications: No Known Allergies No current facility-administered medications on file prior to encounter.   Current Outpatient Medications on File Prior to Encounter  Medication Sig Dispense Refill  . acetaminophen (TYLENOL) 500 MG tablet Take 325 mg by mouth every 6 (six) hours as needed for headache (pain).     Marland Kitchen atorvastatin (LIPITOR) 10 MG  tablet TAKE 1/2 TABLET(5 MG) BY MOUTH AT BEDTIME (Patient taking differently: Take 5 mg by mouth at bedtime.) 45 tablet 3  . brimonidine (ALPHAGAN) 0.2 % ophthalmic solution Place 1 drop into both eyes daily.    . Famotidine (PEPCID AC MAXIMUM STRENGTH) 20 MG CHEW Chew 1 tablet by mouth at bedtime.     . IRON, FERROUS GLUCONATE, PO Take 1 tablet by mouth daily.    Marland Kitchen levothyroxine (SYNTHROID) 50 MCG tablet Take 50 mcg by mouth daily before breakfast.    . Melatonin 3 MG CAPS Take 3 mg by mouth at bedtime.     . mirtazapine (REMERON) 7.5 MG tablet Take 7.5 mg by mouth at bedtime.    . Multiple Vitamin (MULTIVITAMIN WITH MINERALS) TABS tablet Take 1 tablet by mouth daily.    . Olopatadine HCl 0.2 % SOLN Place 1 drop into both eyes daily.     . potassium chloride SA (KLOR-CON) 20 MEQ tablet Take 1 tablet (20 mEq total) by mouth 2 (two) times daily. 180 tablet 3  . senna (SENOKOT) 8.6 MG TABS tablet Take  1 tablet by mouth at bedtime as needed for mild constipation.    . Travoprost, BAK Free, (TRAVATAN) 0.004 % SOLN ophthalmic solution Place 1 drop into both eyes at bedtime.    Marland Kitchen warfarin (COUMADIN) 2 MG tablet TAKE 1 TABLET BY MOUTH DAILY AS DIRECTED BY COUMADIN CLINIC (Patient taking differently: Take 2 mg by mouth See admin instructions. TAKE 1 TABLET BY MOUTH DAILY AS DIRECTED BY COUMADIN CLINIC) 90 tablet 1  . furosemide (LASIX) 80 MG tablet TAKE 1 TABLET(80 MG) BY MOUTH DAILY 90 tablet 3    Objective: BP 123/80   Pulse (!) 51   Temp 97.7 F (36.5 C) (Oral)   Resp (!) 26   SpO2 98%  Exam: General -- oriented x3, pleasant and cooperative, thin and elderly female  HEENT -- Head is normocephalic. PERRLA. EOMI. Ears, nose and throat were benign. Neck -- supple; no bruits. Integument -- intact, warm and dry. See below for further description Chest -- bibasilar crackles, rhonchi on exam diffusely, loud upper respiratory sounds Cardiac -- irregular rhythm, intermittently tachycardic, systolic  murmur noted Abdomen -- soft, nontender. No masses palpable. Bowel sounds present. Genital, rectal and breast exam -- deferred. CNS -- cranial nerves II through XII grossly intact.  Extremities - erythematous right midfoot and bunion (pictured below, though erythema more impressive in-person), non-tender to palpation. ROM good. 5/5 bilateral strength. Dorsalis pedis pulses present and symmetrical.         Labs and Imaging: CBC BMET  Recent Labs  Lab 02/17/21 0937  WBC 9.7  HGB 11.6*  HCT 37.7  PLT 156   Recent Labs  Lab 02/17/21 0937  NA 141  K 3.6  CL 102  CO2 27  BUN 37*  CREATININE 1.45*  GLUCOSE 125*  CALCIUM 9.4     EKG: My own interpretation (not copied from electronic read) DG Chest 2 View  Result Date: 02/17/2021 CLINICAL DATA:  Shortness of breath. EXAM: CHEST - 2 VIEW COMPARISON:  December 29, 2019. FINDINGS: Stable cardiomegaly with bilateral perihilar and basilar opacities concerning for pulmonary edema. Stable right pleural effusion noted. No pneumothorax is noted. Left-sided pacemaker is unchanged in position. IMPRESSION: Stable cardiomegaly with bilateral perihilar and basilar opacities concerning for pulmonary edema and associated right pleural effusion. Aortic Atherosclerosis (ICD10-I70.0). Electronically Signed   By: Marijo Conception M.D.   On: 02/17/2021 10:29       [name], DO 02/17/2021, 3:19 PM PGY-1, [facility] Intern pager: [phone], text pages welcome  FPTS Upper-Level Resident Addendum   I have independently interviewed and examined the patient. I have discussed the above with the original author and agree with their documentation. My edits for correction/addition/clarification are in [place]. Please see also any attending notes.   Gerlene Fee, DO PGY-2, [facility] 02/17/2021 4:43 PM  FPTS Service pager: [phone] (text pages welcome through [place])

## 2021-02-17 NOTE — ED Provider Notes (Signed)
[name] Provider Note   CSN: [id number] Arrival date & time: 02/17/21  0940     History Chief Complaint  Patient presents with  . Shortness of Breath    [name] is a 87 y.o. female.  Patient is a 87 year old female with a history of CHF with last EF of 20 to 25%, cardiomyopathy, atrial fibrillation, status post pacemaker and chronic renal insufficiency who is presenting today due to complaint of shortness of breath.  Reports that shortness of breath started yesterday but then when she laid down last night it became worse and she had trouble all night long.  She is also complaining of dyspnea on exertion.  She denies significant cough or productive cough.  No fever, chest pain, abdominal pain, nausea, vomiting or diarrhea.  She is still taking her medications but has not taken her morning medications.  She is not recently had any changes in her medications.  She denies any change in urine output.  She called her cardiologist today and they recommended she come for further evaluation given the shortness of breath has worsened.  The history is provided by the patient and medical records.  Shortness of Breath Severity:  Moderate Onset quality:  Gradual Duration:  24 hours Timing:  Constant Progression:  Worsening Chronicity:  Recurrent      Past Medical History:  Diagnosis Date  . Acute on chronic diastolic (congestive) heart failure ([place]) 01/22/2019  . Acute on chronic systolic CHF (congestive heart failure) ([place]) 03/04/2018  . Anemia   . Arthritis    "knees especially"  . Bradycardia   . Cardiomyopathy    presumed ischemic in the past with an EF of 35%. The most recent EF in 03-2008, however, was 55 -60%  . CHF 12/28/2007   Qualifier: Diagnosis of  By: Laney Potash, [name]    . Chronic anticoagulation 12/15/2015  . Chronic renal insufficiency   . Dementia ([place])    mild  . Dyslipidemia   . Encounter for therapeutic drug monitoring  12/25/2013  . Glaucoma   . Hypertension   . Malleolar fracture   . Pacemaker infection ([place]) 08/18/2012  . Pacemaker- Medtronic 12/25/2011   DOI 1/13   . PAF (paroxysmal atrial fibrillation) ([place]) 04/19/2017  . Paroxysmal atrial fibrillation (H[facility])   . Sinus node dysfunction ([place]) 12/28/2007   Qualifier: Diagnosis of  By: Laney Potash, [name]     . Tachycardia-bradycardia syndrome ([place]) 09/19/2011    Patient Active Problem List   Diagnosis Date Noted  . Atrial flutter ([place]) 01/25/2021  . A-fib ([place]) 12/12/2020  . CHF (congestive heart failure) ([place]) 12/29/2019  . Acute on chronic combined systolic and diastolic congestive heart failure ([place]) 12/28/2019  . Hypothyroidism   . Acute kidney injury superimposed on CKD ([place])   . LBBB (left bundle branch block) 12/06/2019  . Educated about COVID-19 virus infection 09/28/2019  . Acute on chronic diastolic (congestive) heart failure ([place]) 01/22/2019  . Acute on chronic systolic CHF (congestive heart failure) ([place]) 03/04/2018  . PAF (paroxysmal atrial fibrillation) ([place]) 04/19/2017  . Chronic anticoagulation 12/15/2015  . Encounter for therapeutic drug monitoring 12/25/2013  . Pacemaker infection ([place]) 08/18/2012  . Pacemaker- Medtronic 12/25/2011  . Tachycardia-bradycardia syndrome ([place]) 09/19/2011  . Chronic renal insufficiency, stage III (moderate) ([place]) 03/12/2010  . Dyslipidemia 12/28/2007  . ANEMIA 12/28/2007  . Dementia without behavioral disturbance ([place]) 12/28/2007  . Glaucoma 12/28/2007  . Hypertension 12/28/2007  . Cardiomyopathy EF 20% by echo [facility]) 12/28/2007  .  Atrial fibrillation with RVR ([place]) 12/28/2007  . Sinus node dysfunction ([place]) 12/28/2007  . CHF 12/28/2007    Past Surgical History:  Procedure Laterality Date  . ABDOMINAL HYSTERECTOMY    . CARDIOVERSION  07/2011  . EYE SURGERY  2002   "blood clot behind eye"  . FRACTURE SURGERY  ~ 2007   RLE  . HEMORRHOID SURGERY    . INSERT / REPLACE / REMOVE PACEMAKER  09/17/11    initial placement  . INSERT / REPLACE / REMOVE PACEMAKER  09/18/11  . PACEMAKER REVISION N/A 09/18/2011   Procedure: PACEMAKER REVISION;  Surgeon: Thompson Grayer, MD;  Location[facility] CATH LAB;  Service: Cardiovascular;  Laterality: N/A;  . PERMANENT PACEMAKER INSERTION N/A 09/17/2011   Procedure: PERMANENT PACEMAKER INSERTION;  Surgeon: Deboraha Sprang, MD;  Location: [facility] CATH LAB;  Service: Cardiovascular;  Laterality: N/A;  . TONSILLECTOMY AND ADENOIDECTOMY       OB History   No obstetric history on file.     Family History  Problem Relation Age of Onset  . Other Other        Noncontributory    Social History   Tobacco Use  . Smoking status: Never Smoker  . Smokeless tobacco: Never Used  Vaping Use  . Vaping Use: Never used  Substance Use Topics  . Alcohol use: No  . Drug use: No    Home Medications Prior to Admission medications   Medication Sig Start Date End Date Taking? Authorizing Provider  acetaminophen (TYLENOL) 500 MG tablet Take 325 mg by mouth every 6 (six) hours as needed for headache (pain).     [provider]  atorvastatin (LIPITOR) 10 MG tablet TAKE 1/2 TABLET(5 MG) BY MOUTH AT BEDTIME 09/26/20   Minus Breeding, MD  brimonidine (ALPHAGAN) 0.2 % ophthalmic solution Place 1 drop into both eyes daily. 04/02/16   [provider]  Famotidine (PEPCID AC MAXIMUM STRENGTH) 20 MG CHEW Chew 1 tablet by mouth at bedtime.     [provider]  furosemide (LASIX) 80 MG tablet TAKE 1 TABLET(80 MG) BY MOUTH DAILY 04/30/20   Minus Breeding, MD  IRON, FERROUS GLUCONATE, PO Take by mouth daily.    [provider]  levothyroxine (SYNTHROID) 50 MCG tablet Take 50 mcg by mouth daily before breakfast.    [provider]  Melatonin 3 MG CAPS Take 3 mg by mouth at bedtime.     [provider]  mirtazapine (REMERON) 7.5 MG tablet Take 7.5 mg by mouth at bedtime. 09/17/19   [provider]  Multiple Vitamin (MULTIVITAMIN WITH  MINERALS) TABS tablet Take 1 tablet by mouth daily.    [provider]  Olopatadine HCl 0.2 % SOLN Place 1 drop into both eyes daily.     [provider]  potassium chloride SA (KLOR-CON) 20 MEQ tablet Take 1 tablet (20 mEq total) by mouth 2 (two) times daily. 04/01/20   Minus Breeding, MD  senna (SENOKOT) 8.6 MG TABS tablet Take 1 tablet by mouth at bedtime as needed for mild constipation.    [provider]  Travoprost, BAK Free, (TRAVATAN) 0.004 % SOLN ophthalmic solution Place 1 drop into both eyes at bedtime.    [provider]  warfarin (COUMADIN) 2 MG tablet TAKE 1 TABLET BY MOUTH DAILY AS DIRECTED BY COUMADIN CLINIC 01/15/21   Minus Breeding, MD    Allergies    Patient has no known allergies.  Review of Systems   Review of Systems  Respiratory:  Positive for shortness of breath.   All other systems reviewed and are negative.   Physical Exam Updated Vital Signs BP 114/85 (BP Location: Left Arm)   Pulse (!) 126   Temp 97.7 F (36.5 C) (Oral)   Resp 20   SpO2 97%   Physical Exam Vitals and nursing note reviewed.  Constitutional:      General: She is not in acute distress.    Appearance: She is well-developed.  HENT:     Head: Normocephalic and atraumatic.     Mouth/Throat:     Mouth: Mucous membranes are moist.  Eyes:     Pupils: Pupils are equal, round, and reactive to light.  Neck:     Comments: Severe kyphosis Cardiovascular:     Rate and Rhythm: Tachycardia present. Rhythm irregularly irregular.     Heart sounds: Normal heart sounds. No murmur heard. No friction rub.  Pulmonary:     Effort: Pulmonary effort is normal. Tachypnea present.     Breath sounds: Examination of the right-middle field reveals rales. Examination of the right-lower field reveals rales. Examination of the left-lower field reveals rales. Rales present. No wheezing.  Abdominal:     General: Bowel sounds are normal. There is no distension.     Palpations:  Abdomen is soft.     Tenderness: There is no abdominal tenderness. There is no guarding or rebound.  Musculoskeletal:        General: No tenderness. Normal range of motion.     Cervical back: Neck supple.     Right lower leg: Edema present.     Left lower leg: No edema.     Comments: Trace pitting edema in the right ankle  Skin:    General: Skin is warm and dry.     Findings: No rash.  Neurological:     Mental Status: She is alert and oriented to person, place, and time. Mental status is at baseline.     Cranial Nerves: No cranial nerve deficit.  Psychiatric:        Mood and Affect: Mood normal.        Behavior: Behavior normal.        Thought Content: Thought content normal.     ED Results / Procedures / Treatments   Labs (all labs ordered are listed, but only abnormal results are displayed) Labs Reviewed  BASIC METABOLIC PANEL - Abnormal; Notable for the following components:      Result Value   Glucose, Bld 125 (*)    BUN 37 (*)    Creatinine, Ser 1.45 (*)    GFR, Estimated 33 (*)    All other components within normal limits  CBC - Abnormal; Notable for the following components:   RBC 3.61 (*)    Hemoglobin 11.6 (*)    MCV 104.4 (*)    All other components within normal limits  BRAIN NATRIURETIC PEPTIDE - Abnormal; Notable for the following components:   B Natriuretic Peptide 1,897.8 (*)    All other components within normal limits  HEPATIC FUNCTION PANEL - Abnormal; Notable for the following components:   AST 44 (*)    ALT 67 (*)    Total Bilirubin 1.7 (*)    Bilirubin, Direct 0.3 (*)    Indirect Bilirubin 1.4 (*)    All other components within normal limits  TROPONIN I (HIGH SENSITIVITY)  TROPONIN I (HIGH SENSITIVITY)    EKG EKG Interpretation  Date/Time:  Monday February 17 2021 09:46:21 EDT Ventricular Rate:  106 PR Interval:    QRS Duration: 156 QT Interval:  416 QTC Calculation: 552 R Axis:   6 Text Interpretation: Atrial fibrillation Left bundle  branch block  paced rhythm in the past Confirmed by Blanchie Dessert [phone]) on 02/17/2021 10:39:02 AM   Radiology DG Chest 2 View  Result Date: 02/17/2021 CLINICAL DATA:  Shortness of breath. EXAM: CHEST - 2 VIEW COMPARISON:  December 29, 2019. FINDINGS: Stable cardiomegaly with bilateral perihilar and basilar opacities concerning for pulmonary edema. Stable right pleural effusion noted. No pneumothorax is noted. Left-sided pacemaker is unchanged in position. IMPRESSION: Stable cardiomegaly with bilateral perihilar and basilar opacities concerning for pulmonary edema and associated right pleural effusion. Aortic Atherosclerosis (ICD10-I70.0). Electronically Signed   By: Marijo Conception M.D.   On: 02/17/2021 10:29    Procedures Procedures   Medications Ordered in ED Medications - No data to display  ED Course  I have reviewed the triage vital signs and the nursing notes.  Pertinent labs & imaging results that were available during my care of the patient were reviewed by me and considered in my medical decision making (see chart for details).    MDM Rules/Calculators/A&P                          Elderly female presenting today with complaints of orthopnea and dyspnea on exertion with a history of CHF and last echo in 2020 showing an EF of 20 to 25%.  Patient is in no acute distress at this time.  Oxygen saturation of 97% on room air but does have rales bilaterally.  Minimal swelling in her right ankle but no significant distal edema.  She has a prior history of CKD.  Chest x-ray is concerning for stable cardiomegaly with pulmonary edema and right-sided pleural effusion.  Patient has been taking her medications as prescribed and takes 80 mg of Lasix daily.  Last took yesterday.  She is in atrial fibrillation which is intermittently rate controlled.  Labs are pending.  Feel that patient most likely has CHF exacerbation that we will need treatment.  Currently at rest oxygen saturation of 97% on room  air.  12:28 PM Labs today with a BMP with intact creatinine 1.45, LFTs mildly elevated at AST of 44 and ALT of 67.  BNP is elevated at 1800 and troponin is normal at 13.  CBC without acute changes.  Patient's daughter is now present and reports 3 weeks ago she saw her cardiologist and they told her to stop taking her 40 mg Lasix in the afternoon and just to 80 in the morning.  She was starting to develop some symptoms so she added 20 mg back and was taking 100 mg every morning and then she saw her PCP several days ago who told her for the last 3 days to take the total of 120 mg which she has done but the shortness of breath has not improved.  Patient was also taken off amiodarone.  She has been in a tacky bradycardia rhythm here but is intermittently being paced.  Patient given 80 mg of IV Lasix here.  Will admit for further care.  MDM Number of Diagnoses or Management Options   Amount and/or Complexity of Data Reviewed Clinical lab tests: ordered and reviewed Tests in the radiology section of CPT: ordered and reviewed Tests in the medicine section of CPT: ordered and reviewed Decide to obtain previous medical records or to obtain history from someone  other than the patient: yes Obtain history from someone other than the patient: yes Review and summarize past medical records: yes Discuss the patient with other providers: yes Independent visualization of images, tracings, or specimens: yes  Risk of Complications, Morbidity, and/or Mortality Presenting problems: moderate Diagnostic procedures: moderate Management options: moderate  Patient Progress Patient progress: stable  CRITICAL CARE Performed by: Leonor Darnell Total critical care time: 30 minutes Critical care time was exclusive of separately billable procedures and treating other patients. Critical care was necessary to treat or prevent imminent or life-threatening deterioration. Critical care was time spent personally by me  on the following activities: development of treatment plan with patient and/or surrogate as well as nursing, discussions with consultants, evaluation of patient's response to treatment, examination of patient, obtaining history from patient or surrogate, ordering and performing treatments and interventions, ordering and review of laboratory studies, ordering and review of radiographic studies, pulse oximetry and re-evaluation of patient's condition.  Final Clinical Impression(s) / ED Diagnoses Final diagnoses:  Acute on chronic congestive heart failure, unspecified heart failure type [facility])    Rx / DC Orders ED Discharge Orders    None       Blanchie Dessert, MD 02/17/21 1233

## 2021-02-17 NOTE — Telephone Encounter (Signed)
Pt c/o Shortness Of Breath: STAT if SOB developed within the last 24 hours or pt is noticeably SOB on the phone  1. Are you currently SOB (can you hear that pt is SOB on the phone)?  No   2. How long have you been experiencing SOB? Last night and yesterday   3. Are you SOB when sitting or when up moving around? When laying down   4. Are you currently experiencing any other symptoms? No  Patient c/o Palpitations:  High priority if patient c/o lightheadedness, shortness of breath, or chest pain  1) How long have you had palpitations/irregular HR/ Afib? Are you having the symptoms now? Feb 2022/ no   2) Are you currently experiencing lightheadedness, SOB or CP?  A little short of breath   3) Do you have a history of afib (atrial fibrillation) or irregular heart rhythm? Yes   4) Have you checked your BP or HR? (document readings if available): 112/79, 134/96 , HR 120, 80  5) Are you experiencing any other symptoms? no

## 2021-02-17 NOTE — Telephone Encounter (Signed)
Received a call from our operator stating EMS is at patient's home.Patient complaining of sob.EMS stated patient's lungs are clear and given her history they wanted to know if patient needed to be transported to ED.Advised if patient having sob she will need to go to ED to be evaluated.

## 2021-02-17 NOTE — ED Notes (Signed)
hospitalist at bedside

## 2021-02-17 NOTE — ED Notes (Signed)
Attempted to call report, advised will call back, call back number provided.

## 2021-02-17 NOTE — ED Triage Notes (Addendum)
Pt arrived by EMS from home c/o shortness of breath. Denies pain  Pt states shortness of breath is worse when she tries to lay down  Per EMS heart rhythm changing between A.fbi and Normal sinus   Hx of CHF, Takes lasix

## 2021-02-17 NOTE — Progress Notes (Signed)
ANTICOAGULATION CONSULT NOTE - Initial Consult  Pharmacy Consult for warfarin Indication: atrial fibrillation  No Known Allergies  Patient Measurements:   Heparin Dosing Weight: n/a   Vital Signs: Temp: 97.7 F (36.5 C) (06/06 0953) Temp Source: Oral (06/06 0953) BP: 141/89 (06/06 1552) Pulse Rate: 79 (06/06 1552)  Labs: Recent Labs    02/17/21 0937 02/17/21 1120 02/17/21 1238  HGB 11.6*  --   --   HCT 37.7  --   --   PLT 156  --   --   CREATININE 1.45*  --   --   TROPONINIHS  --  13 12    CrCl cannot be calculated (Unknown ideal weight.).   Medical History: Past Medical History:  Diagnosis Date  . Acute on chronic diastolic (congestive) heart failure ([place]) 01/22/2019  . Acute on chronic systolic CHF (congestive heart failure) ([place]) 03/04/2018  . Anemia   . Arthritis    "knees especially"  . Bradycardia   . Cardiomyopathy    presumed ischemic in the past with an EF of 35%. The most recent EF in 03-2008, however, was 55 -60%  . CHF 12/28/2007   Qualifier: Diagnosis of  By: Laney Potash, [name]    . Chronic anticoagulation 12/15/2015  . Chronic renal insufficiency   . Dementia ([place])    mild  . Dyslipidemia   . Encounter for therapeutic drug monitoring 12/25/2013  . Glaucoma   . Hypertension   . Malleolar fracture   . Pacemaker infection ([place]) 08/18/2012  . Pacemaker- Medtronic 12/25/2011   DOI 1/13   . PAF (paroxysmal atrial fibrillation) ([place]) 04/19/2017  . Paroxysmal atrial fibrillation (HCC)   . Sinus node dysfunction ([place]) 12/28/2007   Qualifier: Diagnosis of  By: Laney Potash, [name]     . Tachycardia-bradycardia syndrome ([place]) 09/19/2011    Medications:  (Not in a hospital admission)   Assessment: [name] with h/o Afib on warfarin at home. INR on admission is therapeutic at 2. Last dose of warfarin was yesterday.   Home warfarin dose: 2 mg every day   Goal of Therapy:  INR 2-3 Monitor platelets by anticoagulation protocol: Yes   Plan:  -Warfarin 2 mg  daily -Monitor daily INR and watch for s/s of bleeding   Albertina Parr, PharmD., BCPS, BCCCP Clinical Pharmacist Please refer to [facility] for unit-specific pharmacist

## 2021-02-17 NOTE — Consult Note (Signed)
Cardiology Consultation:   Patient ID: [name] MRN: [id number]; DOB: Jan 26, 1923  Admit date: 02/17/2021 Date of Consult: [id number]  PCP:  Chriss Czar, MD   [facility] Providers Cardiologist:  Minus Breeding, MD  Electrophysiologist:  Virl Axe, MD     Patient Profile:   [name] is a 87 y.o. female with a hx of tachy-brady syndrome s/p PPM '13 c/b atrial lead dislodgement s/p revision, HFrEF, dementia, paroxysmal Afib, HLD, HTN, CKD stage iii who is being seen 02/17/2021 for the evaluation of abnormal EKG at the request of Dr. Ardelia Mems.  History of Present Illness:   [name] is a 87 yo female with PMH noted above.  She has been followed by Dr. Caryl Comes and Dr. Percival Spanish as an outpatient.  She has a known EF of 20 to 25% with global hypokinesis, severely dilated left atrium on last echo 01/2019. She was admitted 12/2019 with acute on chronic heart failure and diuresis.  Recommendation at that discharge was to continue p.o. Lasix 80 mg in the morning with 40 mg in the evening.  Was seen by Dr. Caryl Comes on 12/16/20 and decision was made to discontinue her amiodarone.  Her Lasix dose was adjusted to 80 mg in the morning. Most recently was seen in the office on 01/27/2021 with Dr. Percival Spanish.  She reported being in her usual state of health.  Uses a rolling walker to ambulate.  She was not having any new palpitations, or complaints of presyncope or syncope.  Heart rate in the office was documented at 4.  She was reported to be euvolemic but discussed the need of as needed 40 mg p.m. dosing.  She is anticoagulated with Coumadin.  She currently lives independently with her family checking on her routinely and has 2 aides that come to help with her every day.  Reports increasing shortness of breath that started about a week ago.  She was seen by her PCP on 5/31 and instructed to resume her Lasix dosing of 80 mg in the morning and 40 mg in the evening for 3 days.  Reports she did have decent  urine output at that time.  Has also noted intermittent palpitations over the past week as well.  Presented to the ED on 6/6 with complaints of progressive shortness of breath.  Daughter reports that she was not aware that the patient was having significant increasing shortness of breath.  Patient called and told her daughter of her symptoms this morning.  Daughter called into the office and was instructed to call EMS for transport to the ED.  Patient reports no chest pain prior to admission.  No lower extremity edema, but did have orthopnea.  In the ED her labs showed sodium 141, potassium 3.6, creatinine 1.4, AST 44, ALT 67, total bilirubin 1.7, BNP 1897, high-sensitivity troponin 13>>12, WBC 9.7, hemoglobin 11.6.  Chest x-ray with stable cardiomegaly with bilateral opacities concerning for pulmonary edema, right-sided pleural effusion.  EKG showed atrial flutter, 106 bpm, left bundle branch block (known).  She was given IV Lasix 80 mg in the ED, no urine output documented thus far.  Echo at bedside at time of assessment.   Past Medical History:  Diagnosis Date  . Acute on chronic diastolic (congestive) heart failure ([place]) 01/22/2019  . Acute on chronic systolic CHF (congestive heart failure) ([place]) 03/04/2018  . Anemia   . Arthritis    "knees especially"  . Bradycardia   . Cardiomyopathy    presumed ischemic in the past with  an EF of 35%. The most recent EF in 03-2008, however, was 55 -60%  . CHF 12/28/2007   Qualifier: Diagnosis of  By: Laney Potash, [name]    . Chronic anticoagulation 12/15/2015  . Chronic renal insufficiency   . Dementia ([place])    mild  . Dyslipidemia   . Encounter for therapeutic drug monitoring 12/25/2013  . Glaucoma   . Hypertension   . Malleolar fracture   . Pacemaker infection ([place]) 08/18/2012  . Pacemaker- [employer] 12/25/2011   DOI 1/13   . PAF (paroxysmal atrial fibrillation) ([place]) 04/19/2017  . Paroxysmal atrial fibrillation ([facility])   . Sinus node dysfunction ([place])  12/28/2007   Qualifier: Diagnosis of  By: Laney Potash, [name]     . Tachycardia-bradycardia syndrome ([place]) 09/19/2011    Past Surgical History:  Procedure Laterality Date  . ABDOMINAL HYSTERECTOMY    . CARDIOVERSION  07/2011  . EYE SURGERY  2002   "blood clot behind eye"  . FRACTURE SURGERY  ~ 2007   RLE  . HEMORRHOID SURGERY    . INSERT / REPLACE / REMOVE PACEMAKER  09/17/11   initial placement  . INSERT / REPLACE / REMOVE PACEMAKER  09/18/11  . PACEMAKER REVISION N/A 09/18/2011   Procedure: PACEMAKER REVISION;  Surgeon: Thompson Grayer, MD;  Location: [facility] CATH LAB;  Service: Cardiovascular;  Laterality: N/A;  . PERMANENT PACEMAKER INSERTION N/A 09/17/2011   Procedure: PERMANENT PACEMAKER INSERTION;  Surgeon: Deboraha Sprang, MD;  Location: [facility] CATH LAB;  Service: Cardiovascular;  Laterality: N/A;  . TONSILLECTOMY AND ADENOIDECTOMY       Home Medications:  Prior to Admission medications   Medication Sig Start Date End Date Taking? Authorizing Provider  acetaminophen (TYLENOL) 500 MG tablet Take 325 mg by mouth every 6 (six) hours as needed for headache (pain).    Yes [provider]  atorvastatin (LIPITOR) 10 MG tablet TAKE 1/2 TABLET(5 MG) BY MOUTH AT BEDTIME Patient taking differently: Take 5 mg by mouth at bedtime. 09/26/20  Yes Minus Breeding, MD  brimonidine (ALPHAGAN) 0.2 % ophthalmic solution Place 1 drop into both eyes daily. 04/02/16  Yes [provider]  Famotidine (PEPCID AC MAXIMUM STRENGTH) 20 MG CHEW Chew 1 tablet by mouth at bedtime.    Yes [provider]  IRON, FERROUS GLUCONATE, PO Take 1 tablet by mouth daily.   Yes [provider]  levothyroxine (SYNTHROID) 50 MCG tablet Take 50 mcg by mouth daily before breakfast.   Yes [provider]  Melatonin 3 MG CAPS Take 3 mg by mouth at bedtime.    Yes [provider]  mirtazapine (REMERON) 7.5 MG tablet Take 7.5 mg by mouth at bedtime. 09/17/19  Yes [provider]  Multiple  Vitamin (MULTIVITAMIN WITH MINERALS) TABS tablet Take 1 tablet by mouth daily.   Yes [provider]  Olopatadine HCl 0.2 % SOLN Place 1 drop into both eyes daily.    Yes [provider]  potassium chloride SA (KLOR-CON) 20 MEQ tablet Take 1 tablet (20 mEq total) by mouth 2 (two) times daily. 04/01/20  Yes Minus Breeding, MD  senna (SENOKOT) 8.6 MG TABS tablet Take 1 tablet by mouth at bedtime as needed for mild constipation.   Yes [provider]  Travoprost, BAK Free, (TRAVATAN) 0.004 % SOLN ophthalmic solution Place 1 drop into both eyes at bedtime.   Yes [provider]  warfarin (COUMADIN) 2 MG tablet TAKE 1 TABLET BY MOUTH DAILY AS DIRECTED BY COUMADIN CLINIC Patient  taking differently: Take 2 mg by mouth See admin instructions. TAKE 1 TABLET BY MOUTH DAILY AS DIRECTED BY COUMADIN CLINIC 01/15/21  Yes Minus Breeding, MD  furosemide (LASIX) 80 MG tablet TAKE 1 TABLET(80 MG) BY MOUTH DAILY 02/17/21   Minus Breeding, MD    Inpatient Medications: Scheduled Meds: . atorvastatin  5 mg Oral QHS  . brimonidine  1 drop Both Eyes Daily  . cephALEXin  250 mg Oral Q12H  . ferrous gluconate  324 mg Oral Q breakfast  . latanoprost  1 drop Both Eyes QHS  . [START ON 02/18/2021] levothyroxine  50 mcg Oral QAC breakfast  . mirtazapine  7.5 mg Oral QHS  . olopatadine  1 drop Both Eyes BID  . pantoprazole  40 mg Oral QHS  . sodium chloride flush  3 mL Intravenous Q12H  . warfarin  2 mg Oral See admin instructions   Continuous Infusions: . sodium chloride     PRN Meds: sodium chloride, acetaminophen, perflutren lipid microspheres (DEFINITY) IV suspension, senna, sodium chloride flush  Allergies:   No Known Allergies  Social History:   Social History   Socioeconomic History  . Marital status: Widowed    Spouse name: Not on file  . Number of children: Not on file  . Years of education: Not on file  . Highest education level: Not on file  Occupational History     Employer: UNEMPLOYED  Tobacco Use  . Smoking status: Never Smoker  . Smokeless tobacco: Never Used  Vaping Use  . Vaping Use: Never used  Substance and Sexual Activity  . Alcohol use: No  . Drug use: No  . Sexual activity: Never  Other Topics Concern  . Not on file  Social History Narrative  . Not on file   Social Determinants of Health   Financial Resource Strain: Not on file  Food Insecurity: Not on file  Transportation Needs: Not on file  Physical Activity: Not on file  Stress: Not on file  Social Connections: Not on file  Intimate Partner Violence: Not on file    Family History:    Family History  Problem Relation Age of Onset  . Other Other        Noncontributory     ROS:  Please see the history of present illness.   All other ROS reviewed and negative.     Physical Exam/Data:   Vitals:   02/17/21 1415 02/17/21 1430 02/17/21 1445 02/17/21 1552  BP: 114/88 116/90 123/80 (!) 141/89  Pulse: 89 (!) 42 (!) 51 79  Resp: 20 (!) 23 (!) 26 (!) 24  Temp:      TempSrc:      SpO2: 96% 96% 98% 94%   No intake or output data in the 24 hours ending 02/17/21 1700 Last 3 Weights 01/27/2021 12/16/2020 10/28/2020  Weight (lbs) 103 lb 105 lb 3.2 oz 107 lb  Weight (kg) 46.72 kg 47.718 kg 48.535 kg     There is no height or weight on file to calculate BMI.  General: Thin frail older female. HEENT: normal Lymph: no adenopathy Neck: + JVD to jaw at 90 degree angle Endocrine:  No thryomegaly Vascular: No carotid bruits Cardiac:  normal S1, S2; irregularly irregular, tachy; + systolic murmur  Lungs: Diminished bilaterally Abd: soft, nontender, no hepatomegaly  Ext: no edema Musculoskeletal:  No deformities, BUE and BLE strength normal and equal Skin: warm and dry  Neuro:  CNs 2-12 intact, no focal abnormalities noted Psych:  Normal  affect   EKG:  The EKG was personally reviewed and demonstrates: Appears to be atrial flutter, left bundle branch block (which is  known) Telemetry:  Telemetry was personally reviewed and demonstrates: Atrial flutter, mostly 2-1, intermittent V pacing, rates ranging between 80-120bpm  Relevant CV Studies:  Echo: 01/2019  IMPRESSIONS    1. The left ventricle has severely reduced systolic function, with an  ejection fraction of 20-25%. The cavity size was mildly dilated. Left  ventricular diastolic Doppler parameters are consistent with restrictive  filling. Elevated mean left atrial  pressure There is abnormal septal motion consistent with left bundle  branch block. Left ventrical global hypokinesis without regional wall  motion abnormalities.  2. Right ventricular systolic pressure is moderately elevated with an  estimated pressure of 55 mmHg.  3. There is mild mitral annular calcification present. The MR jet is  centrally-directed.  4. Tricuspid valve regurgitation is mild-moderate.  5. Mild thickening of the aortic valve Sclerosis without any evidence of  stenosis of the aortic valve. Aortic valve regurgitation is trivial by  color flow Doppler.  6. Left atrial size was severely dilated.  7. Right atrial size was mildly dilated.  8. When compared to the prior study: Previous images from 2009 are not  available for comparison, but reported normal LVEF 55-60%. Studies from  2007 reported LVEF 35-40%.   Laboratory Data:  High Sensitivity Troponin:   Recent Labs  Lab 02/17/21 1120 02/17/21 1238  TROPONINIHS 13 12     Chemistry Recent Labs  Lab 02/17/21 0937  NA 141  K 3.6  CL 102  CO2 27  GLUCOSE 125*  BUN 37*  CREATININE 1.45*  CALCIUM 9.4  GFRNONAA 33*  ANIONGAP 12    Recent Labs  Lab 02/17/21 1120  PROT 6.8  ALBUMIN 4.0  AST 44*  ALT 67*  ALKPHOS 73  BILITOT 1.7*   Hematology Recent Labs  Lab 02/17/21 0937  WBC 9.7  RBC 3.61*  HGB 11.6*  HCT 37.7  MCV 104.4*  MCH 32.1  MCHC 30.8  RDW 13.6  PLT 156   BNP Recent Labs  Lab 02/17/21 1120  BNP 1,897.8*     DDimer No results for input(s): DDIMER in the last 168 hours.   Radiology/Studies:  DG Chest 2 View  Result Date: 02/17/2021 CLINICAL DATA:  Shortness of breath. EXAM: CHEST - 2 VIEW COMPARISON:  December 29, 2019. FINDINGS: Stable cardiomegaly with bilateral perihilar and basilar opacities concerning for pulmonary edema. Stable right pleural effusion noted. No pneumothorax is noted. Left-sided pacemaker is unchanged in position. IMPRESSION: Stable cardiomegaly with bilateral perihilar and basilar opacities concerning for pulmonary edema and associated right pleural effusion. Aortic Atherosclerosis (ICD10-I70.0). Electronically Signed   By: Marijo Conception M.D.   On: 02/17/2021 10:29     Assessment and Plan:   [name] is a 87 y.o. female with a hx of tachy-brady syndrome s/p PPM '13 c/b atrial lead dislodgement s/p revision, HFrEF, dementia, paroxysmal Afib, HLD, HTN, CKD stage III who is being seen 02/17/2021 for the evaluation of abnormal EKG at the request of Dr. Ardelia Mems.  1.  Acute on chronic HFrEF: Patient with known EF of 20 to 25% based on echocardiogram 01/2019.  At most recent office visit back in May she was noted to be euvolemic.  Was instructed to continue with Lasix 80 mg daily in the morning and 40 mg in the evening as needed.  -- Seen by PCP last Tuesday 5/31 with recommendations to  increase Lasix dosing to 80 mg a.m., 40 mg p.m. x3 days without significant improvement. -- BNP 1897 and chest x-ray with pulmonary edema -- Was given 80 mg IV Lasix in the ED, though no urinary output documented thus far.   2.  Paroxysmal atrial fibrillation with RVR: Has been on amiodarone in the past, but was recently stopped at office visit with Dr. Caryl Comes as it was felt not to be beneficial long-term. --Heart rates on telemetry are in the 80-1 20 range, actually appears to be 2:1 atrial flutter with LBBB -- Discussed with daughter regarding treatment with beta-blocker which she reports being on  metoprolol in the past.  Does not recall reason for stopping.  Would plan to start low-dose metoprolol '25mg'$  BID for better rate control, can consolidate to Toprol XL prior to discharge -- may need to resume amiodarone -- coumadin per PharmD  3. Tachybrady syndrome s/p PPM: followed by Dr. Caryl Comes as an outpatient  4. Right foot redness/concern for cellulitis: does have redness to right foot. Afebrile, normal WBC -- per primary  5. HTN: stable with readings in the 123456 range systolic -- will add low dose metoprolol '25mg'$  BID  6. Hypothyroidism: in review of Dr. Olin Pia note, there was concern that hypothyroidism may have been amiodarone induced -- check TSH -- continue synthyroid  Risk Assessment/Risk Scores:      New York Heart Association (NYHA) Functional Class NYHA Class III  CHA2DS2-VASc Score = 6  This indicates a 9.7% annual risk of stroke. The patient's score is based upon: CHF History: Yes HTN History: Yes Diabetes History: No Stroke History: No Vascular Disease History: Yes Age Score: 2 Gender Score: 1    For questions or updates, please contact [place] Please consult [email or web address] for contact info under    Signed, Reino Bellis, NP  02/17/2021 5:00 PM

## 2021-02-17 NOTE — ED Notes (Signed)
Echo tech at bedside.

## 2021-02-17 NOTE — Progress Notes (Signed)
  Echocardiogram 2D Echocardiogram has been performed.  [name] 02/17/2021, 4:54 PM

## 2021-02-18 DIAGNOSIS — I480 Paroxysmal atrial fibrillation: Secondary | ICD-10-CM

## 2021-02-18 DIAGNOSIS — E039 Hypothyroidism, unspecified: Secondary | ICD-10-CM | POA: Diagnosis present

## 2021-02-18 DIAGNOSIS — I495 Sick sinus syndrome: Secondary | ICD-10-CM | POA: Diagnosis present

## 2021-02-18 DIAGNOSIS — E785 Hyperlipidemia, unspecified: Secondary | ICD-10-CM | POA: Diagnosis present

## 2021-02-18 DIAGNOSIS — I5033 Acute on chronic diastolic (congestive) heart failure: Secondary | ICD-10-CM | POA: Diagnosis present

## 2021-02-18 DIAGNOSIS — I509 Heart failure, unspecified: Secondary | ICD-10-CM | POA: Diagnosis present

## 2021-02-18 DIAGNOSIS — M2012 Hallux valgus (acquired), left foot: Secondary | ICD-10-CM | POA: Diagnosis present

## 2021-02-18 DIAGNOSIS — I4892 Unspecified atrial flutter: Secondary | ICD-10-CM | POA: Diagnosis present

## 2021-02-18 DIAGNOSIS — I10 Essential (primary) hypertension: Secondary | ICD-10-CM | POA: Diagnosis not present

## 2021-02-18 DIAGNOSIS — D539 Nutritional anemia, unspecified: Secondary | ICD-10-CM | POA: Diagnosis present

## 2021-02-18 DIAGNOSIS — I272 Pulmonary hypertension, unspecified: Secondary | ICD-10-CM | POA: Diagnosis not present

## 2021-02-18 DIAGNOSIS — Z20822 Contact with and (suspected) exposure to covid-19: Secondary | ICD-10-CM | POA: Diagnosis present

## 2021-02-18 DIAGNOSIS — N184 Chronic kidney disease, stage 4 (severe): Secondary | ICD-10-CM

## 2021-02-18 DIAGNOSIS — I255 Ischemic cardiomyopathy: Secondary | ICD-10-CM | POA: Diagnosis present

## 2021-02-18 DIAGNOSIS — R748 Abnormal levels of other serum enzymes: Secondary | ICD-10-CM | POA: Diagnosis present

## 2021-02-18 DIAGNOSIS — I130 Hypertensive heart and chronic kidney disease with heart failure and stage 1 through stage 4 chronic kidney disease, or unspecified chronic kidney disease: Secondary | ICD-10-CM | POA: Diagnosis present

## 2021-02-18 DIAGNOSIS — Z79890 Hormone replacement therapy (postmenopausal): Secondary | ICD-10-CM | POA: Diagnosis not present

## 2021-02-18 DIAGNOSIS — N1832 Chronic kidney disease, stage 3b: Secondary | ICD-10-CM | POA: Diagnosis present

## 2021-02-18 DIAGNOSIS — H409 Unspecified glaucoma: Secondary | ICD-10-CM | POA: Diagnosis present

## 2021-02-18 DIAGNOSIS — Z950 Presence of cardiac pacemaker: Secondary | ICD-10-CM | POA: Diagnosis not present

## 2021-02-18 DIAGNOSIS — Z7901 Long term (current) use of anticoagulants: Secondary | ICD-10-CM | POA: Diagnosis not present

## 2021-02-18 DIAGNOSIS — I5043 Acute on chronic combined systolic (congestive) and diastolic (congestive) heart failure: Secondary | ICD-10-CM | POA: Diagnosis not present

## 2021-02-18 DIAGNOSIS — M109 Gout, unspecified: Secondary | ICD-10-CM | POA: Diagnosis present

## 2021-02-18 DIAGNOSIS — M21611 Bunion of right foot: Secondary | ICD-10-CM | POA: Diagnosis present

## 2021-02-18 DIAGNOSIS — F0390 Unspecified dementia without behavioral disturbance: Secondary | ICD-10-CM | POA: Diagnosis present

## 2021-02-18 DIAGNOSIS — M21612 Bunion of left foot: Secondary | ICD-10-CM | POA: Diagnosis present

## 2021-02-18 DIAGNOSIS — L03115 Cellulitis of right lower limb: Secondary | ICD-10-CM | POA: Diagnosis present

## 2021-02-18 DIAGNOSIS — I447 Left bundle-branch block, unspecified: Secondary | ICD-10-CM | POA: Diagnosis present

## 2021-02-18 DIAGNOSIS — Z79899 Other long term (current) drug therapy: Secondary | ICD-10-CM | POA: Diagnosis not present

## 2021-02-18 LAB — CBC WITH DIFFERENTIAL/PLATELET
Abs Immature Granulocytes: 0.04 10*3/uL (ref 0.00–0.07)
Basophils Absolute: 0 10*3/uL (ref 0.0–0.1)
Basophils Relative: 0 %
Eosinophils Absolute: 0 10*3/uL (ref 0.0–0.5)
Eosinophils Relative: 0 %
HCT: 36.7 % (ref 36.0–46.0)
Hemoglobin: 11.7 g/dL — ABNORMAL LOW (ref 12.0–15.0)
Immature Granulocytes: 0 %
Lymphocytes Relative: 3 %
Lymphs Abs: 0.4 10*3/uL — ABNORMAL LOW (ref 0.7–4.0)
MCH: 33 pg (ref 26.0–34.0)
MCHC: 31.9 g/dL (ref 30.0–36.0)
MCV: 103.4 fL — ABNORMAL HIGH (ref 80.0–100.0)
Monocytes Absolute: 0.5 10*3/uL (ref 0.1–1.0)
Monocytes Relative: 4 %
Neutro Abs: 10.5 10*3/uL — ABNORMAL HIGH (ref 1.7–7.7)
Neutrophils Relative %: 93 %
Platelets: 150 10*3/uL (ref 150–400)
RBC: 3.55 MIL/uL — ABNORMAL LOW (ref 3.87–5.11)
RDW: 13.6 % (ref 11.5–15.5)
WBC: 11.4 10*3/uL — ABNORMAL HIGH (ref 4.0–10.5)
nRBC: 0 % (ref 0.0–0.2)

## 2021-02-18 LAB — BASIC METABOLIC PANEL
Anion gap: 10 (ref 5–15)
BUN: 39 mg/dL — ABNORMAL HIGH (ref 8–23)
CO2: 29 mmol/L (ref 22–32)
Calcium: 9 mg/dL (ref 8.9–10.3)
Chloride: 105 mmol/L (ref 98–111)
Creatinine, Ser: 1.69 mg/dL — ABNORMAL HIGH (ref 0.44–1.00)
GFR, Estimated: 28 mL/min — ABNORMAL LOW (ref >60)
Glucose, Bld: 112 mg/dL — ABNORMAL HIGH (ref 70–99)
Potassium: 3.5 mmol/L (ref 3.5–5.1)
Sodium: 144 mmol/L (ref 135–145)

## 2021-02-18 LAB — PROTIME-INR
INR: 2 — ABNORMAL HIGH (ref 0.8–1.2)
Prothrombin Time: 22.5 seconds — ABNORMAL HIGH (ref 11.4–15.2)

## 2021-02-18 LAB — IRON AND TIBC
Iron: 108 ug/dL (ref 28–170)
Saturation Ratios: 31 % (ref 10.4–31.8)
TIBC: 346 ug/dL (ref 250–450)
UIBC: 238 ug/dL

## 2021-02-18 LAB — GLUCOSE, CAPILLARY: Glucose-Capillary: 152 mg/dL — ABNORMAL HIGH (ref 70–99)

## 2021-02-18 LAB — MAGNESIUM: Magnesium: 2.3 mg/dL (ref 1.7–2.4)

## 2021-02-18 LAB — MRSA PCR SCREENING: MRSA by PCR: NEGATIVE

## 2021-02-18 LAB — FOLATE: Folate: 66 ng/mL (ref >5.9)

## 2021-02-18 LAB — FERRITIN: Ferritin: 35 ng/mL (ref 11–307)

## 2021-02-18 LAB — VITAMIN B12: Vitamin B-12: 746 pg/mL (ref 180–914)

## 2021-02-18 LAB — SARS CORONAVIRUS 2 (TAT 6-24 HRS): SARS Coronavirus 2: NEGATIVE

## 2021-02-18 LAB — TSH: TSH: 1.527 u[IU]/mL (ref 0.350–4.500)

## 2021-02-18 LAB — URIC ACID: Uric Acid, Serum: 9.7 mg/dL — ABNORMAL HIGH (ref 2.5–7.1)

## 2021-02-18 MED ORDER — POTASSIUM CHLORIDE CRYS ER 20 MEQ PO TBCR
40.0000 meq | EXTENDED_RELEASE_TABLET | ORAL | Status: AC
  Administered 2021-02-18 (×2): 40 meq via ORAL
  Filled 2021-02-18 (×2): qty 2

## 2021-02-18 MED ORDER — METOPROLOL SUCCINATE ER 50 MG PO TB24
50.0000 mg | ORAL_TABLET | Freq: Every day | ORAL | Status: DC
  Administered 2021-02-19 – 2021-02-20 (×2): 50 mg via ORAL
  Filled 2021-02-18 (×2): qty 1

## 2021-02-18 MED ORDER — FUROSEMIDE 10 MG/ML IJ SOLN
80.0000 mg | Freq: Two times a day (BID) | INTRAMUSCULAR | Status: AC
  Administered 2021-02-18 (×2): 80 mg via INTRAVENOUS
  Filled 2021-02-18 (×2): qty 8

## 2021-02-18 MED ORDER — METOPROLOL TARTRATE 25 MG PO TABS
25.0000 mg | ORAL_TABLET | Freq: Once | ORAL | Status: AC
  Administered 2021-02-18: 25 mg via ORAL
  Filled 2021-02-18: qty 1

## 2021-02-18 NOTE — Hospital Course (Addendum)
[name] is a 87 y.o. female presenting with increasing shortness of breath and orthopnea . PMH is significant for CHF, atrial fibrillation/tachybradycarida syndrome, HTN, glaucoma, hypothyroidism, dyslipidemia.  Acute on chronic HFrEF Patient admitted with shortness of breath and orthopnea in the setting of decreased Lasix dosing outpatient. In the ED, patient was given IV Lasix with good output. Echocardiogram was obtained and showed EF <20% with LV global hypokinesis, moderate LVH, severely dilated atria, severely elevated pulmonary artery systolic pressure. Cardiology was consulted and patient was further diuresed with IV Lasix '80mg'$  BID for 2 doses. Due to unimpressive output and continued pulmonary findings on exam, CXR was repeated and showed no change and patient was switched to Torsemide.  Paroxysmal atrial fibrillation with RVR  Atrial flutter Tachybrady syndrome s/p PPM Patient admitted with atrial fibrillation with alternating tachycardia and bradycardia. Cardiology was consulted and patient was started on Metoprolol succinate '50mg'$  daily.   Right foot cellulitis vs gout Erythematous right midfoot and bunion on admission, no obvious wound but with extension into the midfoot and minimally elevated WBC. Patient was started on renally dose Keflex with improvement initially. Due to concern for gout, uric acid level was checked and was elevated at 9.7. Over hospitalization the erythema in the right MTP did recur with significant pain. Patient was started on prednisone '30mg'$  initially, should consider colchicine renally dosed for the future. Patient currently on coumadin and allopurinol increases warfarin effectiveness and would require increased monitoring.   All other chronic conditions were stable during hospitalization.   Issues for follow-up: Monitor for resolution of erythematous right foot. If recurrence, consider evaluating for gout Possible gout diagnosis, patient was started on  prednisone for a 5 day course. Monitor for recurrence and consider renally dose colchicine.  Recommend lipid panel and colchicine  outpatient. C Cardiology rec'd Jardiance 10 mg however GFR<30 therefore not initiated at discharge.

## 2021-02-18 NOTE — Plan of Care (Signed)
  Problem: Education: Goal: Knowledge of General Education information will improve Description: Including pain rating scale, medication(s)/side effects and non-pharmacologic comfort measures Outcome: Progressing   Problem: Activity: Goal: Risk for activity intolerance will decrease Outcome: Progressing   Problem: Nutrition: Goal: Adequate nutrition will be maintained Outcome: Progressing   Problem: Coping: Goal: Level of anxiety will decrease Outcome: Progressing   

## 2021-02-18 NOTE — TOC Progression Note (Addendum)
Transition of Care [facility]) - Progression Note    Patient Details  Name: [name] MRN: [id number] Date of Birth: 1922/12/31  Transition of Care [employer]) CM/SW Contact  Zenon Mayo, RN Phone Number: 02/18/2021, 5:22 PM  Clinical Narrative:    NCM spoke with patient at bedside, she did not understand what the conversation was about, NCM contacted daughter [name].  NCM informed her that patient may need HH services at dc. She states they have had [facility] before and they really were satisfied with them, so she would like them again.  NCM made referral to [facility] in [facility]  for [place] .  They are able to take referral with soc on Friday or Saturday.  Patient has home oxygen with [employer], she has walker, lift chair, 3 n 1, and tub bench.  She has a scale , puls oximetry and bp cuff.  She consumes a low sodium diet as well. Daughter states she has aides from 8 to 1 and then 4 to 6 pm, also the daughter stays with her 3 nights a week.  NCM informed daughter that patient will most likely need someone to be with her 24/7.  Daughter states they do not have that at this time.  NCM asked patient if she would like to go to SNF to get some short tem rehab, she states she will think about it. For now she is set up with [facility] for [place], [place].  6/9- NCM spoke with patient again today, I wrote the information down for her to read, asking if she wants to go to skilled nursing facility or do she want to go home? She states she wants to go home.          Expected Discharge Plan and Services                                                 Social Determinants of Health (SDOH) Interventions    Readmission Risk Interventions No flowsheet data found.

## 2021-02-18 NOTE — Progress Notes (Signed)
Progress Note  Patient Name: [name] Date of Encounter: 02/18/2021  [facility] Cardiologist: Minus Breeding, MD   Subjective   Feeling well.  Breathing is improving.  Inpatient Medications    Scheduled Meds: . atorvastatin  5 mg Oral QHS  . brimonidine  1 drop Both Eyes Daily  . cephALEXin  250 mg Oral Q12H  . ferrous gluconate  324 mg Oral Q breakfast  . latanoprost  1 drop Both Eyes QHS  . levothyroxine  50 mcg Oral QAC breakfast  . metoprolol tartrate  25 mg Oral Q8H  . mirtazapine  7.5 mg Oral QHS  . olopatadine  1 drop Both Eyes BID  . pantoprazole  40 mg Oral QHS  . sodium chloride flush  3 mL Intravenous Q12H  . warfarin  2 mg Oral q1600  . Warfarin - Pharmacist Dosing Inpatient   Does not apply q1600   Continuous Infusions: . sodium chloride     PRN Meds: sodium chloride, acetaminophen, senna, sodium chloride flush   Vital Signs    Vitals:   02/18/21 0119 02/18/21 0423 02/18/21 0526 02/18/21 0727  BP:  115/76 122/76   Pulse:  60 63 63  Resp:  18  (!) 22  Temp:  98.1 F (36.7 C)  97.7 F (36.5 C)  TempSrc:  Oral  Oral  SpO2:  99%  95%  Weight: 44.9 kg       Intake/Output Summary (Last 24 hours) at 02/18/2021 0850 Last data filed at 02/18/2021 0100 Gross per 24 hour  Intake 123 ml  Output 1100 ml  Net -977 ml   Last 3 Weights 02/18/2021 02/17/2021 01/27/2021  Weight (lbs) 98 lb 15.8 oz 98 lb 15.8 oz 103 lb  Weight (kg) 44.9 kg 44.9 kg 46.72 kg      Telemetry    A paced V paced- Personally Reviewed  ECG    n/a - Personally Reviewed  Physical Exam   VS:  BP 122/76   Pulse 63   Temp 97.7 F (36.5 C) (Oral)   Resp (!) 22   Wt 44.9 kg   SpO2 95%   BMI 19.99 kg/m  , BMI Body mass index is 19.99 kg/m. GENERAL: Frail elderly woman in no acute distress. HEENT: Pupils equal round and reactive, fundi not visualized, oral mucosa unremarkable NECK:  + jugular venous distention 2 cm above the clavicle sitting upright, waveform within  normal limits, carotid upstroke brisk and symmetric, no bruits LUNGS:  Clear to auscultation bilaterally HEART:  RRR.  PMI not displaced or sustained,S1 and S2 within normal limits, no S3, no S4, no clicks, no rubs, III/VI systolic murmurs ABD:  Flat, positive bowel sounds normal in frequency in pitch, no bruits, no rebound, no guarding, no midline pulsatile mass, no hepatomegaly, no splenomegaly EXT:  2 plus pulses throughout, no edema, no cyanosis no clubbing SKIN:  No rashes no nodules NEURO:  Cranial nerves II through XII grossly intact, motor grossly intact throughout PSYCH:  Cognitively intact, oriented to person place and time   Labs    High Sensitivity Troponin:   Recent Labs  Lab 02/17/21 1120 02/17/21 1238  TROPONINIHS 13 12      Chemistry Recent Labs  Lab 02/17/21 0937 02/17/21 1120 02/18/21 0056  NA 141  --  144  K 3.6  --  3.5  CL 102  --  105  CO2 27  --  29  GLUCOSE 125*  --  112*  BUN 37*  --  39*  CREATININE 1.45*  --  1.69*  CALCIUM 9.4  --  9.0  PROT  --  6.8  --   ALBUMIN  --  4.0  --   AST  --  44*  --   ALT  --  67*  --   ALKPHOS  --  73  --   BILITOT  --  1.7*  --   GFRNONAA 33*  --  28*  ANIONGAP 12  --  10     Hematology Recent Labs  Lab 02/17/21 0937 02/18/21 0056  WBC 9.7 11.4*  RBC 3.61* 3.55*  HGB 11.6* 11.7*  HCT 37.7 36.7  MCV 104.4* 103.4*  MCH 32.1 33.0  MCHC 30.8 31.9  RDW 13.6 13.6  PLT 156 150    BNP Recent Labs  Lab 02/17/21 1120  BNP 1,897.8*     DDimer No results for input(s): DDIMER in the last 168 hours.   Radiology    DG Chest 2 View  Result Date: 02/17/2021 CLINICAL DATA:  Shortness of breath. EXAM: CHEST - 2 VIEW COMPARISON:  December 29, 2019. FINDINGS: Stable cardiomegaly with bilateral perihilar and basilar opacities concerning for pulmonary edema. Stable right pleural effusion noted. No pneumothorax is noted. Left-sided pacemaker is unchanged in position. IMPRESSION: Stable cardiomegaly with bilateral  perihilar and basilar opacities concerning for pulmonary edema and associated right pleural effusion. Aortic Atherosclerosis (ICD10-I70.0). Electronically Signed   By: Marijo Conception M.D.   On: 02/17/2021 10:29   ECHOCARDIOGRAM COMPLETE  Result Date: 02/17/2021    ECHOCARDIOGRAM REPORT   Patient Name:   [name] Date of Exam: 02/17/2021 Medical Rec #:  [id number]     Height:       59.0 in Accession #:    [id number]    Weight:       103.0 lb Date of Birth:  Mar 15, 1923    BSA:          1.391 m Patient Age:    87 years      BP:           123/80 mmHg Patient Gender: F             HR:           105 bpm. Exam Location:  Inpatient Procedure: 2D Echo, Cardiac Doppler, Color Doppler and Intracardiac            Opacification Agent Indications:    Dyspnea  History:        Patient has prior history of Echocardiogram examinations, most                 recent 01/21/2019. CHF, Pacemaker, Arrythmias:Atrial Fibrillation;                 Risk Factors:Hypertension. CKD.  Sonographer:    Clayton Lefort RDCS (AE) Referring Phys: Nora  1. Left ventricular ejection fraction, by estimation, is <20%. The left ventricle has severely decreased function. The left ventricle demonstrates global hypokinesis. There is moderate left ventricular hypertrophy. Left ventricular diastolic function could not be evaluated.  2. Right ventricular systolic function is moderately reduced. The right ventricular size is moderately enlarged. There is severely elevated pulmonary artery systolic pressure. The estimated right ventricular systolic pressure is 99991111 mmHg.  3. Left atrial size was severely dilated.  4. Right atrial size was severely dilated. catheter.  5. The mitral valve is abnormal. Moderate to severe mitral valve regurgitation.  6. Tricuspid valve regurgitation is  mild to moderate.  7. The aortic valve is tricuspid. There is mild calcification of the aortic valve. There is mild thickening of the aortic valve. Aortic  valve regurgitation is mild. Mild aortic valve sclerosis is present, with no evidence of aortic valve stenosis.  8. The inferior vena cava is normal in size with <50% respiratory variability, suggesting right atrial pressure of 8 mmHg. Comparison(s): Changes from prior study are noted. EF has decreased, severe pulmonary hypertension noted, severe biatrial enlargement. FINDINGS  Left Ventricle: LV thrombus excluded by contrast. Left ventricular ejection fraction, by estimation, is <20%. The left ventricle has severely decreased function. The left ventricle demonstrates global hypokinesis. Definity contrast agent was given IV to  delineate the left ventricular endocardial borders. The left ventricular internal cavity size was normal in size. There is moderate left ventricular hypertrophy. Abnormal (paradoxical) septal motion, consistent with left bundle branch block. Left ventricular diastolic function could not be evaluated due to atrial fibrillation. Left ventricular diastolic function could not be evaluated. Right Ventricle: The right ventricular size is moderately enlarged. Right vetricular wall thickness was not well visualized. Right ventricular systolic function is moderately reduced. There is severely elevated pulmonary artery systolic pressure. The tricuspid regurgitant velocity is 4.18 m/s, and with an assumed right atrial pressure of 8 mmHg, the estimated right ventricular systolic pressure is 99991111 mmHg. Left Atrium: Left atrial size was severely dilated. Right Atrium: Right atrial size was severely dilated. Catheter. Pericardium: There is no evidence of pericardial effusion. Mitral Valve: The mitral valve is abnormal. Moderate to severe mitral valve regurgitation. Tricuspid Valve: The tricuspid valve is normal in structure. Tricuspid valve regurgitation is mild to moderate. Aortic Valve: The aortic valve is tricuspid. There is mild calcification of the aortic valve. There is mild thickening of the aortic  valve. Aortic valve regurgitation is mild. Aortic regurgitation PHT measures 669 msec. Mild aortic valve sclerosis is present, with no evidence of aortic valve stenosis. Aortic valve mean gradient measures 1.8 mmHg. Aortic valve peak gradient measures 3.0 mmHg. Aortic valve area, by VTI measures 2.44 cm. Pulmonic Valve: The pulmonic valve was grossly normal. Pulmonic valve regurgitation is mild. No evidence of pulmonic stenosis. Aorta: The aortic root, ascending aorta and aortic arch are all structurally normal, with no evidence of dilitation or obstruction. Venous: The inferior vena cava is normal in size with less than 50% respiratory variability, suggesting right atrial pressure of 8 mmHg. IAS/Shunts: The atrial septum is grossly normal. Additional Comments: There is a small pleural effusion in the left lateral region.  LEFT VENTRICLE PLAX 2D LVIDd:         4.90 cm LVIDs:         4.50 cm LV PW:         1.70 cm LV IVS:        1.70 cm LVOT diam:     2.10 cm LV SV:         28 LV SV Index:   20 LVOT Area:     3.46 cm  RIGHT VENTRICLE            IVC RV Basal diam:  4.30 cm    IVC diam: 1.60 cm RV Mid diam:    2.20 cm RV S prime:     6.38 cm/s TAPSE (M-mode): 1.3 cm LEFT ATRIUM              Index        RIGHT ATRIUM  Index LA diam:        5.80 cm  4.17 cm/m   RA Area:     27.80 cm LA Vol (A2C):   171.0 ml 122.92 ml/m RA Volume:   102.00 ml 73.32 ml/m LA Vol (A4C):   182.0 ml 130.83 ml/m LA Biplane Vol: 179.0 ml 128.67 ml/m  AORTIC VALVE AV Area (Vmax):    2.37 cm AV Area (Vmean):   2.17 cm AV Area (VTI):     2.44 cm AV Vmax:           86.78 cm/s AV Vmean:          61.240 cm/s AV VTI:            0.117 m AV Peak Grad:      3.0 mmHg AV Mean Grad:      1.8 mmHg LVOT Vmax:         59.44 cm/s LVOT Vmean:        38.380 cm/s LVOT VTI:          0.082 m LVOT/AV VTI ratio: 0.70 AI PHT:            669 msec  AORTA Ao Root diam: 3.00 cm Ao Asc diam:  3.30 cm MR Peak grad:    117.5 mmHg  TRICUSPID VALVE MR Mean  grad:    60.0 mmHg   TR Peak grad:   69.9 mmHg MR Vmax:         542.00 cm/s TR Vmax:        418.00 cm/s MR Vmean:        342.0 cm/s MR PISA:         2.26 cm    SHUNTS MR PISA Eff ROA: 17 mm      Systemic VTI:  0.08 m MR PISA Radius:  0.60 cm     Systemic Diam: 2.10 cm Buford Dresser MD Electronically signed by Buford Dresser MD Signature Date/Time: 02/17/2021/6:48:56 PM    Final     Cardiac Studies   Echo 02/17/21: 1. Left ventricular ejection fraction, by estimation, is <20%. The left  ventricle has severely decreased function. The left ventricle demonstrates  global hypokinesis. There is moderate left ventricular hypertrophy. Left  ventricular diastolic function  could not be evaluated.  2. Right ventricular systolic function is moderately reduced. The right  ventricular size is moderately enlarged. There is severely elevated  pulmonary artery systolic pressure. The estimated right ventricular  systolic pressure is 99991111 mmHg.  3. Left atrial size was severely dilated.  4. Right atrial size was severely dilated. catheter.  5. The mitral valve is abnormal. Moderate to severe mitral valve  regurgitation.  6. Tricuspid valve regurgitation is mild to moderate.  7. The aortic valve is tricuspid. There is mild calcification of the  aortic valve. There is mild thickening of the aortic valve. Aortic valve  regurgitation is mild. Mild aortic valve sclerosis is present, with no  evidence of aortic valve stenosis.  8. The inferior vena cava is normal in size with <50% respiratory  variability, suggesting right atrial pressure of 8 mmHg.   Patient Profile     [name] is a 61F with chronic systolic and diastolic heart failure, hypertension, hyperlipidemia, sick sinus syndrome status post pacemaker, persistent atrial fibrillation, and CKD 3 admitted with acute on chronic systolic and diastolic heart failure and atrial fibrillation with RVR.   Assessment & Plan    #Acute on  chronic systolic diastolic heart failure: # RV  failure: LVEF <20% this admission.  It is been 20 to 25% at baseline.  BNP was 1897.  She has received 1 dose of Lasix 80 mg and is net negative about 1 L.  Her JVD is still elevated.  We will give Lasix 80 mg IV twice daily for 2 doses then likely switch back to oral tomorrow.  We will switch her metoprolol to 50 mg of succinate tomorrow.  She is not on any ACE inhibitor/ARB/Entresto due to CKD.  Blood pressure was too low for hydralazine and nitrates.  Her pulmonary pressures are severely elevated.  I suspect this is due to left-sided heart failure.  PE is very unlikely given that she has been therapeutic on warfarin.  Will defer further work-up given her age.  No plans for rhythm control strategy as she had refractory atrial arrhythmias despite being on amiodarone and this was likely the cause of her hypothyroidism.  #Atrial fibrillation/flutter: She converted to a paced/V paced last night around 10 PM.  Consulting metoprolol as above.  Continue warfarin.  # Hypothyroidism: TSH well-controlled on levothyroxine.       For questions or updates, please contact [place] Please consult [email or web address] for contact info under        Signed, Skeet Latch, MD  02/18/2021, 8:50 AM

## 2021-02-18 NOTE — Progress Notes (Addendum)
FPTS Interim Progress Note  Patient sitting in chair and resting comfortably. Reports breathing has improved.  Denies any chest pain or worsening shortness of breath.   Rounded with primary RN. Reports patient was on room air in ED but since moved to floor had episode of desaturations in high 80's that required 2 L oxygen.  Patient sits in chair at home to sleep and appear comfortable. Voided 1100 cc thus far after Lasix 80 mg IV given. On exam lung sounds diminished at bases, trace lower extremity edema bilaterally and no JVD appreciated.   No concerns voiced.  No orders required.  Appreciated nightly round.  Today's Vitals   02/17/21 2044 02/17/21 2100 02/18/21 0001 02/18/21 0119  BP:  122/84 108/73   Pulse:  69 69   Resp:  (!) 23 15   Temp: (!) 97 F (36.1 C) 98.2 F (36.8 C) 98.3 F (36.8 C)   TempSrc: Axillary Oral Oral   SpO2:  92% 95%   Weight:  44.9 kg  44.9 kg  PainSc:  0-No pain 0-No pain     Will continue to monitor respiratory status   Carollee Leitz, MD 02/18/2021, 2:16 AM PGY-2, [place] [facility] pager [phone]

## 2021-02-18 NOTE — Progress Notes (Addendum)
Family Medicine Teaching Service Daily Progress Note Intern Pager: [phone]  Patient name: [name] Medical record number: [id number] Date of birth: Nov 29, 1922 Age: 87 y.o. Gender: female  Primary Care Provider: Chriss Czar, MD Consultants: Cardiology  Code Status: Full  Pt Overview and Major Events to Date:  6/6 - Admitted  Assessment and Plan: [name] is a 87 y.o. female presenting with increasing shortness of breath and orthopnea . PMH is significant for CHF, atrial fibrillation/tachybradycarida syndrome, HTN, glaucoma, hypothyroidism, dyslipidemia.  Acute on chronic HFrEF Echocardiogram showed EF <20% with LV global hypokinesis, moderate LVH; moderately reduced RV systolic function and moderately enlarged, severely elevated pulmonary artery systolic pressure; atria severely dilated, moderate MVR, right atrial pressure of 62mHg..Marland KitchenPatient s/p IV Lasix '80mg'$  with 1100 total output (unsure if output was documented while in the ED). Overnight patient had desaturations and was placed on oxygen, patient does not typically have baseline requirement but does have oxygen at home from prior hospitalization. Overnight patient was given potassium supplementation. - Cardiology consulted, appreciate recs - Cardiac telemetry - Daily weights - Strict Is&Os  - PT/OT eval and treat - Follow-up potassium and mag levels - Consider another dose of Lasix today, followed by cards  Paroxysmal atrial fibrillation with RVR  Atrial flutter Tachybrady syndrome s/p PPM Started on beta blocker with improved rate control with HR in the 60s overnight. Amiodarone has not been restarted. PT/INR 22.5/2.0 - Cardiology consulted, appreciate recs - Cardiac telemetry - Continue metoprolol '25mg'$  BID  - Coumadin per pharmacy  Right foot cellulitis vs gout Reassuringly remains afebrile, WBC elevated to 11.4 with neutrophil predominance. Uric acid level 9.7, which is elevated and given clinical picture  would be consistent with gout, though the elevated WBC is concerning and we will consider keeping antibiotics.  - Continue Keflex '250mg'$  BID x5 days (day 2/5) - Monitor fever curve  Hypothyroidism TSH 1.527, free T4 1.92 - Continue home levothyroxine 544m daily  Macrocytic anemia Hgb stable at 11.7, MCV 103.4.  Anemia panel overall wnl with ferritin 35, folate 66, B12 746. - Continue to monitor  CKD Stage IIIb Creatinine 1.45>1.69 (baseline around 1.5-1.7).  - Continue to monitor with BMP  HTN BP range since admission 99-177/71-133, most recently 122/76 - Continue metoprolol '25mg'$  BID  Dyslipidemia - Continue home atorvastatin   FEN/GI: Heart healthy PPx: on Coumadin for anticoagulation   Status is: Observation  The patient remains OBS appropriate and will d/c before 2 midnights.  Dispo: The patient is from: Home              Anticipated d/c is to: Home              Patient currently is not medically stable to d/c.   Difficult to place patient No   Subjective:  Patient reports that she is feeling little better this morning, her breathing has improved and is close to her baseline.  She has no new complaints today and says that her foot does not hurt.   Objective: Temp:  [97 F (36.1 C)-98.3 F (36.8 C)] 97.7 F (36.5 C) (06/07 0727) Pulse Rate:  [38-128] 63 (06/07 0727) Resp:  [15-30] 22 (06/07 0727) BP: (99-177)/(71-133) 122/76 (06/07 0526) SpO2:  [88 %-100 %] 95 % (06/07 0727) Weight:  [44.9 kg] 44.9 kg (06/07 0119) Physical Exam: General: NAD, sitting up in recliner, thin, elderly female Cardiovascular: irregularly irregular, systolic murmur Respiratory: [place] in place at 1L with O2 sat 95%, bibasilar crackles Abdomen: soft, non-tender, non-distended Extremities:  right foot erythema improved and no longer warm, non-tender to palpation bilateraly  Laboratory: Recent Labs  Lab 02/17/21 0937 02/18/21 0056  WBC 9.7 11.4*  HGB 11.6* 11.7*  HCT 37.7 36.7  PLT  156 150   Recent Labs  Lab 02/17/21 0937 02/17/21 1120 02/18/21 0056  NA 141  --  144  K 3.6  --  3.5  CL 102  --  105  CO2 27  --  29  BUN 37*  --  39*  CREATININE 1.45*  --  1.69*  CALCIUM 9.4  --  9.0  PROT  --  6.8  --   BILITOT  --  1.7*  --   ALKPHOS  --  73  --   ALT  --  67*  --   AST  --  44*  --   GLUCOSE 125*  --  112*     Imaging/Diagnostic Tests: DG Chest 2 View  Result Date: 02/17/2021 CLINICAL DATA:  Shortness of breath. EXAM: CHEST - 2 VIEW COMPARISON:  December 29, 2019. FINDINGS: Stable cardiomegaly with bilateral perihilar and basilar opacities concerning for pulmonary edema. Stable right pleural effusion noted. No pneumothorax is noted. Left-sided pacemaker is unchanged in position. IMPRESSION: Stable cardiomegaly with bilateral perihilar and basilar opacities concerning for pulmonary edema and associated right pleural effusion. Aortic Atherosclerosis (ICD10-I70.0). Electronically Signed   By: Marijo Conception M.D.   On: 02/17/2021 10:29   ECHOCARDIOGRAM COMPLETE  Result Date: 02/17/2021    ECHOCARDIOGRAM REPORT   Patient Name:   [name] Date of Exam: 02/17/2021 Medical Rec #:  [id number]     Height:       59.0 in Accession #:    [id number]    Weight:       103.0 lb Date of Birth:  [birth year]    BSA:          1.391 m Patient Age:    89 years      BP:           123/80 mmHg Patient Gender: F             HR:           105 bpm. Exam Location:  Inpatient Procedure: 2D Echo, Cardiac Doppler, Color Doppler and Intracardiac            Opacification Agent Indications:    Dyspnea  History:        Patient has prior history of Echocardiogram examinations, most                 recent 01/21/2019. CHF, Pacemaker, Arrythmias:Atrial Fibrillation;                 Risk Factors:Hypertension. CKD.  Sonographer:    [name] RDCS (AE) Referring Phys: [place]  1. Left ventricular ejection fraction, by estimation, is <20%. The left ventricle has severely decreased  function. The left ventricle demonstrates global hypokinesis. There is moderate left ventricular hypertrophy. Left ventricular diastolic function could not be evaluated.  2. Right ventricular systolic function is moderately reduced. The right ventricular size is moderately enlarged. There is severely elevated pulmonary artery systolic pressure. The estimated right ventricular systolic pressure is 99991111 mmHg.  3. Left atrial size was severely dilated.  4. Right atrial size was severely dilated. catheter.  5. The mitral valve is abnormal. Moderate to severe mitral valve regurgitation.  6. Tricuspid valve regurgitation is mild to moderate.  7. The aortic valve  is tricuspid. There is mild calcification of the aortic valve. There is mild thickening of the aortic valve. Aortic valve regurgitation is mild. Mild aortic valve sclerosis is present, with no evidence of aortic valve stenosis.  8. The inferior vena cava is normal in size with <50% respiratory variability, suggesting right atrial pressure of 8 mmHg. Comparison(s): Changes from prior study are noted. EF has decreased, severe pulmonary hypertension noted, severe biatrial enlargement. FINDINGS  Left Ventricle: LV thrombus excluded by contrast. Left ventricular ejection fraction, by estimation, is <20%. The left ventricle has severely decreased function. The left ventricle demonstrates global hypokinesis. Definity contrast agent was given IV to  delineate the left ventricular endocardial borders. The left ventricular internal cavity size was normal in size. There is moderate left ventricular hypertrophy. Abnormal (paradoxical) septal motion, consistent with left bundle branch block. Left ventricular diastolic function could not be evaluated due to atrial fibrillation. Left ventricular diastolic function could not be evaluated. Right Ventricle: The right ventricular size is moderately enlarged. Right vetricular wall thickness was not well visualized. Right ventricular  systolic function is moderately reduced. There is severely elevated pulmonary artery systolic pressure. The tricuspid regurgitant velocity is 4.18 m/s, and with an assumed right atrial pressure of 8 mmHg, the estimated right ventricular systolic pressure is 99991111 mmHg. Left Atrium: Left atrial size was severely dilated. Right Atrium: Right atrial size was severely dilated. Catheter. Pericardium: There is no evidence of pericardial effusion. Mitral Valve: The mitral valve is abnormal. Moderate to severe mitral valve regurgitation. Tricuspid Valve: The tricuspid valve is normal in structure. Tricuspid valve regurgitation is mild to moderate. Aortic Valve: The aortic valve is tricuspid. There is mild calcification of the aortic valve. There is mild thickening of the aortic valve. Aortic valve regurgitation is mild. Aortic regurgitation PHT measures 669 msec. Mild aortic valve sclerosis is present, with no evidence of aortic valve stenosis. Aortic valve mean gradient measures 1.8 mmHg. Aortic valve peak gradient measures 3.0 mmHg. Aortic valve area, by VTI measures 2.44 cm. Pulmonic Valve: The pulmonic valve was grossly normal. Pulmonic valve regurgitation is mild. No evidence of pulmonic stenosis. Aorta: The aortic root, ascending aorta and aortic arch are all structurally normal, with no evidence of dilitation or obstruction. Venous: The inferior vena cava is normal in size with less than 50% respiratory variability, suggesting right atrial pressure of 8 mmHg. IAS/Shunts: The atrial septum is grossly normal. Additional Comments: There is a small pleural effusion in the left lateral region.  LEFT VENTRICLE PLAX 2D LVIDd:         4.90 cm LVIDs:         4.50 cm LV PW:         1.70 cm LV IVS:        1.70 cm LVOT diam:     2.10 cm LV SV:         28 LV SV Index:   20 LVOT Area:     3.46 cm  RIGHT VENTRICLE            IVC RV Basal diam:  4.30 cm    IVC diam: 1.60 cm RV Mid diam:    2.20 cm RV S prime:     6.38 cm/s TAPSE  (M-mode): 1.3 cm LEFT ATRIUM              Index        RIGHT ATRIUM           Index LA diam:  5.80 cm  4.17 cm/m   RA Area:     27.80 cm LA Vol (A2C):   171.0 ml 122.92 ml/m RA Volume:   102.00 ml 73.32 ml/m LA Vol (A4C):   182.0 ml 130.83 ml/m LA Biplane Vol: 179.0 ml 128.67 ml/m  AORTIC VALVE AV Area (Vmax):    2.37 cm AV Area (Vmean):   2.17 cm AV Area (VTI):     2.44 cm AV Vmax:           86.78 cm/s AV Vmean:          61.240 cm/s AV VTI:            0.117 m AV Peak Grad:      3.0 mmHg AV Mean Grad:      1.8 mmHg LVOT Vmax:         59.44 cm/s LVOT Vmean:        38.380 cm/s LVOT VTI:          0.082 m LVOT/AV VTI ratio: 0.70 AI PHT:            669 msec  AORTA Ao Root diam: 3.00 cm Ao Asc diam:  3.30 cm MR Peak grad:    117.5 mmHg  TRICUSPID VALVE MR Mean grad:    60.0 mmHg   TR Peak grad:   69.9 mmHg MR Vmax:         542.00 cm/s TR Vmax:        418.00 cm/s MR Vmean:        342.0 cm/s MR PISA:         2.26 cm    SHUNTS MR PISA Eff ROA: 17 mm      Systemic VTI:  0.08 m MR PISA Radius:  0.60 cm     Systemic Diam: 2.10 cm Buford Dresser MD Electronically signed by Buford Dresser MD Signature Date/Time: 02/17/2021/6:48:56 PM    Final      [name], DO 02/18/2021, 8:35 AM PGY-1, [place] Intern pager: [phone], text pages welcome

## 2021-02-18 NOTE — Progress Notes (Signed)
Heart Failure Navigator Progress Note  Assessed for Heart & Vascular TOC clinic readiness.  Unfortunately at this time the patient does not meet criteria due to renal insufficiency - SCr up to 1.7 (CrCl < 20). Will continue to monitor progression during this hospitalization and re-evaluate clinic readiness prior to discharge.  Navigator available for reassessment of patient.   Kerby Nora, PharmD, BCPS Heart Failure Stewardship Pharmacist Phone [phone]

## 2021-02-18 NOTE — Plan of Care (Signed)

## 2021-02-18 NOTE — Progress Notes (Signed)
Occupational Therapy Evaluation Patient Details Name: [name] MRN: [id number] DOB: 10-17-1922 Today's Date: 02/18/2021    History of Present Illness 87 yo admitted 6/6 with CHF exacerbation and AFib with RVR. PMhx: CHF, HTN, HLD, sick sinus syndrome s/p PPM, Afib, CKD, NICM with EF 20%   Clinical Impression   Evaluation significantly limited by severity of HOH of pt, pleasant and agreeable noted decreased insight to limitations and deficits presenting this date with decreased activity tolerance, balance and strength.vitals monitored throughout, no significant change noted with transfer training or seated task. Pt requiring min A for functional transfers and repositioning and at least min and at times up to mod A for self care tasks including LB and toileting this date. Per discussion with PT of PLOF and social history OT with concerns about pt's preference for return to home alone with intermittent periods of alone throughout day and overnight. Pt at this time will require 24hr S/A and might benefit from post acute therapy. OT will continue to follow acutely.     Follow Up Recommendations  Supervision/Assistance - 24 hour;SNF pt may benefit from [facility] pending ability of family and hired aides to provide 24hr S/A.    Equipment Recommendations   (TBD)    Recommendations for Other Services       Precautions / Restrictions Precautions Precautions: Fall Precaution Comments: VERY HOH Restrictions Weight Bearing Restrictions: No      Mobility Bed Mobility               General bed mobility comments: in chair on arrival and end of session per pt request    Transfers Overall transfer level: Needs assistance Equipment used: Rolling walker (2 wheeled) Transfers: Sit to/from [facility] Sit to Stand: Min assist Stand pivot transfers: Min guard       General transfer comment: cues for technique and management/placement of RW    Balance Overall balance  assessment: Needs assistance Sitting-balance support: Bilateral upper extremity supported;Feet supported Sitting balance-Leahy Scale: Fair     Standing balance support: Bilateral upper extremity supported Standing balance-Leahy Scale: Poor Standing balance comment: bil UE support on RW                           ADL either performed or assessed with clinical judgement   ADL Overall ADL's : Needs assistance/impaired                                             Vision         Perception     Praxis      Pertinent Vitals/Pain Pain Assessment: No/denies pain     Hand Dominance Right   Extremity/Trunk Assessment Upper Extremity Assessment Upper Extremity Assessment: Generalized weakness   Lower Extremity Assessment Lower Extremity Assessment: Generalized weakness   Cervical / Trunk Assessment Cervical / Trunk Assessment: Kyphotic   Communication Communication Communication: HOH   Cognition Arousal/Alertness: Awake/alert Behavior During Therapy: WFL for tasks assessed/performed Overall Cognitive Status: Impaired/Different from baseline Area of Impairment: Orientation;Attention;Following commands;Awareness                   Current Attention Level: Sustained   Following Commands: Follows one step commands inconsistently Safety/Judgement: Decreased awareness of deficits         General Comments  Exercises     Shoulder Instructions      Home Living Family/patient expects to be discharged to:: Private residence Living Arrangements: Alone Available Help at Discharge: Personal care attendant Type of Home: Apartment Home Access: Level entry     Home Layout: One level     Bathroom Shower/Tub: Teacher, early years/pre: Standard     Home Equipment: Tub bench;Bedside commode;Walker - 2 wheels;Walker - 4 wheels;Cane - single point   Additional Comments: aide m-sat 8am-1p, 4-6, family stays at night  periodically per PT report      Prior Functioning/Environment Level of Independence: Needs assistance  Gait / Transfers Assistance Needed: rollator and assist from aide or family as needed ADL's / Homemaking Assistance Needed: A from aide and dtr            OT Problem List: Decreased strength;Decreased activity tolerance;Impaired balance (sitting and/or standing);Decreased cognition;Decreased safety awareness;Decreased knowledge of use of DME or AE;Decreased knowledge of precautions      OT Treatment/Interventions: Self-care/ADL training;Therapeutic exercise;Therapeutic activities;Patient/family education;Balance training    OT Goals(Current goals can be found in the care plan section) Acute Rehab OT Goals Patient Stated Goal: to go to my apartment Time For Goal Achievement: 03/04/21 Potential to Achieve Goals: Fair  OT Frequency: Min 2X/week   Barriers to D/C: Decreased caregiver support          Co-evaluation              AM-PAC OT "6 Clicks" Daily Activity     Outcome Measure Help from another person eating meals?: A Little Help from another person taking care of personal grooming?: A Little Help from another person toileting, which includes using toliet, bedpan, or urinal?: A Lot Help from another person bathing (including washing, rinsing, drying)?: A Lot Help from another person to put on and taking off regular upper body clothing?: A Little Help from another person to put on and taking off regular lower body clothing?: A Lot 6 Click Score: 15   End of Session Equipment Utilized During Treatment: Rolling walker;Gait belt Nurse Communication: Mobility status  Activity Tolerance: Patient tolerated treatment well Patient left: in chair;with call bell/phone within reach;with chair alarm set  OT Visit Diagnosis: Muscle weakness (generalized) (M62.81)                Time: [id number] OT Time Calculation (min): 11 min Charges:  OT General Charges $OT Visit: 1  Visit OT Evaluation $OT Eval Low Complexity: 1 Low  [name] OTR/L acute rehab services Office: [phone]  02/18/2021, 12:25 PM

## 2021-02-18 NOTE — Evaluation (Signed)
Physical Therapy Evaluation Patient Details Name: [name] MRN: [id number] DOB: 11-09-1922 Today's Date: 02/18/2021   History of Present Illness  87 yo admitted 6/6 with CHF exacerbation and AFib with RVR. PMhx: CHF, HTN, HLD, sick sinus syndrome s/p PPM, Afib, CKD, NICM with EF 20%  Clinical Impression  Pt sitting in chair on arrival very HOH and difficult to determine cognition and function from pt. Daughter able to provide home setup and PLOF via phone. Pt is alone from 1300-1600 daily as well as every night. Daughter cannot consistently stay with pt and states pt could move in with her but has been hesitant to give up her apartment. Pt currently requires physical assist to stand and transfer and is not safe to return home without additional assist which daughter is unsure if additional assist can be arranged. Pt with decreased strength, function and cognition who will benefit from acute therapy to maximize mobility and independence.   Spo2 96% on     Follow Up Recommendations Home health PT;Supervision/Assistance - 24 hour;SNF (HHPT if family can arrange increased assistance otherwise SNF)    Equipment Recommendations  None recommended by PT    Recommendations for Other Services       Precautions / Restrictions Precautions Precautions: Fall      Mobility  Bed Mobility               General bed mobility comments: in chair on arrival and end of session    Transfers Overall transfer level: Needs assistance   Transfers: Sit to/from Stand Sit to Stand: Min assist         General transfer comment: cues for hand placement with assist to rise and translate anteriorly x 2 trials  Ambulation/Gait Ambulation/Gait assistance: Min guard Gait Distance (Feet): 150 Feet Assistive device: Rolling walker (2 wheeled) Gait Pattern/deviations: Step-through pattern;Decreased stride length;Trunk flexed   Gait velocity interpretation: 1.31 - 2.62 ft/sec, indicative of limited  community ambulator General Gait Details: cues for direction and position in RW, guarding for safety  Stairs            Wheelchair Mobility    Modified Rankin (Stroke Patients Only)       Balance Overall balance assessment: Needs assistance Sitting-balance support: Bilateral upper extremity supported;Feet supported Sitting balance-Leahy Scale: Fair     Standing balance support: Bilateral upper extremity supported Standing balance-Leahy Scale: Poor Standing balance comment: bil UE support on RW                             Pertinent Vitals/Pain Pain Assessment: No/denies pain    Home Living Family/patient expects to be discharged to:: Private residence Living Arrangements: Alone Available Help at Discharge: Personal care attendant Type of Home: Apartment Home Access: Level entry     Home Layout: One level Home Equipment: Tub bench;Bedside commode;Walker - 2 wheels;Walker - 4 wheels;Cane - single point Additional Comments: aide m-sat 8am-1p, 4-6, family stays at night periodically    Prior Function                 Hand Dominance        Extremity/Trunk Assessment   Upper Extremity Assessment Upper Extremity Assessment: Generalized weakness    Lower Extremity Assessment Lower Extremity Assessment: Generalized weakness    Cervical / Trunk Assessment Cervical / Trunk Assessment: Kyphotic  Communication   Communication: HOH  Cognition Arousal/Alertness: Awake/alert Behavior During Therapy: WFL for tasks assessed/performed Overall Cognitive Status:  Impaired/Different from baseline Area of Impairment: Orientation;Attention;Following commands;Safety/judgement                   Current Attention Level: Sustained   Following Commands: Follows one step commands inconsistently Safety/Judgement: Decreased awareness of deficits            General Comments      Exercises     Assessment/Plan    PT Assessment Patient needs  continued PT services  PT Problem List Decreased mobility;Decreased safety awareness;Decreased activity tolerance;Decreased balance;Decreased cognition;Decreased knowledge of use of DME       PT Treatment Interventions Gait training;Balance training;Functional mobility training;Therapeutic activities;Patient/family education;Cognitive remediation;DME instruction;Therapeutic exercise    PT Goals (Current goals can be found in the Care Plan section)  Acute Rehab PT Goals Patient Stated Goal: return home PT Goal Formulation: With patient/family Time For Goal Achievement: 03/04/21 Potential to Achieve Goals: Fair    Frequency Min 3X/week   Barriers to discharge Decreased caregiver support      Co-evaluation               AM-PAC PT "6 Clicks" Mobility  Outcome Measure Help needed turning from your back to your side while in a flat bed without using bedrails?: A Little Help needed moving from lying on your back to sitting on the side of a flat bed without using bedrails?: A Little Help needed moving to and from a bed to a chair (including a wheelchair)?: A Little Help needed standing up from a chair using your arms (e.g., wheelchair or bedside chair)?: A Little Help needed to walk in hospital room?: A Little Help needed climbing 3-5 steps with a railing? : A Lot 6 Click Score: 17    End of Session Equipment Utilized During Treatment: Gait belt Activity Tolerance: Patient tolerated treatment well Patient left: in chair;with call bell/phone within reach;with chair alarm set Nurse Communication: Mobility status PT Visit Diagnosis: Other abnormalities of gait and mobility (R26.89);Difficulty in walking, not elsewhere classified (R26.2);Muscle weakness (generalized) (M62.81)    Time: 1020-1046 PT Time Calculation (min) (ACUTE ONLY): 26 min   Charges:   PT Evaluation $PT Eval Moderate Complexity: 1 Mod PT Treatments $Gait Training: 8-22 mins        Gardiner Espana P, PT Acute  Rehabilitation Services Pager: [phone] Office: [place] 02/18/2021, 11:01 AM

## 2021-02-19 ENCOUNTER — Inpatient Hospital Stay (HOSPITAL_COMMUNITY): Payer: Medicare Other

## 2021-02-19 DIAGNOSIS — I272 Pulmonary hypertension, unspecified: Secondary | ICD-10-CM

## 2021-02-19 LAB — BASIC METABOLIC PANEL
Anion gap: 8 (ref 5–15)
Anion gap: 11 (ref 5–15)
BUN: 41 mg/dL — ABNORMAL HIGH (ref 8–23)
BUN: 41 mg/dL — ABNORMAL HIGH (ref 8–23)
CO2: 32 mmol/L (ref 22–32)
CO2: 29 mmol/L (ref 22–32)
Calcium: 9.2 mg/dL (ref 8.9–10.3)
Calcium: 9.2 mg/dL (ref 8.9–10.3)
Chloride: 104 mmol/L (ref 98–111)
Chloride: 102 mmol/L (ref 98–111)
Creatinine, Ser: 1.7 mg/dL — ABNORMAL HIGH (ref 0.44–1.00)
Creatinine, Ser: 1.71 mg/dL — ABNORMAL HIGH (ref 0.44–1.00)
GFR, Estimated: 27 mL/min — ABNORMAL LOW (ref >60)
GFR, Estimated: 27 mL/min — ABNORMAL LOW (ref >60)
Glucose, Bld: 115 mg/dL — ABNORMAL HIGH (ref 70–99)
Glucose, Bld: 140 mg/dL — ABNORMAL HIGH (ref 70–99)
Potassium: 3.7 mmol/L (ref 3.5–5.1)
Potassium: 4.3 mmol/L (ref 3.5–5.1)
Sodium: 144 mmol/L (ref 135–145)
Sodium: 142 mmol/L (ref 135–145)

## 2021-02-19 LAB — CBC
HCT: 37.6 % (ref 36.0–46.0)
Hemoglobin: 11.6 g/dL — ABNORMAL LOW (ref 12.0–15.0)
MCH: 32.6 pg (ref 26.0–34.0)
MCHC: 30.9 g/dL (ref 30.0–36.0)
MCV: 105.6 fL — ABNORMAL HIGH (ref 80.0–100.0)
Platelets: 143 10*3/uL — ABNORMAL LOW (ref 150–400)
RBC: 3.56 MIL/uL — ABNORMAL LOW (ref 3.87–5.11)
RDW: 13.7 % (ref 11.5–15.5)
WBC: 11.4 10*3/uL — ABNORMAL HIGH (ref 4.0–10.5)
nRBC: 0 % (ref 0.0–0.2)

## 2021-02-19 LAB — PROTIME-INR
INR: 2.3 — ABNORMAL HIGH (ref 0.8–1.2)
Prothrombin Time: 24.9 seconds — ABNORMAL HIGH (ref 11.4–15.2)

## 2021-02-19 LAB — MAGNESIUM: Magnesium: 2.2 mg/dL (ref 1.7–2.4)

## 2021-02-19 MED ORDER — TORSEMIDE 20 MG PO TABS
40.0000 mg | ORAL_TABLET | Freq: Every day | ORAL | Status: DC
  Administered 2021-02-19 – 2021-02-20 (×2): 40 mg via ORAL
  Filled 2021-02-19 (×2): qty 2

## 2021-02-19 MED ORDER — POTASSIUM CHLORIDE CRYS ER 20 MEQ PO TBCR
40.0000 meq | EXTENDED_RELEASE_TABLET | Freq: Once | ORAL | Status: AC
  Administered 2021-02-19: 40 meq via ORAL
  Filled 2021-02-19: qty 2

## 2021-02-19 MED ORDER — POTASSIUM CHLORIDE CRYS ER 20 MEQ PO TBCR: 40.0000 meq | EXTENDED_RELEASE_TABLET | ORAL | Status: DC

## 2021-02-19 NOTE — Progress Notes (Signed)
Progress Note  Patient Name: [name] Date of Encounter: 02/19/2021  [facility] Cardiologist: Minus Breeding, MD   Subjective   Feeling well.  She reports that her breathing is normal.  Inpatient Medications    Scheduled Meds: . atorvastatin  5 mg Oral QHS  . brimonidine  1 drop Both Eyes Daily  . cephALEXin  250 mg Oral Q12H  . ferrous gluconate  324 mg Oral Q breakfast  . latanoprost  1 drop Both Eyes QHS  . levothyroxine  50 mcg Oral QAC breakfast  . metoprolol succinate  50 mg Oral Daily  . mirtazapine  7.5 mg Oral QHS  . olopatadine  1 drop Both Eyes BID  . pantoprazole  40 mg Oral QHS  . sodium chloride flush  3 mL Intravenous Q12H  . warfarin  2 mg Oral q1600  . Warfarin - Pharmacist Dosing Inpatient   Does not apply q1600   Continuous Infusions: . sodium chloride     PRN Meds: sodium chloride, acetaminophen, senna, sodium chloride flush   Vital Signs    Vitals:   02/18/21 2300 02/19/21 0300 02/19/21 0500 02/19/21 0810  BP: 105/64 120/63  127/71  Pulse: 61 70  (!) 56  Resp: '16 14  19  '$ Temp: 98 F (36.7 C) 97.7 F (36.5 C)  97.6 F (36.4 C)  TempSrc: Oral Oral  Oral  SpO2: 100% 100%  96%  Weight:   45.1 kg     Intake/Output Summary (Last 24 hours) at 02/19/2021 0825 Last data filed at 02/19/2021 0300 Gross per 24 hour  Intake 123 ml  Output 900 ml  Net -777 ml   Last 3 Weights 02/19/2021 02/18/2021 02/17/2021  Weight (lbs) 99 lb 6.8 oz 98 lb 15.8 oz 98 lb 15.8 oz  Weight (kg) 45.1 kg 44.9 kg 44.9 kg      Telemetry    A paced V paced- Personally Reviewed  ECG    n/a - Personally Reviewed  Physical Exam   VS:  BP 127/71 (BP Location: Left Arm)   Pulse (!) 56   Temp 97.6 F (36.4 C) (Oral)   Resp 19   Wt 45.1 kg   SpO2 96%   BMI 20.08 kg/m  , BMI Body mass index is 20.08 kg/m. GENERAL: Frail elderly woman in no acute distress. HEENT: Pupils equal round and reactive, fundi not visualized, oral mucosa unremarkable NECK:  + jugular  venous distention 2 cm above the clavicle sitting upright, waveform within normal limits, carotid upstroke brisk and symmetric, no bruits LUNGS:  Bibasilar crackles HEART:  RRR.  PMI not displaced or sustained,S1 and S2 within normal limits, no S3, no S4, no clicks, no rubs, III/VI systolic murmurs ABD:  Flat, positive bowel sounds normal in frequency in pitch, no bruits, no rebound, no guarding, no midline pulsatile mass, no hepatomegaly, no splenomegaly EXT:  2 plus pulses throughout, no edema, no cyanosis no clubbing SKIN:  No rashes no nodules NEURO:  Cranial nerves II through XII grossly intact, motor grossly intact throughout PSYCH:  Cognitively intact, oriented to person place and time   Labs    High Sensitivity Troponin:   Recent Labs  Lab 02/17/21 1120 02/17/21 1238  TROPONINIHS 13 12      Chemistry Recent Labs  Lab 02/17/21 0937 02/17/21 1120 02/18/21 0056 02/19/21 0016  NA 141  --  144 144  K 3.6  --  3.5 3.7  CL 102  --  105 104  CO2 27  --  29 32  GLUCOSE 125*  --  112* 115*  BUN 37*  --  39* 41*  CREATININE 1.45*  --  1.69* 1.70*  CALCIUM 9.4  --  9.0 9.2  PROT  --  6.8  --   --   ALBUMIN  --  4.0  --   --   AST  --  44*  --   --   ALT  --  67*  --   --   ALKPHOS  --  73  --   --   BILITOT  --  1.7*  --   --   GFRNONAA 33*  --  28* 27*  ANIONGAP 12  --  10 8     Hematology Recent Labs  Lab 02/17/21 0937 02/18/21 0056  WBC 9.7 11.4*  RBC 3.61* 3.55*  HGB 11.6* 11.7*  HCT 37.7 36.7  MCV 104.4* 103.4*  MCH 32.1 33.0  MCHC 30.8 31.9  RDW 13.6 13.6  PLT 156 150    BNP Recent Labs  Lab 02/17/21 1120  BNP 1,897.8*     DDimer No results for input(s): DDIMER in the last 168 hours.   Radiology    DG Chest 2 View  Result Date: 02/17/2021 CLINICAL DATA:  Shortness of breath. EXAM: CHEST - 2 VIEW COMPARISON:  December 29, 2019. FINDINGS: Stable cardiomegaly with bilateral perihilar and basilar opacities concerning for pulmonary edema. Stable  right pleural effusion noted. No pneumothorax is noted. Left-sided pacemaker is unchanged in position. IMPRESSION: Stable cardiomegaly with bilateral perihilar and basilar opacities concerning for pulmonary edema and associated right pleural effusion. Aortic Atherosclerosis (ICD10-I70.0). Electronically Signed   By: Marijo Conception M.D.   On: 02/17/2021 10:29   ECHOCARDIOGRAM COMPLETE  Result Date: 02/17/2021    ECHOCARDIOGRAM REPORT   Patient Name:   [name] Date of Exam: 02/17/2021 Medical Rec #:  [id number]     Height:       59.0 in Accession #:    [id number]    Weight:       103.0 lb Date of Birth:  12/24/1922    BSA:          1.391 m Patient Age:    89 years      BP:           123/80 mmHg Patient Gender: F             HR:           105 bpm. Exam Location:  Inpatient Procedure: 2D Echo, Cardiac Doppler, Color Doppler and Intracardiac            Opacification Agent Indications:    Dyspnea  History:        Patient has prior history of Echocardiogram examinations, most                 recent 01/21/2019. CHF, Pacemaker, Arrythmias:Atrial Fibrillation;                 Risk Factors:Hypertension. CKD.  Sonographer:    Clayton Lefort RDCS (AE) Referring Phys: [place]  1. Left ventricular ejection fraction, by estimation, is <20%. The left ventricle has severely decreased function. The left ventricle demonstrates global hypokinesis. There is moderate left ventricular hypertrophy. Left ventricular diastolic function could not be evaluated.  2. Right ventricular systolic function is moderately reduced. The right ventricular size is moderately enlarged. There is severely elevated pulmonary artery systolic pressure. The estimated right ventricular systolic pressure  is 77.9 mmHg.  3. Left atrial size was severely dilated.  4. Right atrial size was severely dilated. catheter.  5. The mitral valve is abnormal. Moderate to severe mitral valve regurgitation.  6. Tricuspid valve regurgitation is mild  to moderate.  7. The aortic valve is tricuspid. There is mild calcification of the aortic valve. There is mild thickening of the aortic valve. Aortic valve regurgitation is mild. Mild aortic valve sclerosis is present, with no evidence of aortic valve stenosis.  8. The inferior vena cava is normal in size with <50% respiratory variability, suggesting right atrial pressure of 8 mmHg. Comparison(s): Changes from prior study are noted. EF has decreased, severe pulmonary hypertension noted, severe biatrial enlargement. FINDINGS  Left Ventricle: LV thrombus excluded by contrast. Left ventricular ejection fraction, by estimation, is <20%. The left ventricle has severely decreased function. The left ventricle demonstrates global hypokinesis. Definity contrast agent was given IV to  delineate the left ventricular endocardial borders. The left ventricular internal cavity size was normal in size. There is moderate left ventricular hypertrophy. Abnormal (paradoxical) septal motion, consistent with left bundle branch block. Left ventricular diastolic function could not be evaluated due to atrial fibrillation. Left ventricular diastolic function could not be evaluated. Right Ventricle: The right ventricular size is moderately enlarged. Right vetricular wall thickness was not well visualized. Right ventricular systolic function is moderately reduced. There is severely elevated pulmonary artery systolic pressure. The tricuspid regurgitant velocity is 4.18 m/s, and with an assumed right atrial pressure of 8 mmHg, the estimated right ventricular systolic pressure is 99991111 mmHg. Left Atrium: Left atrial size was severely dilated. Right Atrium: Right atrial size was severely dilated. Catheter. Pericardium: There is no evidence of pericardial effusion. Mitral Valve: The mitral valve is abnormal. Moderate to severe mitral valve regurgitation. Tricuspid Valve: The tricuspid valve is normal in structure. Tricuspid valve regurgitation is  mild to moderate. Aortic Valve: The aortic valve is tricuspid. There is mild calcification of the aortic valve. There is mild thickening of the aortic valve. Aortic valve regurgitation is mild. Aortic regurgitation PHT measures 669 msec. Mild aortic valve sclerosis is present, with no evidence of aortic valve stenosis. Aortic valve mean gradient measures 1.8 mmHg. Aortic valve peak gradient measures 3.0 mmHg. Aortic valve area, by VTI measures 2.44 cm. Pulmonic Valve: The pulmonic valve was grossly normal. Pulmonic valve regurgitation is mild. No evidence of pulmonic stenosis. Aorta: The aortic root, ascending aorta and aortic arch are all structurally normal, with no evidence of dilitation or obstruction. Venous: The inferior vena cava is normal in size with less than 50% respiratory variability, suggesting right atrial pressure of 8 mmHg. IAS/Shunts: The atrial septum is grossly normal. Additional Comments: There is a small pleural effusion in the left lateral region.  LEFT VENTRICLE PLAX 2D LVIDd:         4.90 cm LVIDs:         4.50 cm LV PW:         1.70 cm LV IVS:        1.70 cm LVOT diam:     2.10 cm LV SV:         28 LV SV Index:   20 LVOT Area:     3.46 cm  RIGHT VENTRICLE            IVC RV Basal diam:  4.30 cm    IVC diam: 1.60 cm RV Mid diam:    2.20 cm RV S prime:  6.38 cm/s TAPSE (M-mode): 1.3 cm LEFT ATRIUM              Index        RIGHT ATRIUM           Index LA diam:        5.80 cm  4.17 cm/m   RA Area:     27.80 cm LA Vol (A2C):   171.0 ml 122.92 ml/m RA Volume:   102.00 ml 73.32 ml/m LA Vol (A4C):   182.0 ml 130.83 ml/m LA Biplane Vol: 179.0 ml 128.67 ml/m  AORTIC VALVE AV Area (Vmax):    2.37 cm AV Area (Vmean):   2.17 cm AV Area (VTI):     2.44 cm AV Vmax:           86.78 cm/s AV Vmean:          61.240 cm/s AV VTI:            0.117 m AV Peak Grad:      3.0 mmHg AV Mean Grad:      1.8 mmHg LVOT Vmax:         59.44 cm/s LVOT Vmean:        38.380 cm/s LVOT VTI:          0.082 m LVOT/AV  VTI ratio: 0.70 AI PHT:            669 msec  AORTA Ao Root diam: 3.00 cm Ao Asc diam:  3.30 cm MR Peak grad:    117.5 mmHg  TRICUSPID VALVE MR Mean grad:    60.0 mmHg   TR Peak grad:   69.9 mmHg MR Vmax:         542.00 cm/s TR Vmax:        418.00 cm/s MR Vmean:        342.0 cm/s MR PISA:         2.26 cm    SHUNTS MR PISA Eff ROA: 17 mm      Systemic VTI:  0.08 m MR PISA Radius:  0.60 cm     Systemic Diam: 2.10 cm Buford Dresser MD Electronically signed by Buford Dresser MD Signature Date/Time: 02/17/2021/6:48:56 PM    Final     Cardiac Studies   Echo 02/17/21: 1. Left ventricular ejection fraction, by estimation, is <20%. The left  ventricle has severely decreased function. The left ventricle demonstrates  global hypokinesis. There is moderate left ventricular hypertrophy. Left  ventricular diastolic function  could not be evaluated.  2. Right ventricular systolic function is moderately reduced. The right  ventricular size is moderately enlarged. There is severely elevated  pulmonary artery systolic pressure. The estimated right ventricular  systolic pressure is 99991111 mmHg.  3. Left atrial size was severely dilated.  4. Right atrial size was severely dilated. catheter.  5. The mitral valve is abnormal. Moderate to severe mitral valve  regurgitation.  6. Tricuspid valve regurgitation is mild to moderate.  7. The aortic valve is tricuspid. There is mild calcification of the  aortic valve. There is mild thickening of the aortic valve. Aortic valve  regurgitation is mild. Mild aortic valve sclerosis is present, with no  evidence of aortic valve stenosis.  8. The inferior vena cava is normal in size with <50% respiratory  variability, suggesting right atrial pressure of 8 mmHg.   Patient Profile     Ms. Erlich is a 65F with chronic systolic and diastolic heart failure, hypertension, hyperlipidemia, sick sinus syndrome status  post pacemaker, persistent atrial fibrillation,  and CKD 3 admitted with acute on chronic systolic and diastolic heart failure and atrial fibrillation with RVR.   Assessment & Plan    #Acute on chronic systolic diastolic heart failure: # RV failure: LVEF <20% this admission.  It is been 20 to 25% at baseline.  BNP was 1897.  She has been getting diuresed with IV Lasix but is only net -1.7 L.  She still has crackles on exam and her JVD is elevated.  This is confounded by the fact that she has severe pulmonary hypertension, but I would not expect her to have the pulmonary edema if she was adequately diuresed.  We will repeat her chest x-ray.  I will switch her to torsemide to see if she has a better urine output to that.  Repeat BMP this afternoon to assess renal function prior to further diuresis.   She is not on any ACE inhibitor/ARB/Entresto due to CKD.  Blood pressure was too low for hydralazine and nitrates.  Her pulmonary pressures are severely elevated.  I suspect this is due to left-sided heart failure.  PE is very unlikely given that she has been therapeutic on warfarin.  Will defer further work-up given her age.  No plans for rhythm control strategy as she had refractory atrial arrhythmias despite being on amiodarone and this was likely the cause of her hypothyroidism.  #Atrial fibrillation/flutter: She converted to a paced/V paced on 6/6.  I suspect that much of her heart failure symptoms were due to being in atrial for with RVR.  She was started on metoprolol and is tolerating it well.  Consolidate to succinate today.  # Hypothyroidism: TSH well-controlled on levothyroxine.       For questions or updates, please contact [place] Please consult [email or web address] for contact info under        Signed, Skeet Latch, MD  02/19/2021, 8:25 AM

## 2021-02-19 NOTE — Plan of Care (Signed)
  Problem: Education: Goal: Knowledge of General Education information will improve Description: Including pain rating scale, medication(s)/side effects and non-pharmacologic comfort measures Outcome: Progressing   Problem: Health Behavior/Discharge Planning: Goal: Ability to manage health-related needs will improve Outcome: Progressing   Problem: Clinical Measurements: Goal: Ability to maintain clinical measurements within normal limits will improve Outcome: Progressing   Problem: Clinical Measurements: Goal: Diagnostic test results will improve Outcome: Progressing   Problem: Activity: Goal: Risk for activity intolerance will decrease Outcome: Progressing   Problem: Nutrition: Goal: Adequate nutrition will be maintained Outcome: Progressing   Problem: Pain Managment: Goal: General experience of comfort will improve Outcome: Progressing   Problem: Safety: Goal: Ability to remain free from injury will improve Outcome: Progressing

## 2021-02-19 NOTE — Progress Notes (Signed)
Family Medicine Teaching Service Daily Progress Note Intern Pager: [phone]  Patient name: [name] Medical record number: [id number] Date of birth: Jan 14, 1923 Age: 87 y.o. Gender: female  Primary Care Provider: Chriss Czar, MD Consultants: Cardiology Code Status: Full  Pt Overview and Major Events to Date:  6/6 - Admitted  Assessment and Plan: [name] is a 87 y.o. female admitted for heart failure exacerbation. PMH is significant for CHF, atrial fibrillation/tachybradycarida syndrome, HTN, glaucoma, hypothyroidism, dyslipidemia  Acute on chronic HFrEF Is&Os not accurately recorded as patient refused purewick after initiation of diuresis and had episodes of incontinence. Physical exam still with bibasilar crackles and JVD, patient with [place] not in place on examination and oxygen saturations around 95%.  - Cards consulted, appreciate recs - Daily weights - Strict Is&Os - PT/OT  - Follow-up repeat CXR - Per cardiology, will consider torsemide  Paroxysmal atrial fibrillation with RVR  Atrial flutter Tachybrady syndrome s/p PPM - Cardiology consulted, appreciate recs - Cardiac telemetry - Now on metoprolol succinate - Coumadin per pharmacy  Right foot cellulitis vs gout Still has some warmth to the midfoot region, which had improved yesterday. Will recheck CBC. Reassured that erythema does not appear to be spreading and patient reports improvement in pain.  - Continue Keflex '250mg'$  BID (day 3/5) - Monitor for fever - Follow-up CBC  Hypothyroidism - Continue home levothyroxine 68mg daily  Macrocytic anemia Hgb stable at 11.7 on 6/7. - Continue to monitor  CKD Stage IIIb Creatinine 1.69>1.7, GFR 27. Cr baseline around 1.5-1.7, elevated from admission but still near baseline. S/p several doses of IV lasix. - Continue to monitor with BMP  HTN BP range since admission 99-177/71-133, most recently 122/76 - Continue metoprolol '25mg'$  BID  Dyslipidemia -  Continue home atorvastatin   FEN/GI: Heart healthy PPx: on Coumadin for anticoagulation   Status is: Inpatient  Remains inpatient appropriate because:Inpatient level of care appropriate due to severity of illness   Dispo: The patient is from: Home              Anticipated d/c is to: Home              Patient currently is not medically stable to d/c.   Difficult to place patient No    Subjective:  Patient reports that she is feeling pretty good today, she has no complaints at this time.  Reports she has no chest pain and physical breathing has gotten back to baseline.  Objective: Temp:  [97.6 F (36.4 C)-98.2 F (36.8 C)] 97.6 F (36.4 C) (06/08 0810) Pulse Rate:  [56-70] 56 (06/08 0810) Resp:  [14-19] 19 (06/08 0810) BP: (105-127)/(63-76) 127/71 (06/08 0810) SpO2:  [96 %-100 %] 96 % (06/08 0810) Weight:  [45.1 kg] 45.1 kg (06/08 0500) Physical Exam: General: NAD, sitting up in recliner, elderly thin female Cardiovascular: RRR, no lower extremity swelling Respiratory: Bibasilar crackles, breathing comfortably, [place] on forehead (was replaced to nose but oxygen turned off as patient satting in the 90s) Abdomen: Soft, nontender, nondistended Extremities: Moving all extremities equally and appropriately, right foot warm compared to prior exam  Laboratory: Recent Labs  Lab 02/17/21 0937 02/18/21 0056  WBC 9.7 11.4*  HGB 11.6* 11.7*  HCT 37.7 36.7  PLT 156 150   Recent Labs  Lab 02/17/21 0937 02/17/21 1120 02/18/21 0056 02/19/21 0016  NA 141  --  144 144  K 3.6  --  3.5 3.7  CL 102  --  105 104  CO2 27  --  29 32  BUN 37*  --  39* 41*  CREATININE 1.45*  --  1.69* 1.70*  CALCIUM 9.4  --  9.0 9.2  PROT  --  6.8  --   --   BILITOT  --  1.7*  --   --   ALKPHOS  --  73  --   --   ALT  --  67*  --   --   AST  --  44*  --   --   GLUCOSE 125*  --  112* 115*      Imaging/Diagnostic Tests: No results found.   [name], DO 02/19/2021, 9:33 AM PGY-1, [place] Intern pager: [phone], text pages welcome

## 2021-02-19 NOTE — Progress Notes (Signed)
FPTS Interim Progress Note  Patient sleeping and resting comfortably in bed tonight.  Remains on 1 L oxygen n/c.  Rounded with primary RN.  No concerns voiced.  No orders required.  Appreciated nightly round.  Today's Vitals   02/18/21 2000 02/18/21 2152 02/18/21 2300 02/19/21 0300  BP:  117/76 105/64 120/63  Pulse:  63 61 70  Resp:   16 14  Temp:   98 F (36.7 C) 97.7 F (36.5 C)  TempSrc:   Oral Oral  SpO2:   100% 100%  Weight:      PainSc: 0-No pain  0-No pain 0-No pain   Continue to wean oxygen   Carollee Leitz, MD 02/19/2021, 3:27 AM PGY-2, [place] Medicine Service pager [phone]

## 2021-02-20 ENCOUNTER — Other Ambulatory Visit (HOSPITAL_COMMUNITY): Payer: Self-pay

## 2021-02-20 DIAGNOSIS — N1832 Chronic kidney disease, stage 3b: Secondary | ICD-10-CM

## 2021-02-20 DIAGNOSIS — M109 Gout, unspecified: Secondary | ICD-10-CM

## 2021-02-20 LAB — CBC
HCT: 38.4 % (ref 36.0–46.0)
Hemoglobin: 12 g/dL (ref 12.0–15.0)
MCH: 32.3 pg (ref 26.0–34.0)
MCHC: 31.3 g/dL (ref 30.0–36.0)
MCV: 103.5 fL — ABNORMAL HIGH (ref 80.0–100.0)
Platelets: 150 10*3/uL (ref 150–400)
RBC: 3.71 MIL/uL — ABNORMAL LOW (ref 3.87–5.11)
RDW: 13.7 % (ref 11.5–15.5)
WBC: 11 10*3/uL — ABNORMAL HIGH (ref 4.0–10.5)
nRBC: 0 % (ref 0.0–0.2)

## 2021-02-20 LAB — BASIC METABOLIC PANEL
Anion gap: 8 (ref 5–15)
BUN: 41 mg/dL — ABNORMAL HIGH (ref 8–23)
CO2: 32 mmol/L (ref 22–32)
Calcium: 9.2 mg/dL (ref 8.9–10.3)
Chloride: 104 mmol/L (ref 98–111)
Creatinine, Ser: 1.75 mg/dL — ABNORMAL HIGH (ref 0.44–1.00)
GFR, Estimated: 27 mL/min — ABNORMAL LOW (ref >60)
Glucose, Bld: 124 mg/dL — ABNORMAL HIGH (ref 70–99)
Potassium: 3.5 mmol/L (ref 3.5–5.1)
Sodium: 144 mmol/L (ref 135–145)

## 2021-02-20 LAB — PROTIME-INR
INR: 2.7 — ABNORMAL HIGH (ref 0.8–1.2)
Prothrombin Time: 28.9 seconds — ABNORMAL HIGH (ref 11.4–15.2)

## 2021-02-20 LAB — URIC ACID: Uric Acid, Serum: 10.3 mg/dL — ABNORMAL HIGH (ref 2.5–7.1)

## 2021-02-20 LAB — TROPONIN I (HIGH SENSITIVITY)
Troponin I (High Sensitivity): 15 ng/L (ref <18)
Troponin I (High Sensitivity): 14 ng/L (ref <18)

## 2021-02-20 MED ORDER — POTASSIUM CHLORIDE 20 MEQ PO PACK: 20.0000 meq | PACK | Freq: Every day | ORAL | Status: DC

## 2021-02-20 MED ORDER — TORSEMIDE 20 MG PO TABS
40.0000 mg | ORAL_TABLET | Freq: Every day | ORAL | 1 refills | Status: DC
  Filled 2021-02-20: qty 60, 30d supply, fill #0

## 2021-02-20 MED ORDER — EMPAGLIFLOZIN 10 MG PO TABS
10.0000 mg | ORAL_TABLET | Freq: Every day | ORAL | 2 refills | Status: DC
  Filled 2021-02-20: qty 30, 30d supply, fill #0

## 2021-02-20 MED ORDER — PREDNISONE 10 MG PO TABS
30.0000 mg | ORAL_TABLET | Freq: Every day | ORAL | 0 refills | Status: AC
  Filled 2021-02-20: qty 15, 5d supply, fill #0

## 2021-02-20 MED ORDER — PANTOPRAZOLE SODIUM 40 MG PO TBEC
40.0000 mg | DELAYED_RELEASE_TABLET | Freq: Every day | ORAL | 0 refills | Status: DC
  Filled 2021-02-20: qty 30, 30d supply, fill #0

## 2021-02-20 MED ORDER — POTASSIUM CHLORIDE CRYS ER 20 MEQ PO TBCR
40.0000 meq | EXTENDED_RELEASE_TABLET | ORAL | Status: AC
Start: 2021-02-20 — End: 2021-02-20
  Administered 2021-02-20 (×2): 40 meq via ORAL
  Filled 2021-02-20 (×2): qty 2

## 2021-02-20 MED ORDER — WARFARIN 0.5 MG HALF TABLET
0.5000 mg | ORAL_TABLET | Freq: Once | ORAL | Status: AC
  Administered 2021-02-20: 0.5 mg via ORAL
  Filled 2021-02-20: qty 1

## 2021-02-20 MED ORDER — CEPHALEXIN 250 MG PO CAPS
250.0000 mg | ORAL_CAPSULE | Freq: Two times a day (BID) | ORAL | 0 refills | Status: AC
  Filled 2021-02-20: qty 4, 2d supply, fill #0

## 2021-02-20 MED ORDER — METOPROLOL SUCCINATE ER 50 MG PO TB24
50.0000 mg | ORAL_TABLET | Freq: Every day | ORAL | 1 refills | Status: DC
  Filled 2021-02-20: qty 30, 30d supply, fill #0

## 2021-02-20 MED ORDER — PREDNISONE 20 MG PO TABS
30.0000 mg | ORAL_TABLET | Freq: Every day | ORAL | Status: DC
  Administered 2021-02-20: 30 mg via ORAL
  Filled 2021-02-20: qty 1

## 2021-02-20 MED ORDER — EMPAGLIFLOZIN 10 MG PO TABS
10.0000 mg | ORAL_TABLET | Freq: Every day | ORAL | Status: DC
  Administered 2021-02-20: 10 mg via ORAL
  Filled 2021-02-20: qty 1

## 2021-02-20 MED ORDER — WARFARIN SODIUM 2 MG PO TABS: ORAL_TABLET | ORAL | 0 refills | Status: AC

## 2021-02-20 MED ORDER — ATORVASTATIN CALCIUM 10 MG PO TABS
5.0000 mg | ORAL_TABLET | Freq: Every day | ORAL | 0 refills | Status: AC
  Filled 2021-02-20: qty 15, 30d supply, fill #0

## 2021-02-20 NOTE — Progress Notes (Signed)
Physical Therapy Treatment Patient Details Name: [name] MRN: [id number] DOB: 1923/02/27 Today's Date: 02/20/2021    History of Present Illness 87 yo admitted 6/6 with CHF exacerbation and AFib with RVR. PMhx: CHF, HTN, HLD, sick sinus syndrome s/p PPM, Afib, CKD, NICM with EF 20%.    PT Comments    Pt received in chair, agreeable to therapy session and with good participation and tolerance for transfer and gait progression with RW support. Pt motivated to progress LE strength/endurance and VSS aside from O2, unable to obtain a good signal on either hand or ear for completion of ambulatory saturation test despite warming up fingers in hot water prior to mobility. RN notified pt may need to try forehead sensor to achieve O2 signal? Pt continues to benefit from PT services to progress toward functional mobility goals. Discharge recommendations below remain appropriate.  Follow Up Recommendations  Home health PT;Supervision/Assistance - 24 hour;SNF;Other (comment) (HHPT if family can arrange increased assist for home otherwise SNF.)     Equipment Recommendations  None recommended by PT    Recommendations for Other Services       Precautions / Restrictions Precautions Precautions: Fall Precaution Comments: VERY HOH Restrictions Weight Bearing Restrictions: No    Mobility  Bed Mobility               General bed mobility comments: in chair on arrival and end of session per pt request    Transfers Overall transfer level: Needs assistance Equipment used: Rolling walker (2 wheeled) Transfers: Sit to/from [facility] Sit to Stand: Min assist         General transfer comment: cues for technique and management/placement of RW  Ambulation/Gait Ambulation/Gait assistance: Min guard Gait Distance (Feet): 150 Feet Assistive device: Rolling walker (2 wheeled) Gait Pattern/deviations: Step-through pattern;Decreased stride length;Trunk flexed;Drifts  right/left   Gait velocity interpretation: <1.31 ft/sec, indicative of household ambulator General Gait Details: cues for direction and position in RW, guarding for safety, kyphotic posture, no shortness of breath but poor pleth signal on 0-2L O2 [place] unable to get reading >70% but no acute s/sx distress; poor pulse ox reading after trial seated in chair as well.   Stairs             Wheelchair Mobility    Modified Rankin (Stroke Patients Only)       Balance                                            Cognition Arousal/Alertness: Awake/alert Behavior During Therapy: WFL for tasks assessed/performed Overall Cognitive Status: Impaired/Different from baseline Area of Impairment: Orientation;Attention;Following commands;Awareness                   Current Attention Level: Sustained   Following Commands: Follows one step commands consistently (HoH has difficulty hearing instructions but good command following when she does) Safety/Judgement: Decreased awareness of deficits Awareness: Intellectual   General Comments: pt participatory and motivated to progress mobility, very HoH.      Exercises Other Exercises Other Exercises: seated BLE AROM: LAQ, ankle pumps x10 reps ea    General Comments General comments (skin integrity, edema, etc.): HR 60-65 bpm resting up to 69 with exertion; BP 112/61 seated pre-mobility and BP 113/64 post-exertion; tried to do ambulatory O2 sats test but unable to get good pleth signal on ear or either hand, even  after having pt wash hands in hot water for 2 minutes. RR 22 rpm with exertion      Pertinent Vitals/Pain Pain Assessment: No/denies pain    Home Living                      Prior Function            PT Goals (current goals can now be found in the care plan section) Acute Rehab PT Goals Patient Stated Goal: to go to my apartment PT Goal Formulation: With patient/family Time For Goal Achievement:  03/04/21 Potential to Achieve Goals: Fair Progress towards PT goals: Progressing toward goals    Frequency    Min 3X/week      PT Plan Current plan remains appropriate    Co-evaluation              AM-PAC PT "6 Clicks" Mobility   Outcome Measure  Help needed turning from your back to your side while in a flat bed without using bedrails?: A Little Help needed moving from lying on your back to sitting on the side of a flat bed without using bedrails?: A Little Help needed moving to and from a bed to a chair (including a wheelchair)?: A Little Help needed standing up from a chair using your arms (e.g., wheelchair or bedside chair)?: A Little Help needed to walk in hospital room?: A Little Help needed climbing 3-5 steps with a railing? : A Lot 6 Click Score: 17    End of Session Equipment Utilized During Treatment: Gait belt;Oxygen (0-2L O2 [place], poor pleth signal) Activity Tolerance: Patient tolerated treatment well Patient left: in chair;with call bell/phone within reach;with chair alarm set Nurse Communication: Mobility status;Other (comment) (poor pulse ox signal, may try with forehead sensor for next walk, geomat cushion ordered) PT Visit Diagnosis: Other abnormalities of gait and mobility (R26.89);Difficulty in walking, not elsewhere classified (R26.2);Muscle weakness (generalized) (M62.81)     Time: 1220-1250 PT Time Calculation (min) (ACUTE ONLY): 30 min  Charges:  $Gait Training: 8-22 mins $Therapeutic Activity: 8-22 mins                     Brylea Pita P., PTA Acute Rehabilitation Services Pager: [phone] Office: [place] 02/20/2021, 2:26 PM

## 2021-02-20 NOTE — TOC Transition Note (Addendum)
Transition of Care [facility]) - CM/SW Discharge Note   Patient Details  Name: [name] MRN: [id number] Date of Birth: Oct 14, 1922  [facility] CM/SW Contact:  Zenon Mayo, RN Phone Number: 02/20/2021, 1:32 PM   Clinical Narrative:    Patient for poss dc today, she is set up with [facility] in [place].  NCM informed her daughter,  daughter states she has worked out where she will have 24 hr care at home.  Her aide will start tomorrow and daughter will be staying with her a couple of nights.  Patient will need home oxygen.  NCM informed MD to put order in.  Daughter states she is ok with [employer] supplying the oxygen.  NCM made referral to [facility] with [employer]. NCM notified [facility] of patient dc today, soc will be tomorrow.   Final next level of care: [place] Barriers to Discharge: No Barriers Identified   Patient Goals and CMS Choice Patient states their goals for this hospitalization and ongoing recovery are:: return home CMS Medicare.gov Compare Post Acute Care list provided to:: Patient Represenative (must comment) Choice offered to / list presented to : Adult Children  Discharge Placement                       Discharge Plan and Services                  DME Agency: NA oxygen       HH Arranged: PT, OT HH Agency: [place] Date [place]: 02/19/21 Time [place]: [id number] Representative spoke with at [place]: liberty rep  Social Determinants of Health ([place]) Interventions     Readmission Risk Interventions No flowsheet data found.

## 2021-02-20 NOTE — Progress Notes (Addendum)
ANTICOAGULATION CONSULT NOTE - Initial Consult  Pharmacy Consult for warfarin Indication: atrial fibrillation  No Known Allergies  Patient Measurements: Weight: 44.5 kg (98 lb 1.7 oz) Heparin Dosing Weight: n/a   Vital Signs: Temp: 98.3 F (36.8 C) (06/09 0723) Temp Source: Oral (06/09 0723) BP: 110/86 (06/09 0723) Pulse Rate: 86 (06/09 0308)  Labs: Recent Labs    02/17/21 1120 02/17/21 1238 02/17/21 1739 02/18/21 0056 02/19/21 0016 02/19/21 0953 02/19/21 1457 02/20/21 0103 02/20/21 0627  HGB  --   --   --  11.7*  --  11.6*  --   --  12.0  HCT  --   --   --  36.7  --  37.6  --   --  38.4  PLT  --   --   --  150  --  143*  --   --  150  LABPROT  --   --    < > 22.5* 24.9*  --   --  28.9*  --   INR  --   --    < > 2.0* 2.3*  --   --  2.7*  --   CREATININE  --   --   --  1.69* 1.70*  --  1.71* 1.75*  --   TROPONINIHS 13 12  --   --   --   --   --   --   --    < > = values in this interval not displayed.     Estimated Creatinine Clearance: 13.1 mL/min (A) (by C-G formula based on SCr of 1.75 mg/dL (H)).   Medical History: Past Medical History:  Diagnosis Date   Acute on chronic diastolic (congestive) heart failure ([place]) 01/22/2019   Acute on chronic systolic CHF (congestive heart failure) ([place]) 03/04/2018   Anemia    Arthritis    "knees especially"   Bradycardia    Cardiomyopathy    presumed ischemic in the past with an EF of 35%. The most recent EF in 03-2008, however, was 68 -60%   CHF 12/28/2007   Qualifier: Diagnosis of  By: Laney Potash, [name]     Chronic anticoagulation 12/15/2015   Chronic renal insufficiency    Dementia ([place])    mild   Dyslipidemia    Encounter for therapeutic drug monitoring 12/25/2013   Glaucoma    Hypertension    Malleolar fracture    Pacemaker infection ([place]) 08/18/2012   Pacemaker- [employer] 12/25/2011   DOI 1/13    PAF (paroxysmal atrial fibrillation) ([place]) 04/19/2017   Paroxysmal atrial fibrillation (H[facility])    Sinus node dysfunction  ([place]) 12/28/2007   Qualifier: Diagnosis of  By: Laney Potash, [name]      Tachycardia-bradycardia syndrome ([place]) 09/19/2011    Medications:  Medications Prior to Admission  Medication Sig Dispense Refill Last Dose   acetaminophen (TYLENOL) 500 MG tablet Take 325 mg by mouth every 6 (six) hours as needed for headache (pain).    Past Week at Unknown time   atorvastatin (LIPITOR) 10 MG tablet TAKE 1/2 TABLET(5 MG) BY MOUTH AT BEDTIME (Patient taking differently: Take 5 mg by mouth at bedtime.) 45 tablet 3 02/16/2021 at Unknown time   brimonidine (ALPHAGAN) 0.2 % ophthalmic solution Place 1 drop into both eyes daily.   02/16/2021 at Unknown time   Famotidine (PEPCID AC MAXIMUM STRENGTH) 20 MG CHEW Chew 1 tablet by mouth at bedtime.    02/16/2021 at Unknown time   IRON, FERROUS GLUCONATE, PO Take 1 tablet  by mouth daily.   02/16/2021 at Unknown time   levothyroxine (SYNTHROID) 50 MCG tablet Take 50 mcg by mouth daily before breakfast.   02/17/2021 at Unknown time   Melatonin 3 MG CAPS Take 3 mg by mouth at bedtime.    02/16/2021 at Unknown time   mirtazapine (REMERON) 7.5 MG tablet Take 7.5 mg by mouth at bedtime.   02/16/2021 at Unknown time   Multiple Vitamin (MULTIVITAMIN WITH MINERALS) TABS tablet Take 1 tablet by mouth daily.   02/16/2021 at Unknown time   Olopatadine HCl 0.2 % SOLN Place 1 drop into both eyes daily.    02/16/2021 at Unknown time   potassium chloride SA (KLOR-CON) 20 MEQ tablet Take 1 tablet (20 mEq total) by mouth 2 (two) times daily. 180 tablet 3 02/16/2021 at Unknown time   senna (SENOKOT) 8.6 MG TABS tablet Take 1 tablet by mouth at bedtime as needed for mild constipation.   Past Week at Unknown time   Travoprost, BAK Free, (TRAVATAN) 0.004 % SOLN ophthalmic solution Place 1 drop into both eyes at bedtime.   02/16/2021 at Unknown time   warfarin (COUMADIN) 2 MG tablet TAKE 1 TABLET BY MOUTH DAILY AS DIRECTED BY COUMADIN CLINIC (Patient taking differently: Take 2 mg by mouth See admin instructions.  TAKE 1 TABLET BY MOUTH DAILY AS DIRECTED BY COUMADIN CLINIC) 90 tablet 1 02/16/2021 at Unknown time   furosemide (LASIX) 80 MG tablet TAKE 1 TABLET(80 MG) BY MOUTH DAILY 90 tablet 3      Assessment: 89 YOF with h/o Afib on warfarin at home. Home warfarin dose: 2 mg every day. Patient has been receiving warfarin home regimen while inpatient x 3 doses so far. Patient was started on cephalexin for ?cellulitis of the foot and torsemide for diuresis which both can increase the anticoagulant effects of warfarin and INR has trended up. No s/sx of bleeding observed or reported by patient this AM.   INR 2.7 this AM  CBC stable: Hgb 12, Plt 150   Goal of Therapy:  INR 2-3 Monitor platelets by anticoagulation protocol: Yes   Plan:  -Warfarin 0.'5mg'$  x1 today -added stop date of 6/10 PM to cephalexin  -Monitor daily INR and watch for s/s of bleeding   Discharge Planning -take warfarin '1mg'$  (0.5 tablet) x1 tomorrow 6/10 then return to home dose of warfarin '2mg'$  PO QD thereafter  Wilson Singer, PharmD PGY1 Pharmacy Resident 02/20/2021 8:46 AM

## 2021-02-20 NOTE — Progress Notes (Signed)
FPTS Interim Progress Note  Patient sleeping and resting comfortably.  Rounded with primary RN who reports patient currently on 1 L oxygen N/C for comfort.  No worsening shortness of breath.  Output 1L since given Torsemide.  No concerns voiced.  No orders required.  Appreciated nightly round.  Today's Vitals   02/19/21 1343 02/19/21 1629 02/19/21 1949 02/19/21 2328  BP: 108/65 95/62 121/67 (!) 113/53  Pulse: (!) 59 60 (!) 58 71  Resp: (!) '22 20 19 19  '$ Temp: 97.6 F (36.4 C) 98.2 F (36.8 C) 97.7 F (36.5 C) 98.2 F (36.8 C)  TempSrc: Axillary Axillary Oral Oral  SpO2: 95% 95% 98% 100%  Weight:      PainSc: 0-No pain 0-No pain 0-No pain Asleep    Ambulate with pulse ox in am Monitor electrolytes and replete as needed  Carollee Leitz, MD 02/20/2021, 3:06 AM PGY-2, [place] [facility] pager [phone]

## 2021-02-20 NOTE — Progress Notes (Signed)
Mobility Specialist - Progress Note   02/20/21 1450  Mobility  Activity Ambulated in hall  Level of Assistance Contact guard assist, steadying assist  Assistive Device Front wheel walker  Distance Ambulated (ft) 100 ft  Mobility Ambulated with assistance in hallway  Mobility Response Tolerated well  Mobility performed by Mobility specialist  $Mobility charge 1 Mobility   Requested by RN to check pulse ox while ambulating. Was unable to get signal on pulse ox on either hand, ear lobe, or on her forehead. Pt was asx throughout on RA and was able to maintain conversation w/o any SOB. Pt to recliner after walk, call bell at side and daughter in room.   Pricilla Handler Mobility Specialist Mobility Specialist Phone: [phone]

## 2021-02-20 NOTE — Progress Notes (Signed)
SATURATION QUALIFICATIONS: (This note is used to comply with regulatory documentation for home oxygen)  Patient Saturations on Room Air at Rest = 98%  Patient Saturations on Room Air while Ambulating =70%  Patient Saturations on 2 Liters of oxygen while Ambulating = 98%  Please briefly explain why patient needs home oxygen: Needs Oxygen when ambulating

## 2021-02-20 NOTE — Discharge Instructions (Signed)
You were admitted due to heart failure exacerbation and were diuresed while in the hospital. Your home medication of Lasix is being discontinued and you will be taking Torsemide now.     Your foot pain and redness was possibly a skin infection or gout, we treated you for both and you are being discharged with medications to treat both. If you continue to have episodes of this pain, please follow-up with your primary care doctor.

## 2021-02-20 NOTE — Plan of Care (Signed)

## 2021-02-20 NOTE — Discharge Summary (Addendum)
[facility] Discharge Summary  Patient name: [name] Medical record number: [id number] Date of birth: Jun 22, 1923 Age: 87 y.o. Gender: female Date of Admission: 02/17/2021  Date of Discharge: 02/20/2021  Admitting Physician: Kathleen Rio, MD  Primary Care Provider: Chriss Czar, MD Consultants: Cardiology  Indication for Hospitalization: Shortness of breath  Discharge Diagnoses/Problem List:  Acute exacerbation of chronic HFrEF Paroxysmal atrial fibrillation with RVR Atrial flutter Tachybradycardia syndrome s/p PPM Right foot cellulitis Concern for gout Hypothyroidism Macrocytic anemia CKD stage IIIb HTN Dyslipidemia  Disposition: Home  Discharge Condition: Stable, improved  Discharge Exam:  General: NAD, sitting up recliner next to bed Cardiovascular: RRR, no lower extremity swelling Respiratory: minimal bibasilar crackles present (improved from prior exam), breathing comfortably on room air as nasal cannula was around patient's neck Extremities: right great toe MTP erythematous and tender to palpation, medial midfoot with some erythema but minimally tender compared to prior exams. Pictures present in chart.  Brief Hospital Course:  [name] is a 87 y.o. female presenting with increasing shortness of breath and orthopnea . PMH is significant for CHF, atrial fibrillation/tachybradycarida syndrome, HTN, glaucoma, hypothyroidism, dyslipidemia.  Acute on chronic HFrEF Patient admitted with shortness of breath and orthopnea in the setting of decreased Lasix dosing outpatient. In the ED, patient was given IV Lasix with good output. Echocardiogram was obtained and showed EF <20% with LV global hypokinesis, moderate LVH, severely dilated atria, severely elevated pulmonary artery systolic pressure. Cardiology was consulted and patient was further diuresed with IV Lasix '80mg'$  BID for 2 doses. Due to unimpressive output and continued  pulmonary findings on exam, CXR was repeated and showed no change and patient was switched to Torsemide.  Paroxysmal atrial fibrillation with RVR  Atrial flutter Tachybrady syndrome s/p PPM Patient admitted with atrial fibrillation with alternating tachycardia and bradycardia. Cardiology was consulted and patient was started on Metoprolol succinate '50mg'$  daily.   Right foot cellulitis vs gout Erythematous right midfoot and bunion on admission, no obvious wound but with extension into the midfoot and minimally elevated WBC. Patient was started on renally dose Keflex with improvement initially. Due to concern for gout, uric acid level was checked and was elevated at 9.7. Over hospitalization the erythema in the right MTP did recur with significant pain. Patient was started on prednisone '30mg'$  initially, should consider colchicine renally dosed for the future. Patient currently on coumadin and allopurinol increases warfarin effectiveness and would require increased monitoring.   All other chronic conditions were stable during hospitalization.   Issues for follow-up: Monitor for resolution of erythematous right foot. If recurrence, consider evaluating for gout Possible gout diagnosis, patient was started on prednisone for a 5 day course. Monitor for recurrence and consider renally dose colchicine.  Recommend lipid panel and colchicine  outpatient. C Cardiology rec'd Jardiance 10 mg however GFR<30 therefore not initiated at discharge.     Significant Procedures: None  Significant Labs and Imaging:  Recent Labs  Lab 02/18/21 0056 02/19/21 0953 02/20/21 0627  WBC 11.4* 11.4* 11.0*  HGB 11.7* 11.6* 12.0  HCT 36.7 37.6 38.4  PLT 150 143* 150   Recent Labs  Lab 02/17/21 0937 02/17/21 1120 02/18/21 0056 02/19/21 0016 02/19/21 1457 02/20/21 0103  NA 141  --  144 144 142 144  K 3.6  --  3.5 3.7 4.3 3.5  CL 102  --  105 104 102 104  CO2 27  --  29 32 29 32  GLUCOSE 125*  --  112* 115*  140* 124*  BUN 37*  --  39* 41* 41* 41*  CREATININE 1.45*  --  1.69* 1.70* 1.71* 1.75*  CALCIUM 9.4  --  9.0 9.2 9.2 9.2  MG  --   --  2.3 2.2  --   --   ALKPHOS  --  73  --   --   --   --   AST  --  44*  --   --   --   --   ALT  --  67*  --   --   --   --   ALBUMIN  --  4.0  --   --   --   --     DG Chest 2 View  Result Date: 02/17/2021 CLINICAL DATA:  Shortness of breath. EXAM: CHEST - 2 VIEW COMPARISON:  December 29, 2019. FINDINGS: Stable cardiomegaly with bilateral perihilar and basilar opacities concerning for pulmonary edema. Stable right pleural effusion noted. No pneumothorax is noted. Left-sided pacemaker is unchanged in position. IMPRESSION: Stable cardiomegaly with bilateral perihilar and basilar opacities concerning for pulmonary edema and associated right pleural effusion. Aortic Atherosclerosis (ICD10-I70.0). Electronically Signed   By: [name] M.D.   On: 02/17/2021 10:29   ECHOCARDIOGRAM COMPLETE  Result Date: 02/17/2021    ECHOCARDIOGRAM REPORT   Patient Name:   [name] Date of Exam: 02/17/2021 Medical Rec #:  [id number]     Height:       59.0 in Accession #:    [id number]    Weight:       103.0 lb Date of Birth:  1923/07/18    BSA:          1.391 m Patient Age:    89 years      BP:           123/80 mmHg Patient Gender: F             HR:           105 bpm. Exam Location:  Inpatient Procedure: 2D Echo, Cardiac Doppler, Color Doppler and Intracardiac            Opacification Agent Indications:    Dyspnea  History:        Patient has prior history of Echocardiogram examinations, most                 recent 01/21/2019. CHF, Pacemaker, Arrythmias:Atrial Fibrillation;                 Risk Factors:Hypertension. CKD.  Sonographer:    [name] RDCS (AE) Referring Phys: [name]  1. Left ventricular ejection fraction, by estimation, is <20%. The left ventricle has severely decreased function. The left ventricle demonstrates global hypokinesis. There is  moderate left ventricular hypertrophy. Left ventricular diastolic function could not be evaluated.  2. Right ventricular systolic function is moderately reduced. The right ventricular size is moderately enlarged. There is severely elevated pulmonary artery systolic pressure. The estimated right ventricular systolic pressure is 99991111 mmHg.  3. Left atrial size was severely dilated.  4. Right atrial size was severely dilated. catheter.  5. The mitral valve is abnormal. Moderate to severe mitral valve regurgitation.  6. Tricuspid valve regurgitation is mild to moderate.  7. The aortic valve is tricuspid. There is mild calcification of the aortic valve. There is mild thickening of the aortic valve. Aortic valve regurgitation is mild. Mild aortic valve sclerosis is present, with no evidence of aortic valve  stenosis.  8. The inferior vena cava is normal in size with <50% respiratory variability, suggesting right atrial pressure of 8 mmHg. Comparison(s): Changes from prior study are noted. EF has decreased, severe pulmonary hypertension noted, severe biatrial enlargement. FINDINGS  Left Ventricle: LV thrombus excluded by contrast. Left ventricular ejection fraction, by estimation, is <20%. The left ventricle has severely decreased function. The left ventricle demonstrates global hypokinesis. Definity contrast agent was given IV to  delineate the left ventricular endocardial borders. The left ventricular internal cavity size was normal in size. There is moderate left ventricular hypertrophy. Abnormal (paradoxical) septal motion, consistent with left bundle branch block. Left ventricular diastolic function could not be evaluated due to atrial fibrillation. Left ventricular diastolic function could not be evaluated. Right Ventricle: The right ventricular size is moderately enlarged. Right vetricular wall thickness was not well visualized. Right ventricular systolic function is moderately reduced. There is severely elevated  pulmonary artery systolic pressure. The tricuspid regurgitant velocity is 4.18 m/s, and with an assumed right atrial pressure of 8 mmHg, the estimated right ventricular systolic pressure is 99991111 mmHg. Left Atrium: Left atrial size was severely dilated. Right Atrium: Right atrial size was severely dilated. Catheter. Pericardium: There is no evidence of pericardial effusion. Mitral Valve: The mitral valve is abnormal. Moderate to severe mitral valve regurgitation. Tricuspid Valve: The tricuspid valve is normal in structure. Tricuspid valve regurgitation is mild to moderate. Aortic Valve: The aortic valve is tricuspid. There is mild calcification of the aortic valve. There is mild thickening of the aortic valve. Aortic valve regurgitation is mild. Aortic regurgitation PHT measures 669 msec. Mild aortic valve sclerosis is present, with no evidence of aortic valve stenosis. Aortic valve mean gradient measures 1.8 mmHg. Aortic valve peak gradient measures 3.0 mmHg. Aortic valve area, by VTI measures 2.44 cm. Pulmonic Valve: The pulmonic valve was grossly normal. Pulmonic valve regurgitation is mild. No evidence of pulmonic stenosis. Aorta: The aortic root, ascending aorta and aortic arch are all structurally normal, with no evidence of dilitation or obstruction. Venous: The inferior vena cava is normal in size with less than 50% respiratory variability, suggesting right atrial pressure of 8 mmHg. IAS/Shunts: The atrial septum is grossly normal. Additional Comments: There is a small pleural effusion in the left lateral region.  LEFT VENTRICLE PLAX 2D LVIDd:         4.90 cm LVIDs:         4.50 cm LV PW:         1.70 cm LV IVS:        1.70 cm LVOT diam:     2.10 cm LV SV:         28 LV SV Index:   20 LVOT Area:     3.46 cm  RIGHT VENTRICLE            IVC RV Basal diam:  4.30 cm    IVC diam: 1.60 cm RV Mid diam:    2.20 cm RV S prime:     6.38 cm/s TAPSE (M-mode): 1.3 cm LEFT ATRIUM              Index        RIGHT ATRIUM            Index LA diam:        5.80 cm  4.17 cm/m   RA Area:     27.80 cm LA Vol (A2C):   171.0 ml 122.92 ml/m RA Volume:   102.00 ml 73.Dodge  ml/m LA Vol (A4C):   182.0 ml 130.83 ml/m LA Biplane Vol: 179.0 ml 128.67 ml/m  AORTIC VALVE AV Area (Vmax):    2.37 cm AV Area (Vmean):   2.17 cm AV Area (VTI):     2.44 cm AV Vmax:           86.78 cm/s AV Vmean:          61.240 cm/s AV VTI:            0.117 m AV Peak Grad:      3.0 mmHg AV Mean Grad:      1.8 mmHg LVOT Vmax:         59.44 cm/s LVOT Vmean:        38.380 cm/s LVOT VTI:          0.082 m LVOT/AV VTI ratio: 0.70 AI PHT:            669 msec  AORTA Ao Root diam: 3.00 cm Ao Asc diam:  3.30 cm MR Peak grad:    117.5 mmHg  TRICUSPID VALVE MR Mean grad:    60.0 mmHg   TR Peak grad:   69.9 mmHg MR Vmax:         542.00 cm/s TR Vmax:        418.00 cm/s MR Vmean:        342.0 cm/s MR PISA:         2.26 cm    SHUNTS MR PISA Eff ROA: 17 mm      Systemic VTI:  0.08 m MR PISA Radius:  0.60 cm     Systemic Diam: 2.10 cm Buford Dresser MD Electronically signed by Buford Dresser MD Signature Date/Time: 02/17/2021/6:48:56 PM    Final      Results/Tests Pending at Time of Discharge: None  Discharge Medications:  Allergies as of 02/20/2021   No Known Allergies      Medication List     STOP taking these medications    furosemide 80 MG tablet Commonly known as: LASIX   Pepcid AC Maximum Strength 20 MG Chew Generic drug: Famotidine       TAKE these medications    acetaminophen 500 MG tablet Commonly known as: TYLENOL Take 325 mg by mouth every 6 (six) hours as needed for headache (pain).   atorvastatin 10 MG tablet Commonly known as: LIPITOR Take 0.5 tablets (5 mg total) by mouth at bedtime. What changed: See the new instructions.   brimonidine 0.2 % ophthalmic solution Commonly known as: ALPHAGAN Place 1 drop into both eyes daily.   cephALEXin 250 MG capsule Commonly known as: KEFLEX Take 1 capsule (250 mg total) by mouth  every 12 (twelve) hours for 2 days.   IRON (FERROUS GLUCONATE) PO Take 1 tablet by mouth daily.   levothyroxine 50 MCG tablet Commonly known as: SYNTHROID Take 50 mcg by mouth daily before breakfast.   Melatonin 3 MG Caps Take 3 mg by mouth at bedtime.   metoprolol succinate 50 MG 24 hr tablet Commonly known as: TOPROL-XL Take 1 tablet (50 mg total) by mouth daily. Take with or immediately following a meal.   mirtazapine 7.5 MG tablet Commonly known as: REMERON Take 7.5 mg by mouth at bedtime.   multivitamin with minerals Tabs tablet Take 1 tablet by mouth daily.   Olopatadine HCl 0.2 % Soln Place 1 drop into both eyes daily.   pantoprazole 40 MG tablet Commonly known as: PROTONIX Take 1 tablet (40 mg total) by mouth  at bedtime.   potassium chloride SA 20 MEQ tablet Commonly known as: KLOR-CON Take 1 tablet (20 mEq total) by mouth 2 (two) times daily.   predniSONE 10 MG tablet Commonly known as: DELTASONE Take 3 tablets (30 mg total) by mouth daily with breakfast for 5 days.   senna 8.6 MG Tabs tablet Commonly known as: SENOKOT Take 1 tablet by mouth at bedtime as needed for mild constipation.   torsemide 20 MG tablet Commonly known as: DEMADEX Take 2 tablets (40 mg total) by mouth daily.   Travoprost (BAK Free) 0.004 % Soln ophthalmic solution Commonly known as: TRAVATAN Place 1 drop into both eyes at bedtime.   warfarin 2 MG tablet Commonly known as: COUMADIN Take as directed. If you are unsure how to take this medication, talk to your nurse or doctor. Original instructions: Take 0.5 tablets (1 mg total) by mouth daily for 2 days, THEN 1 tablet (2 mg total) daily for 28 days. Start taking on: February 20, 2021 What changed: See the new instructions.        Discharge Instructions: Please refer to Patient Instructions section of EMR for full details.  Patient was counseled important signs and symptoms that should prompt return to medical care, changes in  medications, dietary instructions, activity restrictions, and follow up appointments.   Follow-Up Appointments:  Follow-up [facility] up.   Specialty: Home Health Services Why: HHPT,HHOT Contact information: [address] [place] [address] [phone]         Minus Breeding, MD Follow up on 03/13/2021.   Specialty: Cardiology Why: at 1:40 PM for your post hosptial follow up appointment wit cardiology Contact information: [address] [place] [address] [place], [facility] Follow up.   Why: oxygen Contact information: [place] [address] [phone]                 [name], DO 02/21/2021, 6:52 AM PGY-1, [facility]

## 2021-02-20 NOTE — Progress Notes (Addendum)
Progress Note  Patient Name: [name] Date of Encounter: 02/20/2021  [facility] Cardiologist: Minus Breeding, MD   Subjective   Feeling well.  Her breathing is at baseline.  Her only complaint is pain in her left foot.  Inpatient Medications    Scheduled Meds:  atorvastatin  5 mg Oral QHS   brimonidine  1 drop Both Eyes Daily   cephALEXin  250 mg Oral Q12H   ferrous gluconate  324 mg Oral Q breakfast   latanoprost  1 drop Both Eyes QHS   levothyroxine  50 mcg Oral QAC breakfast   metoprolol succinate  50 mg Oral Daily   mirtazapine  7.5 mg Oral QHS   olopatadine  1 drop Both Eyes BID   pantoprazole  40 mg Oral QHS   sodium chloride flush  3 mL Intravenous Q12H   torsemide  40 mg Oral Daily   warfarin  2 mg Oral q1600   Warfarin - Pharmacist Dosing Inpatient   Does not apply q1600   Continuous Infusions:  sodium chloride     PRN Meds: sodium chloride, acetaminophen, senna, sodium chloride flush   Vital Signs    Vitals:   02/19/21 2328 02/20/21 0308 02/20/21 0435 02/20/21 0723  BP: (!) 113/53 117/69  110/86  Pulse: 71 86    Resp: 19 19  (!) 25  Temp: 98.2 F (36.8 C) 97.8 F (36.6 C)  98.3 F (36.8 C)  TempSrc: Oral Oral  Oral  SpO2: 100% 93%    Weight:   44.5 kg     Intake/Output Summary (Last 24 hours) at 02/20/2021 0830 Last data filed at 02/20/2021 0630 Gross per 24 hour  Intake 443 ml  Output 1050 ml  Net -607 ml   Last 3 Weights 02/20/2021 02/19/2021 02/18/2021  Weight (lbs) 98 lb 1.7 oz 99 lb 6.8 oz 98 lb 15.8 oz  Weight (kg) 44.5 kg 45.1 kg 44.9 kg      Telemetry    A paced V paced.  PVCs.  6 beats NSVT.- Personally Reviewed  ECG    n/a - Personally Reviewed  Physical Exam   VS:  BP 110/86 (BP Location: Left Arm)   Pulse 86   Temp 98.3 F (36.8 C) (Oral)   Resp (!) 25   Wt 44.5 kg   SpO2 93%   BMI 19.81 kg/m  , BMI Body mass index is 19.81 kg/m. GENERAL: Frail elderly woman in no acute distress. HEENT: Pupils equal round and  reactive, fundi not visualized, oral mucosa unremarkable NECK:  + jugular venous distention 2 cm above the clavicle sitting upright, waveform within normal limits, carotid upstroke brisk and symmetric, no bruits LUNGS:  Bibasilar crackles HEART:  RRR.  PMI not displaced or sustained,S1 and S2 within normal limits, no S3, no S4, no clicks, no rubs, III/VI systolic murmurs ABD:  Flat, positive bowel sounds normal in frequency in pitch, no bruits, no rebound, no guarding, no midline pulsatile mass, no hepatomegaly, no splenomegaly EXT:  2 plus pulses throughout, no edema, no cyanosis no clubbing.  Erythema and mild edema at L foot around hallux valgus SKIN:  No rashes no nodules NEURO:  Cranial nerves II through XII grossly intact, motor grossly intact throughout [facility]:  Cognitively intact, oriented to person place and time   Labs    High Sensitivity Troponin:   Recent Labs  Lab 02/17/21 1120 02/17/21 1238  TROPONINIHS 13 12      Chemistry Recent Labs  Lab 02/17/21  1120 02/18/21 0056 02/19/21 0016 02/19/21 1457 02/20/21 0103  NA  --    < > 144 142 144  K  --    < > 3.7 4.3 3.5  CL  --    < > 104 102 104  CO2  --    < > 32 29 32  GLUCOSE  --    < > 115* 140* 124*  BUN  --    < > 41* 41* 41*  CREATININE  --    < > 1.70* 1.71* 1.75*  CALCIUM  --    < > 9.2 9.2 9.2  PROT 6.8  --   --   --   --   ALBUMIN 4.0  --   --   --   --   AST 44*  --   --   --   --   ALT 67*  --   --   --   --   ALKPHOS 73  --   --   --   --   BILITOT 1.7*  --   --   --   --   GFRNONAA  --    < > 27* 27* 27*  ANIONGAP  --    < > '8 11 8   '$ < > = values in this interval not displayed.     Hematology Recent Labs  Lab 02/18/21 0056 02/19/21 0953 02/20/21 0627  WBC 11.4* 11.4* 11.0*  RBC 3.55* 3.56* 3.71*  HGB 11.7* 11.6* 12.0  HCT 36.7 37.6 38.4  MCV 103.4* 105.6* 103.5*  MCH 33.0 32.6 32.3  MCHC 31.9 30.9 31.3  RDW 13.6 13.7 13.7  PLT 150 143* 150    BNP Recent Labs  Lab 02/17/21 1120   BNP 1,897.8*     DDimer No results for input(s): DDIMER in the last 168 hours.   Radiology    DG CHEST PORT 1 VIEW  Result Date: 02/19/2021 CLINICAL DATA:  Shortness of breath EXAM: PORTABLE CHEST 1 VIEW COMPARISON:  02/17/2021 FINDINGS: Left pacer remains in place, unchanged. Cardiomegaly, vascular congestion and bilateral airspace disease, right greater than left. Stable layering right pleural effusion. No acute bony abnormality. IMPRESSION: Cardiomegaly. Stable bilateral airspace disease with right effusion. Findings likely reflect edema/CHF. No real change. Electronically Signed   By: Rolm Baptise M.D.   On: 02/19/2021 10:50     Cardiac Studies   Echo 02/17/21: 1. Left ventricular ejection fraction, by estimation, is <20%. The left  ventricle has severely decreased function. The left ventricle demonstrates  global hypokinesis. There is moderate left ventricular hypertrophy. Left  ventricular diastolic function  could not be evaluated.   2. Right ventricular systolic function is moderately reduced. The right  ventricular size is moderately enlarged. There is severely elevated  pulmonary artery systolic pressure. The estimated right ventricular  systolic pressure is 99991111 mmHg.   3. Left atrial size was severely dilated.   4. Right atrial size was severely dilated. catheter.   5. The mitral valve is abnormal. Moderate to severe mitral valve  regurgitation.   6. Tricuspid valve regurgitation is mild to moderate.   7. The aortic valve is tricuspid. There is mild calcification of the  aortic valve. There is mild thickening of the aortic valve. Aortic valve  regurgitation is mild. Mild aortic valve sclerosis is present, with no  evidence of aortic valve stenosis.   8. The inferior vena cava is normal in size with <50% respiratory  variability, suggesting right atrial  pressure of 8 mmHg.   Patient Profile     [name] is a 54F with chronic systolic and diastolic heart failure,  hypertension, hyperlipidemia, sick sinus syndrome status post pacemaker, persistent atrial fibrillation, and CKD 3 admitted with acute on chronic systolic and diastolic heart failure and atrial fibrillation with RVR.   Assessment & Plan    #Acute on chronic systolic diastolic heart failure: # RV failure: LVEF <20% this admission.  It is been 20 to 25% at baseline.  BNP was 1897.  She was diuresed with IV Lasix and transitioned to torsemide yesterday.  She continue to have a negative fluid balance.  Would keep her on torsemide at discharge rather than her home furosemide.  Continue metoprolol succinate which was started this admission.  She is not on any ACE inhibitor/ARB/Entresto due to CKD.  Blood pressure was too low for hydralazine and nitrates.  Her pulmonary pressures are severely elevated.  I suspect this is due to left-sided heart failure.  PE is very unlikely given that she has been therapeutic on warfarin.  Will defer further work-up given her age.  No plans for rhythm control strategy as she had refractory atrial arrhythmias despite being on amiodarone and this was likely the cause of her hypothyroidism.  #Atrial fibrillation/flutter: She converted to a paced/V paced on 6/6.  I suspect that much of her heart failure symptoms were due to being in atrial for with RVR.  She was started on metoprolol and is tolerating it well.  Continue warfarin.  # Hypothyroidism: TSH well-controlled on levothyroxine.  # L foot pain:  Likely 2/2 irritation of hallux valgus.  She notes many family members had gout.  Will check uric acid level.  ?cellulitis    [facility] will sign off.   Medication Recommendations:  continue torsemide Other recommendations (labs, testing, etc):  assess foot pain Follow up as an outpatient:  we will arrange   For questions or updates, please contact [place] Please consult [email or web address] for contact info under        Signed, Skeet Latch, MD  02/20/2021,  8:30 AM

## 2021-02-20 NOTE — Progress Notes (Signed)
Family Medicine Teaching Service Daily Progress Note Intern Pager: [phone]  Patient name: [name] Medical record number: [id number] Date of birth: [birth year] Age: 87 y.o. Gender: female  Primary Care Provider: Chriss Czar, MD Consultants: Cardiology Code Status: Full  Pt Overview and Major Events to Date:  6/6 - Admitted  Assessment and Plan: [name] is a 87 y.o. female admitted for heart failure exacerbation. PMH is significant for CHF, atrial fibrillation/tachybradycarida syndrome, HTN, glaucoma, hypothyroidism, dyslipidemia   Concern for atypical chest pain Patient reports "heart spasms" for the last several days, with overall not feeling well today.  Telemetry monitor did show concern for possible ST elevations.  Given patient's symptoms we will get EKG and troponin. - Follow-up EKG - Follow-up troponin - Cardiology already consulted on other chronic issues  Right foot cellulitis vs gout Patient continued on Keflex but, today more erythematous the patient reports specific pain in her right toe that limits her from walking.  At this time concern is could be more of a gout picture, patient does not have a creatinine clearance that would support the use of colchicine or allopurinol and we will start with prednisone to monitor for improvement - Continue Keflex '250mg'$  BID (Day 4/5) - Prednisone 30 mg daily - Monitor fever curve  Acute on chronic heart failure Total output in the last 24h 1038m with net of -607. CXR completed 6/8 showed no real change with stable bilateral airspace disease with right effusion. Patient switched to torsemide by cardiology on 6/8 with some improvement in output. - Cardiology following, appreciate recs - Encourage incentive spirometry - PT/OT following - Continue torsemide 40 mg daily  Elevated Creatinine Cr 1.71>1.75, patient is close to baseline and it is not yet an AKI, but elevated from admission of 1.45. Currently being diuresed,  will continue to monitor - Monitor with BMP   Paroxysmal atrial fibrillation with RVR  Atrial flutter Tachybrady syndrome s/p PPM - Cardiology following, appreciate recs - Cardiac telemetry - Continue metoprolol succinate - Coumadin per pharmacy   All other chronic conditions stable with home medications continued as appropriate  FEN/GI: Heart healthy PPx: On coumadin for anticoagulation   Status is: Inpatient  Remains inpatient appropriate because:Ongoing diagnostic testing needed not appropriate for outpatient work up  Dispo: The patient is from: Home              Anticipated d/c is to: Home              Patient currently is not medically stable to d/c.   Difficult to place patient No    Subjective:  Patient reports that she is having "heart spasms" that is started over the weekend and are getting a little bit worse, she has had in the past.  She states that she does not feel well today but cannot specify why exactly.  She does report that her right big toe hurts a lot and is stopping her from walking.  Objective: Temp:  [97.6 F (36.4 C)-98.3 F (36.8 C)] 98.3 F (36.8 C) (06/09 0723) Pulse Rate:  [58-86] 86 (06/09 0308) Resp:  [19-25] 25 (06/09 0723) BP: (95-121)/(53-86) 110/86 (06/09 0723) SpO2:  [93 %-100 %] 93 % (06/09 0308) Weight:  [44.5 kg] 44.5 kg (06/09 0435) Physical Exam: General: NAD, sitting up in recliner next to bed, nasal cannula around her neck, hard of hearing Cardiovascular: RRR, no lower extremity swelling Respiratory: Possible minimal bibasilar crackles but much improved from prior exam, breathing comfortably on room air as  nasal cannula is around her neck. Abdomen: Soft, nontender, nondistended Extremities: Right great toe MTP erythematous and tender to palpation, medial midfoot does have some erythema to it but minimally tender compared to the toe.  Picture below    Laboratory: Recent Labs  Lab 02/18/21 0056 02/19/21 0953 02/20/21 0627   WBC 11.4* 11.4* 11.0*  HGB 11.7* 11.6* 12.0  HCT 36.7 37.6 38.4  PLT 150 143* 150   Recent Labs  Lab 02/17/21 1120 02/18/21 0056 02/19/21 0016 02/19/21 1457 02/20/21 0103  NA  --    < > 144 142 144  K  --    < > 3.7 4.3 3.5  CL  --    < > 104 102 104  CO2  --    < > 32 29 32  BUN  --    < > 41* 41* 41*  CREATININE  --    < > 1.70* 1.71* 1.75*  CALCIUM  --    < > 9.2 9.2 9.2  PROT 6.8  --   --   --   --   BILITOT 1.7*  --   --   --   --   ALKPHOS 73  --   --   --   --   ALT 67*  --   --   --   --   AST 44*  --   --   --   --   GLUCOSE  --    < > 115* 140* 124*   < > = values in this interval not displayed.     Imaging/Diagnostic Tests: DG CHEST PORT 1 VIEW  Result Date: 02/19/2021 CLINICAL DATA:  Shortness of breath EXAM: PORTABLE CHEST 1 VIEW COMPARISON:  02/17/2021 FINDINGS: Left pacer remains in place, unchanged. Cardiomegaly, vascular congestion and bilateral airspace disease, right greater than left. Stable layering right pleural effusion. No acute bony abnormality. IMPRESSION: Cardiomegaly. Stable bilateral airspace disease with right effusion. Findings likely reflect edema/CHF. No real change. Electronically Signed   By: Rolm Baptise M.D.   On: 02/19/2021 10:50     [name], DO 02/20/2021, 8:49 AM PGY-1, [facility] Intern pager: [phone], text pages welcome

## 2021-02-21 ENCOUNTER — Other Ambulatory Visit (HOSPITAL_COMMUNITY): Payer: Self-pay

## 2021-02-28 ENCOUNTER — Telehealth: Payer: Self-pay

## 2021-02-28 ENCOUNTER — Telehealth: Payer: Self-pay | Admitting: Cardiology

## 2021-02-28 NOTE — Telephone Encounter (Signed)
This is fine. Pt was scheduled for INR check on 6/20 in office, this has been canceled. Provided Candace with [facility] a verbal order to check INR next week during their home care visit on either 6/20 or 6/21. Also provided them with direct # to Coumadin clinic to call us with INR while they are in pt's home.

## 2021-02-28 NOTE — Telephone Encounter (Signed)
I let the patient daughter know it is okay for her to send the transmission today.

## 2021-02-28 NOTE — Telephone Encounter (Signed)
Candace from [facility] states the patient's daughter requests they do the patient's INR check. She would like to know if this is okay. Phone: [phone]

## 2021-03-04 ENCOUNTER — Ambulatory Visit (INDEPENDENT_AMBULATORY_CARE_PROVIDER_SITE_OTHER): Payer: Medicare Other

## 2021-03-04 DIAGNOSIS — I4891 Unspecified atrial fibrillation: Secondary | ICD-10-CM

## 2021-03-04 DIAGNOSIS — Z5181 Encounter for therapeutic drug level monitoring: Secondary | ICD-10-CM | POA: Diagnosis not present

## 2021-03-04 LAB — POCT INR: INR: 2.4 (ref 2.0–3.0)

## 2021-03-04 NOTE — Patient Instructions (Signed)
Description   Called spoke with [name], pt's daughter advised to have pt continue on same dosage of Warfarin '2mg'$  (1 tablet) daily. Will have [facility] recheck INR in 2 weeks   [place] [phone], spoke with Candice gave verbal order to recheck INR in 2 weeks on 03/18/21. Call our office if you have any concerns [phone].

## 2021-03-10 ENCOUNTER — Ambulatory Visit (INDEPENDENT_AMBULATORY_CARE_PROVIDER_SITE_OTHER): Payer: Medicare Other

## 2021-03-10 DIAGNOSIS — I495 Sick sinus syndrome: Secondary | ICD-10-CM | POA: Diagnosis not present

## 2021-03-11 LAB — CUP PACEART REMOTE DEVICE CHECK
Battery Impedance: 1411 Ohm
Battery Remaining Longevity: 45 mo
Battery Voltage: 2.76 V
Brady Statistic AP VP Percent: 48 %
Brady Statistic AP VS Percent: 11 %
Brady Statistic AS VP Percent: 7 %
Brady Statistic AS VS Percent: 33 %
Date Time Interrogation Session: 20220626145528
Implantable Lead Implant Date: 20130103
Implantable Lead Implant Date: 20130103
Implantable Lead Location: 753859
Implantable Lead Location: 753860
Implantable Lead Model: 1944
Implantable Lead Model: 1948
Implantable Pulse Generator Implant Date: 20130103
Lead Channel Impedance Value: 392 Ohm
Lead Channel Impedance Value: 659 Ohm
Lead Channel Pacing Threshold Amplitude: 0.25 V
Lead Channel Pacing Threshold Amplitude: 0.625 V
Lead Channel Pacing Threshold Pulse Width: 0.4 ms
Lead Channel Pacing Threshold Pulse Width: 0.4 ms
Lead Channel Setting Pacing Amplitude: 2 V
Lead Channel Setting Pacing Amplitude: 2.5 V
Lead Channel Setting Pacing Pulse Width: 0.4 ms
Lead Channel Setting Sensing Sensitivity: 5.6 mV

## 2021-03-12 NOTE — Progress Notes (Deleted)
Cardiology Office Note   Date:  03/12/2021   ID:  [name], DOB April 25, 1923, MRN [id number]  PCP:  Chriss Czar, MD  Cardiologist:   Minus Breeding, MD   No chief complaint on file.      History of Present Illness: [name] is a 87 y.o. female who presents for followup of had tachybradycardia syndrome.  She was in the hospital in April 2021 with acute on chronic systolic and diastolic HF.  Since I last saw her she was in the hospital with acute on chronic diastolic HF.    I reviewed these records for this visit.  ***   ***  she saw Dr. Caryl Comes. After careful discussion with the patient it was decided that she would stop the amiodarone.    Since she was last seen she has had no new cardiovascular problems.  She gets around with a rolling walker.  She is not having any new palpitations, presyncope or syncope.  Dr. Caryl Comes also reduced her Lasix and she is doing well just with 80 mg in the morning and not the 40 mg in the p.m.  Past Medical History:  Diagnosis Date   Acute on chronic diastolic (congestive) heart failure ([place]) 01/22/2019   Acute on chronic systolic CHF (congestive heart failure) ([place]) 03/04/2018   Anemia    Arthritis    "knees especially"   Bradycardia    Cardiomyopathy    presumed ischemic in the past with an EF of 35%. The most recent EF in 03-2008, however, was 21 -60%   CHF 12/28/2007   Qualifier: Diagnosis of  By: Laney Potash, [name]     Chronic anticoagulation 12/15/2015   Chronic renal insufficiency    Dementia ([place])    mild   Dyslipidemia    Encounter for therapeutic drug monitoring 12/25/2013   Glaucoma    Hypertension    Malleolar fracture    Pacemaker infection ([place]) 08/18/2012   Pacemaker- [employer] 12/25/2011   DOI 1/13    PAF (paroxysmal atrial fibrillation) ([place]) 04/19/2017   Paroxysmal atrial fibrillation ([facility])    Sinus node dysfunction ([place]) 12/28/2007   Qualifier: Diagnosis of  By: Laney Potash, [name]      Tachycardia-bradycardia  syndrome ([place]) 09/19/2011    Past Surgical History:  Procedure Laterality Date   ABDOMINAL HYSTERECTOMY     CARDIOVERSION  07/2011   EYE SURGERY  2002   "blood clot behind eye"   FRACTURE SURGERY  ~ 2007   RLE   HEMORRHOID SURGERY     INSERT / REPLACE / REMOVE PACEMAKER  09/17/11   initial placement   INSERT / REPLACE / REMOVE PACEMAKER  09/18/11   PACEMAKER REVISION N/A 09/18/2011   Procedure: PACEMAKER REVISION;  Surgeon: Thompson Grayer, MD;  Location: [facility] CATH LAB;  Service: Cardiovascular;  Laterality: N/A;   PERMANENT PACEMAKER INSERTION N/A 09/17/2011   Procedure: PERMANENT PACEMAKER INSERTION;  Surgeon: Deboraha Sprang, MD;  Location: [facility] C[facility] LAB;  Service: Cardiovascular;  Laterality: N/A;   TONSILLECTOMY AND ADENOIDECTOMY       Current Outpatient Medications  Medication Sig Dispense Refill   acetaminophen (TYLENOL) 500 MG tablet Take 325 mg by mouth every 6 (six) hours as needed for headache (pain).      atorvastatin (LIPITOR) 10 MG tablet Take 0.5 tablets (5 mg total) by mouth at bedtime. 30 tablet 0   brimonidine (ALPHAGAN) 0.2 % ophthalmic solution Place 1 drop into both eyes daily.     IRON, FERROUS GLUCONATE, PO  Take 1 tablet by mouth daily.     levothyroxine (SYNTHROID) 50 MCG tablet Take 50 mcg by mouth daily before breakfast.     Melatonin 3 MG CAPS Take 3 mg by mouth at bedtime.      metoprolol succinate (TOPROL-XL) 50 MG 24 hr tablet Take 1 tablet (50 mg total) by mouth daily. Take with or immediately following a meal. 30 tablet 1   mirtazapine (REMERON) 7.5 MG tablet Take 7.5 mg by mouth at bedtime.     Multiple Vitamin (MULTIVITAMIN WITH MINERALS) TABS tablet Take 1 tablet by mouth daily.     Olopatadine HCl 0.2 % SOLN Place 1 drop into both eyes daily.      pantoprazole (PROTONIX) 40 MG tablet Take 1 tablet (40 mg total) by mouth at bedtime. 30 tablet 0   potassium chloride SA (KLOR-CON) 20 MEQ tablet Take 1 tablet (20 mEq total) by mouth 2 (two) times daily. 180 tablet 3    senna (SENOKOT) 8.6 MG TABS tablet Take 1 tablet by mouth at bedtime as needed for mild constipation.     torsemide (DEMADEX) 20 MG tablet Take 2 tablets (40 mg total) by mouth daily. 60 tablet 1   Travoprost, BAK Free, (TRAVATAN) 0.004 % SOLN ophthalmic solution Place 1 drop into both eyes at bedtime.     warfarin (COUMADIN) 2 MG tablet Take 0.5 tablets (1 mg total) by mouth daily for 2 days, THEN 1 tablet (2 mg total) daily for 28 days. 29 tablet 0   No current facility-administered medications for this visit.    Allergies:   Patient has no known allergies.    ROS:  Please see the history of present illness.   Otherwise, review of systems are positive for ***.   All other systems are reviewed and negative.    PHYSICAL EXAM: VS:  There were no vitals taken for this visit. , BMI There is no height or weight on file to calculate BMI. GENERAL:  Well appearing NECK:  No jugular venous distention, waveform within normal limits, carotid upstroke brisk and symmetric, no bruits, no thyromegaly LUNGS:  Clear to auscultation bilaterally CHEST:  Unremarkable HEART:  PMI not displaced or sustained,S1 and S2 within normal limits, no S3, no S4, no clicks, no rubs, *** murmurs ABD:  Flat, positive bowel sounds normal in frequency in pitch, no bruits, no rebound, no guarding, no midline pulsatile mass, no hepatomegaly, no splenomegaly EXT:  2 plus pulses throughout, no edema, no cyanosis no clubbing     ***GEN:  No distress NECK:  No jugular venous distention at 90 degrees, waveform within normal limits, carotid upstroke brisk and symmetric, no bruits, no thyromegaly LYMPHATICS:  No cervical adenopathy LUNGS:  Clear to auscultation bilaterally BACK:  No CVA tenderness CHEST:  Unremarkable HEART:  S1 and S2 within normal limits, no S3, no S4, no clicks, no rubs, 3 out of 6 apical systolic murmur holosystolic and heard at the apex, no diastolic murmurs ABD:  Positive bowel sounds normal in  frequency in pitch, no bruits, no rebound, no guarding, unable to assess midline mass or bruit with the patient seated. EXT:  2 plus pulses throughout, moderate edema, no cyanosis no clubbing   EKG:  EKG is *** ordered today. ***   Recent Labs: Lab Results  Component Value Date   TSH 1.527 02/18/2021   ALT 67 (H) 02/17/2021   AST 44 (H) 02/17/2021   ALKPHOS 73 02/17/2021   BILITOT 1.7 (H) 02/17/2021  PROT 6.8 02/17/2021   ALBUMIN 4.0 02/17/2021    Lipid Panel    Component Value Date/Time   CHOL 135 10/31/2015 1202   TRIG 55 10/31/2015 1202   HDL 96 10/31/2015 1202   CHOLHDL 1.4 10/31/2015 1202   VLDL 11 10/31/2015 1202   LDLCALC 28 10/31/2015 1202      Wt Readings from Last 3 Encounters:  02/20/21 98 lb 1.7 oz (44.5 kg)  01/27/21 103 lb (46.7 kg)  12/16/20 105 lb 3.2 oz (47.7 kg)      Other studies Reviewed: Additional studies/ records that were reviewed today include:  *** Review of the above records demonstrates: See elsewhere   ASSESSMENT AND PLAN:  HTN:   The blood pressure is *** Blood pressures controlled.  No change in therapy.  Acute on chronic systolic and diastolic heart failure:   *** She seems to be euvolemic with a lower dose of Lasix and we talked about as needed dosing of the 40 mg p.m.   Tachybrady syndrome:    *** She is now off the amiodarone.  No change in therapy.  She tolerates anticoagulation.   Current medicines are reviewed at length with the patient today.  No change  The following changes have been made:   ***  Labs/ tests ordered today include:   ***  No orders of the defined types were placed in this encounter.    Disposition:   FU with me in ***   Signed, Minus Breeding, MD  03/12/2021 7:54 PM    [facility]

## 2021-03-13 ENCOUNTER — Telehealth: Payer: Self-pay | Admitting: Physician Assistant

## 2021-03-13 ENCOUNTER — Ambulatory Visit: Payer: Medicare Other | Admitting: Cardiology

## 2021-03-13 NOTE — Telephone Encounter (Signed)
Dtr called because pt there for appt and the power was out.  Advised them to go home.  I will route this note to Dr Percival Spanish and Kelli Churn, RN to get her rescheduled.  Pt and dtr are ok w/ this plan.  Rosaria Ferries, PA-C 03/13/2021 2:14 PM

## 2021-03-14 ENCOUNTER — Other Ambulatory Visit: Payer: Self-pay | Admitting: Pharmacist

## 2021-03-14 MED ORDER — TORSEMIDE 20 MG PO TABS: 40.0000 mg | ORAL_TABLET | Freq: Every day | ORAL | 1 refills | Status: DC

## 2021-03-14 MED ORDER — METOPROLOL SUCCINATE ER 50 MG PO TB24: 50.0000 mg | ORAL_TABLET | Freq: Every day | ORAL | 1 refills | Status: DC

## 2021-03-14 MED ORDER — PANTOPRAZOLE SODIUM 40 MG PO TBEC: 40.0000 mg | DELAYED_RELEASE_TABLET | Freq: Every day | ORAL | 1 refills | Status: DC

## 2021-03-14 NOTE — Telephone Encounter (Signed)
Patient scheduled to see Dr Percival Spanish on June/30/2022. Visit cancelled due to power outage.   Talked to daughter and she requested enough medication until new follow up visit can be rescheduled.  Needs refill from medication started during last hospital admission.  Rx for pantoprazole, torsemide, and metoprolol sent to prefr pharmacy. Patient stated they she has enough warfarin, potassium and atorvastatin for now.

## 2021-03-15 ENCOUNTER — Other Ambulatory Visit: Payer: Self-pay | Admitting: Cardiology

## 2021-03-19 ENCOUNTER — Ambulatory Visit (INDEPENDENT_AMBULATORY_CARE_PROVIDER_SITE_OTHER): Payer: Medicare Other | Admitting: Pharmacist

## 2021-03-19 ENCOUNTER — Other Ambulatory Visit (HOSPITAL_BASED_OUTPATIENT_CLINIC_OR_DEPARTMENT_OTHER): Payer: Self-pay

## 2021-03-19 DIAGNOSIS — I4891 Unspecified atrial fibrillation: Secondary | ICD-10-CM | POA: Diagnosis not present

## 2021-03-19 DIAGNOSIS — Z5181 Encounter for therapeutic drug level monitoring: Secondary | ICD-10-CM | POA: Diagnosis not present

## 2021-03-19 LAB — POCT INR: INR: 1.9 — AB (ref 2.0–3.0)

## 2021-03-20 ENCOUNTER — Other Ambulatory Visit: Payer: Self-pay

## 2021-03-26 NOTE — Progress Notes (Signed)
Remote pacemaker transmission.   

## 2021-04-04 ENCOUNTER — Ambulatory Visit (INDEPENDENT_AMBULATORY_CARE_PROVIDER_SITE_OTHER): Payer: Medicare Other

## 2021-04-04 DIAGNOSIS — I4891 Unspecified atrial fibrillation: Secondary | ICD-10-CM

## 2021-04-04 DIAGNOSIS — Z5181 Encounter for therapeutic drug level monitoring: Secondary | ICD-10-CM | POA: Diagnosis not present

## 2021-04-04 LAB — POCT INR: INR: 1.9 — AB (ref 2.0–3.0)

## 2021-04-04 NOTE — Patient Instructions (Signed)
Description   Called spoke with Gerald Stabs from [facility] while in pt's home- advised patient to start taking 1 tablet daily ('2mg'$ ) except 1.5 tablets ('3mg'$ ) on Fridays.  Will have [facility] recheck INR in 1 week, gave verbal order.  [place] [phone] Call our office if you have any concerns [phone].

## 2021-04-11 ENCOUNTER — Ambulatory Visit (INDEPENDENT_AMBULATORY_CARE_PROVIDER_SITE_OTHER): Payer: Medicare Other

## 2021-04-11 DIAGNOSIS — Z5181 Encounter for therapeutic drug level monitoring: Secondary | ICD-10-CM | POA: Diagnosis not present

## 2021-04-11 DIAGNOSIS — I4891 Unspecified atrial fibrillation: Secondary | ICD-10-CM | POA: Diagnosis not present

## 2021-04-11 LAB — POCT INR: INR: 2.9 (ref 2.0–3.0)

## 2021-04-11 NOTE — Patient Instructions (Addendum)
Spoke with Gerald Stabs from [facility] while in pt's home- advised patient to continue taking 1 tablet daily ('2mg'$ ).  Will have [facility] recheck INR in 1 week, gave verbal order.  [place] [phone] Call our office if you have any concerns [phone].  *04/11/21 - per Gerald Stabs, this may be pt's last home visit - pt to call if she needs in-person visit

## 2021-04-16 ENCOUNTER — Other Ambulatory Visit: Payer: Self-pay | Admitting: Cardiology

## 2021-04-16 ENCOUNTER — Ambulatory Visit (INDEPENDENT_AMBULATORY_CARE_PROVIDER_SITE_OTHER): Payer: Medicare Other | Admitting: *Deleted

## 2021-04-16 DIAGNOSIS — Z5181 Encounter for therapeutic drug level monitoring: Secondary | ICD-10-CM | POA: Diagnosis not present

## 2021-04-16 DIAGNOSIS — I4891 Unspecified atrial fibrillation: Secondary | ICD-10-CM

## 2021-04-16 LAB — POCT INR: INR: 1.9 — AB (ref 2.0–3.0)

## 2021-04-16 NOTE — Patient Instructions (Addendum)
Description   Spoke with Aaron Edelman from [facility] while in pt's home- advised patient to take 1.5 tablets  ('3mg'$ ) today then continue taking 1 tablet daily ('2mg'$ ).  Will have [facility] recheck INR in 1 week, gave verbal order.  [place] [phone] Call our office if you have any concerns [phone].  *04/16/21 - per Aaron Edelman, this may be pt's last home visit - pt to call if she needs in-person visit

## 2021-04-23 ENCOUNTER — Ambulatory Visit (INDEPENDENT_AMBULATORY_CARE_PROVIDER_SITE_OTHER): Payer: Medicare Other

## 2021-04-23 DIAGNOSIS — I4891 Unspecified atrial fibrillation: Secondary | ICD-10-CM

## 2021-04-23 DIAGNOSIS — Z5181 Encounter for therapeutic drug level monitoring: Secondary | ICD-10-CM

## 2021-04-23 LAB — POCT INR: INR: 1.7 — AB (ref 2.0–3.0)

## 2021-04-23 NOTE — Patient Instructions (Signed)
Description   Spoke with Aaron Edelman from [facility] while in pt's home- advised patient to take 1.5 tablets  ('3mg'$ ) today then start taking 1 tablet daily ('2mg'$ ) except 1.5 tablets ('3mg'$ )on Mondays. Recheck in 2 weeks in office [facility] discharging pt today, made appt while on the phone.  Call our office if you have any concerns [phone].

## 2021-05-01 ENCOUNTER — Telehealth: Payer: Self-pay | Admitting: Cardiology

## 2021-05-01 NOTE — Telephone Encounter (Signed)
Called patient's daughter [name] (Madaline Brilliant per DPR), pt's daughter reports the patient did have an appointment tomorrow 8/19, however there was a conflict and they are not able to make the appointment. Pt's daughter would like to reschedule pt. Pt's daughter would also like to ask if this visit can be virtual. [name] reports her mother continues to have occasional shortness of breath with movement, which she reports is a chronic issue, but denies any new symptoms or chest pain, dizziness, or swelling. Scheduled pt for 07/03/2021 at 11:00am with Dr. Percival Spanish. Advised [name] if pt develops any concerning symptoms such as worsening shortness of breath, chest pain, syncope, or dizziness, she should call 911 or take her to the emergency room. Advised pt her message would be sent to Dr. Percival Spanish to see if visit can be virtual. Pt's daughter verbalized understanding.   Pt's daughter also was wondering if her mother's coumadin checks could be done by home health. Advised pt's daughter message would be routed to coumadin clinic for review.

## 2021-05-01 NOTE — Telephone Encounter (Signed)
New Message:      Patient's daughter would like a call please. Patient have an appointment tomorrow and she wants to know if it can be a Virtual Visit and she have some more questions.

## 2021-05-01 NOTE — Telephone Encounter (Signed)
Spoke with daughter - will keep INR check for next Wednesday for now.  If pt not feeling well at that time they will change appt.

## 2021-05-02 ENCOUNTER — Ambulatory Visit: Payer: Medicare Other | Admitting: Cardiology

## 2021-05-02 NOTE — Telephone Encounter (Signed)
Called pt's daughter [name] (ok per DPR), relayed the following from Dr. Percival Spanish:  Minus Breeding, MD to Me     7:55 PM I am fine if she wants to be virtual.     Pt's daughter verbalized understanding.

## 2021-05-05 ENCOUNTER — Telehealth: Payer: Self-pay | Admitting: *Deleted

## 2021-05-05 NOTE — Telephone Encounter (Signed)
Daughter left a voicemail to call back regarding PCP office in [place] checking pt's INR. Returned a call to the dtr, [name], and she stated her mom is requiring more assistance & using oxygen. She states that since she is going to her PCP office on 05/12/21 at 2pm and states they have offered to draw her INR, advised that would be fine and they could fax to [place]. I gave her the fax number and advised to give them our direct number to call us. She wrote everything down and was grateful for assistance. Moved appt to 05/12/21 so that we ensure to follow up and once we receive INR will cancel appt & made doctor aware.

## 2021-05-26 ENCOUNTER — Ambulatory Visit (INDEPENDENT_AMBULATORY_CARE_PROVIDER_SITE_OTHER): Payer: Medicare Other

## 2021-05-26 DIAGNOSIS — I4891 Unspecified atrial fibrillation: Secondary | ICD-10-CM | POA: Diagnosis not present

## 2021-05-26 DIAGNOSIS — Z5181 Encounter for therapeutic drug level monitoring: Secondary | ICD-10-CM

## 2021-05-26 LAB — POCT INR: INR: 2.8 (ref 2.0–3.0)

## 2021-05-26 NOTE — Patient Instructions (Signed)
Spoke with Aaron Edelman from [facility] while in pt's home - she was discharged from hospital on new dosage of warfarin- advised patient to  continue dosage 2.5 mg every day. (Pt has 2 mg and 1 mg tablets on hand) Recheck in 1 week.   Call our office if you have any concerns [phone].

## 2021-06-02 ENCOUNTER — Ambulatory Visit (INDEPENDENT_AMBULATORY_CARE_PROVIDER_SITE_OTHER): Payer: Medicare Other | Admitting: *Deleted

## 2021-06-02 DIAGNOSIS — I4891 Unspecified atrial fibrillation: Secondary | ICD-10-CM

## 2021-06-02 DIAGNOSIS — Z5181 Encounter for therapeutic drug level monitoring: Secondary | ICD-10-CM | POA: Diagnosis not present

## 2021-06-02 LAB — POCT INR: INR: 7 — AB (ref 2.0–3.0)

## 2021-06-02 LAB — PROTIME-INR: INR: 5.7 — AB (ref <=1.1)

## 2021-06-02 NOTE — Patient Instructions (Addendum)
Description   Spoke with Theadora Rama from [facility] and advised to obtain a STAT INR and hold warfarin until INR resulted. At 457pm-spoke with [facility] and instructed pt to hold Warfarin today, hold Warfarin tomorrow, hold Warfarin Wednesday, then start taking '2mg'$  daily except '3mg'$  on Mondays (dose prior to hospitalization). Recheck INR on Monday. Call our office if you have any concerns [phone].

## 2021-06-09 ENCOUNTER — Telehealth: Payer: Self-pay | Admitting: Cardiology

## 2021-06-09 LAB — PROTIME-INR: INR: 5.1 — AB (ref 0.9–1.1)

## 2021-06-09 NOTE — Telephone Encounter (Signed)
Pts home health nurse states that she was not able to do the finger stick for the pt so she did a blood draw and results should be in within the hour.

## 2021-06-10 ENCOUNTER — Ambulatory Visit: Payer: Self-pay

## 2021-06-10 DIAGNOSIS — Z5181 Encounter for therapeutic drug level monitoring: Secondary | ICD-10-CM

## 2021-06-10 DIAGNOSIS — I4891 Unspecified atrial fibrillation: Secondary | ICD-10-CM

## 2021-06-14 NOTE — Telephone Encounter (Signed)
Called spoke with Claudie Fisherman, RN requesting PT/INR results.  She states she was off yesterday, and results did not get faxed out, but has the results INR 5.09/PT 61.5 and she has faxed results to main number.  Requested she refax results to [phone]. See anticoagulation note in Epic for results and dosage instructions.

## 2021-06-14 DEATH — deceased

## 2021-07-03 ENCOUNTER — Ambulatory Visit: Payer: Medicare Other | Admitting: Cardiology

## 2022-02-21 IMAGING — CR DG CHEST 2V
2 series · 2 of 2 positions shown · non-contrast
Comparison: December 29, 2019.

CLINICAL DATA: Shortness of breath.

EXAM:
CHEST - 2 VIEW

[chest lat]
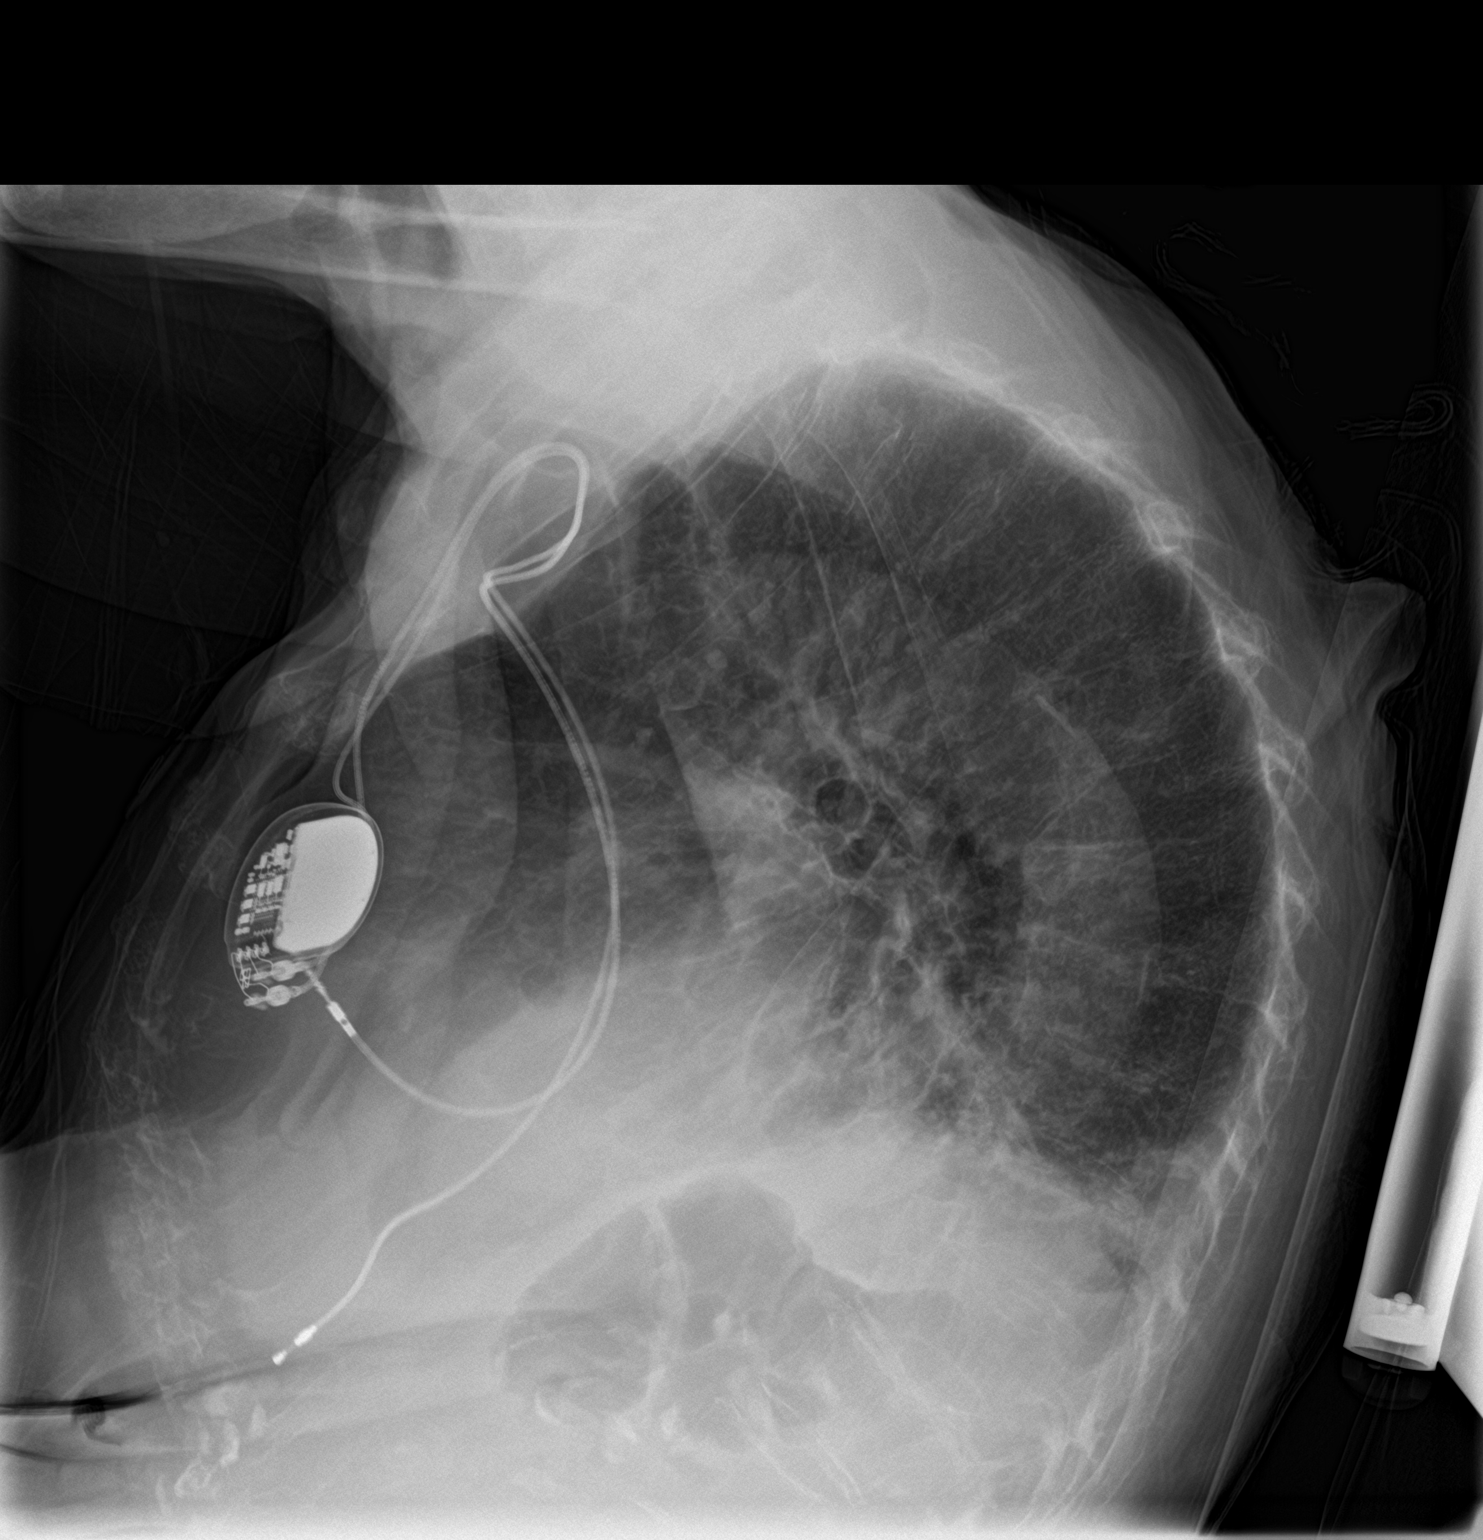

[chest ap]
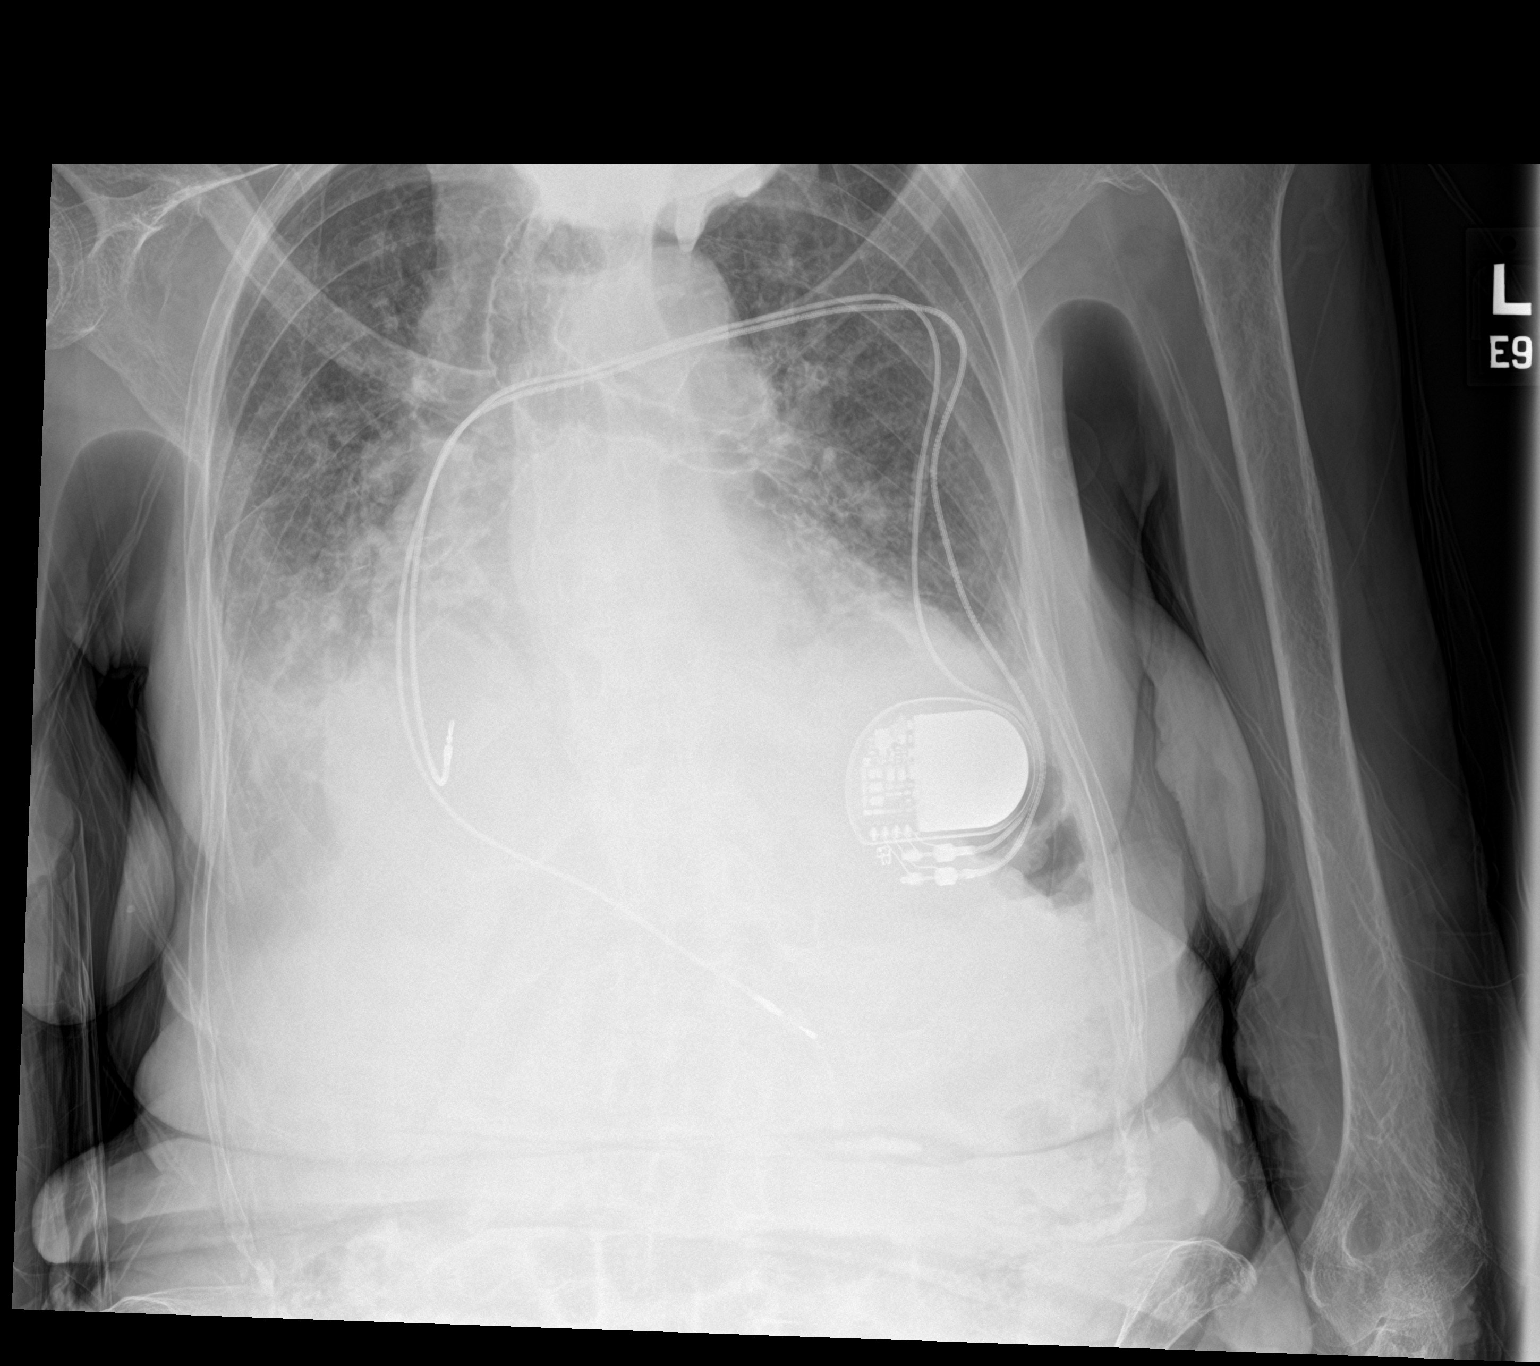

[2 of 2 positions shown; findings below may reference images not displayed]

FINDINGS: Stable cardiomegaly with bilateral perihilar and basilar opacities
concerning for pulmonary edema. Stable right pleural effusion noted.
No pneumothorax is noted. Left-sided pacemaker is unchanged in
position.
IMPRESSION: Stable cardiomegaly with bilateral perihilar and basilar opacities
concerning for pulmonary edema and associated right pleural
effusion.

Aortic Atherosclerosis ([id number]).

## 2022-02-23 IMAGING — DX DG CHEST 1V PORT
1 series · 1 of 1 positions shown · non-contrast
Comparison: 02/17/2021

CLINICAL DATA: Shortness of breath

EXAM:
PORTABLE CHEST 1 VIEW

[chest]
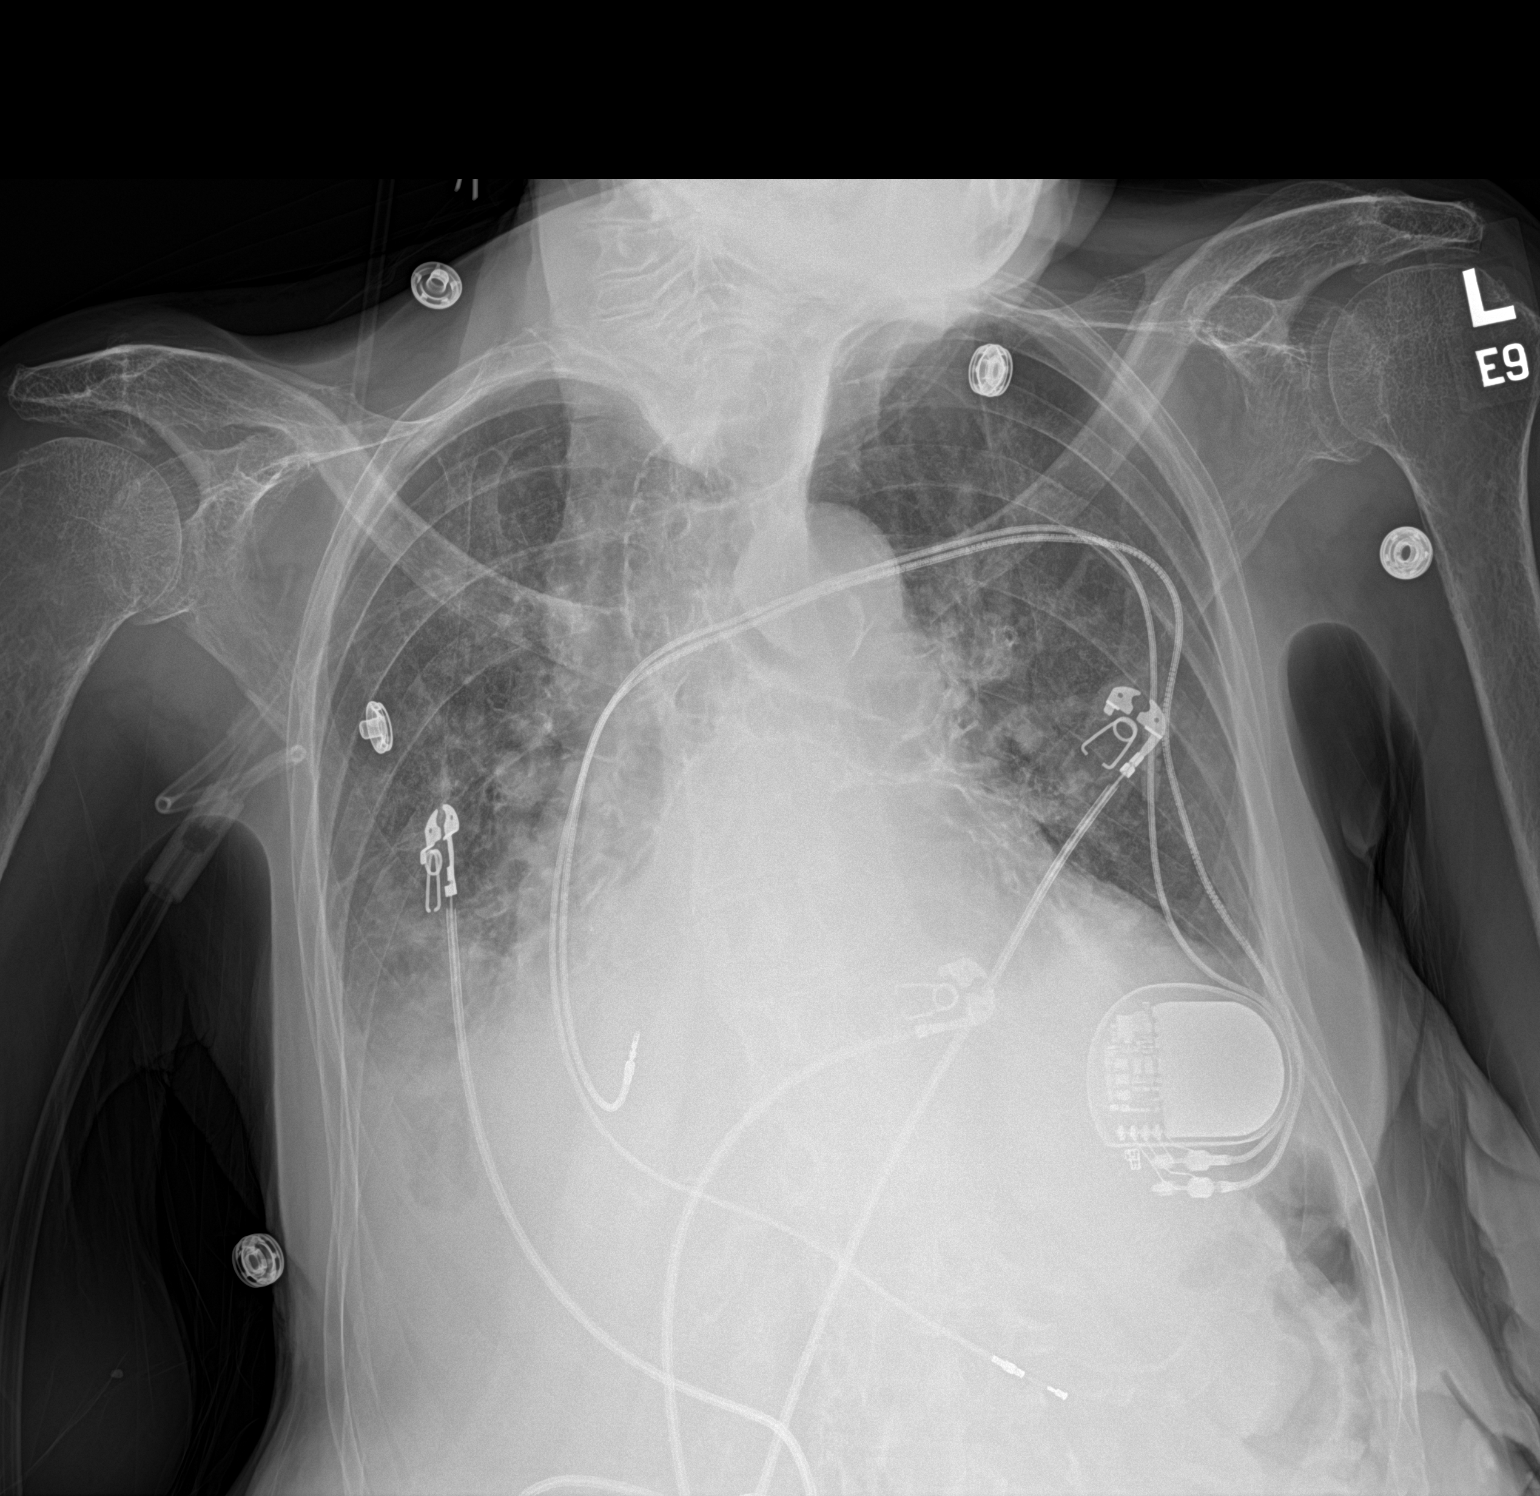

[1 of 1 positions shown; findings below may reference images not displayed]

FINDINGS: Left pacer remains in place, unchanged. Cardiomegaly, vascular
congestion and bilateral airspace disease, right greater than left.
Stable layering right pleural effusion. No acute bony abnormality.
IMPRESSION: Cardiomegaly. Stable bilateral airspace disease with right effusion.
Findings likely reflect edema/CHF. No real change.

## 2022-10-12 ENCOUNTER — Other Ambulatory Visit (HOSPITAL_COMMUNITY): Payer: Self-pay
